# Patient Record
Sex: Male | Born: 1955 | Race: Black or African American | Hispanic: No | Marital: Single | Smoking: Former smoker
Health system: Southern US, Community
[De-identification: ages and names within clinical notes are randomized; demographics above are authoritative.]

## PROBLEM LIST (undated history)

## (undated) DIAGNOSIS — J189 Pneumonia, unspecified organism: Secondary | ICD-10-CM

## (undated) DIAGNOSIS — R0600 Dyspnea, unspecified: Secondary | ICD-10-CM

## (undated) DIAGNOSIS — I10 Essential (primary) hypertension: Secondary | ICD-10-CM

## (undated) DIAGNOSIS — C801 Malignant (primary) neoplasm, unspecified: Secondary | ICD-10-CM

## (undated) DIAGNOSIS — N189 Chronic kidney disease, unspecified: Secondary | ICD-10-CM

## (undated) DIAGNOSIS — M1990 Unspecified osteoarthritis, unspecified site: Secondary | ICD-10-CM

## (undated) HISTORY — PX: COLONOSCOPY W/ POLYPECTOMY: SHX1380

## (undated) HISTORY — PX: NASAL FRACTURE SURGERY: SHX718

---

## 2011-05-25 ENCOUNTER — Encounter (HOSPITAL_BASED_OUTPATIENT_CLINIC_OR_DEPARTMENT_OTHER): Payer: Self-pay | Admitting: *Deleted

## 2011-05-25 ENCOUNTER — Emergency Department (HOSPITAL_BASED_OUTPATIENT_CLINIC_OR_DEPARTMENT_OTHER)
Admission: EM | Admit: 2011-05-25 | Discharge: 2011-05-25 | Disposition: A | Discharge status: Home Health | Payer: 59 | Attending: Emergency Medicine | Admitting: Emergency Medicine

## 2011-05-25 ENCOUNTER — Emergency Department (INDEPENDENT_AMBULATORY_CARE_PROVIDER_SITE_OTHER): Payer: 59

## 2011-05-25 ENCOUNTER — Other Ambulatory Visit: Payer: Self-pay

## 2011-05-25 DIAGNOSIS — R079 Chest pain, unspecified: Secondary | ICD-10-CM

## 2011-05-25 DIAGNOSIS — I10 Essential (primary) hypertension: Secondary | ICD-10-CM | POA: Insufficient documentation

## 2011-05-25 DIAGNOSIS — E119 Type 2 diabetes mellitus without complications: Secondary | ICD-10-CM | POA: Insufficient documentation

## 2011-05-25 DIAGNOSIS — K029 Dental caries, unspecified: Secondary | ICD-10-CM | POA: Insufficient documentation

## 2011-05-25 DIAGNOSIS — M25519 Pain in unspecified shoulder: Secondary | ICD-10-CM | POA: Insufficient documentation

## 2011-05-25 DIAGNOSIS — Z79899 Other long term (current) drug therapy: Secondary | ICD-10-CM | POA: Insufficient documentation

## 2011-05-25 DIAGNOSIS — H11419 Vascular abnormalities of conjunctiva, unspecified eye: Secondary | ICD-10-CM | POA: Insufficient documentation

## 2011-05-25 HISTORY — DX: Essential (primary) hypertension: I10

## 2011-05-25 LAB — COMPREHENSIVE METABOLIC PANEL WITH GFR
ALT: 15 U/L (ref 0–53)
AST: 10 U/L (ref 0–37)
Albumin: 3.8 g/dL (ref 3.5–5.2)
Alkaline Phosphatase: 109 U/L (ref 39–117)
BUN: 10 mg/dL (ref 6–23)
CO2: 23 meq/L (ref 19–32)
Calcium: 9.5 mg/dL (ref 8.4–10.5)
Chloride: 100 meq/L (ref 96–112)
Creatinine, Ser: 0.9 mg/dL (ref 0.50–1.35)
GFR calc Af Amer: >90 mL/min
GFR calc non Af Amer: >90 mL/min
Glucose, Bld: 360 mg/dL — ABNORMAL HIGH (ref 70–99)
Potassium: 4.1 meq/L (ref 3.5–5.1)
Sodium: 134 meq/L — ABNORMAL LOW (ref 135–145)
Total Bilirubin: 0.3 mg/dL (ref 0.3–1.2)
Total Protein: 7 g/dL (ref 6.0–8.3)

## 2011-05-25 LAB — PROTIME-INR: INR: 0.91 (ref 0.00–1.49)

## 2011-05-25 LAB — CBC
HCT: 37.5 % — ABNORMAL LOW (ref 39.0–52.0)
Hemoglobin: 12.9 g/dL — ABNORMAL LOW (ref 13.0–17.0)
MCHC: 34.4 g/dL (ref 30.0–36.0)
MCV: 87.8 fL (ref 78.0–100.0)

## 2011-05-25 LAB — TROPONIN I: Troponin I: <0.3 ng/mL (ref <0.30)

## 2011-05-25 MED ORDER — LISINOPRIL 20 MG PO TABS: 10.0000 mg | ORAL_TABLET | Freq: Every day | ORAL | Status: DC

## 2011-05-25 MED ORDER — ASPIRIN 81 MG PO CHEW
324.0000 mg | CHEWABLE_TABLET | Freq: Once | ORAL | Status: AC
  Administered 2011-05-25: 324 mg via ORAL
  Filled 2011-05-25: qty 4

## 2011-05-25 MED ORDER — PENICILLIN V POTASSIUM 250 MG PO TABS
500.0000 mg | ORAL_TABLET | Freq: Four times a day (QID) | ORAL | Status: DC
  Administered 2011-05-25: 500 mg via ORAL
  Filled 2011-05-25: qty 2

## 2011-05-25 MED ORDER — SODIUM CHLORIDE 0.9 % IV BOLUS (SEPSIS)
1000.0000 mL | Freq: Once | INTRAVENOUS | Status: AC
  Administered 2011-05-25: 1000 mL via INTRAVENOUS

## 2011-05-25 MED ORDER — PENICILLIN V POTASSIUM 500 MG PO TABS: 500.0000 mg | ORAL_TABLET | Freq: Four times a day (QID) | ORAL | Status: AC

## 2011-05-25 MED ORDER — HYDROCODONE-ACETAMINOPHEN 5-325 MG PO TABS
2.0000 | ORAL_TABLET | Freq: Once | ORAL | Status: AC
  Administered 2011-05-25: 2 via ORAL
  Filled 2011-05-25: qty 2

## 2011-05-25 MED ORDER — IBUPROFEN 800 MG PO TABS
800.0000 mg | ORAL_TABLET | Freq: Once | ORAL | Status: AC
  Administered 2011-05-25: 800 mg via ORAL
  Filled 2011-05-25: qty 1

## 2011-05-25 MED ORDER — HYDROCODONE-ACETAMINOPHEN 5-325 MG PO TABS: 2.0000 | ORAL_TABLET | Freq: Four times a day (QID) | ORAL | Status: AC | PRN

## 2011-05-25 MED ORDER — METFORMIN HCL 500 MG PO TABS: 500.0000 mg | ORAL_TABLET | Freq: Every day | ORAL | Status: DC

## 2011-05-25 NOTE — ED Notes (Signed)
Patient states while on the way to work at 5:30 and started experiencing L sided chest pain, stayed in car lying down, has been taking ibuprofen for tooth pain and fell asleep in car and woke up with continuing pain, no cardiac Hx

## 2011-05-25 NOTE — Discharge Instructions (Signed)
Chest Pain (Nonspecific) It is often hard to give a specific diagnosis for the cause of chest pain. There is always a chance that your pain could be related to something serious, such as a heart attack or a blood clot in the lungs. You need to follow up with your caregiver for further evaluation. CAUSES   Heartburn.   Pneumonia or bronchitis.   Anxiety and stress.   Inflammation around your heart (pericarditis) or lung (pleuritis or pleurisy).   A blood clot in the lung.   A collapsed lung (pneumothorax). It can develop suddenly on its own (spontaneous pneumothorax) or from injury (trauma) to the chest.  The chest wall is composed of bones, muscles, and cartilage. Any of these can be the source of the pain.  The bones can be bruised by injury.   The muscles or cartilage can be strained by coughing or overwork.   The cartilage can be affected by inflammation and become sore (costochondritis).  DIAGNOSIS  Lab tests or other studies, such as X-rays, an EKG, stress testing, or cardiac imaging, may be needed to find the cause of your pain.  TREATMENT   Treatment depends on what may be causing your chest pain. Treatment may include:   Acid blockers for heartburn.   Anti-inflammatory medicine.   Pain medicine for inflammatory conditions.   Antibiotics if an infection is present.   You may be advised to change lifestyle habits. This includes stopping smoking and avoiding caffeine and chocolate.   You may be advised to keep your head raised (elevated) when sleeping. This reduces the chance of acid going backward from your stomach into your esophagus.   Most of the time, nonspecific chest pain will improve within 2 to 3 days with rest and mild pain medicine.  HOME CARE INSTRUCTIONS   If antibiotics were prescribed, take the full amount even if you start to feel better.   For the next few days, avoid physical activities that bring on chest pain. Continue physical activities as  directed.   Do not smoke cigarettes or drink alcohol until your symptoms are gone.   Only take over-the-counter or prescription medicine for pain, discomfort, or fever as directed by your caregiver.   Follow your caregiver's suggestions for further testing if your chest pain does not go away.   Keep any follow-up appointments you made. If you do not go to an appointment, you could develop lasting (chronic) problems with pain. If there is any problem keeping an appointment, you must call to reschedule.  SEEK MEDICAL CARE IF:   You think you are having problems from the medicine you are taking. Read your medicine instructions carefully.   Your chest pain does not go away, even after treatment.   You develop a rash with blisters on your chest.  SEEK IMMEDIATE MEDICAL CARE IF:   You have increased chest pain or pain that spreads to your arm, neck, jaw, back, or belly (abdomen).   You develop shortness of breath, an increasing cough, or you are coughing up blood.   You have severe back or abdominal pain, feel sick to your stomach (nauseous) or throw up (vomit).   You develop severe weakness, fainting, or chills.   You have an oral temperature above 102 F (38.9 C), not controlled by medicine.  THIS IS AN EMERGENCY. Do not wait to see if the pain will go away. Get medical help at once. Call your local emergency services (911 in [place] Do not drive yourself to   the hospital. MAKE SURE YOU:   Understand these instructions.   Will watch your condition.   Will get help right away if you are not doing well or get worse.  Document Released: 12/19/2004 Document Revised: 11/21/2010 Document Reviewed: 10/15/2007 [facility] Patient Information 2012 [place], [place].Dental Caries  Tooth decay (dental caries, cavities) is the most common of all oral diseases. It occurs in all ages but is more common in children and young adults.  CAUSES  Bacteria in your mouth combine with foods (particularly  sugars and starches) to produce plaque. Plaque is a substance that sticks to the hard surfaces of teeth. The bacteria in the plaque produce acids that attack the enamel of teeth. Repeated acid attacks dissolve the enamel and create holes in the teeth. Root surfaces of teeth may also get these holes.  Other contributing factors include:  Frequent snacking and drinking of cavity-producing foods and liquids.  Poor oral hygiene.  Dry mouth.  Substance abuse such as methamphetamine.  Broken or poor fitting dental restorations.  Eating disorders.  Gastroesophageal reflux disease (GERD).  Certain radiation treatments to the head and neck.  SYMPTOMS  At first, dental decay appears as white, chalky areas on the enamel. In this early stage, symptoms are seldom present. As the decay progresses, pits and holes may appear on the enamel surfaces. Progression of the decay will lead to softening of the hard layers of the tooth. At this point you may experience some pain or achy feeling after sweet, hot, or cold foods or drinks are consumed. If left untreated, the decay will reach the internal structures of the tooth and produce severe pain. Extensive dental treatment, such as root canal therapy, may be needed to save the tooth at this late stage of decay development.  DIAGNOSIS  Most cavities will be detected during regular check-ups. A thorough medical and dental history will be taken by the dentist. The dentist will use instruments to check the surfaces of your teeth for any breakdown or discoloration. Some dentists have special instruments, such as lasers, that detect tooth decay. Dental X-rays may also show some cavities that are not visible to the eye (such as between the contact areas of the teeth). TREATMENT  Treatment involves removal of the tooth decay and replacement with a restorative material such as silver, gold, or composite (white) material. However, if the decay involves a large area of the tooth and  there is little remaining healthy tooth structure, a cap (crown) will be fitted over the remaining structure. If the decay involves the center part of the tooth (pulp), root canal treatment will be needed before any type of dental restoration is placed. If the tooth is severely destroyed by the decay process, leaving the remaining tooth structures unrestorable, the tooth will need to be pulled (extracted). Some early tooth decay may be reversed by fluoride treatments and thorough brushing and flossing at home. PREVENTION  Eat healthy foods. Restrict the amount of sugary, starchy foods and liquids you consume. Avoid frequent snacking and drinking of unhealthy foods and liquids.  Sealants can help with prevention of cavities. Sealants are composite resins applied onto the biting surfaces of teeth at risk for decay. They smooth out the pits and grooves and prevent food from being trapped in them. This is done in early childhood before tooth decay has started.  Fluoride tablets may also be prescribed to children between 6 months and 29 years of age if your drinking water is not fluoridated. The fluoride absorbed  by the tooth enamel makes teeth less susceptible to decay. Thorough daily cleaning with a toothbrush and dental floss is the best way to prevent cavities. Use of a fluoride toothpaste is highly recommended. Fluoride mouth rinses may be used in specific cases.  Topical application of fluoride by your dentist is important in children.  Regular visits with a dentist for checkups and cleanings are also important.  SEEK IMMEDIATE DENTAL CARE IF: You have a fever.  You develop redness and swelling of your face, jaw, or neck.  You develop swelling around a tooth.  You are unable to open your mouth or cannot swallow.  You have severe pain uncontrolled by pain medicine.  Document Released: 12/01/2001 Document Revised: 11/21/2010 Document Reviewed: 08/16/2010 [facility] Patient Information 2012 [place],  [place].

## 2011-05-25 NOTE — ED Provider Notes (Signed)
History     CSN: [id number]  Arrival date & time 05/25/11  0820   First MD Initiated Contact with Patient 05/25/11 0830      Chief Complaint  Patient presents with  . Chest Pain    (Consider location/radiation/quality/duration/timing/severity/associated sxs/prior treatment) HPI Patient is a 56 year old male who presents today complaining of several seconds of left-sided chest pain. He also describes some left shoulder pain that is worsening and with arm movement. Patient has no pain currently but says when he has the pain in his chest a 10 out of 10. He has a history of diabetes and hypertension but no coronary artery disease. Patient also reports some history of family coronary artery disease but nothing before the age of 64. Patient denies any history of DVT or PE and has no significant risk factors for this. Patient denies any substance abuse including cocaine use. Patient does not have any alleviating or modifying factors of the pain in the left side of his chest. He was concerned about the should be checked out today. It began while he was driving to work. The patient reports that he took a nap in the parking lot at work. When he awoke he had another 2-3 second episode. There are no other associated or modifying factors. Past Medical History  Diagnosis Date  . Hypertension   . Diabetes mellitus     History reviewed. No pertinent past surgical history.  No family history on file.  History  Substance Use Topics  . Smoking status: Current Everyday Smoker  . Smokeless tobacco: Not on file  . Alcohol Use: Yes      Review of Systems  Constitutional: Negative.   HENT: Negative.   Eyes: Negative.   Respiratory: Negative.   Cardiovascular: Positive for chest pain.  Gastrointestinal: Negative.   Genitourinary: Negative.   Musculoskeletal:       Left shoulder pain  Skin: Negative.   Neurological: Negative.   Hematological: Negative.   Psychiatric/Behavioral: Negative.   All  other systems reviewed and are negative.    Allergies  Review of patient's allergies indicates no known allergies.  Home Medications   Current Outpatient Rx  Name Route Sig Dispense Refill  . LISINOPRIL 10 MG PO TABS Oral Take 10 mg by mouth daily.    Marland Kitchen METFORMIN HCL PO Oral Take 500 mg by mouth every evening.     Marland Kitchen UNKNOWN TO PATIENT  hypertension    . HYDROCODONE-ACETAMINOPHEN 5-325 MG PO TABS Oral Take 2 tablets by mouth every 6 (six) hours as needed for pain. 15 tablet 0  . LISINOPRIL 20 MG PO TABS Oral Take 0.5 tablets (10 mg total) by mouth daily. 30 tablet 0  . METFORMIN HCL 500 MG PO TABS Oral Take 1 tablet (500 mg total) by mouth daily with breakfast. 30 tablet 0  . PENICILLIN V POTASSIUM 500 MG PO TABS Oral Take 1 tablet (500 mg total) by mouth 4 (four) times daily. 28 tablet 0    BP 123/66  Pulse 68  Temp(Src) 97.8 F (36.6 C) (Oral)  Resp 18  SpO2 97%  Physical Exam  Nursing note and vitals reviewed. GEN: Well-developed, well-nourished male in no distress HEENT: Atraumatic, normocephalic. Oropharynx clear without erythema EYES: PERRLA BL, no scleral icterus. Scleral injection bilaterally NECK: Trachea midline, no meningismus CV: regular rate and rhythm. No murmurs, rubs, or gallops PULM: No respiratory distress.  No crackles, wheezes, or rales. GI: soft, non-tender. No guarding, rebound, or tenderness. + bowel sounds  Neuro: cranial nerves 2-12 intact, no abnormalities of strength or sensation, A and O x 3 MSK: Patient moves all 4 extremities symmetrically, no deformity, edema, or injury noted Psych: no abnormality of mood   ED Course  Procedures (including critical care time)    Date: 05/25/2011  Rate: 66  Rhythm: normal sinus rhythm  QRS Axis: normal  Intervals: normal  ST/T Wave abnormalities: normal  Conduction Disutrbances: none  Narrative Interpretation:   Old EKG Reviewed: No old EKG available     Labs Reviewed  COMPREHENSIVE METABOLIC  PANEL - Abnormal; Notable for the following:    Sodium 134 (*)    Glucose, Bld 360 (*)    All other components within normal limits  CBC - Abnormal; Notable for the following:    Hemoglobin 12.9 (*)    HCT 37.5 (*)    All other components within normal limits  PROTIME-INR  TROPONIN I  TROPONIN I   Dg Chest 2 View  05/25/2011  *RADIOLOGY REPORT*  Clinical Data: Left chest pain  CHEST - 2 VIEW  Comparison: None.  Findings: Lungs clear.  Heart size and pulmonary vascularity normal.  No effusion.  Visualized bones unremarkable.  IMPRESSION: No acute disease  Original Report Authenticated By: Thora Lance III, M.D.     1. Chest pain   2. Dental caries       MDM  Patient was evaluated by myself. Patient had atypical chest pain that lasted only for a few seconds. Patient does have a history of diabetes as well as hypertension. Patient did admit that he has not been following with her primary care physician and he has not been taking his medications as he should. He was slightly hyperglycemic. Patient received 1 L normal saline IV bolus for this. Patient's workup for ACS including CBC, renal panel, chest x-ray, EKG, and 0 and 3 hour troponins was unremarkable. Had no concern for thromboembolic disease today. Additionally patient did not have significant risk factors for this. Patient was given aspirin while his workup is completed. Patient also complained of dental pain. He did receive penicillin for this as well as 2 tabs of Vicodin. Patient was given the referring dentist name as well as instructions to call within 48 hours. Patient was also told that he needed to obtain a primary care physician and take his lisinopril and metformin as prescribed on a regular basis. I made no change in his regimen today as he has not been taking his medications regularly. He was given one month of prescriptions for both of these. He also was given a seven-day course of penicillin as well as 10 tabs of Vicodin  given his dental pain. Patient was discharged in good condition and told to return if he had any other concerning findings.        Cyndra Numbers, MD 05/25/11 1555

## 2015-09-20 ENCOUNTER — Emergency Department (HOSPITAL_BASED_OUTPATIENT_CLINIC_OR_DEPARTMENT_OTHER): Payer: Self-pay

## 2015-09-20 ENCOUNTER — Encounter (HOSPITAL_BASED_OUTPATIENT_CLINIC_OR_DEPARTMENT_OTHER): Payer: Self-pay

## 2015-09-20 ENCOUNTER — Emergency Department (HOSPITAL_BASED_OUTPATIENT_CLINIC_OR_DEPARTMENT_OTHER)
Admission: EM | Admit: 2015-09-20 | Discharge: 2015-09-20 | Disposition: A | Discharge status: Home / Self Care | Payer: Self-pay | Attending: Emergency Medicine | Admitting: Emergency Medicine

## 2015-09-20 DIAGNOSIS — W228XXA Striking against or struck by other objects, initial encounter: Secondary | ICD-10-CM | POA: Insufficient documentation

## 2015-09-20 DIAGNOSIS — Z79899 Other long term (current) drug therapy: Secondary | ICD-10-CM | POA: Insufficient documentation

## 2015-09-20 DIAGNOSIS — F17200 Nicotine dependence, unspecified, uncomplicated: Secondary | ICD-10-CM | POA: Insufficient documentation

## 2015-09-20 DIAGNOSIS — E119 Type 2 diabetes mellitus without complications: Secondary | ICD-10-CM | POA: Insufficient documentation

## 2015-09-20 DIAGNOSIS — I10 Essential (primary) hypertension: Secondary | ICD-10-CM | POA: Insufficient documentation

## 2015-09-20 DIAGNOSIS — Y929 Unspecified place or not applicable: Secondary | ICD-10-CM | POA: Insufficient documentation

## 2015-09-20 DIAGNOSIS — Y999 Unspecified external cause status: Secondary | ICD-10-CM | POA: Insufficient documentation

## 2015-09-20 DIAGNOSIS — Y9389 Activity, other specified: Secondary | ICD-10-CM | POA: Insufficient documentation

## 2015-09-20 DIAGNOSIS — S0591XA Unspecified injury of right eye and orbit, initial encounter: Secondary | ICD-10-CM | POA: Insufficient documentation

## 2015-09-20 MED ORDER — OXYCODONE-ACETAMINOPHEN 5-325 MG PO TABS: 1.0000 | ORAL_TABLET | ORAL | Status: DC | PRN

## 2015-09-20 MED ORDER — TETRACAINE HCL 0.5 % OP SOLN
1.0000 [drp] | Freq: Once | OPHTHALMIC | Status: AC
  Administered 2015-09-20: 1 [drp] via OPHTHALMIC
  Filled 2015-09-20: qty 4

## 2015-09-20 MED ORDER — FLUORESCEIN SODIUM 1 MG OP STRP
1.0000 | ORAL_STRIP | Freq: Once | OPHTHALMIC | Status: AC
  Administered 2015-09-20: 1 via OPHTHALMIC
  Filled 2015-09-20: qty 1

## 2015-09-20 MED ORDER — HYDROMORPHONE HCL 1 MG/ML IJ SOLN
1.0000 mg | Freq: Once | INTRAMUSCULAR | Status: AC
  Administered 2015-09-20: 1 mg via INTRAMUSCULAR
  Filled 2015-09-20: qty 1

## 2015-09-20 MED ORDER — ERYTHROMYCIN 5 MG/GM OP OINT
TOPICAL_OINTMENT | Freq: Four times a day (QID) | OPHTHALMIC | Status: DC
  Administered 2015-09-20: 1 via OPHTHALMIC
  Filled 2015-09-20: qty 3.5

## 2015-09-20 MED FILL — OXYCODONE/APAP 5-325: 5-325 | 2 days supply | Qty: 15 | Fill #0

## 2015-09-20 NOTE — ED Provider Notes (Signed)
CSN: [id number]     Arrival date & time 09/20/15  1405 History   First MD Initiated Contact with Patient 09/20/15 1445     Chief Complaint  Patient presents with  . Eye Injury   HPI   60 -year-old Travis [name] presents today with complaints of eye injury. Patient reports he was mowing the lawn when a rock came up and struck him in the right eye. He reports blurred vision, pain to the eye.. Patient wears glasses, no contacts. No known history of eye problems.  Past Medical History  Diagnosis Date  . Hypertension   . Diabetes mellitus    History reviewed. No pertinent past surgical history. No family history on file. Social History  Substance Use Topics  . Smoking status: Current Every Day Smoker  . Smokeless tobacco: None  . Alcohol Use: Yes     Comment: occ    Review of Systems  All other systems reviewed and are negative.  Allergies  Review of patient's allergies indicates no known allergies.  Home Medications   Prior to Admission medications   Medication Sig Start Date End Date Taking? Authorizing Provider  lisinopril (PRINIVIL,ZESTRIL) 20 MG tablet Take 0.5 tablets (10 mg total) by mouth daily. 05/25/11 05/24/12  Chauncy Passy, MD  metFORMIN (GLUCOPHAGE) 500 MG tablet Take 1 tablet (500 mg total) by mouth daily with breakfast. 05/25/11 05/24/12  Chauncy Passy, MD  oxyCODONE-acetaminophen (PERCOCET/ROXICET) 5-325 MG tablet Take 1 tablet by mouth every 4 (four) hours as needed for severe pain. 09/20/15   Emersen Mascari, PA-C   BP 145/78 mmHg  Pulse Travis  Temp(Src) 98.4 F (36.9 C) (Oral)  Resp 16  Ht '5\' 11"'$  (1.803 m)  Wt 79.833 kg  BMI 24.56 kg/m2  SpO2 94%   Physical Exam  Constitutional: He is oriented to person, place, and time. He appears well-developed and well-nourished. No distress.  HENT:  No peri-ocular swelling, redness, tenderness of warmth to touch  Eyes: Conjunctivae are normal. Pupils are equal, round, and reactive to light. Right eye exhibits no discharge. Left eye  exhibits no discharge. No scleral icterus.  General: No erythema, tearing, light sensitivity, proptosis ptosis Visual acuity: 20/40 left:  unable to read formal chart right  Extraocular movements: normal ROM pain free Confrontational visual fields: equal Pupils: right pupil fixed non reactive at 25m left 26mand reactive  Fluorescein: uptake noted with what appears to be migration of stain under conjunctiva  Intraocular pressure: 23 right, 21 left      Neck: Normal range of motion. Neck supple. No JVD present. No tracheal deviation present. No thyromegaly present.  Pulmonary/Chest: Effort normal. No stridor.  Lymphadenopathy:    He has no cervical adenopathy.  Neurological: He is alert and oriented to person, place, and time.  Skin: Skin is warm and dry. He is not diaphoretic.  Psychiatric: He has a normal mood and affect. His behavior is normal. Judgment and thought content normal.  Nursing note and vitals reviewed.        ED Course  Procedures (including critical care time) Labs Review Labs Reviewed - No data to display  Imaging Review Ct Orbitss W/o Cm  09/20/2015  CLINICAL DATA:  Hit in right eye with rock today. EXAM: CT ORBITS WITHOUT CONTRAST TECHNIQUE: Multidetector CT imaging of the orbits was performed following the standard protocol without intravenous contrast. COMPARISON:  None. FINDINGS: Orbital soft tissues are unremarkable. No radiopaque foreign body. Globe is intact. No fracture head no orbital or visualized facial fracture.  Slight mucosal thickening within the maxillary sinuses, scattered ethmoid air cells, and right frontal sinus. No air-fluid levels. Mastoid air cells are clear. Extensive dental caries partially imaged. Lucency adjacent to the right upper premolar concerning for periapical abscess. IMPRESSION: No radiopaque foreign body within the orbits. Globes are intact. No fracture. Extensive dental caries. Periapical abscess around the right upper premolar.  Electronically Signed   By: Rolm Baptise M.D.   On: 09/20/2015 15:49   I have personally reviewed and evaluated these images and lab results as part of my medical decision-making.   EKG Interpretation None      MDM   Final diagnoses:  Eye injury, right, initial encounter    Labs:  Imaging: CT Orbits  Consults: Dr. Posey Pronto  Therapeutics:Erythromycin  Discharge Meds: Percocet  Assessment/Plan:  60 year old Travis [name] presents today with injury to his right eye. Patient has obvious trauma to the eye, no open globe. CT scan shows no significant findings. Patient does have what appears to be conjunctival laceration as evidenced by spread of fluorescein. Patient's extraocular movements are intact, he does have a pupillary defect on the right. Nonreactive. Minimal tenderness surrounding soft tissue. Normal tensive ocular pressure. Dr. Posey Pronto with ophthalmology was consult at, he was aware of patient's pupil defect, trauma to the eye, concern for conjunctival laceration. Instructed to place erythromycin in the eye, eye patch, have patient follow-up tomorrow in his eye clinic. Patient was given pain medication which seemed to improve his symptoms, he will be discharged home with erythromycin, Percocet, eye patch, and encouraged to consult ophthalmology immediately. He is instructed to return to the emergency room immediately if any new or worsening signs or symptoms present or he is unable to follow-up with ophthalmology. Patient verbalizes understanding and agreement to today's plan had no further questions or concerns at time of discharge         Okey Regal, PA-C 09/20/15 1724  Harvel Quale, MD 09/23/15 [phone]

## 2015-09-20 NOTE — ED Notes (Signed)
Pt wears glasses but does not have them with him.

## 2015-09-20 NOTE — ED Notes (Signed)
MD at bedside. 

## 2015-09-20 NOTE — Discharge Instructions (Signed)
Please use pain medication as needed, contact ophthalmologist as soon as possible and schedule follow-up evaluation tomorrow. If you are unable to be seen please return to the emergency room for further evaluation. Please return immediately if new or worsening signs or symptoms present.

## 2015-09-20 NOTE — ED Notes (Signed)
Rock to right eye while mowing approx 1 hour PTA-right eye is tearing, states vision is blurred, difficulty opening eye-redness noted to sclera

## 2015-10-20 ENCOUNTER — Encounter (HOSPITAL_BASED_OUTPATIENT_CLINIC_OR_DEPARTMENT_OTHER): Payer: Self-pay | Admitting: *Deleted

## 2015-10-20 ENCOUNTER — Emergency Department (HOSPITAL_BASED_OUTPATIENT_CLINIC_OR_DEPARTMENT_OTHER)
Admission: EM | Admit: 2015-10-20 | Discharge: 2015-10-20 | Disposition: A | Discharge status: Home / Self Care | Payer: 59 | Attending: Emergency Medicine | Admitting: Emergency Medicine

## 2015-10-20 DIAGNOSIS — E1165 Type 2 diabetes mellitus with hyperglycemia: Secondary | ICD-10-CM | POA: Insufficient documentation

## 2015-10-20 DIAGNOSIS — Z7984 Long term (current) use of oral hypoglycemic drugs: Secondary | ICD-10-CM | POA: Insufficient documentation

## 2015-10-20 DIAGNOSIS — R739 Hyperglycemia, unspecified: Secondary | ICD-10-CM

## 2015-10-20 DIAGNOSIS — I10 Essential (primary) hypertension: Secondary | ICD-10-CM | POA: Insufficient documentation

## 2015-10-20 DIAGNOSIS — F17200 Nicotine dependence, unspecified, uncomplicated: Secondary | ICD-10-CM | POA: Insufficient documentation

## 2015-10-20 LAB — COMPREHENSIVE METABOLIC PANEL
ALBUMIN: 4.1 g/dL (ref 3.5–5.0)
ALT: 34 U/L (ref 17–63)
AST: 27 U/L (ref 15–41)
Alkaline Phosphatase: 101 U/L (ref 38–126)
Anion gap: 8 (ref 5–15)
BUN: 18 mg/dL (ref 6–20)
CHLORIDE: 103 mmol/L (ref 101–111)
CO2: 22 mmol/L (ref 22–32)
CREATININE: 1.17 mg/dL (ref 0.61–1.24)
Calcium: 9.5 mg/dL (ref 8.9–10.3)
GFR calc non Af Amer: >60 mL/min (ref >60)
GLUCOSE: 348 mg/dL — AB (ref 65–99)
Potassium: 3.9 mmol/L (ref 3.5–5.1)
SODIUM: 133 mmol/L — AB (ref 135–145)
Total Bilirubin: 0.7 mg/dL (ref 0.3–1.2)
Total Protein: 7 g/dL (ref 6.5–8.1)

## 2015-10-20 LAB — CBC WITH DIFFERENTIAL/PLATELET
Basophils Absolute: 0 10*3/uL (ref 0.0–0.1)
Basophils Relative: 1 %
EOS ABS: 0.2 10*3/uL (ref 0.0–0.7)
Eosinophils Relative: 3 %
HCT: 39.4 % (ref 39.0–52.0)
HEMOGLOBIN: 13.7 g/dL (ref 13.0–17.0)
LYMPHS ABS: 2.2 10*3/uL (ref 0.7–4.0)
Lymphocytes Relative: 34 %
MCH: 30.6 pg (ref 26.0–34.0)
MCHC: 34.8 g/dL (ref 30.0–36.0)
MCV: 88.1 fL (ref 78.0–100.0)
MONO ABS: 0.6 10*3/uL (ref 0.1–1.0)
MONOS PCT: 9 %
NEUTROS PCT: 53 %
Neutro Abs: 3.4 10*3/uL (ref 1.7–7.7)
Platelets: 190 10*3/uL (ref 150–400)
RBC: 4.47 MIL/uL (ref 4.22–5.81)
RDW: 11.7 % (ref 11.5–15.5)
WBC: 6.5 10*3/uL (ref 4.0–10.5)

## 2015-10-20 LAB — URINALYSIS, ROUTINE W REFLEX MICROSCOPIC
BILIRUBIN URINE: NEGATIVE
Glucose, UA: >1000 mg/dL — AB
HGB URINE DIPSTICK: NEGATIVE
Ketones, ur: NEGATIVE mg/dL
Leukocytes, UA: NEGATIVE
Nitrite: NEGATIVE
PH: 5.5 (ref 5.0–8.0)
Protein, ur: NEGATIVE mg/dL
SPECIFIC GRAVITY, URINE: 1.024 (ref 1.005–1.030)

## 2015-10-20 LAB — URINE MICROSCOPIC-ADD ON: RBC / HPF: NONE SEEN RBC/hpf (ref 0–5)

## 2015-10-20 LAB — CBG MONITORING, ED
GLUCOSE-CAPILLARY: 357 mg/dL — AB (ref 65–99)
GLUCOSE-CAPILLARY: 264 mg/dL — AB (ref 65–99)

## 2015-10-20 MED ORDER — SODIUM CHLORIDE 0.9 % IV BOLUS (SEPSIS)
1000.0000 mL | Freq: Once | INTRAVENOUS | Status: AC
  Administered 2015-10-20: 1000 mL via INTRAVENOUS

## 2015-10-20 MED ORDER — METFORMIN HCL 500 MG PO TABS: 500.0000 mg | ORAL_TABLET | Freq: Two times a day (BID) | ORAL | 1 refills | Status: DC

## 2015-10-20 MED FILL — metFORMIN HCL 500 MG TABS: 500 | 30 days supply | Qty: 60 | Fill #0

## 2015-10-20 NOTE — ED Triage Notes (Signed)
He has been out of diabetic medication for a year. For the past week he has had increased urination, lethargy and head pressure. He checked his blood sugar this am and it was 540.

## 2015-10-20 NOTE — ED Provider Notes (Signed)
[facility] Provider Note   CSN: [id number] Arrival date & time: 10/20/15  1253  First Provider Contact:  First MD Initiated Contact with Patient 10/20/15 1304        History   Chief Complaint Chief Complaint  Patient presents with  . Hyperglycemia    HPI Travis [name] is a 60 y.o. male.  The patient is a 60 year old male who is a diabetic, he is supposed to be taking oral medications but not having them in over one year. He reports that he stopped taking them for financial reasons. He states that over the last 3 weeks he has developed a general fatigue with a progressive urinary frequency and increased thirst. He has occasional right-sided abdominal discomfort but not at this time and it is mild at worst. He denies fevers, chills, nausea, vomiting but did have one episode of diarrhea last night. He checked his sugar at home, it was over 500, he brought himself to the emergency department for further evaluation. The symptoms are gradually progressive and have now become severe for him. He denies chest pain, shortness of breath or loss of consciousness      Past Medical History:  Diagnosis Date  . Diabetes mellitus   . Hypertension     There are no active problems to display for this patient.   History reviewed. No pertinent surgical history.     Home Medications    Prior to Admission medications   Medication Sig Start Date End Date Taking? Authorizing Provider  lisinopril (PRINIVIL,ZESTRIL) 20 MG tablet Take 0.5 tablets (10 mg total) by mouth daily. 05/25/11 05/24/12  Chauncy Passy, MD  metFORMIN (GLUCOPHAGE) 500 MG tablet Take 1 tablet (500 mg total) by mouth daily with breakfast. 05/25/11 05/24/12  Chauncy Passy, MD  oxyCODONE-acetaminophen (PERCOCET/ROXICET) 5-325 MG tablet Take 1 tablet by mouth every 4 (four) hours as needed for severe pain. 09/20/15   Okey Regal, PA-C    Family History No family history on file.  Social History Social History    Substance Use Topics  . Smoking status: Current Every Day Smoker  . Smokeless tobacco: Never Used  . Alcohol use Yes     Comment: occ     Allergies   Review of patient's allergies indicates no known allergies.   Review of Systems Review of Systems  All other systems reviewed and are negative.    Physical Exam Updated Vital Signs BP 145/81   Pulse 97   Temp 97.8 F (36.6 C) (Oral)   Resp 20   Ht '5\' 11"'$  (1.803 m)   Wt 174 lb (78.9 kg)   SpO2 97%   BMI 24.27 kg/m   Physical Exam  Constitutional: He appears well-developed and well-nourished. No distress.  HENT:  Head: Normocephalic and atraumatic.  Mouth/Throat: Oropharynx is clear and moist. No oropharyngeal exudate.  Eyes: Conjunctivae and EOM are normal. Pupils are equal, round, and reactive to light. Right eye exhibits no discharge. Left eye exhibits no discharge. No scleral icterus.  Neck: Normal range of motion. Neck supple. No JVD present. No thyromegaly present.  Cardiovascular: Normal rate, regular rhythm, normal heart sounds and intact distal pulses.  Exam reveals no gallop and no friction rub.   No murmur heard. Pulmonary/Chest: Effort normal and breath sounds normal. No respiratory distress. He has no wheezes. He has no rales.  Abdominal: Soft. Bowel sounds are normal. He exhibits no distension and no mass. There is no tenderness.  Musculoskeletal: Normal range of motion. He exhibits no  edema or tenderness.  Lymphadenopathy:    He has no cervical adenopathy.  Neurological: He is alert. Coordination normal.  Skin: Skin is warm and dry. No rash noted. No erythema.  Psychiatric: He has a normal mood and affect. His behavior is normal.  Nursing note and vitals reviewed.   ED Treatments / Results  Labs (all labs ordered are listed, but only abnormal results are displayed) Labs Reviewed  CBG MONITORING, ED - Abnormal; Notable for the following:       Result Value   Glucose-Capillary 357 (*)    All other  components within normal limits  COMPREHENSIVE METABOLIC PANEL  CBC WITH DIFFERENTIAL/PLATELET  URINALYSIS, ROUTINE W REFLEX MICROSCOPIC (NOT AT [facility])    Radiology No results found.  Procedures Procedures (including critical care time)  Medications Ordered in ED Medications  sodium chloride 0.9 % bolus 1,000 mL (not administered)  sodium chloride 0.9 % bolus 1,000 mL (1,000 mLs Intravenous New Bag/Given 10/20/15 1317)     Initial Impression / Assessment and Plan / ED Course  I have reviewed the triage vital signs and the nursing notes.  Pertinent labs & imaging results that were available during my care of the patient were reviewed by me and considered in my medical decision making (see chart for details).  Clinical Course  Comment By Time  Doneen Poisson, MD 07/28 1454  Pt informed of results Doing well No signs of AG acidosis or lactic acidosis.   VS remain normal.  CMP reassuring - restart metformin Noemi Chapel, MD 07/28 1455    The patient is nontoxic on exam though he does have an appearance of being mildly dehydrated. Check labs and a urinalysis to rule out DKA, otherwise hydrated and restart on medications. Check renal function prior to starting medications.  Final Clinical Impressions(s) / ED Diagnoses   Final diagnoses:  Hyperglycemia   New Prescriptions Current Discharge Medication List    Metformin '500mg'$  PO bid   Noemi Chapel, MD 10/20/15 1459

## 2015-10-20 NOTE — Discharge Instructions (Signed)
RESOURCE GUIDE  Chronic Pain Problems: Contact [facility]  [phone] Patients need to be referred by their primary care doctor.  Insufficient Money for Medicine: Contact [employer]:  call "211."   No Primary Care Doctor: Call [facility]  [phone] - can help you locate a primary care doctor that  accepts your insurance, provides certain services, etc. Physician Referral Service- [phone]  Agencies that provide inexpensive medical care: [facility]  [facility]  [phone] [facility]  [phone] [facility]  [phone] [facility]  [phone] [facility]  [phone]  [place] Providers: [facility]- [address], [address]  [phone], Mon-Fri 9am-7pm, Sat 9am-1pm [place], [address], [address] [place]  [place]- [address]  [place], [address], [phone]  Only accepts [place] [facility] [place] patients after they have their name  applied to their card  Self Pay (no insurance) in [facility]: Sickle Cell Patients: Dr Kevan Ny, [facility]  [place], [facility]- [place]  [facility]- [address], [facility]- see information above (Speak to [employer] if you do not have insurance)       -  [facility]- [place],  [place], [place]  Dr Vista Lawman-  [address], [place], [place], [place]       -  [facility] - [address] [phone]       -  [facility]- [address], [place], also [address], [phone]       -    [facility]- [address], [place], 1st & 3rd Saturday        every month, 10am-1pm  1) Find a Doctor and Pay Out of Pocket Although you won't have to find out who is covered by your insurance plan, it is a good idea to ask around and get recommendations. You will then need to call the office and see if the doctor you have chosen will accept you as a new patient and what types of options they offer for patients who are self-pay. Some doctors offer discounts or will set up payment plans for their patients who do not have insurance, but you will need to ask so you aren't surprised when you get to your appointment.  2) Contact Your Local Health Department Not all health departments have doctors that can see patients for sick visits, but many do, so it is worth a call to see if yours does. If you don't know where your local health department is, you can check in your phone book. The CDC also has a tool to help you locate your state's health department, and many state websites also have listings of all of their local health departments.  3) Find a [facility] If your illness is not likely to be very severe or complicated, you may want  to try a walk in clinic. These are popping up all over the country in pharmacies, drugstores, and shopping centers. They're usually staffed by nurse practitioners or physician assistants that have been trained to treat common illnesses and complaints. They're usually fairly quick and inexpensive. However, if you have serious medical issues or chronic medical problems, these are probably not your best option

## 2015-10-20 NOTE — ED Notes (Signed)
Pt has not taken his metformin due to financial reasons. Pt reports increased thirst and urination. Denies pain, denies N/v

## 2015-12-14 MED FILL — metFORMIN HCL 500 MG TABS: 500 | 30 days supply | Qty: 60 | Fill #1

## 2018-03-31 ENCOUNTER — Encounter (HOSPITAL_BASED_OUTPATIENT_CLINIC_OR_DEPARTMENT_OTHER): Payer: Self-pay | Admitting: Emergency Medicine

## 2018-03-31 ENCOUNTER — Other Ambulatory Visit: Payer: Self-pay

## 2018-03-31 ENCOUNTER — Emergency Department (HOSPITAL_BASED_OUTPATIENT_CLINIC_OR_DEPARTMENT_OTHER): Payer: BLUE CROSS/BLUE SHIELD

## 2018-03-31 ENCOUNTER — Emergency Department (HOSPITAL_BASED_OUTPATIENT_CLINIC_OR_DEPARTMENT_OTHER)
Admission: EM | Admit: 2018-03-31 | Discharge: 2018-03-31 | Disposition: A | Discharge status: Home / Self Care | Payer: BLUE CROSS/BLUE SHIELD | Attending: Emergency Medicine | Admitting: Emergency Medicine

## 2018-03-31 DIAGNOSIS — I10 Essential (primary) hypertension: Secondary | ICD-10-CM | POA: Insufficient documentation

## 2018-03-31 DIAGNOSIS — E119 Type 2 diabetes mellitus without complications: Secondary | ICD-10-CM | POA: Insufficient documentation

## 2018-03-31 DIAGNOSIS — R634 Abnormal weight loss: Secondary | ICD-10-CM | POA: Insufficient documentation

## 2018-03-31 DIAGNOSIS — F17210 Nicotine dependence, cigarettes, uncomplicated: Secondary | ICD-10-CM | POA: Insufficient documentation

## 2018-03-31 DIAGNOSIS — R918 Other nonspecific abnormal finding of lung field: Secondary | ICD-10-CM | POA: Insufficient documentation

## 2018-03-31 DIAGNOSIS — Z79899 Other long term (current) drug therapy: Secondary | ICD-10-CM | POA: Insufficient documentation

## 2018-03-31 DIAGNOSIS — E860 Dehydration: Secondary | ICD-10-CM | POA: Insufficient documentation

## 2018-03-31 HISTORY — DX: Unspecified osteoarthritis, unspecified site: M19.90

## 2018-03-31 LAB — COMPREHENSIVE METABOLIC PANEL
ALBUMIN: 3.6 g/dL (ref 3.5–5.0)
ALT: 13 U/L (ref 0–44)
ANION GAP: 8 (ref 5–15)
AST: 16 U/L (ref 15–41)
Alkaline Phosphatase: 48 U/L (ref 38–126)
BUN: 10 mg/dL (ref 6–20)
CO2: 24 mmol/L (ref 22–32)
Calcium: 9.2 mg/dL (ref 8.9–10.3)
Chloride: 106 mmol/L (ref 98–111)
Creatinine, Ser: 0.92 mg/dL (ref 0.61–1.24)
GFR calc Af Amer: >60 mL/min (ref >60)
GFR calc non Af Amer: >60 mL/min (ref >60)
GLUCOSE: 250 mg/dL — AB (ref 70–99)
POTASSIUM: 3.5 mmol/L (ref 3.5–5.1)
SODIUM: 138 mmol/L (ref 135–145)
Total Bilirubin: 0.7 mg/dL (ref 0.3–1.2)
Total Protein: 6 g/dL — ABNORMAL LOW (ref 6.5–8.1)

## 2018-03-31 LAB — CBC WITH DIFFERENTIAL/PLATELET
Abs Immature Granulocytes: 0.01 10*3/uL (ref 0.00–0.07)
BASOS ABS: 0.1 10*3/uL (ref 0.0–0.1)
BASOS PCT: 1 %
EOS ABS: 0.2 10*3/uL (ref 0.0–0.5)
EOS PCT: 3 %
HCT: 37.5 % — ABNORMAL LOW (ref 39.0–52.0)
HEMOGLOBIN: 12.2 g/dL — AB (ref 13.0–17.0)
Immature Granulocytes: 0 %
LYMPHS PCT: 30 %
Lymphs Abs: 2 10*3/uL (ref 0.7–4.0)
MCH: 30.7 pg (ref 26.0–34.0)
MCHC: 32.5 g/dL (ref 30.0–36.0)
MCV: 94.5 fL (ref 80.0–100.0)
Monocytes Absolute: 0.5 10*3/uL (ref 0.1–1.0)
Monocytes Relative: 8 %
NRBC: 0 % (ref 0.0–0.2)
Neutro Abs: 3.9 10*3/uL (ref 1.7–7.7)
Neutrophils Relative %: 58 %
PLATELETS: 204 10*3/uL (ref 150–400)
RBC: 3.97 MIL/uL — AB (ref 4.22–5.81)
RDW: 11.8 % (ref 11.5–15.5)
WBC: 6.7 10*3/uL (ref 4.0–10.5)

## 2018-03-31 LAB — TROPONIN I: Troponin I: <0.03 ng/mL (ref <0.03)

## 2018-03-31 LAB — LIPASE, BLOOD: Lipase: 32 U/L (ref 11–51)

## 2018-03-31 MED ORDER — SODIUM CHLORIDE 0.9 % IV BOLUS
1000.0000 mL | Freq: Once | INTRAVENOUS | Status: AC
  Administered 2018-03-31: 1000 mL via INTRAVENOUS

## 2018-03-31 MED ORDER — IOPAMIDOL (ISOVUE-300) INJECTION 61%
100.0000 mL | Freq: Once | INTRAVENOUS | Status: AC | PRN
  Administered 2018-03-31: 100 mL via INTRAVENOUS

## 2018-03-31 NOTE — ED Notes (Signed)
Pt left without CD. LM for pt to call regarding disc Pt returned call and returning to pick up disc.

## 2018-03-31 NOTE — ED Triage Notes (Signed)
Pt reports he woke up today he was dizzy and had high BP when he checked it. He went to work where the dizziness worsened and he developed pain in his R shoulder and SOB

## 2018-03-31 NOTE — Discharge Instructions (Signed)
It is important that you follow-up with your primary care physician on Friday as scheduled and discussed the results of your CT scan which are concerning for primary lung cancer  It is important that you follow-up with an oncologist as well as a pulmonologist.

## 2018-03-31 NOTE — ED Notes (Signed)
Pt on monitor 

## 2018-03-31 NOTE — ED Provider Notes (Signed)
[facility] EMERGENCY DEPARTMENT Provider Note   CSN: [id number] Arrival date & time: 03/31/18  1108     History   Chief Complaint Chief Complaint  Patient presents with  . Dizziness    HPI Travis [name] is a 63 y.o. male.  HPI 63 year old male presents to the emergency department with complaints of lightheadedness and dizziness.  He checked his blood pressure and found it to be elevated.  He reports some shortness of breath and cough.  He has had anorexia for several months and unintentional weight loss.  No history of cancer.  No chest pain.  Denies abdominal pain.  Denies nausea vomiting diarrhea.  No fevers or chills.  Denies productive cough.  He does smoke cigarettes.  Symptoms are mild to moderate in severity.   Past Medical History:  Diagnosis Date  . Arthritis   . Diabetes mellitus   . Hypertension     There are no active problems to display for this patient.   History reviewed. No pertinent surgical history.      Home Medications    Prior to Admission medications   Medication Sig Start Date End Date Taking? Authorizing Provider  lisinopril (PRINIVIL,ZESTRIL) 20 MG tablet Take 0.5 tablets (10 mg total) by mouth daily. 05/25/11 05/24/12  Chauncy Passy, MD  metFORMIN (GLUCOPHAGE) 500 MG tablet Take 1 tablet (500 mg total) by mouth 2 (two) times daily with a meal. 10/20/15 12/19/15  Noemi Chapel, MD  oxyCODONE-acetaminophen (PERCOCET/ROXICET) 5-325 MG tablet Take 1 tablet by mouth every 4 (four) hours as needed for severe pain. 09/20/15   Okey Regal, PA-C    Family History No family history on file.  Social History Social History   Tobacco Use  . Smoking status: Current Every Day Smoker  . Smokeless tobacco: Never Used  Substance Use Topics  . Alcohol use: Yes    Comment: occ  . Drug use: No     Allergies   Patient has no known allergies.   Review of Systems Review of Systems  All other systems reviewed and are  negative.    Physical Exam Updated Vital Signs BP (!) 170/96   Pulse 88   Temp 97.9 F (36.6 C) (Oral)   Resp (!) 24   Ht 5\' 11"  (1.803 m)   Wt 72.6 kg   SpO2 100%   BMI 22.32 kg/m   Physical Exam Vitals signs and nursing note reviewed.  Constitutional:      Appearance: He is well-developed.  HENT:     Head: Normocephalic and atraumatic.  Neck:     Musculoskeletal: Normal range of motion.  Cardiovascular:     Rate and Rhythm: Normal rate and regular rhythm.     Heart sounds: Normal heart sounds.  Pulmonary:     Effort: Pulmonary effort is normal. No respiratory distress.     Breath sounds: Normal breath sounds.  Abdominal:     General: There is no distension.     Palpations: Abdomen is soft.     Tenderness: There is no abdominal tenderness.  Musculoskeletal: Normal range of motion.  Skin:    General: Skin is warm and dry.  Neurological:     Mental Status: He is alert and oriented to person, place, and time.  Psychiatric:        Judgment: Judgment normal.      ED Treatments / Results  Labs (all labs ordered are listed, but only abnormal results are displayed) Labs Reviewed  CBC WITH DIFFERENTIAL/PLATELET - Abnormal;  Notable for the following components:      Result Value   RBC 3.97 (*)    Hemoglobin 12.2 (*)    HCT 37.5 (*)    All other components within normal limits  COMPREHENSIVE METABOLIC PANEL - Abnormal; Notable for the following components:   Glucose, Bld 250 (*)    Total Protein 6.0 (*)    All other components within normal limits  LIPASE, BLOOD  TROPONIN I    EKG None  Radiology Dg Chest 2 View  Result Date: 03/31/2018 CLINICAL DATA:  Cough and short of breath EXAM: CHEST - 2 VIEW COMPARISON:  04/20/2016 FINDINGS: Patchy 3 cm right perihilar airspace density is new. Left lung is clear. No effusion. Heart size and vascularity normal. IMPRESSION: New right perihilar density. Probable pneumonia however mass lesion is possible. Followup PA and  lateral chest X-ray is recommended in 3-4 weeks following trial of antibiotic therapy to ensure resolution and exclude underlying malignancy. Electronically Signed   By: Franchot Gallo M.D.   On: 03/31/2018 13:01   Ct Chest W Contrast  Result Date: 03/31/2018 CLINICAL DATA:  Weight loss unintended, abdominal pain, anorexia, some dizziness and high blood pressure today, right shoulder pain EXAM: CT CHEST, ABDOMEN, AND PELVIS WITH CONTRAST TECHNIQUE: Multidetector CT imaging of the chest, abdomen and pelvis was performed following the standard protocol during bolus administration of intravenous contrast. CONTRAST:  15mL ISOVUE-300 IOPAMIDOL (ISOVUE-300) INJECTION 61% COMPARISON:  Chest x-ray of 03/31/2018 FINDINGS: CT CHEST FINDINGS Cardiovascular: Moderate thoracic aortic atherosclerosis is present. The heart is mildly enlarged. No significant pericardial effusion is seen. There is good opacification of the pulmonary arteries and no evidence of embolism is noted. The thoracic aorta also is well opacified with no acute abnormality noted. The mid ascending thoracic aorta measures 33 mm in diameter. Mediastinum/Nodes: There may be right hilar node on image 36 series 2 measuring 10 mm in diameter. No other definite mediastinal or hilar adenopathy is seen with a small precarinal node of 7 mm. Only small mediastinal lymph nodes are present. The thyroid gland is unremarkable. No abnormality of the esophagus is seen by CT. Lungs/Pleura: On lung window images, there is and irregularly marginated solid-appearing mass within the right upper lobe extending toward the hilum. This mass appears to cross the major fissure into the right upper lobe posterior medially. The bronchus to the superior segment of the right lower lobe is occluded presumably by tumor. This lesion measures 4.2 x 3.3 cm with height of approximate 3.1 cm. In addition, there are multiple scattered lung nodules primarily throughout the right lung. The largest  nodule is in the superior segment of the right lower lobe as well on image 97 series 6 measuring 9 mm in diameter. Additional nodules are scattered primarily throughout the right lung a between 3 and 7 mm. Two nodules are present within the left lung which appear pleural based of 3 mm in diameter on image 134 and 3 mm in diameter on image 143 both in the left lower lobe. A nodular opacity adjacent to the major fissure on the left on image 119 probably represents perifissural lymph node. Musculoskeletal: There are degenerative changes in the lower cervical, upper thoracic, and lower thoracic spine. No lytic or blastic bone lesion is seen. The sternum is unremarkable other than degenerative change. CT ABDOMEN PELVIS FINDINGS Hepatobiliary: The liver enhances with no focal abnormality noted. No ductal dilatation is seen. Gallbladder is somewhat contracted and no calcified gallstones are seen. Pancreas: The pancreas  is normal in size in the pancreatic duct is not dilated. Spleen: The spleen is somewhat inhomogeneous in enhancement, but no suspicious focal abnormality is noted. Adrenals/Urinary Tract: The adrenal glands appear normal. The kidneys enhance with no calculus or mass. No hydronephrosis is seen on delayed images. The pelvocaliceal systems are unremarkable. The ureters appear normal in caliber. The urinary bladder is moderately urine distended with no abnormality noted. Stomach/Bowel: The stomach is distended with fluid and food debris. No abnormality is seen. No distention of small bowel is seen and no edema is noted. No abnormality of the colon is seen. The terminal ileum and the appendix are unremarkable. Vascular/Lymphatic: Moderate abdominal aortic atherosclerosis is noted. No adenopathy is seen. Reproductive: The prostate is normal in size. Other: No abdominal wall hernia is noted, other than a tiny umbilical hernia containing only fat. Musculoskeletal: The lumbar vertebrae are in normal alignment with  mild degenerative change. There is degenerative change involve the facet joints of L4-5 and L5-S1. No lytic or blastic bony lesion is seen. The SI joints appear corticated IMPRESSION: 1. 4.2 x 3.3 x 3.1 cm irregularly marginated mass within the superior segment of the right lower lobe appearing to extend across the fissure into the posterior inferior right upper lobe and extending toward the right hilum where the right upper lobe superior segment bronchus is occluded, consistent with primary lung carcinoma. 2. Small noncalcified nodules more numerous throughout the right lung most consistent with lung metastases. 3. No metastatic involvement of the abdomen or pelvis by CT. 4. Moderate thoracic and abdominal aortic atherosclerosis. Electronically Signed   By: Ivar Drape M.D.   On: 03/31/2018 14:16   Ct Abdomen Pelvis W Contrast  Result Date: 03/31/2018 CLINICAL DATA:  Weight loss unintended, abdominal pain, anorexia, some dizziness and high blood pressure today, right shoulder pain EXAM: CT CHEST, ABDOMEN, AND PELVIS WITH CONTRAST TECHNIQUE: Multidetector CT imaging of the chest, abdomen and pelvis was performed following the standard protocol during bolus administration of intravenous contrast. CONTRAST:  14mL ISOVUE-300 IOPAMIDOL (ISOVUE-300) INJECTION 61% COMPARISON:  Chest x-ray of 03/31/2018 FINDINGS: CT CHEST FINDINGS Cardiovascular: Moderate thoracic aortic atherosclerosis is present. The heart is mildly enlarged. No significant pericardial effusion is seen. There is good opacification of the pulmonary arteries and no evidence of embolism is noted. The thoracic aorta also is well opacified with no acute abnormality noted. The mid ascending thoracic aorta measures 33 mm in diameter. Mediastinum/Nodes: There may be right hilar node on image 36 series 2 measuring 10 mm in diameter. No other definite mediastinal or hilar adenopathy is seen with a small precarinal node of 7 mm. Only small mediastinal lymph  nodes are present. The thyroid gland is unremarkable. No abnormality of the esophagus is seen by CT. Lungs/Pleura: On lung window images, there is and irregularly marginated solid-appearing mass within the right upper lobe extending toward the hilum. This mass appears to cross the major fissure into the right upper lobe posterior medially. The bronchus to the superior segment of the right lower lobe is occluded presumably by tumor. This lesion measures 4.2 x 3.3 cm with height of approximate 3.1 cm. In addition, there are multiple scattered lung nodules primarily throughout the right lung. The largest nodule is in the superior segment of the right lower lobe as well on image 97 series 6 measuring 9 mm in diameter. Additional nodules are scattered primarily throughout the right lung a between 3 and 7 mm. Two nodules are present within the left lung which  appear pleural based of 3 mm in diameter on image 134 and 3 mm in diameter on image 143 both in the left lower lobe. A nodular opacity adjacent to the major fissure on the left on image 119 probably represents perifissural lymph node. Musculoskeletal: There are degenerative changes in the lower cervical, upper thoracic, and lower thoracic spine. No lytic or blastic bone lesion is seen. The sternum is unremarkable other than degenerative change. CT ABDOMEN PELVIS FINDINGS Hepatobiliary: The liver enhances with no focal abnormality noted. No ductal dilatation is seen. Gallbladder is somewhat contracted and no calcified gallstones are seen. Pancreas: The pancreas is normal in size in the pancreatic duct is not dilated. Spleen: The spleen is somewhat inhomogeneous in enhancement, but no suspicious focal abnormality is noted. Adrenals/Urinary Tract: The adrenal glands appear normal. The kidneys enhance with no calculus or mass. No hydronephrosis is seen on delayed images. The pelvocaliceal systems are unremarkable. The ureters appear normal in caliber. The urinary bladder  is moderately urine distended with no abnormality noted. Stomach/Bowel: The stomach is distended with fluid and food debris. No abnormality is seen. No distention of small bowel is seen and no edema is noted. No abnormality of the colon is seen. The terminal ileum and the appendix are unremarkable. Vascular/Lymphatic: Moderate abdominal aortic atherosclerosis is noted. No adenopathy is seen. Reproductive: The prostate is normal in size. Other: No abdominal wall hernia is noted, other than a tiny umbilical hernia containing only fat. Musculoskeletal: The lumbar vertebrae are in normal alignment with mild degenerative change. There is degenerative change involve the facet joints of L4-5 and L5-S1. No lytic or blastic bony lesion is seen. The SI joints appear corticated IMPRESSION: 1. 4.2 x 3.3 x 3.1 cm irregularly marginated mass within the superior segment of the right lower lobe appearing to extend across the fissure into the posterior inferior right upper lobe and extending toward the right hilum where the right upper lobe superior segment bronchus is occluded, consistent with primary lung carcinoma. 2. Small noncalcified nodules more numerous throughout the right lung most consistent with lung metastases. 3. No metastatic involvement of the abdomen or pelvis by CT. 4. Moderate thoracic and abdominal aortic atherosclerosis. Electronically Signed   By: Ivar Drape M.D.   On: 03/31/2018 14:16    Procedures Procedures (including critical care time)  Medications Ordered in ED Medications  sodium chloride 0.9 % bolus 1,000 mL ( Intravenous Stopped 03/31/18 1453)  iopamidol (ISOVUE-300) 61 % injection 100 mL (100 mLs Intravenous Contrast Given 03/31/18 1336)     Initial Impression / Assessment and Plan / ED Course  I have reviewed the triage vital signs and the nursing notes.  Pertinent labs & imaging results that were available during my care of the patient were reviewed by me and considered in my medical  decision making (see chart for details).     Unintentional weight loss.  Initial x-ray concerning for middle lobe infiltrate versus mass.  CT chest abdomen pelvis demonstrates mass in the right lung concerning for primary lung cancer.  Patient will need pulmonary and oncology follow-up.  He is scheduled to see his primary care physician on Friday.  He has been given a copy of his radiology report as well as the CT scans to follow-up with his primary care physician.  His primary care physician is free to refer him in the normal pathway but if this does not seem to be working well he has been given the pulmonary and oncology numbers within  the [facility] health system.  All questions answered.  Attempted to contact wife but she is not answering the phone at this time.  He was given a voice message which I recorded on his phone so that he could play this for his wife at a later time.  Patient encouraged to return the emergency department for new or worsening symptoms.  Heart rate improving with IV fluids.  Final Clinical Impressions(s) / ED Diagnoses   Final diagnoses:  None    ED Discharge Orders    None       Jola Schmidt, MD 03/31/18 1511

## 2018-03-31 NOTE — ED Notes (Signed)
Pt lying down for 48mins then starting orthostatics

## 2018-04-01 ENCOUNTER — Telehealth: Payer: Self-pay | Admitting: Hematology and Oncology

## 2018-04-01 NOTE — Telephone Encounter (Signed)
Cld and lft the pt to see if he would like to establish care at [facility] or [facility] [place] location.

## 2018-04-02 ENCOUNTER — Telehealth: Payer: Self-pay | Admitting: Hematology and Oncology

## 2018-04-02 NOTE — Telephone Encounter (Signed)
Cld and spoke to the pt. He prefers to be seen at the [place] location. Email sent to [facility] and [facility] to get the pt scheduled.

## 2018-04-03 ENCOUNTER — Telehealth: Payer: Self-pay

## 2018-04-03 ENCOUNTER — Other Ambulatory Visit: Payer: Self-pay | Admitting: Hematology

## 2018-04-03 ENCOUNTER — Encounter: Payer: Self-pay | Admitting: *Deleted

## 2018-04-03 ENCOUNTER — Telehealth: Payer: Self-pay | Admitting: Hematology

## 2018-04-03 DIAGNOSIS — R911 Solitary pulmonary nodule: Secondary | ICD-10-CM

## 2018-04-03 DIAGNOSIS — C3490 Malignant neoplasm of unspecified part of unspecified bronchus or lung: Secondary | ICD-10-CM | POA: Insufficient documentation

## 2018-04-03 DIAGNOSIS — R918 Other nonspecific abnormal finding of lung field: Secondary | ICD-10-CM

## 2018-04-03 NOTE — Telephone Encounter (Signed)
lmtcb X1 for pt.  Please see message below: pt needs to be seen next available with either Gordy Savers, [facility], Ander Slade, or [facility] for evaluation of bronchoscopy.    Per schedule, AO has next availability, as early as 04/08/2018.  BQ is booked through the end of February in [facility] office.    Need to schedule consult visit with any of the above providers when pt calls back.

## 2018-04-03 NOTE — Telephone Encounter (Signed)
Spoke with patient regarding upcoming appointments date/time/location was given along with my name & phone number per 1/9 staff message

## 2018-04-03 NOTE — Progress Notes (Signed)
Reached out to [employer] to introduce myself as the office RN Navigator and explain our new patient process. Reviewed the reason for their referral and scheduled their new patient appointment along with labs. Provided address and directions to the office including call back phone number. Reviewed with patient any concerns they may have or any possible barriers to attending their appointment.   Informed patient that Dr Maylon Peppers has placed orders for a PET scan and the Pulmonology referral. He was at work and unable to take details about the appointment, PET or referral. He asks to be called later this afternoon when he has an opportunity to write everything. The scheduler will call to provide all this info.   This scan is scheduled for 04/14/2018 at 0630a. He needs to be NPO after midnight and hold his morning metformin. He understands instructions, location and time. This will be provided to him at his appointment next week. Dr Maylon Peppers has reached out to Pulmonary to see patient one day next week. Patient is aware that [facility] will be reaching out to him to schedule.   Informed patient about my role as a navigator and that I will meet with them prior to their New Patient appointment and more fully discuss what services I can provide. At this time patient has no further questions or needs.

## 2018-04-03 NOTE — Telephone Encounter (Signed)
-----   Message from Juanito Doom, MD sent at 04/03/2018  9:01 AM EST ----- Hi,  Oncology contacted me about this patient.  Can we get him in to be seen soon?  Preferably with someone who can arrange a bronchoscopy soon ([facility], [facility], Huston Foley, me)  I can see in [facility] if needed.  Ruby Cola

## 2018-04-06 ENCOUNTER — Other Ambulatory Visit: Payer: Self-pay | Admitting: Hematology

## 2018-04-06 DIAGNOSIS — C3491 Malignant neoplasm of unspecified part of right bronchus or lung: Secondary | ICD-10-CM

## 2018-04-06 DIAGNOSIS — Z720 Tobacco use: Secondary | ICD-10-CM | POA: Insufficient documentation

## 2018-04-06 DIAGNOSIS — D6481 Anemia due to antineoplastic chemotherapy: Secondary | ICD-10-CM | POA: Insufficient documentation

## 2018-04-06 DIAGNOSIS — T451X5A Adverse effect of antineoplastic and immunosuppressive drugs, initial encounter: Secondary | ICD-10-CM | POA: Insufficient documentation

## 2018-04-06 DIAGNOSIS — D649 Anemia, unspecified: Secondary | ICD-10-CM

## 2018-04-06 NOTE — Telephone Encounter (Signed)
Rec'd referral in [facility] and [facility] from Dr. Maylon Peppers. Called and spoke to patient - pt wanted to be seen in [facility]. First available is with Dr. Halford Chessman, on 04/30/2018 - This was an urgent referral in [facility].Can patient be worked in - he prefers [facility] and afternoon - he can be reached at [phone]. -pr

## 2018-04-06 NOTE — Progress Notes (Signed)
[facility] CONSULT NOTE  Patient Care Team: Patient, No Pcp Per as PCP - General (General Practice)  HEME/ONC OVERVIEW: 1. Suspected cancer of the right lung -03/2018: CT CAP showed a 4.2 x 3.3 x 3.1cm irregularly marginated mass within the superior segment of RLL extending across figure into the posterior inferior RUL and extending toward the R hilum, occluding the RUL superior segment bronchus, concerning for primary lung cancer; additional small noncalcified nodules throughout the right lung, c/w lung metastases; no metastatic disease in the abdomen/pelvis   ASSESSMENT & PLAN:   Suspected cancer of the right lung -I reviewed the patient's records in detail, including ER notes, lab results and imaging studies -I also independently reviewed the radiologic images of recent CT CAP, and agree with the findings as documented -In summary, patient presented to ER for a constellation of symptoms, including dyspnea, cough, unintentional weight loss, and decreased appetite in early 03/2018.  CT CAP showed a dominant RLL mass approximately 4 cm and additional multiple nodules throughout the right lung, consistent with lung metastases.  There was no evidence of distant metastatic disease. -I have ordered PET scan to assess for evidence of metastatic disease, currently scheduled on 04/14/2018 -Given the high clinical suspicion for lung cancer, I have also ordered MRI brain to rule out brain metastasis -In addition, I discussed the case with the pulmonary medicine, who will evaluated patient later today for bronchoscopy   -Once the diagnostic and staging work-up is complete, we will be able to discuss treatment options and prognosis  Recent normocytic anemia -Hgb 12.2 in 03/2018, possibly due to anemia of chronic disease -Hgb 13.1 today; iron profile pending -I encouraged the patient to discuss with his PCP regarding age-appropriate cancer screening, including colonoscopy  AKI -Cr 1.42  today, up from 0.9 one week ago -Possibly due to dehydration vs. Medications -I have discontinued lisinopril due to AKI and hyperkalemia -I counseled the patient on the importance of maintaining adequate hydration and avoiding nephrotoxic medications, including NSAIDs -We will repeat labs in 2 weeks to monitor renal function  Hyperkalemia -K 5.2 today, patient is asymptomatic -Lisinopril discontinued as above -I encouraged the patient to contact his PCP to make him/her aware of the medication discontinuation so that they can consider switching to another antihypertensive medication -I also counseled the patient on avoiding food that's high in potassium  Presyncope -Patient reports light-headedness with body position change, possibly orthostatic hypotension -Lisinopril discontinued  -No focal neurologic deficits -MRI brain as above to rule out intracranial abnormalities   Tobacco use -Patient reports that he quit tobacco use in the past week -I congratulated the patient on quitting tobacco, and counseled him on the importance of abstinence from tobacco products   Orders Placed This Encounter  Procedures  . MR BRAIN W WO CONTRAST    Standing Status:   Future    Standing Expiration Date:   04/09/2019    Order Specific Question:   If indicated for the ordered procedure, I authorize the administration of contrast media per Radiology protocol    Answer:   Yes    Order Specific Question:   What is the patient's sedation requirement?    Answer:   No Sedation    Order Specific Question:   Does the patient have a pacemaker or implanted devices?    Answer:   No    Order Specific Question:   Use SRS Protocol?    Answer:   No    Order Specific Question:  Radiology Contrast Protocol - do NOT remove file path    Answer:   [email or web address]    Order Specific Question:   Preferred imaging location?    Answer:   [facility] (table limit-350 lbs)   All  questions were answered. The patient knows to call the clinic with any problems, questions or concerns.  Return in 2 weeks for labs and clinic follow-up.   Tish Men, MD 04/09/2018 1:01 PM   CHIEF COMPLAINTS/PURPOSE OF CONSULTATION:  "I am here to find out what next"  HISTORY OF PRESENTING ILLNESS:  Travis [name] 63 y.o. male is here because of incidentally noted RLL lung mass.  Patient presented to ER in early 03/2022 symptoms of lightheadedness and elevated blood pressure.  He also reported mild dyspnea and cough, associated with several months of unintentional weight loss and decreased appetite.  Over the constellation of his symptoms, CT CAP was done, which showed a 4 cm RLL mass extending into the posterior inferior RUL and extending toward the right hilum, concerning for primary lung cancer.  There were numerous small noncalcified nodules throughout the right lung, consistent with lung metastases.  There was no evidence of distant metastatic disease.  Patient reports that he has persistent fatigue, and intermittent lightheadedness, usually triggered by body position change, such as going from sitting to standing.  He reports relatively good appetite, but has lost approximately 20 pounds over the past several month since he had dental extraction.  He had been smoking 1 pack/day for over 40 years, but quit smoking in early January 2020 after he was made aware of the CT chest results.  He denies any fever, chill, night sweats, lymphadenopathy, chest pain, dyspnea, hemoptysis, abdominal pain, nausea, vomiting, diarrhea, or abnormal bleeding/bruising.  He works full-time as a Administrator.  I have reviewed his chart and materials related to his cancer extensively and collaborated history with the patient. Summary of oncologic history is as follows:   Lung cancer ([place])   03/31/2018 Imaging    CT CAP with contrast: IMPRESSION: 1. 4.2 x 3.3 x 3.1 cm irregularly marginated mass within the superior  segment of the right lower lobe appearing to extend across the fissure into the posterior inferior right upper lobe and extending toward the right hilum where the right upper lobe superior segment bronchus is occluded, consistent with primary lung carcinoma. 2. Small noncalcified nodules more numerous throughout the right lung most consistent with lung metastases. 3. No metastatic involvement of the abdomen or pelvis by CT. 4. Moderate thoracic and abdominal aortic atherosclerosis.    04/03/2018 Initial Diagnosis    Lung cancer [facility])     MEDICAL HISTORY:  Past Medical History:  Diagnosis Date  . Arthritis   . Diabetes mellitus   . Hypertension     SURGICAL HISTORY: History reviewed. No pertinent surgical history.  SOCIAL HISTORY: Social History   Socioeconomic History  . Marital status: Single    Spouse name: Not on file  . Number of children: Not on file  . Years of education: Not on file  . Highest education level: Not on file  Occupational History  . Not on file  Social Needs  . Financial resource strain: Not on file  . Food insecurity:    Worry: Not on file    Inability: Not on file  . Transportation needs:    Medical: Not on file    Non-medical: Not on file  Tobacco Use  . Smoking status: Current Every Day Smoker  Packs/day: 1.00    Years: 45.00    Pack years: 45.00    Types: Cigarettes  . Smokeless tobacco: Former Systems developer    Types: [place] date: 03/25/1973  Substance and Sexual Activity  . Alcohol use: Yes    Alcohol/week: 2.0 standard drinks    Types: 2 Shots of liquor per week  . Drug use: No  . Sexual activity: Not on file  Lifestyle  . Physical activity:    Days per week: Not on file    Minutes per session: Not on file  . Stress: Not on file  Relationships  . Social connections:    Talks on phone: Not on file    Gets together: Not on file    Attends religious service: Not on file    Active member of club or organization: Not on file     Attends meetings of clubs or organizations: Not on file    Relationship status: Not on file  . Intimate partner violence:    Fear of current or ex partner: Not on file    Emotionally abused: Not on file    Physically abused: Not on file    Forced sexual activity: Not on file  Other Topics Concern  . Not on file  Social History Narrative  . Not on file    FAMILY HISTORY: History reviewed. No pertinent family history.  ALLERGIES:  has No Known Allergies.  MEDICATIONS:  Current Outpatient Medications  Medication Sig Dispense Refill  . lisinopril (PRINIVIL,ZESTRIL) 20 MG tablet Take 0.5 tablets (10 mg total) by mouth daily. 30 tablet 0  . metFORMIN (GLUCOPHAGE) 500 MG tablet Take 1 tablet (500 mg total) by mouth 2 (two) times daily with a meal. 60 tablet 1  . oxyCODONE-acetaminophen (PERCOCET/ROXICET) 5-325 MG tablet Take 1 tablet by mouth every 4 (four) hours as needed for severe pain. (Patient not taking: Reported on 04/09/2018) 15 tablet 0   No current facility-administered medications for this visit.     REVIEW OF SYSTEMS:   Constitutional: ( - ) fevers, ( - )  chills , ( - ) night sweats Eyes: ( - ) blurriness of vision, ( - ) double vision, ( - ) watery eyes Ears, nose, mouth, throat, and face: ( - ) mucositis, ( - ) sore throat Respiratory: ( - ) cough, ( - ) dyspnea, ( - ) wheezes Cardiovascular: ( - ) palpitation, ( - ) chest discomfort, ( - ) lower extremity swelling Gastrointestinal:  ( - ) nausea, ( - ) heartburn, ( - ) change in bowel habits Skin: ( - ) abnormal skin rashes Lymphatics: ( - ) new lymphadenopathy, ( - ) easy bruising Neurological: ( - ) numbness, ( - ) tingling, ( - ) new weaknesses Behavioral/Psych: ( - ) mood change, ( - ) new changes  All other systems were reviewed with the patient and are negative.  PHYSICAL EXAMINATION: ECOG PERFORMANCE STATUS: 1 - Symptomatic but completely ambulatory  Vitals:   04/09/18 1216  BP: (!) 142/74  Pulse: (!) 112   Resp: 18  Temp: 97.9 F (36.6 C)  SpO2: 100%   Filed Weights   04/09/18 1216  Weight: 160 lb 12.8 oz (72.9 kg)    GENERAL: alert, no distress and comfortable, thin SKIN: skin color, texture, turgor are normal, no rashes or significant lesions EYES: conjunctiva are pink and non-injected, sclera clear OROPHARYNX: no exudate, no erythema; lips, buccal mucosa, and tongue normal  NECK: supple, non-tender LYMPH:  no palpable lymphadenopathy in the cervical or axillary LUNGS: clear to auscultation and percussion with normal breathing effort HEART: regular rate & rhythm, no murmurs, no lower extremity edema ABDOMEN: soft, non-tender, non-distended, normal bowel sounds Musculoskeletal: no cyanosis of digits and no clubbing  PSYCH: alert & oriented x 3, fluent speech NEURO: no focal motor/sensory deficits  LABORATORY DATA:  I have reviewed the data as listed Lab Results  Component Value Date   WBC 6.7 04/09/2018   HGB 13.1 04/09/2018   HCT 40.9 04/09/2018   MCV 98.6 04/09/2018   PLT 267 04/09/2018   Lab Results  Component Value Date   NA 145 04/09/2018   K 5.2 (H) 04/09/2018   CL 107 04/09/2018   CO2 29 04/09/2018    RADIOGRAPHIC STUDIES: I have personally reviewed the radiological images as listed and agreed with the findings in the report. Dg Chest 2 View  Result Date: 03/31/2018 CLINICAL DATA:  Cough and short of breath EXAM: CHEST - 2 VIEW COMPARISON:  04/20/2016 FINDINGS: Patchy 3 cm right perihilar airspace density is new. Left lung is clear. No effusion. Heart size and vascularity normal. IMPRESSION: New right perihilar density. Probable pneumonia however mass lesion is possible. Followup PA and lateral chest X-ray is recommended in 3-4 weeks following trial of antibiotic therapy to ensure resolution and exclude underlying malignancy. Electronically Signed   By: Franchot Gallo M.D.   On: 03/31/2018 13:01   Ct Chest W Contrast  Result Date: 03/31/2018 CLINICAL DATA:   Weight loss unintended, abdominal pain, anorexia, some dizziness and high blood pressure today, right shoulder pain EXAM: CT CHEST, ABDOMEN, AND PELVIS WITH CONTRAST TECHNIQUE: Multidetector CT imaging of the chest, abdomen and pelvis was performed following the standard protocol during bolus administration of intravenous contrast. CONTRAST:  16mL ISOVUE-300 IOPAMIDOL (ISOVUE-300) INJECTION 61% COMPARISON:  Chest x-ray of 03/31/2018 FINDINGS: CT CHEST FINDINGS Cardiovascular: Moderate thoracic aortic atherosclerosis is present. The heart is mildly enlarged. No significant pericardial effusion is seen. There is good opacification of the pulmonary arteries and no evidence of embolism is noted. The thoracic aorta also is well opacified with no acute abnormality noted. The mid ascending thoracic aorta measures 33 mm in diameter. Mediastinum/Nodes: There may be right hilar node on image 36 series 2 measuring 10 mm in diameter. No other definite mediastinal or hilar adenopathy is seen with a small precarinal node of 7 mm. Only small mediastinal lymph nodes are present. The thyroid gland is unremarkable. No abnormality of the esophagus is seen by CT. Lungs/Pleura: On lung window images, there is and irregularly marginated solid-appearing mass within the right upper lobe extending toward the hilum. This mass appears to cross the major fissure into the right upper lobe posterior medially. The bronchus to the superior segment of the right lower lobe is occluded presumably by tumor. This lesion measures 4.2 x 3.3 cm with height of approximate 3.1 cm. In addition, there are multiple scattered lung nodules primarily throughout the right lung. The largest nodule is in the superior segment of the right lower lobe as well on image 97 series 6 measuring 9 mm in diameter. Additional nodules are scattered primarily throughout the right lung a between 3 and 7 mm. Two nodules are present within the left lung which appear pleural based  of 3 mm in diameter on image 134 and 3 mm in diameter on image 143 both in the left lower lobe. A nodular opacity adjacent to the major fissure on the left on image 119  probably represents perifissural lymph node. Musculoskeletal: There are degenerative changes in the lower cervical, upper thoracic, and lower thoracic spine. No lytic or blastic bone lesion is seen. The sternum is unremarkable other than degenerative change. CT ABDOMEN PELVIS FINDINGS Hepatobiliary: The liver enhances with no focal abnormality noted. No ductal dilatation is seen. Gallbladder is somewhat contracted and no calcified gallstones are seen. Pancreas: The pancreas is normal in size in the pancreatic duct is not dilated. Spleen: The spleen is somewhat inhomogeneous in enhancement, but no suspicious focal abnormality is noted. Adrenals/Urinary Tract: The adrenal glands appear normal. The kidneys enhance with no calculus or mass. No hydronephrosis is seen on delayed images. The pelvocaliceal systems are unremarkable. The ureters appear normal in caliber. The urinary bladder is moderately urine distended with no abnormality noted. Stomach/Bowel: The stomach is distended with fluid and food debris. No abnormality is seen. No distention of small bowel is seen and no edema is noted. No abnormality of the colon is seen. The terminal ileum and the appendix are unremarkable. Vascular/Lymphatic: Moderate abdominal aortic atherosclerosis is noted. No adenopathy is seen. Reproductive: The prostate is normal in size. Other: No abdominal wall hernia is noted, other than a tiny umbilical hernia containing only fat. Musculoskeletal: The lumbar vertebrae are in normal alignment with mild degenerative change. There is degenerative change involve the facet joints of L4-5 and L5-S1. No lytic or blastic bony lesion is seen. The SI joints appear corticated IMPRESSION: 1. 4.2 x 3.3 x 3.1 cm irregularly marginated mass within the superior segment of the right lower  lobe appearing to extend across the fissure into the posterior inferior right upper lobe and extending toward the right hilum where the right upper lobe superior segment bronchus is occluded, consistent with primary lung carcinoma. 2. Small noncalcified nodules more numerous throughout the right lung most consistent with lung metastases. 3. No metastatic involvement of the abdomen or pelvis by CT. 4. Moderate thoracic and abdominal aortic atherosclerosis. Electronically Signed   By: Ivar Drape M.D.   On: 03/31/2018 14:16   Ct Abdomen Pelvis W Contrast  Result Date: 03/31/2018 CLINICAL DATA:  Weight loss unintended, abdominal pain, anorexia, some dizziness and high blood pressure today, right shoulder pain EXAM: CT CHEST, ABDOMEN, AND PELVIS WITH CONTRAST TECHNIQUE: Multidetector CT imaging of the chest, abdomen and pelvis was performed following the standard protocol during bolus administration of intravenous contrast. CONTRAST:  165mL ISOVUE-300 IOPAMIDOL (ISOVUE-300) INJECTION 61% COMPARISON:  Chest x-ray of 03/31/2018 FINDINGS: CT CHEST FINDINGS Cardiovascular: Moderate thoracic aortic atherosclerosis is present. The heart is mildly enlarged. No significant pericardial effusion is seen. There is good opacification of the pulmonary arteries and no evidence of embolism is noted. The thoracic aorta also is well opacified with no acute abnormality noted. The mid ascending thoracic aorta measures 33 mm in diameter. Mediastinum/Nodes: There may be right hilar node on image 36 series 2 measuring 10 mm in diameter. No other definite mediastinal or hilar adenopathy is seen with a small precarinal node of 7 mm. Only small mediastinal lymph nodes are present. The thyroid gland is unremarkable. No abnormality of the esophagus is seen by CT. Lungs/Pleura: On lung window images, there is and irregularly marginated solid-appearing mass within the right upper lobe extending toward the hilum. This mass appears to cross the  major fissure into the right upper lobe posterior medially. The bronchus to the superior segment of the right lower lobe is occluded presumably by tumor. This lesion measures 4.2 x 3.3 cm with  height of approximate 3.1 cm. In addition, there are multiple scattered lung nodules primarily throughout the right lung. The largest nodule is in the superior segment of the right lower lobe as well on image 97 series 6 measuring 9 mm in diameter. Additional nodules are scattered primarily throughout the right lung a between 3 and 7 mm. Two nodules are present within the left lung which appear pleural based of 3 mm in diameter on image 134 and 3 mm in diameter on image 143 both in the left lower lobe. A nodular opacity adjacent to the major fissure on the left on image 119 probably represents perifissural lymph node. Musculoskeletal: There are degenerative changes in the lower cervical, upper thoracic, and lower thoracic spine. No lytic or blastic bone lesion is seen. The sternum is unremarkable other than degenerative change. CT ABDOMEN PELVIS FINDINGS Hepatobiliary: The liver enhances with no focal abnormality noted. No ductal dilatation is seen. Gallbladder is somewhat contracted and no calcified gallstones are seen. Pancreas: The pancreas is normal in size in the pancreatic duct is not dilated. Spleen: The spleen is somewhat inhomogeneous in enhancement, but no suspicious focal abnormality is noted. Adrenals/Urinary Tract: The adrenal glands appear normal. The kidneys enhance with no calculus or mass. No hydronephrosis is seen on delayed images. The pelvocaliceal systems are unremarkable. The ureters appear normal in caliber. The urinary bladder is moderately urine distended with no abnormality noted. Stomach/Bowel: The stomach is distended with fluid and food debris. No abnormality is seen. No distention of small bowel is seen and no edema is noted. No abnormality of the colon is seen. The terminal ileum and the appendix  are unremarkable. Vascular/Lymphatic: Moderate abdominal aortic atherosclerosis is noted. No adenopathy is seen. Reproductive: The prostate is normal in size. Other: No abdominal wall hernia is noted, other than a tiny umbilical hernia containing only fat. Musculoskeletal: The lumbar vertebrae are in normal alignment with mild degenerative change. There is degenerative change involve the facet joints of L4-5 and L5-S1. No lytic or blastic bony lesion is seen. The SI joints appear corticated IMPRESSION: 1. 4.2 x 3.3 x 3.1 cm irregularly marginated mass within the superior segment of the right lower lobe appearing to extend across the fissure into the posterior inferior right upper lobe and extending toward the right hilum where the right upper lobe superior segment bronchus is occluded, consistent with primary lung carcinoma. 2. Small noncalcified nodules more numerous throughout the right lung most consistent with lung metastases. 3. No metastatic involvement of the abdomen or pelvis by CT. 4. Moderate thoracic and abdominal aortic atherosclerosis. Electronically Signed   By: Ivar Drape M.D.   On: 03/31/2018 14:16

## 2018-04-06 NOTE — Telephone Encounter (Signed)
Dr. Lake Bells, please advise if there is any way we could get pt worked into your [place] schedules for a consult with this being an urgent referral from Dr. Maylon Peppers.

## 2018-04-07 ENCOUNTER — Telehealth: Payer: Self-pay

## 2018-04-07 NOTE — Telephone Encounter (Signed)
If someone cancels today or Thursday then I can see him.  However I strongly recommend he be seen here tomorrow as we have an opening and he needs a bronchoscopy.  He could always follow up with me in [place] for all visits afterwards but he should be seen ASAP, even if with another provider here for the first visit.

## 2018-04-07 NOTE — Telephone Encounter (Signed)
Called and spoke with Patient.  He stated that he is able to make the [place] 1:30 appointment with Dr. Lake Bells. Appointment scheduled per BQ request. Nothing further at this time.

## 2018-04-07 NOTE — Telephone Encounter (Signed)
Per BQ: Pt can be worked into his Thursday (1/16) 1:30 slot.  If he cannot make this appointment, he needs to see another provider next available in [place] office to establish.

## 2018-04-07 NOTE — Telephone Encounter (Signed)
Pt called to get exact location of [place] office and confirm his apt date and time with Dr. Lake Bells. Pt was given address, date and time of apt. Nothing more needed at this time.

## 2018-04-09 ENCOUNTER — Ambulatory Visit: Payer: 59 | Admitting: Pulmonary Disease

## 2018-04-09 ENCOUNTER — Encounter: Payer: Self-pay | Admitting: Hematology

## 2018-04-09 ENCOUNTER — Encounter: Payer: Self-pay | Admitting: Pulmonary Disease

## 2018-04-09 ENCOUNTER — Encounter: Payer: Self-pay | Admitting: *Deleted

## 2018-04-09 ENCOUNTER — Inpatient Hospital Stay (HOSPITAL_BASED_OUTPATIENT_CLINIC_OR_DEPARTMENT_OTHER): Payer: BLUE CROSS/BLUE SHIELD | Admitting: Hematology

## 2018-04-09 ENCOUNTER — Ambulatory Visit (INDEPENDENT_AMBULATORY_CARE_PROVIDER_SITE_OTHER): Payer: BLUE CROSS/BLUE SHIELD | Admitting: Pulmonary Disease

## 2018-04-09 ENCOUNTER — Inpatient Hospital Stay: Payer: BLUE CROSS/BLUE SHIELD | Attending: Hematology

## 2018-04-09 VITALS — BP 144/84 | HR 110 | Ht 71.0 in | Wt 160.0 lb

## 2018-04-09 VITALS — BP 142/74 | HR 112 | Temp 97.9°F | Resp 18 | Ht 71.0 in | Wt 160.8 lb

## 2018-04-09 DIAGNOSIS — E119 Type 2 diabetes mellitus without complications: Secondary | ICD-10-CM

## 2018-04-09 DIAGNOSIS — F17210 Nicotine dependence, cigarettes, uncomplicated: Secondary | ICD-10-CM | POA: Insufficient documentation

## 2018-04-09 DIAGNOSIS — Z720 Tobacco use: Secondary | ICD-10-CM

## 2018-04-09 DIAGNOSIS — D649 Anemia, unspecified: Secondary | ICD-10-CM | POA: Insufficient documentation

## 2018-04-09 DIAGNOSIS — R918 Other nonspecific abnormal finding of lung field: Secondary | ICD-10-CM | POA: Diagnosis not present

## 2018-04-09 DIAGNOSIS — R42 Dizziness and giddiness: Secondary | ICD-10-CM | POA: Insufficient documentation

## 2018-04-09 DIAGNOSIS — N179 Acute kidney failure, unspecified: Secondary | ICD-10-CM | POA: Insufficient documentation

## 2018-04-09 DIAGNOSIS — E875 Hyperkalemia: Secondary | ICD-10-CM | POA: Insufficient documentation

## 2018-04-09 DIAGNOSIS — J432 Centrilobular emphysema: Secondary | ICD-10-CM | POA: Diagnosis not present

## 2018-04-09 DIAGNOSIS — C3431 Malignant neoplasm of lower lobe, right bronchus or lung: Secondary | ICD-10-CM

## 2018-04-09 DIAGNOSIS — I700 Atherosclerosis of aorta: Secondary | ICD-10-CM | POA: Insufficient documentation

## 2018-04-09 DIAGNOSIS — C3491 Malignant neoplasm of unspecified part of right bronchus or lung: Secondary | ICD-10-CM

## 2018-04-09 DIAGNOSIS — R55 Syncope and collapse: Secondary | ICD-10-CM

## 2018-04-09 DIAGNOSIS — Z7984 Long term (current) use of oral hypoglycemic drugs: Secondary | ICD-10-CM

## 2018-04-09 DIAGNOSIS — I10 Essential (primary) hypertension: Secondary | ICD-10-CM | POA: Insufficient documentation

## 2018-04-09 DIAGNOSIS — R911 Solitary pulmonary nodule: Secondary | ICD-10-CM

## 2018-04-09 DIAGNOSIS — F17290 Nicotine dependence, other tobacco product, uncomplicated: Secondary | ICD-10-CM | POA: Diagnosis not present

## 2018-04-09 DIAGNOSIS — K429 Umbilical hernia without obstruction or gangrene: Secondary | ICD-10-CM | POA: Insufficient documentation

## 2018-04-09 DIAGNOSIS — Z79899 Other long term (current) drug therapy: Secondary | ICD-10-CM

## 2018-04-09 LAB — CBC WITH DIFFERENTIAL (CANCER CENTER ONLY)
ABS IMMATURE GRANULOCYTES: 0.03 10*3/uL (ref 0.00–0.07)
Basophils Absolute: 0 10*3/uL (ref 0.0–0.1)
Basophils Relative: 0 %
EOS PCT: 4 %
Eosinophils Absolute: 0.3 10*3/uL (ref 0.0–0.5)
HCT: 40.9 % (ref 39.0–52.0)
Hemoglobin: 13.1 g/dL (ref 13.0–17.0)
Immature Granulocytes: 0 %
Lymphocytes Relative: 33 %
Lymphs Abs: 2.2 10*3/uL (ref 0.7–4.0)
MCH: 31.6 pg (ref 26.0–34.0)
MCHC: 32 g/dL (ref 30.0–36.0)
MCV: 98.6 fL (ref 80.0–100.0)
MONO ABS: 0.5 10*3/uL (ref 0.1–1.0)
MONOS PCT: 7 %
Neutro Abs: 3.7 10*3/uL (ref 1.7–7.7)
Neutrophils Relative %: 56 %
Platelet Count: 267 10*3/uL (ref 150–400)
RBC: 4.15 MIL/uL — AB (ref 4.22–5.81)
RDW: 11.9 % (ref 11.5–15.5)
WBC: 6.7 10*3/uL (ref 4.0–10.5)
nRBC: 0 % (ref 0.0–0.2)

## 2018-04-09 LAB — CMP (CANCER CENTER ONLY)
ALT: 11 U/L (ref 0–44)
ANION GAP: 9 (ref 5–15)
AST: 9 U/L — ABNORMAL LOW (ref 15–41)
Albumin: 4.4 g/dL (ref 3.5–5.0)
Alkaline Phosphatase: 63 U/L (ref 38–126)
BUN: 24 mg/dL — ABNORMAL HIGH (ref 6–20)
CALCIUM: 10.2 mg/dL (ref 8.9–10.3)
CO2: 29 mmol/L (ref 22–32)
CREATININE: 1.42 mg/dL — AB (ref 0.61–1.24)
Chloride: 107 mmol/L (ref 98–111)
GFR, EST NON AFRICAN AMERICAN: 53 mL/min — AB (ref >60)
Glucose, Bld: 185 mg/dL — ABNORMAL HIGH (ref 70–99)
Potassium: 5.2 mmol/L — ABNORMAL HIGH (ref 3.5–5.1)
SODIUM: 145 mmol/L (ref 135–145)
Total Bilirubin: 0.7 mg/dL (ref 0.3–1.2)
Total Protein: 6.9 g/dL (ref 6.5–8.1)

## 2018-04-09 NOTE — Patient Instructions (Signed)
Right lower lobe mass: Plan bronchoscopy at [facility] on Tuesday, January 21 at 11 AM Do not eat anything or drink anything the morning of the procedure  Tobacco abuse: Quit smoking  Centrilobular emphysema: We will need spirometry at some point  We will see you back in 2-3 weeks

## 2018-04-09 NOTE — Progress Notes (Signed)
..  Initial RN Navigator Patient Visit  Name: Travis [name] Date of Referral : 04/02/2018 Diagnosis: Lung  Met with patient prior to their visit with MD. Hanley Seamen patient "Your Patient Navigator" handout which explains my role, areas in which I am able to help, and all the contact information for myself and the office. Also gave patient MD and Navigator business card. Reviewed with patient the general overview of expected course after initial diagnosis and time frame for all steps to be completed.  Patient completed visit with Dr. Maylon Peppers  Revisited with patient after MD visit. Patient will need  - PET Scan already scheduled - Pulmonary Appointment already schedule for this afternoon - MRI will reach out to schedule  Patient understands all follow up procedures and expectations. They have my number to reach out for any further clarification or additional needs. Will call patient in 5-7 days to see if any further needs have presented, or if patient has any further questions or needs.

## 2018-04-09 NOTE — Progress Notes (Signed)
Synopsis: Referred in January 2020 for Lung mass  Subjective:   PATIENT ID: Travis [name] GENDER: male DOB: Jun 21, 1955, MRN: [id number]   HPI  Chief Complaint  Patient presents with  . Consult    referred by Dr. Maylon Peppers for lung mass.    [name] presented to our clinic today for evaluation of a right lower lobe mass.  He says that he has smoked at least 1 pack of cigarettes daily ever since age 63 and still smokes here recently.  He says that recently before he went to work he was feeling poorly so he checked his blood pressure which was elevated and his blood sugar was normal.  He went to work but he said during that day he felt significantly fatigued and felt that something was wrong so he went to the [facility] for further evaluation.  There he had a chest x-ray performed which showed a mass in his right lung, a follow-up CT scan was performed which showed a right lower lobe mass with multiple scattered nodules and no lymphadenopathy.  He is here today to see me for evaluation of a bronchoscopy.  He says that he does not have a problem with cough or mucus production.  He says sometimes he will cough if he is smoking cigarettes.  He denies any sort of chest pain and he says he has not been coughing up blood.  He says that he has lost a little bit of weight this year.  Past Medical History:  Diagnosis Date  . Arthritis   . Diabetes mellitus   . Hypertension      Family History  Problem Relation Age of Onset  . Leukemia Sister   . Cancer Brother      Social History   Socioeconomic History  . Marital status: Single    Spouse name: Not on file  . Number of children: Not on file  . Years of education: Not on file  . Highest education level: Not on file  Occupational History  . Not on file  Social Needs  . Financial resource strain: Not on file  . Food insecurity:    Worry: Not on file    Inability: Not on file  . Transportation needs:    Medical: Not on  file    Non-medical: Not on file  Tobacco Use  . Smoking status: Current Every Day Smoker    Packs/day: 1.00    Years: 45.00    Pack years: 45.00    Types: Cigarettes  . Smokeless tobacco: Former Systems developer    Types: [place] date: 03/25/1973  Substance and Sexual Activity  . Alcohol use: Yes    Alcohol/week: 2.0 standard drinks    Types: 2 Shots of liquor per week  . Drug use: No  . Sexual activity: Not on file  Lifestyle  . Physical activity:    Days per week: Not on file    Minutes per session: Not on file  . Stress: Not on file  Relationships  . Social connections:    Talks on phone: Not on file    Gets together: Not on file    Attends religious service: Not on file    Active member of club or organization: Not on file    Attends meetings of clubs or organizations: Not on file    Relationship status: Not on file  . Intimate partner violence:    Fear of current or ex partner: Not on  file    Emotionally abused: Not on file    Physically abused: Not on file    Forced sexual activity: Not on file  Other Topics Concern  . Not on file  Social History Narrative  . Not on file     No Known Allergies   Outpatient Medications Prior to Visit  Medication Sig Dispense Refill  . metFORMIN (GLUCOPHAGE) 500 MG tablet Take 1 tablet (500 mg total) by mouth 2 (two) times daily with a meal. 60 tablet 1  . lisinopril (PRINIVIL,ZESTRIL) 20 MG tablet Take 0.5 tablets (10 mg total) by mouth daily. (Patient taking differently: Take 20 mg by mouth daily. ) 30 tablet 0  . oxyCODONE-acetaminophen (PERCOCET/ROXICET) 5-325 MG tablet Take 1 tablet by mouth every 4 (four) hours as needed for severe pain. (Patient not taking: Reported on 04/09/2018) 15 tablet 0   No facility-administered medications prior to visit.     ROS    Objective:  Physical Exam   Vitals:   04/09/18 1322  BP: (!) 144/84  Pulse: (!) 110  SpO2: 100%  Weight: 160 lb (72.6 kg)  Height: 5\' 11"  (1.803 m)   Gen:  chronically ill appearing, no acute distress HENT: NCAT, OP clear, neck supple without masses Eyes: PERRL, EOMi Lymph: no cervical lymphadenopathy PULM: CTA B CV: RRR, no mgr, no JVD GI: BS+, soft, nontender, no hsm Derm: no rash or skin breakdown MSK: normal bulk and tone Neuro: A&Ox4, CN II-XII intact, strength 5/5 in all 4 extremities Psyche: normal mood and affect   CBC    Component Value Date/Time   WBC 6.7 04/09/2018 1201   WBC 6.7 03/31/2018 1142   RBC 4.15 (L) 04/09/2018 1201   HGB 13.1 04/09/2018 1201   HCT 40.9 04/09/2018 1201   PLT 267 04/09/2018 1201   MCV 98.6 04/09/2018 1201   MCH 31.6 04/09/2018 1201   MCHC 32.0 04/09/2018 1201   RDW 11.9 04/09/2018 1201   LYMPHSABS 2.2 04/09/2018 1201   MONOABS 0.5 04/09/2018 1201   EOSABS 0.3 04/09/2018 1201   BASOSABS 0.0 04/09/2018 1201     Chest imaging: January 2020 CT chest images independently reviewed showing some paraseptal emphysema, large right lower lobe mass occluding the superior segments of the right lower lobe by my review no pathologically increased mediastinal lymph nodes  PFT:  Labs:  Path:  Echo:  Heart Catheterization:       Assessment & Plan:   Cigar smoker  Right lower lobe lung mass  Centrilobular emphysema (HCC)  Discussion: 63 year old male smoker presents with a right lower lobe superior segment mass highly worrisome for bronchogenic carcinoma.  We discussed various options today in clinic and discussed that he needs to have a biopsy in order to know definitively what is going on and then plan a treatment strategy.  He also needs to have a PET scan which has been ordered by the oncology clinic.  He voiced understanding and he says he is willing to proceed with a bronchoscopy.  This lesion is located in the superior segment of the right upper lobe which tends to be a bit more difficult for bronchoscopic access but I think we should be able to visualize the abnormality based on my  review of the CT scan.  Plan: Right lower lobe mass: Plan bronchoscopy at [facility] on Tuesday, January 21 at 11 AM Do not eat anything or drink anything the morning of the procedure  Tobacco abuse: Quit smoking  Centrilobular emphysema: We  will need spirometry at some point  We will see you back in 2-3 weeks    Current Outpatient Medications:  .  metFORMIN (GLUCOPHAGE) 500 MG tablet, Take 1 tablet (500 mg total) by mouth 2 (two) times daily with a meal., Disp: 60 tablet, Rfl: 1

## 2018-04-10 LAB — IRON AND TIBC
IRON: 104 ug/dL (ref 42–163)
Saturation Ratios: 38 % (ref 20–55)
TIBC: 273 ug/dL (ref 202–409)
UIBC: 168 ug/dL (ref 117–376)

## 2018-04-10 LAB — FERRITIN: FERRITIN: 342 ng/mL — AB (ref 24–336)

## 2018-04-11 ENCOUNTER — Telehealth: Payer: Self-pay | Admitting: Pulmonary Disease

## 2018-04-11 NOTE — Telephone Encounter (Signed)
Spoke with patient regarding bronchoscopy, need to reschedule for 7/23 AM at [facility] or [facility].  He voiced understanding.

## 2018-04-13 ENCOUNTER — Telehealth: Payer: Self-pay

## 2018-04-13 DIAGNOSIS — R942 Abnormal results of pulmonary function studies: Secondary | ICD-10-CM

## 2018-04-13 NOTE — Telephone Encounter (Signed)
-----   Message from Juanito Doom, MD sent at 04/11/2018  6:02 PM EST ----- Hi,  I need to reschedule his bronchoscopy to Thursday morning first case either at [facility] or [facility].  I called him and he is OK with that.  Please re-arrange with the bronchoscopy suite.  Thanks, Ruby Cola

## 2018-04-13 NOTE — Telephone Encounter (Signed)
Routing message to Caryl Pina so she may discuss this with BQ.

## 2018-04-13 NOTE — Telephone Encounter (Signed)
lmtcb for Baxter Flattery at Respiratory to reschedule bronch to Thursday.  Will need to relay new location and time to both patient and BQ.

## 2018-04-13 NOTE — Telephone Encounter (Signed)
BQ is off today and tomorrow.  Sent a text to BQ to see how he would like to proceed.  Will document once he has responded with recs.

## 2018-04-13 NOTE — Telephone Encounter (Signed)
Baxter Flattery returning call. Per Baxter Flattery, Thursday will not for the bronch. If it needs to be done this week, they are avail on Friday at 1pm. Cb is [phone].

## 2018-04-14 ENCOUNTER — Ambulatory Visit (HOSPITAL_COMMUNITY)
Admission: RE | Admit: 2018-04-14 | Discharge: 2018-04-14 | Disposition: A | Discharge status: Home / Self Care | Payer: BLUE CROSS/BLUE SHIELD | Source: Ambulatory Visit | Attending: Hematology | Admitting: Hematology

## 2018-04-14 ENCOUNTER — Encounter (HOSPITAL_COMMUNITY): Payer: BLUE CROSS/BLUE SHIELD

## 2018-04-14 ENCOUNTER — Telehealth: Payer: Self-pay | Admitting: Pulmonary Disease

## 2018-04-14 ENCOUNTER — Ambulatory Visit (HOSPITAL_COMMUNITY): Admit: 2018-04-14 | Payer: BLUE CROSS/BLUE SHIELD | Admitting: Pulmonary Disease

## 2018-04-14 ENCOUNTER — Encounter (HOSPITAL_COMMUNITY): Payer: Self-pay

## 2018-04-14 DIAGNOSIS — R911 Solitary pulmonary nodule: Secondary | ICD-10-CM | POA: Insufficient documentation

## 2018-04-14 LAB — GLUCOSE, CAPILLARY: Glucose-Capillary: 133 mg/dL — ABNORMAL HIGH (ref 70–99)

## 2018-04-14 SURGERY — BRONCHOSCOPY, WITH FLUOROSCOPY
Anesthesia: Moderate Sedation | Laterality: Bilateral

## 2018-04-14 MED ORDER — FLUDEOXYGLUCOSE F - 18 (FDG) INJECTION
8.0000 | Freq: Once | INTRAVENOUS | Status: AC | PRN
  Administered 2018-04-14: 8 via INTRAVENOUS

## 2018-04-14 NOTE — Telephone Encounter (Signed)
Pt called back, aware of recs.   Will send message back to BQ to document recs after PET has been read.

## 2018-04-14 NOTE — Telephone Encounter (Signed)
Spoke with the pt  He states that someone from our office called this am but did not leave msg  He wondered if it was Dr Lake Bells calling regarding his PET scan  Please advise, thanks

## 2018-04-14 NOTE — Telephone Encounter (Signed)
Per BQ: wait to review PET scan results (being obtained today) and then decide what the best test is for him.  BQ states he will review this test when he returns to clinic tomorrow.  lmtcb for pt to make aware of BQ's recs.

## 2018-04-15 ENCOUNTER — Telehealth: Payer: Self-pay | Admitting: Pulmonary Disease

## 2018-04-15 NOTE — Telephone Encounter (Signed)
Need to arrange EBUS at [facility], Tue or Wed next week

## 2018-04-15 NOTE — Telephone Encounter (Signed)
Per BQ-  Need to arrange EBUS at [facility], Tue or Wed next week  Order placed. Called and spoke with Patient.  Patient aware of EBUS is planned for next week, and someone will reach out with day and time. Nothing further at this time.

## 2018-04-15 NOTE — Telephone Encounter (Signed)
We will call him today, result note sent to [facility] about thsi

## 2018-04-15 NOTE — Telephone Encounter (Signed)
EBUS@[facility]  04/20/18@1 :15pm pt is aware Travis [name]

## 2018-04-15 NOTE — Telephone Encounter (Signed)
I am working on getting it scheduled Travis [name]

## 2018-04-15 NOTE — Telephone Encounter (Signed)
Per BQ-  Need to arrange EBUS at [facility], Tue or Wed next week  Order placed. Called and spoke with Patient.  Patient aware of EBUS is planned for next week, and someone will reach out with day and time. Nothing further at this time.

## 2018-04-17 ENCOUNTER — Encounter (HOSPITAL_COMMUNITY): Payer: Self-pay | Admitting: *Deleted

## 2018-04-17 ENCOUNTER — Other Ambulatory Visit: Payer: Self-pay | Admitting: Pulmonary Disease

## 2018-04-17 ENCOUNTER — Other Ambulatory Visit: Payer: Self-pay

## 2018-04-17 ENCOUNTER — Telehealth: Payer: Self-pay | Admitting: Hematology

## 2018-04-17 ENCOUNTER — Encounter: Payer: Self-pay | Admitting: *Deleted

## 2018-04-17 NOTE — Progress Notes (Signed)
Mr Travis [name] denies chest pain. Patient states that he has some shortness of breath at times.  PCP is Dr Hassell Done, patient denies seeing a cardiologist. Mr Travis [name] has type II diabetes, he reports that fasting CBGs average 130. I instructed patient to check CBG after awaking and every 2 hours until arrival  to the hospital.  I Instructed patient if CBG is less than 70 to drink 1/2 cup of a clear juice. Recheck CBG in 15 minutes then call pre- op desk at [phone] for further instructions.

## 2018-04-17 NOTE — Telephone Encounter (Signed)
Called and LMVM for patient regarding appointment times being changed per 1/24 staff message

## 2018-04-17 NOTE — Progress Notes (Signed)
Spoke with patient to follow up after last weeks appointment. His biopsy, MRI and follow up appointment with Dr Maylon Peppers have all been scheduled. He has no questions or barriers at this time, but he is asking that his appointment with Dr Maylon Peppers on 04/24/2018 be moved to a later time. I explained to him that I would reach out to Dr Maylon Peppers and scheduling and they would get back with him.  Message sent to Dr Maylon Peppers and Gillermina Hu. They will push the appointment to one hour later to try and accommodate his schedule.

## 2018-04-17 NOTE — Anesthesia Preprocedure Evaluation (Addendum)
Anesthesia Evaluation  Patient identified by MRN, date of birth, ID band Patient awake    Reviewed: Allergy & Precautions, NPO status , Patient's Chart, lab work & pertinent test results  History of Anesthesia Complications Negative for: history of anesthetic complications  Airway Mallampati: I  TM Distance: >3 FB Neck ROM: Full    Dental  (+) Edentulous Upper, Poor Dentition, Missing, Chipped, Dental Advisory Given   Pulmonary shortness of breath and with exertion, neg pneumonia , Current Smoker, former smoker,  Lung mass   breath sounds clear to auscultation       Cardiovascular hypertension, Pt. on medications (-) angina Rhythm:Regular Rate:Normal     Neuro/Psych negative neurological ROS     GI/Hepatic negative GI ROS, Neg liver ROS,   Endo/Other  diabetes (glu 156), Oral Hypoglycemic Agents  Renal/GU Renal InsufficiencyRenal disease (creat 1.42)     Musculoskeletal   Abdominal   Peds  Hematology   Anesthesia Other Findings   Reproductive/Obstetrics                           Anesthesia Physical Anesthesia Plan  ASA: III  Anesthesia Plan: General   Post-op Pain Management:    Induction: Intravenous  PONV Risk Score and Plan: 2 and Ondansetron and Dexamethasone  Airway Management Planned: Oral ETT  Additional Equipment:   Intra-op Plan:   Post-operative Plan: Extubation in OR  Informed Consent: I have reviewed the patients History and Physical, chart, labs and discussed the procedure including the risks, benefits and alternatives for the proposed anesthesia with the patient or authorized representative who has indicated his/her understanding and acceptance.       Plan Discussed with:   Anesthesia Plan Comments: (Pt saw oncologist Dr. Maylon Peppers 04/09/18. Noted mild AKI with Cr 1.42, up from 0.9 one week ago, and mild hyperkalemia with K+ 5.2. Thought due to dehydration.  Lisinopril was discontinued. Recommended recheck BMet in 2 weeks. Preop BMet ordered to follow trend. Plan routine monitors, GETA)       Anesthesia Quick Evaluation

## 2018-04-17 NOTE — Progress Notes (Unsigned)
Orders for EBUS placed

## 2018-04-18 ENCOUNTER — Ambulatory Visit (HOSPITAL_BASED_OUTPATIENT_CLINIC_OR_DEPARTMENT_OTHER)
Admission: RE | Admit: 2018-04-18 | Discharge: 2018-04-18 | Disposition: A | Discharge status: Home / Self Care | Payer: BLUE CROSS/BLUE SHIELD | Source: Ambulatory Visit | Attending: Hematology | Admitting: Hematology

## 2018-04-18 DIAGNOSIS — R911 Solitary pulmonary nodule: Secondary | ICD-10-CM

## 2018-04-18 MED ORDER — GADOBUTROL 1 MMOL/ML IV SOLN
7.2000 mL | Freq: Once | INTRAVENOUS | Status: AC | PRN
  Administered 2018-04-18: 7.2 mL via INTRAVENOUS

## 2018-04-20 ENCOUNTER — Ambulatory Visit (HOSPITAL_COMMUNITY): Payer: BLUE CROSS/BLUE SHIELD | Admitting: Physician Assistant

## 2018-04-20 ENCOUNTER — Encounter (HOSPITAL_COMMUNITY)
Admission: RE | Disposition: A | Discharge status: Home / Self Care | Payer: Self-pay | Source: Home / Self Care | Attending: Pulmonary Disease

## 2018-04-20 ENCOUNTER — Encounter (HOSPITAL_COMMUNITY): Payer: Self-pay

## 2018-04-20 ENCOUNTER — Other Ambulatory Visit: Payer: Self-pay

## 2018-04-20 ENCOUNTER — Ambulatory Visit (HOSPITAL_COMMUNITY)
Admission: RE | Admit: 2018-04-20 | Discharge: 2018-04-20 | Disposition: A | Discharge status: Home / Self Care | Payer: BLUE CROSS/BLUE SHIELD | Attending: Pulmonary Disease | Admitting: Pulmonary Disease

## 2018-04-20 DIAGNOSIS — E1122 Type 2 diabetes mellitus with diabetic chronic kidney disease: Secondary | ICD-10-CM | POA: Insufficient documentation

## 2018-04-20 DIAGNOSIS — R918 Other nonspecific abnormal finding of lung field: Secondary | ICD-10-CM

## 2018-04-20 DIAGNOSIS — N189 Chronic kidney disease, unspecified: Secondary | ICD-10-CM | POA: Insufficient documentation

## 2018-04-20 DIAGNOSIS — Z7984 Long term (current) use of oral hypoglycemic drugs: Secondary | ICD-10-CM | POA: Insufficient documentation

## 2018-04-20 DIAGNOSIS — Z79899 Other long term (current) drug therapy: Secondary | ICD-10-CM | POA: Insufficient documentation

## 2018-04-20 DIAGNOSIS — Z791 Long term (current) use of non-steroidal anti-inflammatories (NSAID): Secondary | ICD-10-CM | POA: Insufficient documentation

## 2018-04-20 DIAGNOSIS — C3431 Malignant neoplasm of lower lobe, right bronchus or lung: Secondary | ICD-10-CM | POA: Insufficient documentation

## 2018-04-20 DIAGNOSIS — I129 Hypertensive chronic kidney disease with stage 1 through stage 4 chronic kidney disease, or unspecified chronic kidney disease: Secondary | ICD-10-CM | POA: Insufficient documentation

## 2018-04-20 DIAGNOSIS — C771 Secondary and unspecified malignant neoplasm of intrathoracic lymph nodes: Secondary | ICD-10-CM | POA: Insufficient documentation

## 2018-04-20 DIAGNOSIS — Z87891 Personal history of nicotine dependence: Secondary | ICD-10-CM | POA: Insufficient documentation

## 2018-04-20 HISTORY — PX: VIDEO BRONCHOSCOPY WITH ENDOBRONCHIAL ULTRASOUND: SHX6177

## 2018-04-20 HISTORY — DX: Pneumonia, unspecified organism: J18.9

## 2018-04-20 HISTORY — DX: Malignant (primary) neoplasm, unspecified: C80.1

## 2018-04-20 HISTORY — DX: Chronic kidney disease, unspecified: N18.9

## 2018-04-20 HISTORY — DX: Dyspnea, unspecified: R06.00

## 2018-04-20 LAB — COMPREHENSIVE METABOLIC PANEL
ALT: 32 U/L (ref 0–44)
AST: 18 U/L (ref 15–41)
Albumin: 4 g/dL (ref 3.5–5.0)
Alkaline Phosphatase: 66 U/L (ref 38–126)
Anion gap: 9 (ref 5–15)
BILIRUBIN TOTAL: 0.8 mg/dL (ref 0.3–1.2)
BUN: 15 mg/dL (ref 6–20)
CO2: 24 mmol/L (ref 22–32)
Calcium: 9.7 mg/dL (ref 8.9–10.3)
Chloride: 109 mmol/L (ref 98–111)
Creatinine, Ser: 1.02 mg/dL (ref 0.61–1.24)
GFR calc non Af Amer: >60 mL/min (ref >60)
Glucose, Bld: 163 mg/dL — ABNORMAL HIGH (ref 70–99)
Potassium: 4.4 mmol/L (ref 3.5–5.1)
Sodium: 142 mmol/L (ref 135–145)
TOTAL PROTEIN: 6.9 g/dL (ref 6.5–8.1)

## 2018-04-20 LAB — GLUCOSE, CAPILLARY
Glucose-Capillary: 156 mg/dL — ABNORMAL HIGH (ref 70–99)
Glucose-Capillary: 136 mg/dL — ABNORMAL HIGH (ref 70–99)

## 2018-04-20 LAB — CBC
HCT: 38.8 % — ABNORMAL LOW (ref 39.0–52.0)
Hemoglobin: 12.4 g/dL — ABNORMAL LOW (ref 13.0–17.0)
MCH: 30.5 pg (ref 26.0–34.0)
MCHC: 32 g/dL (ref 30.0–36.0)
MCV: 95.6 fL (ref 80.0–100.0)
Platelets: 254 10*3/uL (ref 150–400)
RBC: 4.06 MIL/uL — ABNORMAL LOW (ref 4.22–5.81)
RDW: 11.4 % — ABNORMAL LOW (ref 11.5–15.5)
WBC: 8.3 10*3/uL (ref 4.0–10.5)
nRBC: 0 % (ref 0.0–0.2)

## 2018-04-20 LAB — APTT: aPTT: 28 seconds (ref 24–36)

## 2018-04-20 LAB — PROTIME-INR
INR: 0.91
Prothrombin Time: 12.2 seconds (ref 11.4–15.2)

## 2018-04-20 SURGERY — BRONCHOSCOPY, WITH EBUS
Anesthesia: General | Laterality: Right

## 2018-04-20 MED ORDER — PROPOFOL 500 MG/50ML IV EMUL
INTRAVENOUS | Status: AC
  Filled 2018-04-20: qty 50

## 2018-04-20 MED ORDER — LACTATED RINGERS IV SOLN
INTRAVENOUS | Status: DC
  Administered 2018-04-20: 12:00:00 via INTRAVENOUS

## 2018-04-20 MED ORDER — DEXAMETHASONE SODIUM PHOSPHATE 10 MG/ML IJ SOLN
INTRAMUSCULAR | Status: AC
  Filled 2018-04-20: qty 1

## 2018-04-20 MED ORDER — SODIUM CHLORIDE 0.9 % IR SOLN
Status: DC | PRN
  Administered 2018-04-20: 2 mL

## 2018-04-20 MED ORDER — SODIUM CHLORIDE 0.9 % IV SOLN
INTRAVENOUS | Status: DC | PRN
  Administered 2018-04-20: 40 ug/min via INTRAVENOUS

## 2018-04-20 MED ORDER — PHENYLEPHRINE 40 MCG/ML (10ML) SYRINGE FOR IV PUSH (FOR BLOOD PRESSURE SUPPORT)
PREFILLED_SYRINGE | INTRAVENOUS | Status: DC | PRN
  Administered 2018-04-20 (×4): 80 ug via INTRAVENOUS

## 2018-04-20 MED ORDER — SUGAMMADEX SODIUM 200 MG/2ML IV SOLN
INTRAVENOUS | Status: DC | PRN
  Administered 2018-04-20: 145.2 mg via INTRAVENOUS

## 2018-04-20 MED ORDER — FENTANYL CITRATE (PF) 100 MCG/2ML IJ SOLN
INTRAMUSCULAR | Status: DC | PRN
  Administered 2018-04-20: 50 ug via INTRAVENOUS

## 2018-04-20 MED ORDER — DEXAMETHASONE SODIUM PHOSPHATE 10 MG/ML IJ SOLN
INTRAMUSCULAR | Status: DC | PRN
  Administered 2018-04-20: 6 mg via INTRAVENOUS

## 2018-04-20 MED ORDER — PROPOFOL 10 MG/ML IV BOLUS
INTRAVENOUS | Status: DC | PRN
  Administered 2018-04-20: 100 mg via INTRAVENOUS

## 2018-04-20 MED ORDER — FENTANYL CITRATE (PF) 250 MCG/5ML IJ SOLN
INTRAMUSCULAR | Status: AC
  Filled 2018-04-20: qty 5

## 2018-04-20 MED ORDER — ROCURONIUM BROMIDE 50 MG/5ML IV SOSY
PREFILLED_SYRINGE | INTRAVENOUS | Status: AC
  Filled 2018-04-20: qty 5

## 2018-04-20 MED ORDER — ONDANSETRON HCL 4 MG/2ML IJ SOLN
INTRAMUSCULAR | Status: AC
  Filled 2018-04-20: qty 2

## 2018-04-20 MED ORDER — ONDANSETRON HCL 4 MG/2ML IJ SOLN
INTRAMUSCULAR | Status: DC | PRN
  Administered 2018-04-20: 4 mg via INTRAVENOUS

## 2018-04-20 MED ORDER — PHENYLEPHRINE 40 MCG/ML (10ML) SYRINGE FOR IV PUSH (FOR BLOOD PRESSURE SUPPORT)
PREFILLED_SYRINGE | INTRAVENOUS | Status: AC
  Filled 2018-04-20: qty 10

## 2018-04-20 MED ORDER — LIDOCAINE 2% (20 MG/ML) 5 ML SYRINGE
INTRAMUSCULAR | Status: DC | PRN
  Administered 2018-04-20: 40 mg via INTRAVENOUS

## 2018-04-20 MED ORDER — EPINEPHRINE PF 1 MG/ML IJ SOLN
INTRAMUSCULAR | Status: AC
  Filled 2018-04-20: qty 2

## 2018-04-20 MED ORDER — LIDOCAINE 2% (20 MG/ML) 5 ML SYRINGE
INTRAMUSCULAR | Status: AC
  Filled 2018-04-20: qty 5

## 2018-04-20 MED ORDER — MIDAZOLAM HCL 2 MG/2ML IJ SOLN
INTRAMUSCULAR | Status: AC
  Filled 2018-04-20: qty 2

## 2018-04-20 MED ORDER — PROPOFOL 10 MG/ML IV BOLUS
INTRAVENOUS | Status: AC
  Filled 2018-04-20: qty 20

## 2018-04-20 MED ORDER — 0.9 % SODIUM CHLORIDE (POUR BTL) OPTIME
TOPICAL | Status: DC | PRN
  Administered 2018-04-20: 1000 mL

## 2018-04-20 MED ORDER — MIDAZOLAM HCL 5 MG/5ML IJ SOLN
INTRAMUSCULAR | Status: DC | PRN
  Administered 2018-04-20: 2 mg via INTRAVENOUS

## 2018-04-20 MED ORDER — ROCURONIUM BROMIDE 50 MG/5ML IV SOSY
PREFILLED_SYRINGE | INTRAVENOUS | Status: DC | PRN
  Administered 2018-04-20: 50 mg via INTRAVENOUS

## 2018-04-20 SURGICAL SUPPLY — 33 items
ADAPTER VALVE BIOPSY EBUS (MISCELLANEOUS) IMPLANT
ADPTR VALVE BIOPSY EBUS (MISCELLANEOUS)
BRUSH CYTOL CELLEBRITY 1.5X140 (MISCELLANEOUS) IMPLANT
CANISTER SUCT 3000ML PPV (MISCELLANEOUS) ×2 IMPLANT
CONT SPEC 4OZ CLIKSEAL STRL BL (MISCELLANEOUS) ×2 IMPLANT
COVER BACK TABLE 60X90IN (DRAPES) ×2 IMPLANT
COVER DOME SNAP 22 D (MISCELLANEOUS) IMPLANT
FILTER STRAW FLUID ASPIR (MISCELLANEOUS) ×2 IMPLANT
FORCEPS BIOP RJ4 1.8 (CUTTING FORCEPS) IMPLANT
FORCEPS RADIAL JAW LRG 4 PULM (INSTRUMENTS) ×1 IMPLANT
GAUZE SPONGE 4X4 12PLY STRL (GAUZE/BANDAGES/DRESSINGS) ×2 IMPLANT
GLOVE BIO SURGEON STRL SZ8 (GLOVE) ×2 IMPLANT
GOWN STRL REUS W/ TWL LRG LVL3 (GOWN DISPOSABLE) ×1 IMPLANT
GOWN STRL REUS W/TWL LRG LVL3 (GOWN DISPOSABLE) ×1
KIT CLEAN ENDO COMPLIANCE (KITS) ×4 IMPLANT
KIT TURNOVER KIT B (KITS) ×2 IMPLANT
MARKER SKIN DUAL TIP RULER LAB (MISCELLANEOUS) ×2 IMPLANT
NEEDLE ASPIRATION VIZISHOT 19G (NEEDLE) IMPLANT
NEEDLE ASPIRATION VIZISHOT 21G (NEEDLE) ×2 IMPLANT
NEEDLE BIOPSY TRANSBRONCH 21G (NEEDLE) ×4 IMPLANT
NS IRRIG 1000ML POUR BTL (IV SOLUTION) ×2 IMPLANT
OIL SILICONE PENTAX (PARTS (SERVICE/REPAIRS)) ×2 IMPLANT
PAD ARMBOARD 7.5X6 YLW CONV (MISCELLANEOUS) ×4 IMPLANT
RADIAL JAW LRG 4 PULMONARY (INSTRUMENTS) ×1
SYR 20CC LL (SYRINGE) ×2 IMPLANT
SYR 20ML ECCENTRIC (SYRINGE) ×4 IMPLANT
SYR 5ML LUER SLIP (SYRINGE) ×4 IMPLANT
TOWEL OR 17X24 6PK STRL BLUE (TOWEL DISPOSABLE) ×2 IMPLANT
TRAP SPECIMEN MUCOUS 40CC (MISCELLANEOUS) IMPLANT
TUBE CONNECTING 20X1/4 (TUBING) ×2 IMPLANT
VALVE BIOPSY  SINGLE USE (MISCELLANEOUS) ×1
VALVE BIOPSY SINGLE USE (MISCELLANEOUS) ×1 IMPLANT
VALVE SUCTION BRONCHIO DISP (MISCELLANEOUS) ×2 IMPLANT

## 2018-04-20 NOTE — H&P (Signed)
[facility]  HPI: 63 y/o smoker presented to me in pulmonary clinic last week for the new finding of a RLL mass.  PET CT showed a station 4R lymph node which had some increased FDG activity.  Plan EBUS today, feels OK today.  Past Medical History:  Diagnosis Date  . Arthritis   . Cancer ([place])    lung  . Chronic kidney disease   . Diabetes mellitus    TYPE II  . Dyspnea    at times  . Hypertension   . Pneumonia    "years ago"     Family History  Problem Relation Age of Onset  . Leukemia Sister   . Cancer Brother      Social History   Socioeconomic History  . Marital status: Single    Spouse name: Not on file  . Number of children: Not on file  . Years of education: Not on file  . Highest education level: Not on file  Occupational History  . Not on file  Social Needs  . Financial resource strain: Not on file  . Food insecurity:    Worry: Not on file    Inability: Not on file  . Transportation needs:    Medical: Not on file    Non-medical: Not on file  Tobacco Use  . Smoking status: Former Smoker    Packs/day: 1.00    Years: 45.00    Pack years: 45.00    Types: Cigarettes    Last attempt to quit: 03/31/2018    Years since quitting: 0.0  . Smokeless tobacco: Former Systems developer    Types: [place] date: 03/25/1973  Substance and Sexual Activity  . Alcohol use: Yes    Alcohol/week: 2.0 standard drinks    Types: 2 Shots of liquor per week  . Drug use: No  . Sexual activity: Not on file  Lifestyle  . Physical activity:    Days per week: Not on file    Minutes per session: Not on file  . Stress: Not on file  Relationships  . Social connections:    Talks on phone: Not on file    Gets together: Not on file    Attends religious service: Not on file    Active member of club or organization: Not on file    Attends meetings of clubs or organizations: Not on file    Relationship status: Not on file  . Intimate partner violence:    Fear of current or ex partner: Not on  file    Emotionally abused: Not on file    Physically abused: Not on file    Forced sexual activity: Not on file  Other Topics Concern  . Not on file  Social History Narrative  . Not on file     No Known Allergies   @encmedstart @  Vitals:   04/20/18 1109  BP: (!) 145/90  Pulse: (!) 110  Resp: 18  Temp: (!) 97.5 F (36.4 C)  TempSrc: Oral  SpO2: 100%  Weight: 72.6 kg  Height: 5\' 11"  (1.803 m)   General:  Resting comfortably in bed HENT: NCAT OP clear PULM: CTA B, normal effort CV: RRR, no mgr GI: BS+, soft, nontender MSK: normal bulk and tone Neuro: awake, alert, no distress, MAEW  CBC    Component Value Date/Time   WBC 6.7 04/09/2018 1201   WBC 6.7 03/31/2018 1142   RBC 4.15 (L) 04/09/2018 1201   HGB 13.1 04/09/2018 1201  HCT 40.9 04/09/2018 1201   PLT 267 04/09/2018 1201   MCV 98.6 04/09/2018 1201   MCH 31.6 04/09/2018 1201   MCHC 32.0 04/09/2018 1201   RDW 11.9 04/09/2018 1201   LYMPHSABS 2.2 04/09/2018 1201   MONOABS 0.5 04/09/2018 1201   EOSABS 0.3 04/09/2018 1201   BASOSABS 0.0 04/09/2018 1201    BMET    Component Value Date/Time   NA 145 04/09/2018 1201   K 5.2 (H) 04/09/2018 1201   CL 107 04/09/2018 1201   CO2 29 04/09/2018 1201   GLUCOSE 185 (H) 04/09/2018 1201   BUN 24 (H) 04/09/2018 1201   CREATININE 1.42 (H) 04/09/2018 1201   CALCIUM 10.2 04/09/2018 1201   GFRNONAA 53 (L) 04/09/2018 1201   GFRAA >60 04/09/2018 1201    Impression/plan  Lung mass with mediastinal lymphadenopathy: plan EBUS and bronchosocpy under general anesthesia.  Explained risks and benefits and he is willing to proceed.  Roselie Awkward, MD [place] [facility] Pager: [phone] Cell: [phone] If no response, call [phone]

## 2018-04-20 NOTE — Anesthesia Procedure Notes (Addendum)
Procedure Name: Intubation Date/Time: 04/20/2018 1:11 PM Performed by: Orlie Dakin, CRNA Pre-anesthesia Checklist: Emergency Drugs available, Patient identified, Suction available and Patient being monitored Patient Re-evaluated:Patient Re-evaluated prior to induction Oxygen Delivery Method: Circle system utilized Preoxygenation: Pre-oxygenation with 100% oxygen Induction Type: IV induction Ventilation: Mask ventilation without difficulty Laryngoscope Size: Mac and 4 Grade View: Grade II Tube type: Oral Tube size: 8.5 mm Number of attempts: 1 Airway Equipment and Method: Stylet Placement Confirmation: ETT inserted through vocal cords under direct vision,  positive ETCO2 and breath sounds checked- equal and bilateral Secured at: 23 cm Tube secured with: Tape Dental Injury: Teeth and Oropharynx as per pre-operative assessment

## 2018-04-20 NOTE — Transfer of Care (Signed)
Immediate Anesthesia Transfer of Care Note  Patient: Travis [name]  Procedure(s) Performed: VIDEO BRONCHOSCOPY WITH ENDOBRONCHIAL ULTRASOUND (Right )  Patient Location: PACU  Anesthesia Type:General  Level of Consciousness: awake, alert  and oriented  Airway & Oxygen Therapy: Patient Spontanous Breathing and Patient connected to face mask oxygen  Post-op Assessment: Report given to RN and Post -op Vital signs reviewed and stable  Post vital signs: Reviewed and stable  Last Vitals:  Vitals Value Taken Time  BP 136/87 04/20/2018  2:22 PM  Temp 36.7 C 04/20/2018  2:22 PM  Pulse 98 04/20/2018  2:24 PM  Resp 28 04/20/2018  2:24 PM  SpO2 99 % 04/20/2018  2:24 PM  Vitals shown include unvalidated device data.  Last Pain:  Vitals:   04/20/18 1117  TempSrc:   PainSc: 0-No pain         Complications: No apparent anesthesia complications

## 2018-04-20 NOTE — Op Note (Signed)
Video Bronchoscopy with Endobronchial Ultrasound Procedure Note  Date of Operation: 04/20/2018  Pre-op Diagnosis: Lung mass, mediastinal adenopathy  Post-op Diagnosis: Lung Cancer  Surgeon: Roselie Awkward  Assistants: none  Anesthesia: General endotracheal anesthesia  Operation: Flexible video fiberoptic bronchoscopy with endobronchial ultrasound and biopsies.  Estimated Blood Loss: Less than 93TD  Complications: none immediate  Indications and History: Travis [name] is a 63 y.o. male with a history of tobacco abuse who presented to the pulmonary clinic about a week ago after a CT chest showed a lung mass.  A follow up PET CT showed some mild uptake in a small lymph node in the 4R station.  The risks, benefits, complications, treatment options and expected outcomes were discussed with the patient.  The possibilities of pneumothorax, pneumonia, reaction to medication, pulmonary aspiration, perforation of a viscus, bleeding, failure to diagnose a condition and creating a complication requiring transfusion or operation were discussed with the patient who freely signed the consent.    Description of Procedure: The patient was examined in the preoperative area and history and data from the preprocedure consultation were reviewed. It was deemed appropriate to proceed.  The patient was taken to OR 10, identified as Travis [name] and the procedure verified as Flexible Video Fiberoptic Bronchoscopy.  A Time Out was held and the above information confirmed. After being taken to the operating room general anesthesia was initiated and the patient  was orally intubated. The video fiberoptic bronchoscope was introduced via the endotracheal tube and a general inspection was performed which showed a mass in the most medial segment of the superior segment of the right lower lobe. The standard scope was then withdrawn and the endobronchial ultrasound was used to identify and characterize the peritracheal,  hilar and bronchial lymph nodes. Inspection showed that the only enlarged lymph node was station 4R, just barely 1cm in size.  Using real-time ultrasound guidance Olympus needle biopsies were take from Station 4R nodes and were sent for cytology. The patient tolerated the procedure well without apparent complications. After I re-inserted the conventional bronchoscope and performed needle biopsies and endobronchial biopsies of the right lower lobe mass.  There was minor bleeding controlled with topical epinephrine.  There was no significant blood loss. The bronchoscope was withdrawn. Anesthesia was reversed and the patient was taken to the PACU for recovery.   Samples: 1. Wang needle biopsies from 4R node 2. Wang needle biopsies from the right lower lobe mass 3. Endobronchial biopsies from the right lower lobe mass   Plans:  The patient will be discharged from the PACU to home when recovered from anesthesia. We will review the cytology, pathology and microbiology results with the patient when they become available. Outpatient followup will be with Lake Bells and Maylon Peppers.    Roselie Awkward, MD [place] [facility] Pager: [phone] Cell: [phone] If no response, call [phone] 04/20/2018

## 2018-04-20 NOTE — Anesthesia Postprocedure Evaluation (Signed)
Anesthesia Post Note  Patient: Travis [name]  Procedure(s) Performed: VIDEO BRONCHOSCOPY WITH ENDOBRONCHIAL ULTRASOUND (Right )     Patient location during evaluation: PACU Anesthesia Type: General Level of consciousness: awake and alert, patient cooperative and oriented Pain management: pain level controlled Vital Signs Assessment: post-procedure vital signs reviewed and stable Respiratory status: spontaneous breathing, nonlabored ventilation, respiratory function stable and patient connected to nasal cannula oxygen Cardiovascular status: blood pressure returned to baseline and stable Postop Assessment: no apparent nausea or vomiting Anesthetic complications: no    Last Vitals:  Vitals:   04/20/18 1437 04/20/18 1446  BP: 113/76 126/75  Pulse: (!) 104 (!) 102  Resp: 16 19  Temp:  36.7 C  SpO2: 98% 98%    Last Pain:  Vitals:   04/20/18 1446  TempSrc:   PainSc: 0-No pain                 Nyana Haren,E. Perlie Scheuring

## 2018-04-21 ENCOUNTER — Other Ambulatory Visit: Payer: Self-pay | Admitting: Hematology

## 2018-04-21 ENCOUNTER — Encounter (HOSPITAL_COMMUNITY): Payer: Self-pay | Admitting: Pulmonary Disease

## 2018-04-21 ENCOUNTER — Telehealth: Payer: Self-pay | Admitting: Hematology

## 2018-04-21 DIAGNOSIS — C3431 Malignant neoplasm of lower lobe, right bronchus or lung: Secondary | ICD-10-CM

## 2018-04-21 NOTE — Telephone Encounter (Signed)
Called and spoke with patient regarding appointment for PFT's .  I also informed him that he was Not to have any CAFFING, Use any Inhalers/and NO SMOKING per 1/28 staff message

## 2018-04-22 ENCOUNTER — Ambulatory Visit (HOSPITAL_COMMUNITY)
Admission: RE | Admit: 2018-04-22 | Discharge: 2018-04-22 | Disposition: A | Discharge status: Home / Self Care | Payer: BLUE CROSS/BLUE SHIELD | Source: Ambulatory Visit | Attending: Hematology | Admitting: Hematology

## 2018-04-22 DIAGNOSIS — C3431 Malignant neoplasm of lower lobe, right bronchus or lung: Secondary | ICD-10-CM | POA: Insufficient documentation

## 2018-04-22 MED ORDER — ALBUTEROL SULFATE (2.5 MG/3ML) 0.083% IN NEBU
2.5000 mg | INHALATION_SOLUTION | Freq: Once | RESPIRATORY_TRACT | Status: AC
  Administered 2018-04-22: 2.5 mg via RESPIRATORY_TRACT

## 2018-04-23 ENCOUNTER — Ambulatory Visit: Payer: BLUE CROSS/BLUE SHIELD | Admitting: Adult Health

## 2018-04-23 ENCOUNTER — Encounter: Payer: Self-pay | Admitting: Adult Health

## 2018-04-23 DIAGNOSIS — R918 Other nonspecific abnormal finding of lung field: Secondary | ICD-10-CM

## 2018-04-23 DIAGNOSIS — J449 Chronic obstructive pulmonary disease, unspecified: Secondary | ICD-10-CM

## 2018-04-23 DIAGNOSIS — C3431 Malignant neoplasm of lower lobe, right bronchus or lung: Secondary | ICD-10-CM

## 2018-04-23 DIAGNOSIS — Z720 Tobacco use: Secondary | ICD-10-CM | POA: Diagnosis not present

## 2018-04-23 LAB — PULMONARY FUNCTION TEST
DL/VA % pred: 48 %
DL/VA: 2.29 ml/min/mmHg/L
DLCO cor % pred: 31 %
DLCO cor: 11.12 ml/min/mmHg
DLCO unc % pred: 29 %
DLCO unc: 10.37 ml/min/mmHg
FEF 25-75 Post: 1.84 L/sec
FEF 25-75 Pre: 2.25 L/sec
FEF2575-%Change-Post: -18 %
FEF2575-%Pred-Post: 59 %
FEF2575-%Pred-Pre: 72 %
FEV1-%Change-Post: -2 %
FEV1-%Pred-Post: 68 %
FEV1-%Pred-Pre: 70 %
FEV1-Post: 2.31 L
FEV1-Pre: 2.37 L
FEV1FVC-%Change-Post: -6 %
FEV1FVC-%Pred-Pre: 96 %
FEV6-%Change-Post: 6 %
FEV6-%Pred-Post: 76 %
FEV6-%Pred-Pre: 72 %
FEV6-Post: 3.21 L
FEV6-Pre: 3 L
FEV6FVC-%Change-Post: -2 %
FEV6FVC-%Pred-Post: 101 %
FEV6FVC-%Pred-Pre: 103 %
FVC-%Change-Post: 4 %
FVC-%Pred-Post: 75 %
FVC-%Pred-Pre: 72 %
FVC-Post: 3.27 L
FVC-Pre: 3.13 L
Post FEV1/FVC ratio: 70 %
Post FEV6/FVC ratio: 98 %
Pre FEV1/FVC ratio: 76 %
Pre FEV6/FVC Ratio: 100 %
RV % pred: 114 %
RV: 2.69 L
TLC % pred: 79 %
TLC: 5.84 L

## 2018-04-23 NOTE — Patient Instructions (Signed)
Follow up with Oncology tomorrow .  I will call with Pulmonary Function test results  Follow up with Dr. Lake Bells or Parrett NP in 2 months at [facility] .  Please contact office for sooner follow up if symptoms do not improve or worsen or seek emergency care

## 2018-04-23 NOTE — Progress Notes (Signed)
[place] OFFICE PROGRESS NOTE  Patient Care Team: Patient, No Pcp Per as PCP - General (General Practice)  HEME/ONC OVERVIEW: 1. Stage IIB (cT3N0M0) adenocarcinoma of the right lung  -03/2018: CT CAP showed a 4.2 x 3.3 x 3.1cm RLL mass extending to RUL, additional small noncalcified nodules throughout the right lung, concerning for lung metastases; no metastatic disease in the abdomen/pelvis   -PET showed FDG-avid RLL mass 5.3 x 3.6cm and a satellite nodule in RLL; borderline FDG-avid (SUV 2.6) paratracheal LN   -MRI brain negative for mets   -EBUS w/ bx of the RLL mass showed poorly differentiated adenoCa with features of coal squamous cell differentiation; 4R LN negative for malignancy  PERTINENT NON-HEM/ONC PROBLEMS: 1. Hx of tobacco use, quit in early 03/2018  ASSESSMENT & PLAN:   Stage IIB (cT3N0M0) adenocarcinoma of the right lung  -I independently reviewed the radiologic images of PET and MRI brain, and agree with the findings as documented -In summary, PET showed FDG avid RLL mass and a second satellite nodule within RLL as well as borderline FDG-avid paratracheal lymph node; MRI brain was negative for mid to static disease -Patient underwent EBUS with biopsy of the dominant RLL mass, which showed poorly differentiated adenocarcinoma; bx of 4R LN negative for malignancy -I discussed case at length with Dr. Roxan Hockey of thoracic surgery; due to his poor PFT's, he is not a candidate for surgery, as the large RLL mass would require bilobectomy  -Therefore, I will refer the patient to radiation oncology for concurrent chemoradiation -We reviewed the NCCN guidelines -We discussed the role of chemotherapy. The goal of treatment is for curative intent. -We discussed some of the risks, benefits, side-effects of carboplatin and paclitaxel. The treatment is weekly x 7 weeks.  -Some of the short term side-effects included, though not limited to, including weight loss, life  threatening infections, risk of allergic reactions, need for transfusions of blood products, nausea, vomiting, change in bowel habits, loss of hair, admission to hospital for various reasons, and risks of death.  -Long term side-effects are also discussed including risks of infertility, permanent damage to nerve function, hearing loss, chronic fatigue, kidney damage with possibility needing hemodialysis, and rare secondary malignancy including bone marrow disorders. -The patient is aware that the response rates discussed earlier is not guaranteed.  After a long discussion, patient made an informed decision to proceed with the prescribed plan of care.  -I have also ordered port for chemotherapy administration -PRN medications: Zofran, Compazine, Ativan   Hx of tobacco use -Patient reports that he has mostly stopped smoking except an occasional cigarette -I counseled patient on the importance of smoking cessation, especially as he is getting ready to start chemoradiation  Orders Placed This Encounter  Procedures  . IR IMAGING GUIDED PORT INSERTION    Standing Status:   Future    Standing Expiration Date:   06/23/2019    Order Specific Question:   Reason for Exam (SYMPTOM  OR DIAGNOSIS REQUIRED)    Answer:   Lung cancer starting chemoradiation    Order Specific Question:   Preferred Imaging Location?    Answer:   [facility]  . CBC with Differential ([place] Only)    Standing Status:   Standing    Number of Occurrences:   20    Standing Expiration Date:   04/25/2019  . CMP ([place] only)    Standing Status:   Standing    Number of Occurrences:   20  Standing Expiration Date:   04/25/2019  . Ambulatory referral to Radiation Oncology    Referral Priority:   Urgent    Referral Type:   Consultation    Referral Reason:   Specialty Services Required    Referred to Provider:   Kyung Rudd, MD    Requested Specialty:   Radiation Oncology    Number of Visits Requested:   1    All questions were answered. The patient knows to call the clinic with any problems, questions or concerns. No barriers to learning was detected.  A total of more than 40 minutes were spent face-to-face with the patient during this encounter and over half of that time was spent on counseling and coordination of care as outlined above.   Chemotherapy education on 05/07/2018. Labs, port flush, chemo infusion and clinic appt (okay to see in infusion) on 05/08/2018.   Tish Men, MD 04/24/2018 4:19 PM  CHIEF COMPLAINT: "I am doing okay "  INTERVAL HISTORY: [name] return to clinic for follow-up of recent biopsy results.  He reports that he has chronic, intermittent tingling sensation in the fingertips on the bottom of the feet, but denies any persistent neuropathic symptom or interfering with his ADLs.  He denies any other new symptoms, such as fever, chill, night sweats, weight change, chest pain, dyspnea, abdominal pain, nausea, vomiting, or diarrhea.  SUMMARY OF ONCOLOGIC HISTORY:   Lung cancer ([place])   03/31/2018 Imaging    CT CAP with contrast: IMPRESSION: 1. 4.2 x 3.3 x 3.1 cm irregularly marginated mass within the superior segment of the right lower lobe appearing to extend across the fissure into the posterior inferior right upper lobe and extending toward the right hilum where the right upper lobe superior segment bronchus is occluded, consistent with primary lung carcinoma. 2. Small noncalcified nodules more numerous throughout the right lung most consistent with lung metastases. 3. No metastatic involvement of the abdomen or pelvis by CT. 4. Moderate thoracic and abdominal aortic atherosclerosis.    04/03/2018 Initial Diagnosis    Lung cancer ([place])    05/08/2018 -  Chemotherapy    The patient had palonosetron (ALOXI) injection 0.25 mg, 0.25 mg, Intravenous,  Once, 0 of 7 cycles CARBOplatin (PARAPLATIN) 220 mg in sodium chloride 0.9 % 100 mL chemo infusion, 220 mg (100 % of  original dose 215.8 mg), Intravenous,  Once, 0 of 7 cycles Dose modification:   (original dose 215.8 mg, Cycle 1) PACLitaxel (TAXOL) 90 mg in sodium chloride 0.9 % 250 mL chemo infusion (</= 80mg /m2), 45 mg/m2 = 90 mg, Intravenous,  Once, 0 of 7 cycles  for chemotherapy treatment.      REVIEW OF SYSTEMS:   Constitutional: ( - ) fevers, ( - )  chills , ( - ) night sweats Eyes: ( - ) blurriness of vision, ( - ) double vision, ( - ) watery eyes Ears, nose, mouth, throat, and face: ( - ) mucositis, ( - ) sore throat Respiratory: ( - ) cough, ( - ) dyspnea, ( - ) wheezes Cardiovascular: ( - ) palpitation, ( - ) chest discomfort, ( - ) lower extremity swelling Gastrointestinal:  ( - ) nausea, ( - ) heartburn, ( - ) change in bowel habits Skin: ( - ) abnormal skin rashes Lymphatics: ( - ) new lymphadenopathy, ( - ) easy bruising Neurological: ( - ) numbness, ( + ) tingling, ( - ) new weaknesses Behavioral/Psych: ( - ) mood change, ( - ) new changes  All other systems were reviewed with the patient and are negative.  I have reviewed the past medical history, past surgical history, social history and family history with the patient and they are unchanged from previous note.  ALLERGIES:  has No Known Allergies.  MEDICATIONS:  Current Outpatient Medications  Medication Sig Dispense Refill  . amitriptyline (ELAVIL) 25 MG tablet Take 50 mg by mouth at bedtime.    Marland Kitchen amLODipine (NORVASC) 5 MG tablet Take 5 mg by mouth daily.    . diclofenac (VOLTAREN) 75 MG EC tablet Take 75 mg by mouth 2 (two) times daily.    . metFORMIN (GLUCOPHAGE-XR) 500 MG 24 hr tablet Take 1,000 mg by mouth 2 (two) times daily.    Marland Kitchen dexamethasone (DECADRON) 4 MG tablet Take 2 tablets (8 mg total) by mouth daily. Start the day after chemotherapy for 2 days. 30 tablet 1  . lidocaine-prilocaine (EMLA) cream Apply to affected area once 30 g 3  . LORazepam (ATIVAN) 0.5 MG tablet Take 1 tablet (0.5 mg total) by mouth every 6 (six)  hours as needed (Nausea or vomiting). 30 tablet 0  . ondansetron (ZOFRAN) 8 MG tablet Take 1 tablet (8 mg total) by mouth 2 (two) times daily as needed for refractory nausea / vomiting. Start on day 3 after chemo. 30 tablet 1  . prochlorperazine (COMPAZINE) 10 MG tablet Take 1 tablet (10 mg total) by mouth every 6 (six) hours as needed (Nausea or vomiting). 30 tablet 1   No current facility-administered medications for this visit.     PHYSICAL EXAMINATION: ECOG PERFORMANCE STATUS: 0 - Asymptomatic  Today's Vitals   04/24/18 1537 04/24/18 1538  BP: (!) 149/81   Pulse: 99   Resp: 16   Temp: (!) 97.4 F (36.3 C)   TempSrc: Oral   SpO2: 100%   Weight: 167 lb 12.8 oz (76.1 kg)   Height: 5\' 11"  (1.803 m)   PainSc: 0-No pain 0-No pain   Body mass index is 23.4 kg/m.  Filed Weights   04/24/18 1537  Weight: 167 lb 12.8 oz (76.1 kg)    GENERAL: alert, no distress and comfortable, thin, older than stated age  SKIN: skin color, texture, turgor are normal, no rashes or significant lesions EYES: conjunctiva are pink and non-injected, sclera clear OROPHARYNX: no exudate, no erythema; lips, buccal mucosa, and tongue normal  NECK: supple, non-tender LYMPH:  no palpable lymphadenopathy in the cervical or axillary  LUNGS: clear to auscultation with normal breathing effort HEART: regular rate & rhythm and no murmurs and no lower extremity edema ABDOMEN: soft, non-tender, non-distended, normal bowel sounds Musculoskeletal: no cyanosis of digits and no clubbing  PSYCH: alert & oriented x 3, fluent speech NEURO: no focal motor/sensory deficits  LABORATORY DATA:  I have reviewed the data as listed    Component Value Date/Time   NA 142 04/20/2018 1141   K 4.4 04/20/2018 1141   CL 109 04/20/2018 1141   CO2 24 04/20/2018 1141   GLUCOSE 163 (H) 04/20/2018 1141   BUN 15 04/20/2018 1141   CREATININE 1.02 04/20/2018 1141   CREATININE 1.42 (H) 04/09/2018 1201   CALCIUM 9.7 04/20/2018 1141    PROT 6.9 04/20/2018 1141   ALBUMIN 4.0 04/20/2018 1141   AST 18 04/20/2018 1141   AST 9 (L) 04/09/2018 1201   ALT 32 04/20/2018 1141   ALT 11 04/09/2018 1201   ALKPHOS 66 04/20/2018 1141   BILITOT 0.8 04/20/2018 1141   BILITOT 0.7 04/09/2018 1201  GFRNONAA >60 04/20/2018 1141   GFRNONAA 53 (L) 04/09/2018 1201   GFRAA >60 04/20/2018 1141   GFRAA >60 04/09/2018 1201    No results found for: SPEP, UPEP  Lab Results  Component Value Date   WBC 8.3 04/20/2018   NEUTROABS 3.7 04/09/2018   HGB 12.4 (L) 04/20/2018   HCT 38.8 (L) 04/20/2018   MCV 95.6 04/20/2018   PLT 254 04/20/2018      Chemistry      Component Value Date/Time   NA 142 04/20/2018 1141   K 4.4 04/20/2018 1141   CL 109 04/20/2018 1141   CO2 24 04/20/2018 1141   BUN 15 04/20/2018 1141   CREATININE 1.02 04/20/2018 1141   CREATININE 1.42 (H) 04/09/2018 1201      Component Value Date/Time   CALCIUM 9.7 04/20/2018 1141   ALKPHOS 66 04/20/2018 1141   AST 18 04/20/2018 1141   AST 9 (L) 04/09/2018 1201   ALT 32 04/20/2018 1141   ALT 11 04/09/2018 1201   BILITOT 0.8 04/20/2018 1141   BILITOT 0.7 04/09/2018 1201       RADIOGRAPHIC STUDIES: I have personally reviewed the radiological images as listed below and agreed with the findings in the report. Dg Chest 2 View  Result Date: 03/31/2018 CLINICAL DATA:  Cough and short of breath EXAM: CHEST - 2 VIEW COMPARISON:  04/20/2016 FINDINGS: Patchy 3 cm right perihilar airspace density is new. Left lung is clear. No effusion. Heart size and vascularity normal. IMPRESSION: New right perihilar density. Probable pneumonia however mass lesion is possible. Followup PA and lateral chest X-ray is recommended in 3-4 weeks following trial of antibiotic therapy to ensure resolution and exclude underlying malignancy. Electronically Signed   By: Franchot Gallo M.D.   On: 03/31/2018 13:01   Ct Chest W Contrast  Result Date: 03/31/2018 CLINICAL DATA:  Weight loss unintended,  abdominal pain, anorexia, some dizziness and high blood pressure today, right shoulder pain EXAM: CT CHEST, ABDOMEN, AND PELVIS WITH CONTRAST TECHNIQUE: Multidetector CT imaging of the chest, abdomen and pelvis was performed following the standard protocol during bolus administration of intravenous contrast. CONTRAST:  134mL ISOVUE-300 IOPAMIDOL (ISOVUE-300) INJECTION 61% COMPARISON:  Chest x-ray of 03/31/2018 FINDINGS: CT CHEST FINDINGS Cardiovascular: Moderate thoracic aortic atherosclerosis is present. The heart is mildly enlarged. No significant pericardial effusion is seen. There is good opacification of the pulmonary arteries and no evidence of embolism is noted. The thoracic aorta also is well opacified with no acute abnormality noted. The mid ascending thoracic aorta measures 33 mm in diameter. Mediastinum/Nodes: There may be right hilar node on image 36 series 2 measuring 10 mm in diameter. No other definite mediastinal or hilar adenopathy is seen with a small precarinal node of 7 mm. Only small mediastinal lymph nodes are present. The thyroid gland is unremarkable. No abnormality of the esophagus is seen by CT. Lungs/Pleura: On lung window images, there is and irregularly marginated solid-appearing mass within the right upper lobe extending toward the hilum. This mass appears to cross the major fissure into the right upper lobe posterior medially. The bronchus to the superior segment of the right lower lobe is occluded presumably by tumor. This lesion measures 4.2 x 3.3 cm with height of approximate 3.1 cm. In addition, there are multiple scattered lung nodules primarily throughout the right lung. The largest nodule is in the superior segment of the right lower lobe as well on image 97 series 6 measuring 9 mm in diameter. Additional nodules are scattered  primarily throughout the right lung a between 3 and 7 mm. Two nodules are present within the left lung which appear pleural based of 3 mm in diameter on  image 134 and 3 mm in diameter on image 143 both in the left lower lobe. A nodular opacity adjacent to the major fissure on the left on image 119 probably represents perifissural lymph node. Musculoskeletal: There are degenerative changes in the lower cervical, upper thoracic, and lower thoracic spine. No lytic or blastic bone lesion is seen. The sternum is unremarkable other than degenerative change. CT ABDOMEN PELVIS FINDINGS Hepatobiliary: The liver enhances with no focal abnormality noted. No ductal dilatation is seen. Gallbladder is somewhat contracted and no calcified gallstones are seen. Pancreas: The pancreas is normal in size in the pancreatic duct is not dilated. Spleen: The spleen is somewhat inhomogeneous in enhancement, but no suspicious focal abnormality is noted. Adrenals/Urinary Tract: The adrenal glands appear normal. The kidneys enhance with no calculus or mass. No hydronephrosis is seen on delayed images. The pelvocaliceal systems are unremarkable. The ureters appear normal in caliber. The urinary bladder is moderately urine distended with no abnormality noted. Stomach/Bowel: The stomach is distended with fluid and food debris. No abnormality is seen. No distention of small bowel is seen and no edema is noted. No abnormality of the colon is seen. The terminal ileum and the appendix are unremarkable. Vascular/Lymphatic: Moderate abdominal aortic atherosclerosis is noted. No adenopathy is seen. Reproductive: The prostate is normal in size. Other: No abdominal wall hernia is noted, other than a tiny umbilical hernia containing only fat. Musculoskeletal: The lumbar vertebrae are in normal alignment with mild degenerative change. There is degenerative change involve the facet joints of L4-5 and L5-S1. No lytic or blastic bony lesion is seen. The SI joints appear corticated IMPRESSION: 1. 4.2 x 3.3 x 3.1 cm irregularly marginated mass within the superior segment of the right lower lobe appearing to  extend across the fissure into the posterior inferior right upper lobe and extending toward the right hilum where the right upper lobe superior segment bronchus is occluded, consistent with primary lung carcinoma. 2. Small noncalcified nodules more numerous throughout the right lung most consistent with lung metastases. 3. No metastatic involvement of the abdomen or pelvis by CT. 4. Moderate thoracic and abdominal aortic atherosclerosis. Electronically Signed   By: Ivar Drape M.D.   On: 03/31/2018 14:16   Mr Jeri Cos KW Contrast  Result Date: 04/18/2018 CLINICAL DATA:  Lung mass.  Intracranial staging EXAM: MRI HEAD WITHOUT AND WITH CONTRAST TECHNIQUE: Multiplanar, multiecho pulse sequences of the brain and surrounding structures were obtained without and with intravenous contrast. CONTRAST:  7.2 cc Gadavist intravenous COMPARISON:  08/25/2017 head CT FINDINGS: Brain: No mass or swelling to suggest metastatic disease. No infarction, hemorrhage, hydrocephalus, extra-axial collection or mass lesion. Vascular: Normal flow voids. Skull and upper cervical spine: Normal marrow signal. Sinuses/Orbits: Negative IMPRESSION: Negative for brain metastasis. Electronically Signed   By: Monte Fantasia M.D.   On: 04/18/2018 13:50   Ct Abdomen Pelvis W Contrast  Result Date: 03/31/2018 CLINICAL DATA:  Weight loss unintended, abdominal pain, anorexia, some dizziness and high blood pressure today, right shoulder pain EXAM: CT CHEST, ABDOMEN, AND PELVIS WITH CONTRAST TECHNIQUE: Multidetector CT imaging of the chest, abdomen and pelvis was performed following the standard protocol during bolus administration of intravenous contrast. CONTRAST:  152mL ISOVUE-300 IOPAMIDOL (ISOVUE-300) INJECTION 61% COMPARISON:  Chest x-ray of 03/31/2018 FINDINGS: CT CHEST FINDINGS Cardiovascular: Moderate thoracic aortic  atherosclerosis is present. The heart is mildly enlarged. No significant pericardial effusion is seen. There is good  opacification of the pulmonary arteries and no evidence of embolism is noted. The thoracic aorta also is well opacified with no acute abnormality noted. The mid ascending thoracic aorta measures 33 mm in diameter. Mediastinum/Nodes: There may be right hilar node on image 36 series 2 measuring 10 mm in diameter. No other definite mediastinal or hilar adenopathy is seen with a small precarinal node of 7 mm. Only small mediastinal lymph nodes are present. The thyroid gland is unremarkable. No abnormality of the esophagus is seen by CT. Lungs/Pleura: On lung window images, there is and irregularly marginated solid-appearing mass within the right upper lobe extending toward the hilum. This mass appears to cross the major fissure into the right upper lobe posterior medially. The bronchus to the superior segment of the right lower lobe is occluded presumably by tumor. This lesion measures 4.2 x 3.3 cm with height of approximate 3.1 cm. In addition, there are multiple scattered lung nodules primarily throughout the right lung. The largest nodule is in the superior segment of the right lower lobe as well on image 97 series 6 measuring 9 mm in diameter. Additional nodules are scattered primarily throughout the right lung a between 3 and 7 mm. Two nodules are present within the left lung which appear pleural based of 3 mm in diameter on image 134 and 3 mm in diameter on image 143 both in the left lower lobe. A nodular opacity adjacent to the major fissure on the left on image 119 probably represents perifissural lymph node. Musculoskeletal: There are degenerative changes in the lower cervical, upper thoracic, and lower thoracic spine. No lytic or blastic bone lesion is seen. The sternum is unremarkable other than degenerative change. CT ABDOMEN PELVIS FINDINGS Hepatobiliary: The liver enhances with no focal abnormality noted. No ductal dilatation is seen. Gallbladder is somewhat contracted and no calcified gallstones are seen.  Pancreas: The pancreas is normal in size in the pancreatic duct is not dilated. Spleen: The spleen is somewhat inhomogeneous in enhancement, but no suspicious focal abnormality is noted. Adrenals/Urinary Tract: The adrenal glands appear normal. The kidneys enhance with no calculus or mass. No hydronephrosis is seen on delayed images. The pelvocaliceal systems are unremarkable. The ureters appear normal in caliber. The urinary bladder is moderately urine distended with no abnormality noted. Stomach/Bowel: The stomach is distended with fluid and food debris. No abnormality is seen. No distention of small bowel is seen and no edema is noted. No abnormality of the colon is seen. The terminal ileum and the appendix are unremarkable. Vascular/Lymphatic: Moderate abdominal aortic atherosclerosis is noted. No adenopathy is seen. Reproductive: The prostate is normal in size. Other: No abdominal wall hernia is noted, other than a tiny umbilical hernia containing only fat. Musculoskeletal: The lumbar vertebrae are in normal alignment with mild degenerative change. There is degenerative change involve the facet joints of L4-5 and L5-S1. No lytic or blastic bony lesion is seen. The SI joints appear corticated IMPRESSION: 1. 4.2 x 3.3 x 3.1 cm irregularly marginated mass within the superior segment of the right lower lobe appearing to extend across the fissure into the posterior inferior right upper lobe and extending toward the right hilum where the right upper lobe superior segment bronchus is occluded, consistent with primary lung carcinoma. 2. Small noncalcified nodules more numerous throughout the right lung most consistent with lung metastases. 3. No metastatic involvement of the abdomen or  pelvis by CT. 4. Moderate thoracic and abdominal aortic atherosclerosis. Electronically Signed   By: Ivar Drape M.D.   On: 03/31/2018 14:16   Nm Pet Image Initial (pi) Skull Base To Thigh  Result Date: 04/14/2018 CLINICAL DATA:   Initial treatment strategy for right lung mass. EXAM: NUCLEAR MEDICINE PET SKULL BASE TO THIGH TECHNIQUE: 8.0 mCi F-18 FDG was injected intravenously. Full-ring PET imaging was performed from the skull base to thigh after the radiotracer. CT data was obtained and used for attenuation correction and anatomic localization. Fasting blood glucose: 133 mg/dl COMPARISON:  CT chest abdomen pelvis dated 03/31/2018 FINDINGS: Mediastinal blood pool activity: SUV max 2.3 NECK: No hypermetabolic cervical lymphadenopathy. Incidental CT findings: none CHEST: 5.3 x 3.6 cm spiculated mass in the superior segment right lower lobe (series 8/image 29), max SUV 10.4. Associated satellite nodularity in the right lower lobe measuring up to 8 mm, suspicious for metastases. 7 mm short axis low right paratracheal node (series 4/image 31), max SUV 2.6, likely reactive. Additional mild hypermetabolism in the right hilar region, max SUV 2.1, likely reactive. Incidental CT findings: Atherosclerotic calcifications of the aortic arch. ABDOMEN/PELVIS: No abnormal hypermetabolism in the liver, spleen, pancreas, or adrenal glands. No hypermetabolic abdominopelvic lymphadenopathy. Incidental CT findings: Atherosclerotic calcifications the abdominal aorta and branch vessels. SKELETON: No focal hypermetabolic activity to suggest skeletal metastasis. Incidental CT findings: Degenerative changes of the visualized thoracolumbar spine. IMPRESSION: 5.3 cm spiculated right lower lobe mass, compatible with primary bronchogenic neoplasm. Associated satellite nodularity in the right lower lobe measuring up to 8 mm, suspicious for metastases. Electronically Signed   By: Julian Hy M.D.   On: 04/14/2018 10:07

## 2018-04-23 NOTE — Progress Notes (Signed)
@Patient  ID: Travis [name], male    DOB: 12-06-1955, 63 y.o.   MRN: [id number]   HPI: 63 year old male former smoker (quit March 31, 2018) seen for pulmonary consult April 09, 2018 for Right sided lung mass required EBUS that confirmed Lung Cancer -poorly differentiated lung adenocarcinoma with features of focal squamous differentiation Medical history significant for diabetes and hypertension  TEST/EVENTS :  CT chest March 31, 2018 4.2 cm marginated mass within the superior segment of the right lower lobe appearing to extend across the fissure into the posterior inferior right upper lobe and extending towards the right hilum where the right upper lobe superior segment bronchus is occluded, small noncalcified nodules right lung concerning for lung metastasis  PET scan April 14, 2018 5.3 cm spiculated hypermetabolic right lower lobe mass, associated satellite nodularity in the right lower lobe measuring up to 8 mm suspicious for mets.  Mild hypermetabolic activity right hilar region No hypermetabolic abdominal lymphadenopathy or skeletal involvement   MRI brain April 18, 2018-negative for brain mets  PFT April 22, 2018 FEV1 70%, ratio 76, FVC 72%, no significant bronchodilator response, DLCO 29%  04/23/18 Follow up: Lung Cancer  Patient returns for a follow-up visit.  He was recently seen earlier this month for pulmonary consult for right lower lobe lung mass.  A CT chest showed a 4.2 marginated mass within the right lower lobe.  Underwent a PET scan on January 21 that showed a 5.3 cm spiculated hypermetabolic right lower lobe mass associated satellite nodularity in the right lower lobe.  Patient underwent a bronchoscopy/EBUS that was positive for poorly differentiated lung adenocarcinoma with features of focal squamous differentiation.  Patient has been set up for oncology referral tomorrow.  He underwent an MRI brain on January 25 was negative for brain mets.  Patient has had 10 to  15 pound weight loss over the last 6 months.  Energy level has been down for the last 6 months.  Patient does work part-time.  Troubled by low back pain for last year. He denies any hemoptysis or chest pain. Was able to do normal activities 1 year ago but weaker for last several months  Since bronchoscopy he denies any increased shortness of breath or cough. Patient quit smoking earlier this month.  Pulmonary function test showed moderate restriction with no airflow obstruction.  FEV1 was 70%, ratio 76, FVC 72%.  There was no significant bronchodilator response.  Patient had a severe diffusing defect DLCO at 29%.  O2 saturation 99% on room air in the office.  Patient has no shortness of breath at rest.  Does get winded with heavy activity. We reviewed his test results.  Patient support provided.    No Known Allergies   There is no immunization history on file for this patient.  Past Medical History:  Diagnosis Date  . Arthritis   . Cancer ([place])    lung  . Chronic kidney disease   . Diabetes mellitus    TYPE II  . Dyspnea    at times  . Hypertension   . Pneumonia    "years ago"    Tobacco History: Social History   Tobacco Use  Smoking Status Former Smoker  . Packs/day: 1.00  . Years: 45.00  . Pack years: 45.00  . Types: Cigarettes  . Last attempt to quit: 03/31/2018  . Years since quitting: 0.0  Smokeless Tobacco Former Systems developer  . Types: Chew  . Quit date: 03/25/1973   Counseling given: Not Answered  Outpatient Medications Prior to Visit  Medication Sig Dispense Refill  . amitriptyline (ELAVIL) 25 MG tablet Take 50 mg by mouth at bedtime.    Marland Kitchen amLODipine (NORVASC) 5 MG tablet Take 5 mg by mouth daily.    . diclofenac (VOLTAREN) 75 MG EC tablet Take 75 mg by mouth 2 (two) times daily.    . metFORMIN (GLUCOPHAGE-XR) 500 MG 24 hr tablet Take 1,000 mg by mouth 2 (two) times daily.     No facility-administered medications prior to visit.      Review of Systems:    Constitutional:   No    night sweats,  Fevers, chills,  +fatigue, or  lassitude.  HEENT:   No headaches,  Difficulty swallowing,  Tooth/dental problems, or  Sore throat,                No sneezing, itching, ear ache, nasal congestion, post nasal drip,   CV:  No chest pain,  Orthopnea, PND, swelling in lower extremities, anasarca, dizziness, palpitations, syncope.   GI  No heartburn, indigestion, abdominal pain, nausea, vomiting, diarrhea, change in bowel habits, loss of appetite, bloody stools.   Resp: No excess mucus, no productive cough,  No non-productive cough,  No coughing up of blood.  No change in color of mucus.  No wheezing.  No chest wall deformity  Skin: no rash or lesions.  GU: no dysuria, change in color of urine, no urgency or frequency.  No flank pain, no hematuria   MS:  No joint pain or swelling.  No decreased range of motion. + back pain.    Physical Exam   GEN: A/Ox3; pleasant , NAD, thin    HEENT:  [place]/AT,  EACs-clear, TMs-wnl, NOSE-clear, THROAT-clear, no lesions, no postnasal drip or exudate noted. Poor dentition   NECK:  Supple w/ fair ROM; no JVD; normal carotid impulses w/o bruits; no thyromegaly or nodules palpated; no lymphadenopathy.    RESP  Clear  P & A; w/o, wheezes/ rales/ or rhonchi. no accessory muscle use, no dullness to percussion  CARD:  RRR, no m/r/g, no peripheral edema, pulses intact, no cyanosis or clubbing.  GI:   Soft & nt; nml bowel sounds; no organomegaly or masses detected.   Musco: Warm bil, no deformities or joint swelling noted.   Neuro: alert, no focal deficits noted.    Skin: Warm, no lesions or rashes    Lab Results:  CBC    Component Value Date/Time   WBC 8.3 04/20/2018 1141   RBC 4.06 (L) 04/20/2018 1141   HGB 12.4 (L) 04/20/2018 1141   HGB 13.1 04/09/2018 1201   HCT 38.8 (L) 04/20/2018 1141   PLT 254 04/20/2018 1141   PLT 267 04/09/2018 1201   MCV 95.6 04/20/2018 1141   MCH 30.5 04/20/2018 1141   MCHC  32.0 04/20/2018 1141   RDW 11.4 (L) 04/20/2018 1141   LYMPHSABS 2.2 04/09/2018 1201   MONOABS 0.5 04/09/2018 1201   EOSABS 0.3 04/09/2018 1201   BASOSABS 0.0 04/09/2018 1201    BMET    Component Value Date/Time   NA 142 04/20/2018 1141   K 4.4 04/20/2018 1141   CL 109 04/20/2018 1141   CO2 24 04/20/2018 1141   GLUCOSE 163 (H) 04/20/2018 1141   BUN 15 04/20/2018 1141   CREATININE 1.02 04/20/2018 1141   CREATININE 1.42 (H) 04/09/2018 1201   CALCIUM 9.7 04/20/2018 1141   GFRNONAA >60 04/20/2018 1141   GFRNONAA 53 (L) 04/09/2018 1201   GFRAA >  60 04/20/2018 1141   GFRAA >60 04/09/2018 1201    BNP No results found for: BNP  ProBNP No results found for: PROBNP  Imaging: Dg Chest 2 View  Result Date: 03/31/2018 CLINICAL DATA:  Cough and short of breath EXAM: CHEST - 2 VIEW COMPARISON:  04/20/2016 FINDINGS: Patchy 3 cm right perihilar airspace density is new. Left lung is clear. No effusion. Heart size and vascularity normal. IMPRESSION: New right perihilar density. Probable pneumonia however mass lesion is possible. Followup PA and lateral chest X-ray is recommended in 3-4 weeks following trial of antibiotic therapy to ensure resolution and exclude underlying malignancy. Electronically Signed   By: Franchot Gallo M.D.   On: 03/31/2018 13:01   Ct Chest W Contrast  Result Date: 03/31/2018 CLINICAL DATA:  Weight loss unintended, abdominal pain, anorexia, some dizziness and high blood pressure today, right shoulder pain EXAM: CT CHEST, ABDOMEN, AND PELVIS WITH CONTRAST TECHNIQUE: Multidetector CT imaging of the chest, abdomen and pelvis was performed following the standard protocol during bolus administration of intravenous contrast. CONTRAST:  142mL ISOVUE-300 IOPAMIDOL (ISOVUE-300) INJECTION 61% COMPARISON:  Chest x-ray of 03/31/2018 FINDINGS: CT CHEST FINDINGS Cardiovascular: Moderate thoracic aortic atherosclerosis is present. The heart is mildly enlarged. No significant pericardial  effusion is seen. There is good opacification of the pulmonary arteries and no evidence of embolism is noted. The thoracic aorta also is well opacified with no acute abnormality noted. The mid ascending thoracic aorta measures 33 mm in diameter. Mediastinum/Nodes: There may be right hilar node on image 36 series 2 measuring 10 mm in diameter. No other definite mediastinal or hilar adenopathy is seen with a small precarinal node of 7 mm. Only small mediastinal lymph nodes are present. The thyroid gland is unremarkable. No abnormality of the esophagus is seen by CT. Lungs/Pleura: On lung window images, there is and irregularly marginated solid-appearing mass within the right upper lobe extending toward the hilum. This mass appears to cross the major fissure into the right upper lobe posterior medially. The bronchus to the superior segment of the right lower lobe is occluded presumably by tumor. This lesion measures 4.2 x 3.3 cm with height of approximate 3.1 cm. In addition, there are multiple scattered lung nodules primarily throughout the right lung. The largest nodule is in the superior segment of the right lower lobe as well on image 97 series 6 measuring 9 mm in diameter. Additional nodules are scattered primarily throughout the right lung a between 3 and 7 mm. Two nodules are present within the left lung which appear pleural based of 3 mm in diameter on image 134 and 3 mm in diameter on image 143 both in the left lower lobe. A nodular opacity adjacent to the major fissure on the left on image 119 probably represents perifissural lymph node. Musculoskeletal: There are degenerative changes in the lower cervical, upper thoracic, and lower thoracic spine. No lytic or blastic bone lesion is seen. The sternum is unremarkable other than degenerative change. CT ABDOMEN PELVIS FINDINGS Hepatobiliary: The liver enhances with no focal abnormality noted. No ductal dilatation is seen. Gallbladder is somewhat contracted and  no calcified gallstones are seen. Pancreas: The pancreas is normal in size in the pancreatic duct is not dilated. Spleen: The spleen is somewhat inhomogeneous in enhancement, but no suspicious focal abnormality is noted. Adrenals/Urinary Tract: The adrenal glands appear normal. The kidneys enhance with no calculus or mass. No hydronephrosis is seen on delayed images. The pelvocaliceal systems are unremarkable. The ureters appear normal  in caliber. The urinary bladder is moderately urine distended with no abnormality noted. Stomach/Bowel: The stomach is distended with fluid and food debris. No abnormality is seen. No distention of small bowel is seen and no edema is noted. No abnormality of the colon is seen. The terminal ileum and the appendix are unremarkable. Vascular/Lymphatic: Moderate abdominal aortic atherosclerosis is noted. No adenopathy is seen. Reproductive: The prostate is normal in size. Other: No abdominal wall hernia is noted, other than a tiny umbilical hernia containing only fat. Musculoskeletal: The lumbar vertebrae are in normal alignment with mild degenerative change. There is degenerative change involve the facet joints of L4-5 and L5-S1. No lytic or blastic bony lesion is seen. The SI joints appear corticated IMPRESSION: 1. 4.2 x 3.3 x 3.1 cm irregularly marginated mass within the superior segment of the right lower lobe appearing to extend across the fissure into the posterior inferior right upper lobe and extending toward the right hilum where the right upper lobe superior segment bronchus is occluded, consistent with primary lung carcinoma. 2. Small noncalcified nodules more numerous throughout the right lung most consistent with lung metastases. 3. No metastatic involvement of the abdomen or pelvis by CT. 4. Moderate thoracic and abdominal aortic atherosclerosis. Electronically Signed   By: Ivar Drape M.D.   On: 03/31/2018 14:16   Mr Jeri Cos SH Contrast  Result Date: 04/18/2018 CLINICAL  DATA:  Lung mass.  Intracranial staging EXAM: MRI HEAD WITHOUT AND WITH CONTRAST TECHNIQUE: Multiplanar, multiecho pulse sequences of the brain and surrounding structures were obtained without and with intravenous contrast. CONTRAST:  7.2 cc Gadavist intravenous COMPARISON:  08/25/2017 head CT FINDINGS: Brain: No mass or swelling to suggest metastatic disease. No infarction, hemorrhage, hydrocephalus, extra-axial collection or mass lesion. Vascular: Normal flow voids. Skull and upper cervical spine: Normal marrow signal. Sinuses/Orbits: Negative IMPRESSION: Negative for brain metastasis. Electronically Signed   By: Monte Fantasia M.D.   On: 04/18/2018 13:50   Ct Abdomen Pelvis W Contrast  Result Date: 03/31/2018 CLINICAL DATA:  Weight loss unintended, abdominal pain, anorexia, some dizziness and high blood pressure today, right shoulder pain EXAM: CT CHEST, ABDOMEN, AND PELVIS WITH CONTRAST TECHNIQUE: Multidetector CT imaging of the chest, abdomen and pelvis was performed following the standard protocol during bolus administration of intravenous contrast. CONTRAST:  127mL ISOVUE-300 IOPAMIDOL (ISOVUE-300) INJECTION 61% COMPARISON:  Chest x-ray of 03/31/2018 FINDINGS: CT CHEST FINDINGS Cardiovascular: Moderate thoracic aortic atherosclerosis is present. The heart is mildly enlarged. No significant pericardial effusion is seen. There is good opacification of the pulmonary arteries and no evidence of embolism is noted. The thoracic aorta also is well opacified with no acute abnormality noted. The mid ascending thoracic aorta measures 33 mm in diameter. Mediastinum/Nodes: There may be right hilar node on image 36 series 2 measuring 10 mm in diameter. No other definite mediastinal or hilar adenopathy is seen with a small precarinal node of 7 mm. Only small mediastinal lymph nodes are present. The thyroid gland is unremarkable. No abnormality of the esophagus is seen by CT. Lungs/Pleura: On lung window images, there  is and irregularly marginated solid-appearing mass within the right upper lobe extending toward the hilum. This mass appears to cross the major fissure into the right upper lobe posterior medially. The bronchus to the superior segment of the right lower lobe is occluded presumably by tumor. This lesion measures 4.2 x 3.3 cm with height of approximate 3.1 cm. In addition, there are multiple scattered lung nodules primarily throughout the right  lung. The largest nodule is in the superior segment of the right lower lobe as well on image 97 series 6 measuring 9 mm in diameter. Additional nodules are scattered primarily throughout the right lung a between 3 and 7 mm. Two nodules are present within the left lung which appear pleural based of 3 mm in diameter on image 134 and 3 mm in diameter on image 143 both in the left lower lobe. A nodular opacity adjacent to the major fissure on the left on image 119 probably represents perifissural lymph node. Musculoskeletal: There are degenerative changes in the lower cervical, upper thoracic, and lower thoracic spine. No lytic or blastic bone lesion is seen. The sternum is unremarkable other than degenerative change. CT ABDOMEN PELVIS FINDINGS Hepatobiliary: The liver enhances with no focal abnormality noted. No ductal dilatation is seen. Gallbladder is somewhat contracted and no calcified gallstones are seen. Pancreas: The pancreas is normal in size in the pancreatic duct is not dilated. Spleen: The spleen is somewhat inhomogeneous in enhancement, but no suspicious focal abnormality is noted. Adrenals/Urinary Tract: The adrenal glands appear normal. The kidneys enhance with no calculus or mass. No hydronephrosis is seen on delayed images. The pelvocaliceal systems are unremarkable. The ureters appear normal in caliber. The urinary bladder is moderately urine distended with no abnormality noted. Stomach/Bowel: The stomach is distended with fluid and food debris. No abnormality is  seen. No distention of small bowel is seen and no edema is noted. No abnormality of the colon is seen. The terminal ileum and the appendix are unremarkable. Vascular/Lymphatic: Moderate abdominal aortic atherosclerosis is noted. No adenopathy is seen. Reproductive: The prostate is normal in size. Other: No abdominal wall hernia is noted, other than a tiny umbilical hernia containing only fat. Musculoskeletal: The lumbar vertebrae are in normal alignment with mild degenerative change. There is degenerative change involve the facet joints of L4-5 and L5-S1. No lytic or blastic bony lesion is seen. The SI joints appear corticated IMPRESSION: 1. 4.2 x 3.3 x 3.1 cm irregularly marginated mass within the superior segment of the right lower lobe appearing to extend across the fissure into the posterior inferior right upper lobe and extending toward the right hilum where the right upper lobe superior segment bronchus is occluded, consistent with primary lung carcinoma. 2. Small noncalcified nodules more numerous throughout the right lung most consistent with lung metastases. 3. No metastatic involvement of the abdomen or pelvis by CT. 4. Moderate thoracic and abdominal aortic atherosclerosis. Electronically Signed   By: Ivar Drape M.D.   On: 03/31/2018 14:16   Nm Pet Image Initial (pi) Skull Base To Thigh  Result Date: 04/14/2018 CLINICAL DATA:  Initial treatment strategy for right lung mass. EXAM: NUCLEAR MEDICINE PET SKULL BASE TO THIGH TECHNIQUE: 8.0 mCi F-18 FDG was injected intravenously. Full-ring PET imaging was performed from the skull base to thigh after the radiotracer. CT data was obtained and used for attenuation correction and anatomic localization. Fasting blood glucose: 133 mg/dl COMPARISON:  CT chest abdomen pelvis dated 03/31/2018 FINDINGS: Mediastinal blood pool activity: SUV max 2.3 NECK: No hypermetabolic cervical lymphadenopathy. Incidental CT findings: none CHEST: 5.3 x 3.6 cm spiculated mass in  the superior segment right lower lobe (series 8/image 29), max SUV 10.4. Associated satellite nodularity in the right lower lobe measuring up to 8 mm, suspicious for metastases. 7 mm short axis low right paratracheal node (series 4/image 31), max SUV 2.6, likely reactive. Additional mild hypermetabolism in the right hilar region, max SUV  2.1, likely reactive. Incidental CT findings: Atherosclerotic calcifications of the aortic arch. ABDOMEN/PELVIS: No abnormal hypermetabolism in the liver, spleen, pancreas, or adrenal glands. No hypermetabolic abdominopelvic lymphadenopathy. Incidental CT findings: Atherosclerotic calcifications the abdominal aorta and branch vessels. SKELETON: No focal hypermetabolic activity to suggest skeletal metastasis. Incidental CT findings: Degenerative changes of the visualized thoracolumbar spine. IMPRESSION: 5.3 cm spiculated right lower lobe mass, compatible with primary bronchogenic neoplasm. Associated satellite nodularity in the right lower lobe measuring up to 8 mm, suspicious for metastases. Electronically Signed   By: Julian Hy M.D.   On: 04/14/2018 10:07      No flowsheet data found.  No results found for: NITRICOXIDE      Assessment & Plan:   No problem-specific Assessment & Plan notes found for this encounter.     Rexene Edison, NP 04/23/2018

## 2018-04-24 ENCOUNTER — Inpatient Hospital Stay: Payer: BLUE CROSS/BLUE SHIELD

## 2018-04-24 ENCOUNTER — Encounter: Payer: Self-pay | Admitting: Hematology

## 2018-04-24 ENCOUNTER — Inpatient Hospital Stay (HOSPITAL_BASED_OUTPATIENT_CLINIC_OR_DEPARTMENT_OTHER): Payer: BLUE CROSS/BLUE SHIELD | Admitting: Hematology

## 2018-04-24 ENCOUNTER — Other Ambulatory Visit: Payer: BLUE CROSS/BLUE SHIELD

## 2018-04-24 ENCOUNTER — Encounter: Payer: Self-pay | Admitting: *Deleted

## 2018-04-24 ENCOUNTER — Ambulatory Visit: Payer: BLUE CROSS/BLUE SHIELD | Admitting: Hematology

## 2018-04-24 ENCOUNTER — Telehealth: Payer: Self-pay | Admitting: Hematology

## 2018-04-24 ENCOUNTER — Other Ambulatory Visit: Payer: Self-pay

## 2018-04-24 VITALS — BP 149/81 | HR 99 | Temp 97.4°F | Resp 16 | Ht 71.0 in | Wt 167.8 lb

## 2018-04-24 DIAGNOSIS — D649 Anemia, unspecified: Secondary | ICD-10-CM

## 2018-04-24 DIAGNOSIS — K429 Umbilical hernia without obstruction or gangrene: Secondary | ICD-10-CM

## 2018-04-24 DIAGNOSIS — E875 Hyperkalemia: Secondary | ICD-10-CM

## 2018-04-24 DIAGNOSIS — C3431 Malignant neoplasm of lower lobe, right bronchus or lung: Secondary | ICD-10-CM

## 2018-04-24 DIAGNOSIS — N179 Acute kidney failure, unspecified: Secondary | ICD-10-CM | POA: Diagnosis not present

## 2018-04-24 DIAGNOSIS — Z79899 Other long term (current) drug therapy: Secondary | ICD-10-CM

## 2018-04-24 DIAGNOSIS — J449 Chronic obstructive pulmonary disease, unspecified: Secondary | ICD-10-CM | POA: Insufficient documentation

## 2018-04-24 DIAGNOSIS — I10 Essential (primary) hypertension: Secondary | ICD-10-CM

## 2018-04-24 DIAGNOSIS — I700 Atherosclerosis of aorta: Secondary | ICD-10-CM

## 2018-04-24 DIAGNOSIS — Z7984 Long term (current) use of oral hypoglycemic drugs: Secondary | ICD-10-CM

## 2018-04-24 DIAGNOSIS — E119 Type 2 diabetes mellitus without complications: Secondary | ICD-10-CM

## 2018-04-24 DIAGNOSIS — F17210 Nicotine dependence, cigarettes, uncomplicated: Secondary | ICD-10-CM

## 2018-04-24 DIAGNOSIS — Z720 Tobacco use: Secondary | ICD-10-CM

## 2018-04-24 MED ORDER — LORAZEPAM 0.5 MG PO TABS: 0.5000 mg | ORAL_TABLET | Freq: Four times a day (QID) | ORAL | 0 refills | Status: DC | PRN

## 2018-04-24 MED ORDER — DEXAMETHASONE 4 MG PO TABS: 8.0000 mg | ORAL_TABLET | Freq: Every day | ORAL | 1 refills | Status: DC

## 2018-04-24 MED ORDER — LIDOCAINE-PRILOCAINE 2.5-2.5 % EX CREA: TOPICAL_CREAM | CUTANEOUS | 3 refills | Status: DC

## 2018-04-24 MED ORDER — PROCHLORPERAZINE MALEATE 10 MG PO TABS: 10.0000 mg | ORAL_TABLET | Freq: Four times a day (QID) | ORAL | 1 refills | Status: DC | PRN

## 2018-04-24 MED ORDER — ONDANSETRON HCL 8 MG PO TABS: 8.0000 mg | ORAL_TABLET | Freq: Two times a day (BID) | ORAL | 1 refills | Status: DC | PRN

## 2018-04-24 NOTE — Assessment & Plan Note (Signed)
Congratulated on smoking cessation.  

## 2018-04-24 NOTE — Assessment & Plan Note (Signed)
Poorly differentiated lung adenocarcinoma -right lung mass. Patient has oncology follow-up April 24, 2018.  Reviewed his test results.  Support was provided.  Plan  Patient Instructions  Follow up with Oncology tomorrow .  Follow up with Dr. Lake Bells or [place] NP in 2 months at [facility] .  Please contact office for sooner follow up if symptoms do not improve or worsen or seek emergency care

## 2018-04-24 NOTE — Assessment & Plan Note (Signed)
Status post E bus confirmed lung cancer with poorly differentiated lung adenocarcinoma with features of focal squamous differentiation.  Oncology referral is pending for 04/24/18

## 2018-04-24 NOTE — Progress Notes (Signed)
START ON PATHWAY REGIMEN - Non-Small Cell Lung     Administer weekly:     Paclitaxel      Carboplatin   **Always confirm dose/schedule in your pharmacy ordering system**  Patient Characteristics: Stage IIA/IIB - Unresectable AJCC T Category: T3 Current Disease Status: No Distant Mets or Local Recurrence AJCC N Category: N0 AJCC M Category: M0 AJCC 8 Stage Grouping: IIB Intent of Therapy: Curative Intent, Discussed with Patient

## 2018-04-24 NOTE — Telephone Encounter (Signed)
Appointments scheduled / ? Regarding Chemo Ed. I advised patient that I would be calling him next week with time for 05/07/18.  AVS/Calendar printed.  I also gave patient my name and number should he have any issues with his appointments per 1/31 los

## 2018-04-24 NOTE — Progress Notes (Signed)
Patient being seen to discuss initiation of treatment. He will be getting weekly chemo with concurrent radiation. He needs a referral to Radiation Oncology, which was scheduled when he was here, on Tuesday at 1030.   Patient will also have an order for port placement.   Message given to Chemo Educator about need for chemo education.  Patient is concerned about his job. He feels like he will lose his job once treatment starts because it's a small company with no FMLA protection. On Monday, I will reach out to see if I can locate financial help for this patient, including assistance with applying for disability. Patient knows I will follow up with him next week.

## 2018-04-24 NOTE — Assessment & Plan Note (Signed)
PFTs do not show any significant airflow obstruction.  There is some mild to moderate restriction with a severe diffusing defect.  Patient has minimum symptoms except for shortness of breath with heavy activity.  O2 saturations are adequate.  Will hold on inhaler therapy at this time. Continue to monitor.

## 2018-04-27 ENCOUNTER — Encounter: Payer: Self-pay | Admitting: *Deleted

## 2018-04-27 ENCOUNTER — Telehealth: Payer: Self-pay | Admitting: Hematology

## 2018-04-27 NOTE — Progress Notes (Signed)
Reached out to [employer], Education officer, museum, regarding patient's concerns and what actions could be taken proactively to address patient concerns about losing his job and insurance. She reviewed disability options with me and possible community resources for patient.   Patient has a scheduled appointment at the [facility] tomorrow. Travis [name] will reach out to the patient and see if she can make contact with him tomorrow and review all information directly with the patient.   Will be available for any needed follow up or coordination of care.

## 2018-04-27 NOTE — Progress Notes (Signed)
Reviewed, agree 

## 2018-04-27 NOTE — Telephone Encounter (Signed)
LMVM for patient regarding appointment for Chemo Ed on 05/05/18 @4 :00(appt says 3:00) this was ok'd per Hilario Quarry / per 1/31 los

## 2018-04-28 ENCOUNTER — Encounter: Payer: Self-pay | Admitting: *Deleted

## 2018-04-28 ENCOUNTER — Ambulatory Visit: Payer: BLUE CROSS/BLUE SHIELD

## 2018-04-28 ENCOUNTER — Ambulatory Visit
Admission: RE | Admit: 2018-04-28 | Discharge: 2018-04-28 | Disposition: A | Discharge status: Home / Self Care | Payer: BLUE CROSS/BLUE SHIELD | Source: Ambulatory Visit | Attending: Radiation Oncology | Admitting: Radiation Oncology

## 2018-04-28 ENCOUNTER — Inpatient Hospital Stay: Payer: BLUE CROSS/BLUE SHIELD

## 2018-04-28 DIAGNOSIS — C3431 Malignant neoplasm of lower lobe, right bronchus or lung: Secondary | ICD-10-CM | POA: Insufficient documentation

## 2018-04-28 DIAGNOSIS — Z791 Long term (current) use of non-steroidal anti-inflammatories (NSAID): Secondary | ICD-10-CM

## 2018-04-28 DIAGNOSIS — G629 Polyneuropathy, unspecified: Secondary | ICD-10-CM

## 2018-04-28 DIAGNOSIS — Z87891 Personal history of nicotine dependence: Secondary | ICD-10-CM | POA: Insufficient documentation

## 2018-04-28 DIAGNOSIS — Z7984 Long term (current) use of oral hypoglycemic drugs: Secondary | ICD-10-CM

## 2018-04-28 DIAGNOSIS — Z5111 Encounter for antineoplastic chemotherapy: Secondary | ICD-10-CM | POA: Insufficient documentation

## 2018-04-28 DIAGNOSIS — D638 Anemia in other chronic diseases classified elsewhere: Secondary | ICD-10-CM

## 2018-04-28 DIAGNOSIS — Z79899 Other long term (current) drug therapy: Secondary | ICD-10-CM

## 2018-04-28 DIAGNOSIS — E1140 Type 2 diabetes mellitus with diabetic neuropathy, unspecified: Secondary | ICD-10-CM | POA: Insufficient documentation

## 2018-04-29 ENCOUNTER — Encounter: Payer: Self-pay | Admitting: *Deleted

## 2018-04-29 ENCOUNTER — Encounter: Payer: Self-pay | Admitting: Hematology

## 2018-04-29 NOTE — Progress Notes (Signed)
Thoracic Location of Tumor / Histology: Malignant neoplasm of lower lobe of right lung  Patient presented with BP issues, right shoulder pain, shortness of breath.   Port insertion 05/05/2018  MRI Brain 04/18/2018: negative for mets  PET 04/14/2018: RLL mass 5.3 x 3.6 cm and a satellite nodule in RLL measuring up to 8 mm suspicious for mets;  Mild hypermetabolic activity right hilar region, no hypermetabolic abdominal lymphadenopathy or skeletal involvement.  CT CAP 03/31/2018: 4.2 x 3.3 x 3.1 cm RLL mass extending to posterior inferior RUL and extending towards the right hilum where the RUL superior segment bronchus is occluded.  Additional small noncalcified nodules throughout the right lung, concerning for lung metastases; no metastatic disease in the abdomen/pelvis.   EBUS with biopsy: FDG avid RLL mass showed poorly differentiated adenoCa with features of coal squamous cell differentiation; 4R LN negative for malignancy.  Biopsies of RLL 04/20/2018   Tobacco/Marijuana/Snuff/ETOH use: Current smoker, process of quitting.  Past/Anticipated interventions by cardiothoracic surgery, if any:   Past/Anticipated interventions by medical oncology, if any:  Dr. Maylon Peppers 04/24/2018 - I discussed the case at length with Dr. Roxan Hockey of thoracic surgery; due to his poor PFT's, he is not a candidate for surgery, as the large RLL mass would require bilobectomy. -I will refer the patient to radiation oncology for concurrent chemoradiation. -We discussed the role of chemotherapy, the goal of treatment is curative intent. -We discussed some of the risks, benefits, side-effects of carboplatin and paclitaxel.  The treatment is weekly x 7 weeks. -I have ordered port for chemotherapy administration. -Chemo education class on 05/07/2018, chemo start 2/14.  Signs/Symptoms  Weight changes, if any: No  Respiratory complaints, if any: SOB with increased activities.  Hemoptysis, if any: No  Pain issues, if  any:  Right side pain 5/10  BP (!) 130/91 (BP Location: Right Arm, Patient Position: Sitting)   Pulse (!) 117   Temp 97.6 F (36.4 C) (Oral)   Resp 20   Ht 5\' 11"  (1.803 m)   Wt 162 lb 9.6 oz (73.8 kg)   SpO2 99%   BMI 22.68 kg/m    Wt Readings from Last 3 Encounters:  04/30/18 162 lb 9.6 oz (73.8 kg)  04/24/18 167 lb 12.8 oz (76.1 kg)  04/23/18 165 lb (74.8 kg)    SAFETY ISSUES:  Prior radiation? No  Pacemaker/ICD? No  Possible current pregnancy? No  Is the patient on methotrexate? No  Current Complaints / other details:

## 2018-04-29 NOTE — Progress Notes (Signed)
[place] Work  Holiday representative received referral from Therapist, sports for financial concerns and questions regarding disability.  CSW contacted patient at home to offer support and assess for needs.  Patient is currently working and in concerned he will not be able to continue working through treatment; which will result in lose of insurance coverage.  Patient was agreeable to meet with a member of the CSW team after his appointment tomorrow 2/6.  Possible servant center referral.    Johnnye Lana, MSW, LCSW, OSW-C Clinical Social Worker [facility] [phone]

## 2018-04-30 ENCOUNTER — Encounter: Payer: Self-pay | Admitting: General Practice

## 2018-04-30 ENCOUNTER — Ambulatory Visit
Admission: RE | Admit: 2018-04-30 | Discharge: 2018-04-30 | Disposition: A | Discharge status: Home / Self Care | Payer: BLUE CROSS/BLUE SHIELD | Source: Ambulatory Visit | Attending: Radiation Oncology | Admitting: Radiation Oncology

## 2018-04-30 ENCOUNTER — Institutional Professional Consult (permissible substitution): Payer: 59 | Admitting: Pulmonary Disease

## 2018-04-30 ENCOUNTER — Ambulatory Visit: Payer: BLUE CROSS/BLUE SHIELD | Admitting: Radiation Oncology

## 2018-04-30 ENCOUNTER — Inpatient Hospital Stay: Payer: BLUE CROSS/BLUE SHIELD | Admitting: General Practice

## 2018-04-30 ENCOUNTER — Other Ambulatory Visit: Payer: Self-pay

## 2018-04-30 ENCOUNTER — Encounter: Payer: Self-pay | Admitting: Radiation Oncology

## 2018-04-30 VITALS — BP 130/91 | HR 117 | Temp 97.6°F | Resp 20 | Ht 71.0 in | Wt 162.6 lb

## 2018-04-30 DIAGNOSIS — E1122 Type 2 diabetes mellitus with diabetic chronic kidney disease: Secondary | ICD-10-CM | POA: Insufficient documentation

## 2018-04-30 DIAGNOSIS — M1990 Unspecified osteoarthritis, unspecified site: Secondary | ICD-10-CM | POA: Insufficient documentation

## 2018-04-30 DIAGNOSIS — C3491 Malignant neoplasm of unspecified part of right bronchus or lung: Secondary | ICD-10-CM

## 2018-04-30 DIAGNOSIS — F17210 Nicotine dependence, cigarettes, uncomplicated: Secondary | ICD-10-CM | POA: Insufficient documentation

## 2018-04-30 DIAGNOSIS — Z7984 Long term (current) use of oral hypoglycemic drugs: Secondary | ICD-10-CM | POA: Insufficient documentation

## 2018-04-30 DIAGNOSIS — Z79899 Other long term (current) drug therapy: Secondary | ICD-10-CM | POA: Insufficient documentation

## 2018-04-30 DIAGNOSIS — I129 Hypertensive chronic kidney disease with stage 1 through stage 4 chronic kidney disease, or unspecified chronic kidney disease: Secondary | ICD-10-CM | POA: Insufficient documentation

## 2018-04-30 DIAGNOSIS — C3431 Malignant neoplasm of lower lobe, right bronchus or lung: Secondary | ICD-10-CM | POA: Insufficient documentation

## 2018-04-30 DIAGNOSIS — C801 Malignant (primary) neoplasm, unspecified: Secondary | ICD-10-CM

## 2018-04-30 DIAGNOSIS — C7801 Secondary malignant neoplasm of right lung: Secondary | ICD-10-CM | POA: Insufficient documentation

## 2018-04-30 DIAGNOSIS — N189 Chronic kidney disease, unspecified: Secondary | ICD-10-CM | POA: Insufficient documentation

## 2018-04-30 NOTE — Progress Notes (Signed)
[place] CSW Progress Notes  Referral from medical oncologist for help w financial concerns, including disability application.  Met w patient and fiancee in [place] office.  Patient is a truck driver for a small firm, no disability of FMLA coverage.  Will be out of work for duration of chemotherapy and radiation treatment for lung cancer.  Referred to [facility] for help w filing disability application - advised that this is a lengthy process and final decision is up to [facility] reviewers.  Referred to [place] for gas card program - is eligible for $50 gas card each quarter if program has funds available.  Application faxed, card will be mailed to patient home address.  Enrolled in [place], provided first $50 card to be used for food and/or gas.  Patient can return for up to $200 total in funds.  Messaged [employer] in Radiation to ask them to reach out to patient for any [facility] grant possibilities for financial help.  Asked patient to contact CSW when [facility] appts are set so a follow up appointment can be set.  Patient agreeable to all the above.  Edwyna Shell, LCSW Clinical Social Worker Phone:  [phone]

## 2018-04-30 NOTE — Progress Notes (Signed)
[place] Psychosocial Distress Screening Clinical Social Work  Clinical Social Work was referred by distress screening protocol.  The patient scored a 8 on the Psychosocial Distress Thermometer which indicates moderate distress. Clinical Social Worker met w patient in [place] office to assess for distress and other psychosocial needs. Met w patient and fiancee, requested help w applications for financial help and referral for disability application w [facility].  Will follow up at [facility] appointments.    ONCBCN DISTRESS SCREENING 04/30/2018  Screening Type Initial Screening  Distress experienced in past week (1-10) 8  Practical problem type Housing  Emotional problem type Adjusting to illness  Spiritual/Religous concerns type Relating to God  Physical Problem type Sleep/insomnia;Breathing;Talking;Tingling hands/feet;Sexual problems;Skin dry/itchy   Clinical Social Worker follow up needed: Yes.    If yes, follow up plan:  PAtient will contact CSW when [facility] appts are scheduled in order to schedule further appointments, has my card for questions in the meantime.   Beverely Pace, [place], LCSW Clinical Social Worker Phone:  [phone]

## 2018-04-30 NOTE — Progress Notes (Signed)
Radiation Oncology         [phone] ________________________________  Name: Ihan Pat        MRN: [id number]  Date of Service: 04/30/2018 DOB: 06-17-1955  NG:[id number], No Pcp Per  Tish Men, MD     REFERRING PHYSICIAN: Tish Men, MD   DIAGNOSIS: 63 year old male with stage IIb, cT3 N0 M0, NSCLC, adenocarcinoma of the right lower lobe lung.   HISTORY OF PRESENT ILLNESS: Rimas Gilham is a 63 y.o. male seen at the request of Dr. Maylon Peppers.  He presented to the emergency department on 03/31/2018 with complaints of lightheadedness/dizziness, shortness of breath, cough, decreased appetite and unintentional weight loss.  A chest x-ray was performed at that time and revealed a right middle lobe infiltrate versus mass.  This was further evaluated with a CT C/A/P which demonstrated a solid-appearing right lower lobe mass measuring 4.2 x 3.3 cm and extending across the fissure into the right upper lobe and towards the right hilum with occlusion of the right upper lobe superior segment bronchus.  There were no concerning findings in the abdomen or pelvis.  A PET scan performed on 04/14/2018 confirmed a 5.3 cm spiculated right lower lobe mass with SUV max of 10.4 as well as a suspicious 8 mm satellite lesion in the right lower lobe.  An MRI of the brain on 04/18/2018 for further disease staging was negative for any evidence of metastatic disease.  He underwent a bronchoscopy with EBUS for tissue sampling of the RLL mass and 4R node with Dr. Lake Bells on 04/20/2018.  Final pathology confirmed a poorly differentiated adenocarcinoma with features of focal squamous differentiation without nodal involvement.  He is not deemed to be a good surgical candidate due to his poor pulmonary function testing and large RLL mass that would require bilobectomy.  Therefore, he has reviewed his pathology and findings with Dr. Maylon Peppers who has recommended proceeding with concurrent chemoradiation and is planning to start chemotherapy  on 05/08/18.  He has been referred today to discuss the potential option for radiotherapy, concurrent with chemotherapy, for management of his disease.   PREVIOUS RADIATION THERAPY: No   PAST MEDICAL HISTORY:  Past Medical History:  Diagnosis Date  . Arthritis   . Cancer ([place])    lung  . Chronic kidney disease   . Diabetes mellitus    TYPE II  . Dyspnea    at times  . Hypertension   . Pneumonia    "years ago"       PAST SURGICAL HISTORY: Past Surgical History:  Procedure Laterality Date  . COLONOSCOPY W/ POLYPECTOMY    . NASAL FRACTURE SURGERY    . VIDEO BRONCHOSCOPY WITH ENDOBRONCHIAL ULTRASOUND Right 04/20/2018   Procedure: VIDEO BRONCHOSCOPY WITH ENDOBRONCHIAL ULTRASOUND;  Surgeon: Juanito Doom, MD;  Location: [facility] OR;  Service: Thoracic;  Laterality: Right;     FAMILY HISTORY:  Family History  Problem Relation Age of Onset  . Leukemia Sister   . Cancer Brother      SOCIAL HISTORY:  reports that he has been smoking cigarettes. He has a 45.00 pack-year smoking history. He quit smokeless tobacco use about 45 years ago.  His smokeless tobacco use included chew. He reports current alcohol use of about 2.0 standard drinks of alcohol per week. He reports that he does not use drugs.   ALLERGIES: Patient has no known allergies.   MEDICATIONS:  Current Outpatient Medications  Medication Sig Dispense Refill  . amitriptyline (ELAVIL) 25 MG tablet Take 50 mg  by mouth at bedtime.    Marland Kitchen amLODipine (NORVASC) 5 MG tablet Take 5 mg by mouth daily.    Marland Kitchen dexamethasone (DECADRON) 4 MG tablet Take 2 tablets (8 mg total) by mouth daily. Start the day after chemotherapy for 2 days. 30 tablet 1  . diclofenac (VOLTAREN) 75 MG EC tablet Take 75 mg by mouth 2 (two) times daily.    Marland Kitchen lidocaine-prilocaine (EMLA) cream Apply to affected area once 30 g 3  . LORazepam (ATIVAN) 0.5 MG tablet Take 1 tablet (0.5 mg total) by mouth every 6 (six) hours as needed (Nausea or vomiting). 30  tablet 0  . metFORMIN (GLUCOPHAGE-XR) 500 MG 24 hr tablet Take 1,000 mg by mouth 2 (two) times daily.    . ondansetron (ZOFRAN) 8 MG tablet Take 1 tablet (8 mg total) by mouth 2 (two) times daily as needed for refractory nausea / vomiting. Start on day 3 after chemo. 30 tablet 1  . prochlorperazine (COMPAZINE) 10 MG tablet Take 1 tablet (10 mg total) by mouth every 6 (six) hours as needed (Nausea or vomiting). 30 tablet 1   No current facility-administered medications for this encounter.      REVIEW OF SYSTEMS: On review of systems, the patient reports that He is doing well overall. He denies any chest pain, shortness of breath, cough, fevers, chills, night sweats, unintended weight changes. He denies any bowel or bladder disturbances, and denies abdominal pain, nausea or vomiting. He denies any new musculoskeletal or joint aches or pains. A complete review of systems is obtained and is otherwise negative.     PHYSICAL EXAM:  Wt Readings from Last 3 Encounters:  04/30/18 162 lb 9.6 oz (73.8 kg)  04/24/18 167 lb 12.8 oz (76.1 kg)  04/23/18 165 lb (74.8 kg)   Temp Readings from Last 3 Encounters:  04/30/18 97.6 F (36.4 C) (Oral)  04/24/18 (!) 97.4 F (36.3 C) (Oral)  04/20/18 98.1 F (36.7 C)   BP Readings from Last 3 Encounters:  04/30/18 (!) 130/91  04/24/18 (!) 149/81  04/23/18 138/68   Pulse Readings from Last 3 Encounters:  04/30/18 (!) 117  04/24/18 99  04/23/18 (!) 111   Pain Assessment Pain Score: 5  Pain Frequency: Intermittent Pain Loc: (Right side)/10  In general this is a well appearing male in no acute distress. He is alert and oriented x4 and appropriate throughout the examination. HEENT reveals that the patient is normocephalic, atraumatic. EOMs are intact. PERRLA. Skin is intact without any evidence of gross lesions. Cardiovascular exam reveals a regular rate and rhythm, no clicks rubs or murmurs are auscultated. Chest is clear to auscultation bilaterally.  Lymphatic assessment is performed and does not reveal any adenopathy in the cervical, supraclavicular, axillary, or inguinal chains. Abdomen has active bowel sounds in all quadrants and is intact. The abdomen is soft, non tender, non distended. Lower extremities are negative for pretibial pitting edema, deep calf tenderness, cyanosis or clubbing.   ECOG = 1  0 - Asymptomatic (Fully active, able to carry on all predisease activities without restriction)  1 - Symptomatic but completely ambulatory (Restricted in physically strenuous activity but ambulatory and able to carry out work of a light or sedentary nature. For example, light housework, office work)  2 - Symptomatic, <50% in bed during the day (Ambulatory and capable of all self care but unable to carry out any work activities. Up and about more than 50% of waking hours)  3 - Symptomatic, >50% in bed, but  not bedbound (Capable of only limited self-care, confined to bed or chair 50% or more of waking hours)  4 - Bedbound (Completely disabled. Cannot carry on any self-care. Totally confined to bed or chair)  5 - Death   Eustace Pen MM, Creech RH, Tormey DC, et al. [phone]). "Toxicity and response criteria of the [facility]". [place] Oncol. 5 (6): 649-55    LABORATORY DATA:  Lab Results  Component Value Date   WBC 8.3 04/20/2018   HGB 12.4 (L) 04/20/2018   HCT 38.8 (L) 04/20/2018   MCV 95.6 04/20/2018   PLT 254 04/20/2018   Lab Results  Component Value Date   NA 142 04/20/2018   K 4.4 04/20/2018   CL 109 04/20/2018   CO2 24 04/20/2018   Lab Results  Component Value Date   ALT 32 04/20/2018   AST 18 04/20/2018   ALKPHOS 66 04/20/2018   BILITOT 0.8 04/20/2018      RADIOGRAPHY: Mr Jeri Cos XL Contrast  Result Date: 04/18/2018 CLINICAL DATA:  Lung mass.  Intracranial staging EXAM: MRI HEAD WITHOUT AND WITH CONTRAST TECHNIQUE: Multiplanar, multiecho pulse sequences of the brain and surrounding structures  were obtained without and with intravenous contrast. CONTRAST:  7.2 cc Gadavist intravenous COMPARISON:  08/25/2017 head CT FINDINGS: Brain: No mass or swelling to suggest metastatic disease. No infarction, hemorrhage, hydrocephalus, extra-axial collection or mass lesion. Vascular: Normal flow voids. Skull and upper cervical spine: Normal marrow signal. Sinuses/Orbits: Negative IMPRESSION: Negative for brain metastasis. Electronically Signed   By: Monte Fantasia M.D.   On: 04/18/2018 13:50   Nm Pet Image Initial (pi) Skull Base To Thigh  Result Date: 04/14/2018 CLINICAL DATA:  Initial treatment strategy for right lung mass. EXAM: NUCLEAR MEDICINE PET SKULL BASE TO THIGH TECHNIQUE: 8.0 mCi F-18 FDG was injected intravenously. Full-ring PET imaging was performed from the skull base to thigh after the radiotracer. CT data was obtained and used for attenuation correction and anatomic localization. Fasting blood glucose: 133 mg/dl COMPARISON:  CT chest abdomen pelvis dated 03/31/2018 FINDINGS: Mediastinal blood pool activity: SUV max 2.3 NECK: No hypermetabolic cervical lymphadenopathy. Incidental CT findings: none CHEST: 5.3 x 3.6 cm spiculated mass in the superior segment right lower lobe (series 8/image 29), max SUV 10.4. Associated satellite nodularity in the right lower lobe measuring up to 8 mm, suspicious for metastases. 7 mm short axis low right paratracheal node (series 4/image 31), max SUV 2.6, likely reactive. Additional mild hypermetabolism in the right hilar region, max SUV 2.1, likely reactive. Incidental CT findings: Atherosclerotic calcifications of the aortic arch. ABDOMEN/PELVIS: No abnormal hypermetabolism in the liver, spleen, pancreas, or adrenal glands. No hypermetabolic abdominopelvic lymphadenopathy. Incidental CT findings: Atherosclerotic calcifications the abdominal aorta and branch vessels. SKELETON: No focal hypermetabolic activity to suggest skeletal metastasis. Incidental CT findings:  Degenerative changes of the visualized thoracolumbar spine. IMPRESSION: 5.3 cm spiculated right lower lobe mass, compatible with primary bronchogenic neoplasm. Associated satellite nodularity in the right lower lobe measuring up to 8 mm, suspicious for metastases. Electronically Signed   By: Julian Hy M.D.   On: 04/14/2018 10:07       IMPRESSION/PLAN: 31. 63 year old male with stage IIb, cT3 N0 M0, NSCLC, adenocarcinoma of the right lower lobe lung. Today, I talked to the patient and family about the findings and workup thus far. We discussed the natural history of NSCLC and general treatment, highlighting the role of radiotherapy in the management. We discussed the available radiation techniques, and focused  on the details of logistics and delivery.  The recommendation is to proceed with a 6 1/2-week course of radiotherapy to the right lower lobe, concurrent with chemotherapy.  We reviewed the anticipated acute and late sequelae associated with radiation in this setting. The patient was encouraged to ask questions that were answered to his satisfaction.  He will proceed with simulation in the near future for treatment planning  ------------------------------------------------  Jodelle Gross, MD, PhD

## 2018-05-04 ENCOUNTER — Other Ambulatory Visit: Payer: Self-pay | Admitting: Radiology

## 2018-05-05 ENCOUNTER — Other Ambulatory Visit: Payer: Self-pay

## 2018-05-05 ENCOUNTER — Ambulatory Visit (HOSPITAL_COMMUNITY)
Admission: RE | Admit: 2018-05-05 | Discharge: 2018-05-05 | Disposition: A | Discharge status: Home / Self Care | Payer: BLUE CROSS/BLUE SHIELD | Source: Ambulatory Visit | Attending: Hematology | Admitting: Hematology

## 2018-05-05 ENCOUNTER — Other Ambulatory Visit: Payer: BLUE CROSS/BLUE SHIELD

## 2018-05-05 ENCOUNTER — Ambulatory Visit
Admission: RE | Admit: 2018-05-05 | Discharge: 2018-05-05 | Disposition: A | Discharge status: Home / Self Care | Payer: BLUE CROSS/BLUE SHIELD | Source: Ambulatory Visit | Attending: Radiation Oncology | Admitting: Radiation Oncology

## 2018-05-05 ENCOUNTER — Encounter (HOSPITAL_COMMUNITY): Payer: Self-pay

## 2018-05-05 DIAGNOSIS — M1990 Unspecified osteoarthritis, unspecified site: Secondary | ICD-10-CM | POA: Insufficient documentation

## 2018-05-05 DIAGNOSIS — I129 Hypertensive chronic kidney disease with stage 1 through stage 4 chronic kidney disease, or unspecified chronic kidney disease: Secondary | ICD-10-CM | POA: Diagnosis not present

## 2018-05-05 DIAGNOSIS — C801 Malignant (primary) neoplasm, unspecified: Secondary | ICD-10-CM | POA: Insufficient documentation

## 2018-05-05 DIAGNOSIS — N189 Chronic kidney disease, unspecified: Secondary | ICD-10-CM | POA: Insufficient documentation

## 2018-05-05 DIAGNOSIS — E1122 Type 2 diabetes mellitus with diabetic chronic kidney disease: Secondary | ICD-10-CM | POA: Diagnosis not present

## 2018-05-05 DIAGNOSIS — C3431 Malignant neoplasm of lower lobe, right bronchus or lung: Secondary | ICD-10-CM | POA: Diagnosis not present

## 2018-05-05 DIAGNOSIS — Z809 Family history of malignant neoplasm, unspecified: Secondary | ICD-10-CM | POA: Insufficient documentation

## 2018-05-05 DIAGNOSIS — Z806 Family history of leukemia: Secondary | ICD-10-CM | POA: Insufficient documentation

## 2018-05-05 DIAGNOSIS — Z7984 Long term (current) use of oral hypoglycemic drugs: Secondary | ICD-10-CM | POA: Insufficient documentation

## 2018-05-05 DIAGNOSIS — Z79899 Other long term (current) drug therapy: Secondary | ICD-10-CM | POA: Diagnosis not present

## 2018-05-05 DIAGNOSIS — Z87891 Personal history of nicotine dependence: Secondary | ICD-10-CM | POA: Diagnosis not present

## 2018-05-05 DIAGNOSIS — Z510 Encounter for antineoplastic radiation therapy: Secondary | ICD-10-CM | POA: Diagnosis not present

## 2018-05-05 DIAGNOSIS — C7801 Secondary malignant neoplasm of right lung: Secondary | ICD-10-CM | POA: Diagnosis not present

## 2018-05-05 HISTORY — PX: IR IMAGING GUIDED PORT INSERTION: IMG5740

## 2018-05-05 LAB — CBC WITH DIFFERENTIAL/PLATELET
Abs Immature Granulocytes: 0.03 10*3/uL (ref 0.00–0.07)
Basophils Absolute: 0.1 10*3/uL (ref 0.0–0.1)
Basophils Relative: 1 %
Eosinophils Absolute: 0.2 10*3/uL (ref 0.0–0.5)
Eosinophils Relative: 3 %
HCT: 36.8 % — ABNORMAL LOW (ref 39.0–52.0)
Hemoglobin: 11.9 g/dL — ABNORMAL LOW (ref 13.0–17.0)
Immature Granulocytes: 1 %
Lymphocytes Relative: 32 %
Lymphs Abs: 2 10*3/uL (ref 0.7–4.0)
MCH: 30.6 pg (ref 26.0–34.0)
MCHC: 32.3 g/dL (ref 30.0–36.0)
MCV: 94.6 fL (ref 80.0–100.0)
Monocytes Absolute: 0.6 10*3/uL (ref 0.1–1.0)
Monocytes Relative: 9 %
Neutro Abs: 3.5 10*3/uL (ref 1.7–7.7)
Neutrophils Relative %: 54 %
Platelets: 249 10*3/uL (ref 150–400)
RBC: 3.89 MIL/uL — ABNORMAL LOW (ref 4.22–5.81)
RDW: 11.8 % (ref 11.5–15.5)
WBC: 6.3 10*3/uL (ref 4.0–10.5)
nRBC: 0 % (ref 0.0–0.2)

## 2018-05-05 LAB — PROTIME-INR
INR: 0.76
Prothrombin Time: 10.5 seconds — ABNORMAL LOW (ref 11.4–15.2)

## 2018-05-05 LAB — GLUCOSE, CAPILLARY: Glucose-Capillary: 249 mg/dL — ABNORMAL HIGH (ref 70–99)

## 2018-05-05 MED ORDER — HEPARIN SOD (PORK) LOCK FLUSH 100 UNIT/ML IV SOLN
INTRAVENOUS | Status: AC | PRN
  Administered 2018-05-05: 500 [IU] via INTRAVENOUS

## 2018-05-05 MED ORDER — CEFAZOLIN SODIUM-DEXTROSE 2-4 GM/100ML-% IV SOLN
INTRAVENOUS | Status: AC
  Administered 2018-05-05: 2 g via INTRAVENOUS
  Filled 2018-05-05: qty 100

## 2018-05-05 MED ORDER — MIDAZOLAM HCL 2 MG/2ML IJ SOLN
INTRAMUSCULAR | Status: AC | PRN
  Administered 2018-05-05 (×2): 1 mg via INTRAVENOUS

## 2018-05-05 MED ORDER — LIDOCAINE HCL (PF) 1 % IJ SOLN
INTRAMUSCULAR | Status: AC | PRN
  Administered 2018-05-05: 10 mL

## 2018-05-05 MED ORDER — MIDAZOLAM HCL 2 MG/2ML IJ SOLN
INTRAMUSCULAR | Status: AC
  Filled 2018-05-05: qty 4

## 2018-05-05 MED ORDER — FENTANYL CITRATE (PF) 100 MCG/2ML IJ SOLN
INTRAMUSCULAR | Status: AC | PRN
  Administered 2018-05-05 (×2): 50 ug via INTRAVENOUS

## 2018-05-05 MED ORDER — LIDOCAINE HCL 1 % IJ SOLN
INTRAMUSCULAR | Status: AC
  Filled 2018-05-05: qty 20

## 2018-05-05 MED ORDER — CEFAZOLIN SODIUM-DEXTROSE 2-4 GM/100ML-% IV SOLN
2.0000 g | INTRAVENOUS | Status: AC
  Administered 2018-05-05: 2 g via INTRAVENOUS

## 2018-05-05 MED ORDER — SODIUM CHLORIDE 0.9 % IV SOLN
INTRAVENOUS | Status: DC
  Administered 2018-05-05: 14:00:00 via INTRAVENOUS

## 2018-05-05 MED ORDER — FENTANYL CITRATE (PF) 100 MCG/2ML IJ SOLN
INTRAMUSCULAR | Status: AC
  Filled 2018-05-05: qty 2

## 2018-05-05 MED ORDER — HEPARIN SOD (PORK) LOCK FLUSH 100 UNIT/ML IV SOLN
INTRAVENOUS | Status: AC
  Filled 2018-05-05: qty 5

## 2018-05-05 MED ORDER — LIDOCAINE HCL (PF) 1 % IJ SOLN
INTRAMUSCULAR | Status: AC | PRN
  Administered 2018-05-05: 5 mL

## 2018-05-05 NOTE — Procedures (Signed)
Interventional Radiology Procedure Note  Procedure: Single Lumen Power Port Placement    Access:  Left IJ vein.  Findings: Catheter tip positioned at SVC/RA junction. Port is ready for immediate use.   Complications: None  EBL: < 10 mL  Recommendations:  - Ok to shower in 24 hours - Do not submerge for 7 days - Routine line care   Lachlan Mckim T. Kathlene Cote, M.D Pager:  [phone]

## 2018-05-05 NOTE — Consult Note (Signed)
Chief Complaint: Patient was seen in consultation today for port a cath placement  Referring Physician(s): [place]  Supervising Physician: Aletta Edouard  Patient Status: [facility] - Out-pt  History of Present Illness: Travis [name] is a 63 y.o. male, ex-smoker, with history of right lung adenocarcinoma who presents today for Port-A-Cath placement for planned chemotherapy. He is also scheduled to receive radiation therapy.  Past Medical History:  Diagnosis Date  . Arthritis   . Cancer ([place])    lung  . Chronic kidney disease   . Diabetes mellitus    TYPE II  . Dyspnea    at times  . Hypertension   . Pneumonia    "years ago"    Past Surgical History:  Procedure Laterality Date  . COLONOSCOPY W/ POLYPECTOMY    . NASAL FRACTURE SURGERY    . VIDEO BRONCHOSCOPY WITH ENDOBRONCHIAL ULTRASOUND Right 04/20/2018   Procedure: VIDEO BRONCHOSCOPY WITH ENDOBRONCHIAL ULTRASOUND;  Surgeon: Juanito Doom, MD;  Location: [place];  Service: Thoracic;  Laterality: Right;    Allergies: Patient has no known allergies.  Medications: Prior to Admission medications   Medication Sig Start Date End Date Taking? Authorizing Provider  amitriptyline (ELAVIL) 25 MG tablet Take 50 mg by mouth at bedtime. 04/03/18   [provider]  amLODipine (NORVASC) 5 MG tablet Take 5 mg by mouth daily. 04/10/18   [provider]  dexamethasone (DECADRON) 4 MG tablet Take 2 tablets (8 mg total) by mouth daily. Start the day after chemotherapy for 2 days. 04/24/18   Tish Men, MD  diclofenac (VOLTAREN) 75 MG EC tablet Take 75 mg by mouth 2 (two) times daily. 04/03/18   [provider]  lidocaine-prilocaine (EMLA) cream Apply to affected area once 04/24/18   Tish Men, MD  LORazepam (ATIVAN) 0.5 MG tablet Take 1 tablet (0.5 mg total) by mouth every 6 (six) hours as needed (Nausea or vomiting). 04/24/18   Tish Men, MD  metFORMIN (GLUCOPHAGE-XR) 500 MG 24 hr tablet Take 1,000 mg by mouth 2  (two) times daily.    [provider]  ondansetron (ZOFRAN) 8 MG tablet Take 1 tablet (8 mg total) by mouth 2 (two) times daily as needed for refractory nausea / vomiting. Start on day 3 after chemo. 04/24/18   Tish Men, MD  prochlorperazine (COMPAZINE) 10 MG tablet Take 1 tablet (10 mg total) by mouth every 6 (six) hours as needed (Nausea or vomiting). 04/24/18   Tish Men, MD     Family History  Problem Relation Age of Onset  . Leukemia Sister   . Cancer Brother     Social History   Socioeconomic History  . Marital status: Single    Spouse name: Not on file  . Number of children: Not on file  . Years of education: Not on file  . Highest education level: Not on file  Occupational History  . Not on file  Social Needs  . Financial resource strain: Not on file  . Food insecurity:    Worry: Not on file    Inability: Not on file  . Transportation needs:    Medical: No    Non-medical: No  Tobacco Use  . Smoking status: Current Every Day Smoker    Packs/day: 1.00    Years: 45.00    Pack years: 45.00    Types: Cigarettes    Last attempt to quit: 03/31/2018    Years since quitting: 0.0  . Smokeless tobacco: Former Systems developer    Types:  Sarina Ser    Quit date: 03/25/1973  Substance and Sexual Activity  . Alcohol use: Yes    Alcohol/week: 2.0 standard drinks    Types: 2 Shots of liquor per week  . Drug use: No  . Sexual activity: Not on file  Lifestyle  . Physical activity:    Days per week: Not on file    Minutes per session: Not on file  . Stress: Not on file  Relationships  . Social connections:    Talks on phone: Not on file    Gets together: Not on file    Attends religious service: Not on file    Active member of club or organization: Not on file    Attends meetings of clubs or organizations: Not on file    Relationship status: Not on file  Other Topics Concern  . Not on file  Social History Narrative  . Not on file      Review of Systems denies fever, headache,  cough, abdominal pain, nausea, vomiting or bleeding.  He does have occasional chest discomfort, dyspnea, and low back pain.  Vital Signs: BP 138/89   Pulse (!) 111   Temp 97.9 F (36.6 C) (Oral)   Resp 18   SpO2 100%   Physical Exam awake, sl drowsy.  Chest with distant breath sounds bilaterally.  Heart with slightly tachycardic but regular rhythm.  Abdomen soft, positive bowel sounds, nontender.  No lower extremity edema.  Imaging: Mr Jeri Cos ZO Contrast  Result Date: 04/18/2018 CLINICAL DATA:  Lung mass.  Intracranial staging EXAM: MRI HEAD WITHOUT AND WITH CONTRAST TECHNIQUE: Multiplanar, multiecho pulse sequences of the brain and surrounding structures were obtained without and with intravenous contrast. CONTRAST:  7.2 cc Gadavist intravenous COMPARISON:  08/25/2017 head CT FINDINGS: Brain: No mass or swelling to suggest metastatic disease. No infarction, hemorrhage, hydrocephalus, extra-axial collection or mass lesion. Vascular: Normal flow voids. Skull and upper cervical spine: Normal marrow signal. Sinuses/Orbits: Negative IMPRESSION: Negative for brain metastasis. Electronically Signed   By: Monte Fantasia M.D.   On: 04/18/2018 13:50   Nm Pet Image Initial (pi) Skull Base To Thigh  Result Date: 04/14/2018 CLINICAL DATA:  Initial treatment strategy for right lung mass. EXAM: NUCLEAR MEDICINE PET SKULL BASE TO THIGH TECHNIQUE: 8.0 mCi F-18 FDG was injected intravenously. Full-ring PET imaging was performed from the skull base to thigh after the radiotracer. CT data was obtained and used for attenuation correction and anatomic localization. Fasting blood glucose: 133 mg/dl COMPARISON:  CT chest abdomen pelvis dated 03/31/2018 FINDINGS: Mediastinal blood pool activity: SUV max 2.3 NECK: No hypermetabolic cervical lymphadenopathy. Incidental CT findings: none CHEST: 5.3 x 3.6 cm spiculated mass in the superior segment right lower lobe (series 8/image 29), max SUV 10.4. Associated satellite  nodularity in the right lower lobe measuring up to 8 mm, suspicious for metastases. 7 mm short axis low right paratracheal node (series 4/image 31), max SUV 2.6, likely reactive. Additional mild hypermetabolism in the right hilar region, max SUV 2.1, likely reactive. Incidental CT findings: Atherosclerotic calcifications of the aortic arch. ABDOMEN/PELVIS: No abnormal hypermetabolism in the liver, spleen, pancreas, or adrenal glands. No hypermetabolic abdominopelvic lymphadenopathy. Incidental CT findings: Atherosclerotic calcifications the abdominal aorta and branch vessels. SKELETON: No focal hypermetabolic activity to suggest skeletal metastasis. Incidental CT findings: Degenerative changes of the visualized thoracolumbar spine. IMPRESSION: 5.3 cm spiculated right lower lobe mass, compatible with primary bronchogenic neoplasm. Associated satellite nodularity in the right lower lobe measuring up to 8 mm,  suspicious for metastases. Electronically Signed   By: Julian Hy M.D.   On: 04/14/2018 10:07    Labs:  CBC: Recent Labs    03/31/18 1142 04/09/18 1201 04/20/18 1141  WBC 6.7 6.7 8.3  HGB 12.2* 13.1 12.4*  HCT 37.5* 40.9 38.8*  PLT 204 267 254    COAGS: Recent Labs    04/20/18 1141  INR 0.91  APTT 28    BMP: Recent Labs    03/31/18 1142 04/09/18 1201 04/20/18 1141  NA 138 145 142  K 3.5 5.2* 4.4  CL 106 107 109  CO2 24 29 24   GLUCOSE 250* 185* 163*  BUN 10 24* 15  CALCIUM 9.2 10.2 9.7  CREATININE 0.92 1.42* 1.02  GFRNONAA >60 53* >60  GFRAA >60 >60 >60    LIVER FUNCTION TESTS: Recent Labs    03/31/18 1142 04/09/18 1201 04/20/18 1141  BILITOT 0.7 0.7 0.8  AST 16 9* 18  ALT 13 11 32  ALKPHOS 48 63 66  PROT 6.0* 6.9 6.9  ALBUMIN 3.6 4.4 4.0    TUMOR MARKERS: No results for input(s): AFPTM, CEA, CA199, CHROMGRNA in the last 8760 hours.  Assessment and Plan: 63 y.o. male, ex-smoker, with history of right lung adenocarcinoma who presents today for  Port-A-Cath placement for planned chemotherapy. He is also scheduled to receive radiation therapy.Risks and benefits of image guided port-a-catheter placement was discussed with the patient including, but not limited to bleeding, infection, pneumothorax, or fibrin sheath development and need for additional procedures.  All of the patient's questions were answered, patient is agreeable to proceed. Consent signed and in chart.  LABS PENDING    Thank you for this interesting consult.  I greatly enjoyed meeting Takahiro Godinho and look forward to participating in their care.  A copy of this report was sent to the requesting provider on this date.  Electronically Signed: D. Rowe Robert, PA-C 05/05/2018, 1:26 PM   I spent a total of 25 minutes  in face to face in clinical consultation, greater than 50% of which was counseling/coordinating care for Port-A-Cath placement

## 2018-05-05 NOTE — Discharge Instructions (Signed)
Moderate Conscious Sedation, Adult, Care After These instructions provide you with information about caring for yourself after your procedure. Your health care provider may also give you more specific instructions. Your treatment has been planned according to current medical practices, but problems sometimes occur. Call your health care provider if you have any problems or questions after your procedure. What can I expect after the procedure? After your procedure, it is common:  To feel sleepy for several hours.  To feel clumsy and have poor balance for several hours.  To have poor judgment for several hours.  To vomit if you eat too soon. Follow these instructions at home: For at least 24 hours after the procedure:   Do not: ? Participate in activities where you could fall or become injured. ? Drive. ? Use heavy machinery. ? Drink alcohol. ? Take sleeping pills or medicines that cause drowsiness. ? Make important decisions or sign legal documents. ? Take care of children on your own.  Rest. Eating and drinking  Follow the diet recommended by your health care provider.  If you vomit: ? Drink water, juice, or soup when you can drink without vomiting. ? Make sure you have little or no nausea before eating solid foods. General instructions  Have a responsible adult stay with you until you are awake and alert.  Take over-the-counter and prescription medicines only as told by your health care provider.  If you smoke, do not smoke without supervision.  Keep all follow-up visits as told by your health care provider. This is important. Contact a health care provider if:  You keep feeling nauseous or you keep vomiting.  You feel light-headed.  You develop a rash.  You have a fever. Get help right away if:  You have trouble breathing. This information is not intended to replace advice given to you by your health care provider. Make sure you discuss any questions you have  with your health care provider. Document Released: 12/30/2012 Document Revised: 08/14/2015 Document Reviewed: 07/01/2015 Elsevier Interactive Patient Education  2019 [place].   DO NOT use EMLA cream for 2 weeks after port placement as this cream will remove surgical glue on your incision.  Implanted [facility] Guide An implanted port is a device that is placed under the skin. It is usually placed in the chest. The device can be used to give IV medicine, to take blood, or for dialysis. You may have an implanted port if:  You need IV medicine that would be irritating to the small veins in your hands or arms.  You need IV medicines, such as antibiotics, for a long period of time.  You need IV nutrition for a long period of time.  You need dialysis. Having a port means that your health care provider will not need to use the veins in your arms for these procedures. You may have fewer limitations when using a port than you would if you used other types of long-term IVs, and you will likely be able to return to normal activities after your incision heals. An implanted port has two main parts:  Reservoir. The reservoir is the part where a needle is inserted to give medicines or draw blood. The reservoir is round. After it is placed, it appears as a small, raised area under your skin.  Catheter. The catheter is a thin, flexible tube that connects the reservoir to a vein. Medicine that is inserted into the reservoir goes into the catheter and then into the vein.  How is my port accessed? To access your port:  A numbing cream may be placed on the skin over the port site.  Your health care provider will put on a mask and sterile gloves.  The skin over your port will be cleaned carefully with a germ-killing soap and allowed to dry.  Your health care provider will gently pinch the port and insert a needle into it.  Your health care provider will check for a blood return to make sure the port  is in the vein and is not clogged.  If your port needs to remain accessed to get medicine continuously (constant infusion), your health care provider will place a clear bandage (dressing) over the needle site. The dressing and needle will need to be changed every week, or as told by your health care provider. What is flushing? Flushing helps keep the port from getting clogged. Follow instructions from your health care provider about how and when to flush the port. Ports are usually flushed with saline solution or a medicine called heparin. The need for flushing will depend on how the port is used:  If the port is only used from time to time to give medicines or draw blood, the port may need to be flushed: ? Before and after medicines have been given. ? Before and after blood has been drawn. ? As part of routine maintenance. Flushing may be recommended every 4-6 weeks.  If a constant infusion is running, the port may not need to be flushed.  Throw away any syringes in a disposal container that is meant for sharp items (sharps container). You can buy a sharps container from a pharmacy, or you can make one by using an empty hard plastic bottle with a cover. How long will my port stay implanted? The port can stay in for as long as your health care provider thinks it is needed. When it is time for the port to come out, a surgery will be done to remove it. The surgery will be similar to the procedure that was done to put the port in. Follow these instructions at home:   Flush your port as told by your health care provider.  If you need an infusion over several days, follow instructions from your health care provider about how to take care of your port site. Make sure you: ? Wash your hands with soap and water before you change your dressing. If soap and water are not available, use alcohol-based hand sanitizer. ? Change your dressing as told by your health care provider. ? Place any used dressings  or infusion bags into a plastic bag. Throw that bag in the trash. ? Keep the dressing that covers the needle clean and dry. Do not get it wet. ? Do not use scissors or sharp objects near the tube. ? Keep the tube clamped, unless it is being used.  Check your port site every day for signs of infection. Check for: ? Redness, swelling, or pain. ? Fluid or blood. ? Pus or a bad smell.  Protect the skin around the port site. ? Avoid wearing bra straps that rub or irritate the site. ? Protect the skin around your port from seat belts. Place a soft pad over your chest if needed.  Bathe or shower as told by your health care provider. The site may get wet as long as you are not actively receiving an infusion.  Return to your normal activities as told by your health care provider.  Ask your health care provider what activities are safe for you.  Carry a medical alert card or wear a medical alert bracelet at all times. This will let health care providers know that you have an implanted port in case of an emergency. Get help right away if:  You have redness, swelling, or pain at the port site.  You have fluid or blood coming from your port site.  You have pus or a bad smell coming from the port site.  You have a fever. Summary  Implanted ports are usually placed in the chest for long-term IV access.  Follow instructions from your health care provider about flushing the port and changing bandages (dressings).  Take care of the area around your port by avoiding clothing that puts pressure on the area, and by watching for signs of infection.  Protect the skin around your port from seat belts. Place a soft pad over your chest if needed.  Get help right away if you have a fever or you have redness, swelling, pain, drainage, or a bad smell at the port site. This information is not intended to replace advice given to you by your health care provider. Make sure you discuss any questions you have  with your health care provider. Document Released: 03/11/2005 Document Revised: 04/13/2016 Document Reviewed: 04/13/2016 Elsevier Interactive Patient Education  2019 [place] Insertion, Care After This sheet gives you information about how to care for yourself after your procedure. Your health care provider may also give you more specific instructions. If you have problems or questions, contact your health care provider. What can I expect after the procedure? After the procedure, it is common to have:  Discomfort at the port insertion site.  Bruising on the skin over the port. This should improve over 3-4 days. Follow these instructions at home: [facility] care  After your port is placed, you will get a manufacturer's information card. The card has information about your port. Keep this card with you at all times.  Take care of the port as told by your health care provider. Ask your health care provider if you or a family member can get training for taking care of the port at home. A home health care nurse may also take care of the port.  Make sure to remember what type of port you have. Incision care      Follow instructions from your health care provider about how to take care of your port insertion site. Make sure you: ? Wash your hands with soap and water before and after you change your bandage (dressing). If soap and water are not available, use hand sanitizer. ? Change your dressing as told by your health care provider. ? Leave stitches (sutures), skin glue, or adhesive strips in place. These skin closures may need to stay in place for 2 weeks or longer. If adhesive strip edges start to loosen and curl up, you may trim the loose edges. Do not remove adhesive strips completely unless your health care provider tells you to do that.  Check your port insertion site every day for signs of infection. Check for: ? Redness, swelling, or pain. ? Fluid or  blood. ? Warmth. ? Pus or a bad smell. Activity  Return to your normal activities as told by your health care provider. Ask your health care provider what activities are safe for you.  Do not lift anything that is heavier than 10 lb (4.5 kg), or the  limit that you are told, until your health care provider says that it is safe. General instructions  Take over-the-counter and prescription medicines only as told by your health care provider.  Do not take baths, swim, or use a hot tub until your health care provider approves. Ask your health care provider if you may take showers. You may only be allowed to take sponge baths.  Do not drive for 24 hours if you were given a sedative during your procedure.  Wear a medical alert bracelet in case of an emergency. This will tell any health care providers that you have a port.  Keep all follow-up visits as told by your health care provider. This is important. Contact a health care provider if:  You cannot flush your port with saline as directed, or you cannot draw blood from the port.  You have a fever or chills.  You have redness, swelling, or pain around your port insertion site.  You have fluid or blood coming from your port insertion site.  Your port insertion site feels warm to the touch.  You have pus or a bad smell coming from the port insertion site. Get help right away if:  You have chest pain or shortness of breath.  You have bleeding from your port that you cannot control. Summary  Take care of the port as told by your health care provider. Keep the manufacturer's information card with you at all times.  Change your dressing as told by your health care provider.  Contact a health care provider if you have a fever or chills or if you have redness, swelling, or pain around your port insertion site.  Keep all follow-up visits as told by your health care provider. This information is not intended to replace advice given to  you by your health care provider. Make sure you discuss any questions you have with your health care provider. Document Released: 12/30/2012 Document Revised: 10/07/2017 Document Reviewed: 10/07/2017 Elsevier Interactive Patient Education  [employer].

## 2018-05-07 NOTE — Progress Notes (Signed)
T[name] OFFICE PROGRESS NOTE  Patient Care Team: Patient, No Pcp Per as PCP - General (General Practice)  HEME/ONC OVERVIEW: 1. Stage IIB (cT3N0M0) adenocarcinoma of the right lung  -03/2018: CT CAP showed a 4.2 x 3.3 x 3.1cm RLL mass extending to RUL, additional small noncalcified nodules throughout the right lung, concerning for lung metastases; no metastatic disease in the abdomen/pelvis   -PET showed FDG-avid RLL mass 5.3 x 3.6cm and a satellite nodule in RLL; borderline FDG-avid (SUV 2.6) paratracheal LN   -MRI brain negative for mets   -EBUS w/ bx of the RLL mass showed poorly differentiated adenoCa with features of coal squamous cell differentiation; 4R LN negative for malignancy -04/2018 - present: definitive chemoradiation with weekly carbo/Taxol  2. Port placed in 04/2018  TREATMENT REGIMEN:  05/08/2018 - present: weekly carbo/Taxol concurrent with radiation; plan for 7 cycles   PERTINENT NON-HEM/ONC PROBLEMS: 1. Hx of tobacco use, quit in early 03/2018  ASSESSMENT & PLAN:   Stage IIB (cT3N0M0) adenocarcinoma of the right lung  -Patient completed chemotherapy education; I reinforced some of the chemotherapy benefits and potential toxicities  -Labs approriate to proceed with Cycle 1 of carbo/Taxol today -I will plan to see the patient every other cycle of chemotherapy to monitor for toxicities  -PRN medications: Zofran, Compazine, Ativan   Normocytic anemia -Secondary to anemia of chronic disease  -Hgb 11.7, stable -Patient denies any symptoms of bleeding -We will monitor it for now   Lower extremity neuropathy -Patient reports chronic, intermittent numbness in both feet, usually at the end of the day after working -I counseled the patient on the importance of monitoring for worsening neuropathy, and to inform us as soon as possible so that we can adjust treatment as needed   Post-tussive abdominal pain -Patient reports one episode of upper  abdominal pain after having a cough spell; new -Pain resolved without intervention -He has PRN tramadol at home; we will monitor it for now  Hx of tobacco use -I spent some time and counseled the patient on the importance of tobacco abstinence, especially during chemoradiation  All questions were answered. The patient knows to call the clinic with any problems, questions or concerns. No barriers to learning was detected.  Return in one week for labs, port flush, chemo infusion and clinic appt (okay to see in infusion).  Tish Men, MD 05/08/2018 12:08 PM  CHIEF COMPLAINT: "I am doing okay"  INTERVAL HISTORY: Travis [name] presents to clinic for Cycle 1 of carbo/Taxol concurrently with radiation for lung cancer.  Patient reports that he had one episode of upper abdominal pain after having a coughing spell.  The pain resolved without requiring any pain medication.  He has tramadol at home, but has not had to take it yet.  He is still trying to work as much as he can.  He reports that he has chronic, intermittent numbness in bilateral feet, usually at the end of the day, but he denies any constant numbness, tingling, or burning sensation in the hands or feet.  He denies any other complaint today.  SUMMARY OF ONCOLOGIC HISTORY:   Lung cancer ([place])   03/31/2018 Imaging    CT CAP with contrast: IMPRESSION: 1. 4.2 x 3.3 x 3.1 cm irregularly marginated mass within the superior segment of the right lower lobe appearing to extend across the fissure into the posterior inferior right upper lobe and extending toward the right hilum where the right upper lobe superior segment bronchus is occluded,  consistent with primary lung carcinoma. 2. Small noncalcified nodules more numerous throughout the right lung most consistent with lung metastases. 3. No metastatic involvement of the abdomen or pelvis by CT. 4. Moderate thoracic and abdominal aortic atherosclerosis.    04/03/2018 Initial Diagnosis    Lung  cancer ([place])    05/08/2018 -  Chemotherapy    The patient had palonosetron (ALOXI) injection 0.25 mg, 0.25 mg, Intravenous,  Once, 0 of 7 cycles CARBOplatin (PARAPLATIN) 220 mg in sodium chloride 0.9 % 100 mL chemo infusion, 220 mg (100 % of original dose 215.8 mg), Intravenous,  Once, 0 of 7 cycles Dose modification:   (original dose 215.8 mg, Cycle 1) PACLitaxel (TAXOL) 90 mg in sodium chloride 0.9 % 250 mL chemo infusion (</= 80mg /m2), 45 mg/m2 = 90 mg, Intravenous,  Once, 0 of 7 cycles  for chemotherapy treatment.      REVIEW OF SYSTEMS:   Constitutional: ( - ) fevers, ( - )  chills , ( - ) night sweats Eyes: ( - ) blurriness of vision, ( - ) double vision, ( - ) watery eyes Ears, nose, mouth, throat, and face: ( - ) mucositis, ( - ) sore throat Respiratory: ( + ) cough, ( - ) dyspnea, ( - ) wheezes Cardiovascular: ( - ) palpitation, ( - ) chest discomfort, ( - ) lower extremity swelling Gastrointestinal:  ( - ) nausea, ( - ) heartburn, ( - ) change in bowel habits Skin: ( - ) abnormal skin rashes Lymphatics: ( - ) new lymphadenopathy, ( - ) easy bruising Neurological: ( + ) numbness, ( - ) tingling, ( - ) new weaknesses Behavioral/Psych: ( - ) mood change, ( - ) new changes  All other systems were reviewed with the patient and are negative.  I have reviewed the past medical history, past surgical history, social history and family history with the patient and they are unchanged from previous note.  ALLERGIES:  has No Known Allergies.  MEDICATIONS:  Current Outpatient Medications  Medication Sig Dispense Refill  . mometasone (ELOCON) 0.1 % cream Apply thin layer to affected areas bid prn    . amitriptyline (ELAVIL) 25 MG tablet Take 50 mg by mouth at bedtime.    Marland Kitchen amLODipine (NORVASC) 5 MG tablet Take 5 mg by mouth daily.    Marland Kitchen dexamethasone (DECADRON) 4 MG tablet Take 2 tablets (8 mg total) by mouth daily. Start the day after chemotherapy for 2 days. 30 tablet 1  . diclofenac  (VOLTAREN) 75 MG EC tablet Take 75 mg by mouth 2 (two) times daily.    Marland Kitchen lidocaine-prilocaine (EMLA) cream Apply to affected area once 30 g 3  . LORazepam (ATIVAN) 0.5 MG tablet Take 1 tablet (0.5 mg total) by mouth every 6 (six) hours as needed (Nausea or vomiting). 30 tablet 0  . metFORMIN (GLUCOPHAGE-XR) 500 MG 24 hr tablet Take 1,000 mg by mouth 2 (two) times daily.    . ondansetron (ZOFRAN) 8 MG tablet Take 1 tablet (8 mg total) by mouth 2 (two) times daily as needed for refractory nausea / vomiting. Start on day 3 after chemo. 30 tablet 1  . prochlorperazine (COMPAZINE) 10 MG tablet Take 1 tablet (10 mg total) by mouth every 6 (six) hours as needed (Nausea or vomiting). 30 tablet 1   No current facility-administered medications for this visit.     PHYSICAL EXAMINATION: ECOG PERFORMANCE STATUS: 0 - Asymptomatic  Wt Readings from Last 3 Encounters:  04/30/18 162 lb  9.6 oz (73.8 kg)  04/24/18 167 lb 12.8 oz (76.1 kg)  04/23/18 165 lb (74.8 kg)   Temp Readings from Last 3 Encounters:  05/05/18 (!) 97.5 F (36.4 C) (Axillary)  04/30/18 97.6 F (36.4 C) (Oral)  04/24/18 (!) 97.4 F (36.3 C) (Oral)   BP Readings from Last 3 Encounters:  05/05/18 139/89  04/30/18 (!) 130/91  04/24/18 (!) 149/81   Pulse Readings from Last 3 Encounters:  05/05/18 (!) 104  04/30/18 (!) 117  04/24/18 99   GENERAL: alert, no distress and comfortable, thin SKIN: skin color, texture, turgor are normal, no rashes or significant lesions EYES: conjunctiva are pink and non-injected, sclera clear OROPHARYNX: no exudate, no erythema; lips, buccal mucosa, and tongue normal  NECK: supple, non-tender LYMPH:  no palpable lymphadenopathy in the cervical  LUNGS: clear to auscultation and percussion with normal breathing effort HEART: regular rate & rhythm and no murmurs and no lower extremity edema ABDOMEN: soft, non-tender, non-distended, normal bowel sounds Musculoskeletal: no cyanosis of digits and no  clubbing  PSYCH: alert & oriented x 3, fluent speech NEURO: no focal motor/sensory deficits  LABORATORY DATA:  I have reviewed the data as listed    Component Value Date/Time   NA 138 05/08/2018 1120   K 4.2 05/08/2018 1120   CL 103 05/08/2018 1120   CO2 27 05/08/2018 1120   GLUCOSE 285 (H) 05/08/2018 1120   BUN 17 05/08/2018 1120   CREATININE 1.13 05/08/2018 1120   CALCIUM 9.8 05/08/2018 1120   PROT 6.4 (L) 05/08/2018 1120   ALBUMIN 4.3 05/08/2018 1120   AST 15 05/08/2018 1120   ALT 36 05/08/2018 1120   ALKPHOS 95 05/08/2018 1120   BILITOT 0.3 05/08/2018 1120   GFRNONAA >60 05/08/2018 1120   GFRAA >60 05/08/2018 1120    No results found for: SPEP, UPEP  Lab Results  Component Value Date   WBC 6.4 05/08/2018   NEUTROABS 3.7 05/08/2018   HGB 11.7 (L) 05/08/2018   HCT 35.0 (L) 05/08/2018   MCV 92.8 05/08/2018   PLT 232 05/08/2018      Chemistry      Component Value Date/Time   NA 138 05/08/2018 1120   K 4.2 05/08/2018 1120   CL 103 05/08/2018 1120   CO2 27 05/08/2018 1120   BUN 17 05/08/2018 1120   CREATININE 1.13 05/08/2018 1120      Component Value Date/Time   CALCIUM 9.8 05/08/2018 1120   ALKPHOS 95 05/08/2018 1120   AST 15 05/08/2018 1120   ALT 36 05/08/2018 1120   BILITOT 0.3 05/08/2018 1120       RADIOGRAPHIC STUDIES: I have personally reviewed the radiological images as listed below and agreed with the findings in the report. Mr Jeri Cos Wo Contrast  Result Date: 04/18/2018 CLINICAL DATA:  Lung mass.  Intracranial staging EXAM: MRI HEAD WITHOUT AND WITH CONTRAST TECHNIQUE: Multiplanar, multiecho pulse sequences of the brain and surrounding structures were obtained without and with intravenous contrast. CONTRAST:  7.2 cc Gadavist intravenous COMPARISON:  08/25/2017 head CT FINDINGS: Brain: No mass or swelling to suggest metastatic disease. No infarction, hemorrhage, hydrocephalus, extra-axial collection or mass lesion. Vascular: Normal flow voids.  Skull and upper cervical spine: Normal marrow signal. Sinuses/Orbits: Negative IMPRESSION: Negative for brain metastasis. Electronically Signed   By: Monte Fantasia M.D.   On: 04/18/2018 13:50   Nm Pet Image Initial (pi) Skull Base To Thigh  Result Date: 04/14/2018 CLINICAL DATA:  Initial treatment strategy for right lung  mass. EXAM: NUCLEAR MEDICINE PET SKULL BASE TO THIGH TECHNIQUE: 8.0 mCi F-18 FDG was injected intravenously. Full-ring PET imaging was performed from the skull base to thigh after the radiotracer. CT data was obtained and used for attenuation correction and anatomic localization. Fasting blood glucose: 133 mg/dl COMPARISON:  CT chest abdomen pelvis dated 03/31/2018 FINDINGS: Mediastinal blood pool activity: SUV max 2.3 NECK: No hypermetabolic cervical lymphadenopathy. Incidental CT findings: none CHEST: 5.3 x 3.6 cm spiculated mass in the superior segment right lower lobe (series 8/image 29), max SUV 10.4. Associated satellite nodularity in the right lower lobe measuring up to 8 mm, suspicious for metastases. 7 mm short axis low right paratracheal node (series 4/image 31), max SUV 2.6, likely reactive. Additional mild hypermetabolism in the right hilar region, max SUV 2.1, likely reactive. Incidental CT findings: Atherosclerotic calcifications of the aortic arch. ABDOMEN/PELVIS: No abnormal hypermetabolism in the liver, spleen, pancreas, or adrenal glands. No hypermetabolic abdominopelvic lymphadenopathy. Incidental CT findings: Atherosclerotic calcifications the abdominal aorta and branch vessels. SKELETON: No focal hypermetabolic activity to suggest skeletal metastasis. Incidental CT findings: Degenerative changes of the visualized thoracolumbar spine. IMPRESSION: 5.3 cm spiculated right lower lobe mass, compatible with primary bronchogenic neoplasm. Associated satellite nodularity in the right lower lobe measuring up to 8 mm, suspicious for metastases. Electronically Signed   By: Julian Hy M.D.   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 EXAM: IMPLANTED PORT A CATH PLACEMENT WITH ULTRASOUND AND FLUOROSCOPIC GUIDANCE ANESTHESIA/SEDATION: 2.0 mg IV Versed; 100 mcg IV Fentanyl Total Moderate Sedation Time:  39 minutes The patient's level of consciousness and physiologic status were continuously monitored during the procedure by Radiology nursing. Additional Medications: 2 g IV Ancef. FLUOROSCOPY TIME:  30 seconds.  4.7 mGy. PROCEDURE: The procedure, risks, benefits, and alternatives were explained to the patient. Questions regarding the procedure were encouraged and answered. The patient understands and consents to the procedure. A time-out was performed prior to initiating the procedure. Ultrasound was utilized to confirm patency of the left internal jugular vein. The left neck and chest were prepped with chlorhexidine in a sterile fashion, and a sterile drape was applied covering the operative field. Maximum barrier sterile technique with sterile gowns and gloves were used for the procedure. Local anesthesia was provided with 1% lidocaine. After creating a small venotomy incision, a 21 gauge needle was advanced into the left internal jugular vein under direct, real-time ultrasound guidance. Ultrasound image documentation was performed. After securing guidewire access, an 8 Fr dilator was placed. A J-wire was kinked to measure appropriate catheter length. A subcutaneous port pocket was then created along the upper chest wall utilizing sharp and blunt dissection. Portable cautery was utilized. The pocket was irrigated with sterile saline. A single lumen power injectable port was chosen for placement. The 8 Fr catheter was tunneled from the port pocket site to the venotomy incision. The port was placed in the pocket. External catheter was trimmed  to appropriate length based on guidewire measurement. At the venotomy, an 8 Fr peel-away sheath was placed over a guidewire. The catheter was then placed through the sheath and the sheath removed. Final catheter positioning was confirmed and documented with a fluoroscopic spot image. The port was accessed with a needle and aspirated and flushed with heparinized saline. The access needle was removed. The venotomy and port pocket incisions were closed  with subcutaneous 3-0 Monocryl and subcuticular 4-0 Vicryl. Dermabond was applied to both incisions. COMPLICATIONS: COMPLICATIONS None FINDINGS: After catheter placement, the tip lies at the cavo-atrial junction. The catheter aspirates normally and is ready for immediate use. IMPRESSION: Placement of single lumen port a cath via left internal jugular vein. The catheter tip lies at the cavo-atrial junction. A power injectable port a cath was placed and is ready for immediate use. Electronically Signed   By: Aletta Edouard M.D.   On: 05/05/2018 15:42

## 2018-05-08 ENCOUNTER — Encounter: Payer: Self-pay | Admitting: Hematology

## 2018-05-08 ENCOUNTER — Inpatient Hospital Stay: Payer: BLUE CROSS/BLUE SHIELD

## 2018-05-08 ENCOUNTER — Other Ambulatory Visit: Payer: Self-pay

## 2018-05-08 ENCOUNTER — Encounter: Payer: Self-pay | Admitting: *Deleted

## 2018-05-08 ENCOUNTER — Inpatient Hospital Stay (HOSPITAL_BASED_OUTPATIENT_CLINIC_OR_DEPARTMENT_OTHER): Payer: BLUE CROSS/BLUE SHIELD | Admitting: Hematology

## 2018-05-08 VITALS — BP 162/88 | HR 102 | Temp 97.8°F | Resp 18

## 2018-05-08 DIAGNOSIS — D638 Anemia in other chronic diseases classified elsewhere: Secondary | ICD-10-CM

## 2018-05-08 DIAGNOSIS — E1140 Type 2 diabetes mellitus with diabetic neuropathy, unspecified: Secondary | ICD-10-CM | POA: Insufficient documentation

## 2018-05-08 DIAGNOSIS — G629 Polyneuropathy, unspecified: Secondary | ICD-10-CM | POA: Diagnosis not present

## 2018-05-08 DIAGNOSIS — D649 Anemia, unspecified: Secondary | ICD-10-CM

## 2018-05-08 DIAGNOSIS — R109 Unspecified abdominal pain: Secondary | ICD-10-CM

## 2018-05-08 DIAGNOSIS — C3431 Malignant neoplasm of lower lobe, right bronchus or lung: Secondary | ICD-10-CM

## 2018-05-08 DIAGNOSIS — Z791 Long term (current) use of non-steroidal anti-inflammatories (NSAID): Secondary | ICD-10-CM

## 2018-05-08 DIAGNOSIS — Z87891 Personal history of nicotine dependence: Secondary | ICD-10-CM

## 2018-05-08 DIAGNOSIS — Z79899 Other long term (current) drug therapy: Secondary | ICD-10-CM

## 2018-05-08 DIAGNOSIS — Z510 Encounter for antineoplastic radiation therapy: Secondary | ICD-10-CM | POA: Diagnosis not present

## 2018-05-08 DIAGNOSIS — Z720 Tobacco use: Secondary | ICD-10-CM

## 2018-05-08 DIAGNOSIS — Z7984 Long term (current) use of oral hypoglycemic drugs: Secondary | ICD-10-CM

## 2018-05-08 LAB — CBC WITH DIFFERENTIAL (CANCER CENTER ONLY)
Abs Immature Granulocytes: 0.02 10*3/uL (ref 0.00–0.07)
Basophils Absolute: 0.1 10*3/uL (ref 0.0–0.1)
Basophils Relative: 1 %
Eosinophils Absolute: 0.1 10*3/uL (ref 0.0–0.5)
Eosinophils Relative: 2 %
HCT: 35 % — ABNORMAL LOW (ref 39.0–52.0)
Hemoglobin: 11.7 g/dL — ABNORMAL LOW (ref 13.0–17.0)
Immature Granulocytes: 0 %
Lymphocytes Relative: 31 %
Lymphs Abs: 2 10*3/uL (ref 0.7–4.0)
MCH: 31 pg (ref 26.0–34.0)
MCHC: 33.4 g/dL (ref 30.0–36.0)
MCV: 92.8 fL (ref 80.0–100.0)
Monocytes Absolute: 0.5 10*3/uL (ref 0.1–1.0)
Monocytes Relative: 8 %
Neutro Abs: 3.7 10*3/uL (ref 1.7–7.7)
Neutrophils Relative %: 58 %
Platelet Count: 232 10*3/uL (ref 150–400)
RBC: 3.77 MIL/uL — AB (ref 4.22–5.81)
RDW: 11.8 % (ref 11.5–15.5)
WBC Count: 6.4 10*3/uL (ref 4.0–10.5)
nRBC: 0 % (ref 0.0–0.2)

## 2018-05-08 LAB — CMP (CANCER CENTER ONLY)
ALT: 36 U/L (ref 0–44)
AST: 15 U/L (ref 15–41)
Albumin: 4.3 g/dL (ref 3.5–5.0)
Alkaline Phosphatase: 95 U/L (ref 38–126)
Anion gap: 8 (ref 5–15)
BUN: 17 mg/dL (ref 6–20)
CALCIUM: 9.8 mg/dL (ref 8.9–10.3)
CO2: 27 mmol/L (ref 22–32)
Chloride: 103 mmol/L (ref 98–111)
Creatinine: 1.13 mg/dL (ref 0.61–1.24)
GFR, Est AFR Am: >60 mL/min (ref >60)
GFR, Estimated: >60 mL/min (ref >60)
Glucose, Bld: 285 mg/dL — ABNORMAL HIGH (ref 70–99)
Potassium: 4.2 mmol/L (ref 3.5–5.1)
Sodium: 138 mmol/L (ref 135–145)
Total Bilirubin: 0.3 mg/dL (ref 0.3–1.2)
Total Protein: 6.4 g/dL — ABNORMAL LOW (ref 6.5–8.1)

## 2018-05-08 MED ORDER — HEPARIN SOD (PORK) LOCK FLUSH 100 UNIT/ML IV SOLN
500.0000 [IU] | Freq: Once | INTRAVENOUS | Status: AC | PRN
  Administered 2018-05-08: 500 [IU]
  Filled 2018-05-08: qty 5

## 2018-05-08 MED ORDER — SODIUM CHLORIDE 0.9 % IV SOLN
20.0000 mg | Freq: Once | INTRAVENOUS | Status: AC
  Administered 2018-05-08: 20 mg via INTRAVENOUS
  Filled 2018-05-08: qty 2

## 2018-05-08 MED ORDER — DIPHENHYDRAMINE HCL 50 MG/ML IJ SOLN
INTRAMUSCULAR | Status: AC
  Filled 2018-05-08: qty 1

## 2018-05-08 MED ORDER — FAMOTIDINE IN NACL 20-0.9 MG/50ML-% IV SOLN
INTRAVENOUS | Status: AC
  Filled 2018-05-08: qty 50

## 2018-05-08 MED ORDER — SODIUM CHLORIDE 0.9 % IV SOLN
45.0000 mg/m2 | Freq: Once | INTRAVENOUS | Status: AC
  Administered 2018-05-08: 90 mg via INTRAVENOUS
  Filled 2018-05-08: qty 15

## 2018-05-08 MED ORDER — SODIUM CHLORIDE 0.9 % IV SOLN
Freq: Once | INTRAVENOUS | Status: AC
  Administered 2018-05-08: 11:00:00 via INTRAVENOUS
  Filled 2018-05-08: qty 250

## 2018-05-08 MED ORDER — PALONOSETRON HCL INJECTION 0.25 MG/5ML
INTRAVENOUS | Status: AC
  Filled 2018-05-08: qty 5

## 2018-05-08 MED ORDER — SODIUM CHLORIDE 0.9 % IV SOLN
199.6000 mg | Freq: Once | INTRAVENOUS | Status: AC
  Administered 2018-05-08: 200 mg via INTRAVENOUS
  Filled 2018-05-08: qty 20

## 2018-05-08 MED ORDER — PALONOSETRON HCL INJECTION 0.25 MG/5ML
0.2500 mg | Freq: Once | INTRAVENOUS | Status: AC
  Administered 2018-05-08: 0.25 mg via INTRAVENOUS

## 2018-05-08 MED ORDER — FAMOTIDINE IN NACL 20-0.9 MG/50ML-% IV SOLN
20.0000 mg | Freq: Once | INTRAVENOUS | Status: AC
  Administered 2018-05-08: 20 mg via INTRAVENOUS

## 2018-05-08 MED ORDER — SODIUM CHLORIDE 0.9% FLUSH
10.0000 mL | INTRAVENOUS | Status: DC | PRN
  Administered 2018-05-08: 10 mL
  Filled 2018-05-08: qty 10

## 2018-05-08 MED ORDER — DIPHENHYDRAMINE HCL 50 MG/ML IJ SOLN
50.0000 mg | Freq: Once | INTRAMUSCULAR | Status: AC
  Administered 2018-05-08: 50 mg via INTRAVENOUS

## 2018-05-08 NOTE — Patient Instructions (Signed)
[facility] Discharge Instructions for Patients Receiving Chemotherapy  Today you received the following chemotherapy agents Taxol and Carboplatin To help prevent nausea and vomiting after your treatment, we encourage you to take your nausea medication as directed BUT NO ZOFRAN FOR 3 DAYS   If you develop nausea and vomiting that is not controlled by your nausea medication, call the clinic.   BELOW ARE SYMPTOMS THAT SHOULD BE REPORTED IMMEDIATELY:  *FEVER GREATER THAN 100.5 F  *CHILLS WITH OR WITHOUT FEVER  NAUSEA AND VOMITING THAT IS NOT CONTROLLED WITH YOUR NAUSEA MEDICATION  *UNUSUAL SHORTNESS OF BREATH  *UNUSUAL BRUISING OR BLEEDING  TENDERNESS IN MOUTH AND THROAT WITH OR WITHOUT PRESENCE OF ULCERS  *URINARY PROBLEMS  *BOWEL PROBLEMS  UNUSUAL RASH Items with * indicate a potential emergency and should be followed up as soon as possible.  Feel free to call the clinic you have any questions or concerns. The clinic phone number is [phone].  Please show the [place] at check-in to the Emergency Department and triage nurse.

## 2018-05-08 NOTE — Progress Notes (Signed)
Ok to treat with HR today per MD Maylon Peppers   OK to proceed with chemo and NO HTN meds at this time in clinic.Patient did not take HTN meds this am so MD Maylon Peppers said for him to take it when he gets home. Patient has NO symptoms.

## 2018-05-12 ENCOUNTER — Telehealth: Payer: Self-pay | Admitting: *Deleted

## 2018-05-12 ENCOUNTER — Ambulatory Visit
Admission: RE | Admit: 2018-05-12 | Discharge: 2018-05-12 | Disposition: A | Discharge status: Home / Self Care | Payer: BLUE CROSS/BLUE SHIELD | Source: Ambulatory Visit | Attending: Radiation Oncology | Admitting: Radiation Oncology

## 2018-05-12 DIAGNOSIS — Z510 Encounter for antineoplastic radiation therapy: Secondary | ICD-10-CM | POA: Diagnosis not present

## 2018-05-12 NOTE — Telephone Encounter (Signed)
Message left for pt to call office back with any complaints or concerns after receiving first chemo on 05/08/18.

## 2018-05-13 ENCOUNTER — Ambulatory Visit
Admission: RE | Admit: 2018-05-13 | Discharge: 2018-05-13 | Disposition: A | Discharge status: Home / Self Care | Payer: BLUE CROSS/BLUE SHIELD | Source: Ambulatory Visit | Attending: Radiation Oncology | Admitting: Radiation Oncology

## 2018-05-13 DIAGNOSIS — Z510 Encounter for antineoplastic radiation therapy: Secondary | ICD-10-CM | POA: Diagnosis not present

## 2018-05-14 ENCOUNTER — Ambulatory Visit
Admission: RE | Admit: 2018-05-14 | Discharge: 2018-05-14 | Disposition: A | Discharge status: Home / Self Care | Payer: BLUE CROSS/BLUE SHIELD | Source: Ambulatory Visit | Attending: Radiation Oncology | Admitting: Radiation Oncology

## 2018-05-14 DIAGNOSIS — Z510 Encounter for antineoplastic radiation therapy: Secondary | ICD-10-CM | POA: Diagnosis not present

## 2018-05-15 ENCOUNTER — Encounter: Payer: Self-pay | Admitting: *Deleted

## 2018-05-15 ENCOUNTER — Inpatient Hospital Stay: Payer: BLUE CROSS/BLUE SHIELD

## 2018-05-15 ENCOUNTER — Ambulatory Visit
Admission: RE | Admit: 2018-05-15 | Discharge: 2018-05-15 | Disposition: A | Discharge status: Home / Self Care | Payer: BLUE CROSS/BLUE SHIELD | Source: Ambulatory Visit | Attending: Radiation Oncology | Admitting: Radiation Oncology

## 2018-05-15 ENCOUNTER — Inpatient Hospital Stay: Payer: BLUE CROSS/BLUE SHIELD | Admitting: Hematology

## 2018-05-15 ENCOUNTER — Encounter: Payer: Self-pay | Admitting: Hematology

## 2018-05-15 VITALS — BP 146/86 | HR 111 | Resp 17 | Ht 71.0 in | Wt 165.0 lb

## 2018-05-15 DIAGNOSIS — E1140 Type 2 diabetes mellitus with diabetic neuropathy, unspecified: Secondary | ICD-10-CM

## 2018-05-15 DIAGNOSIS — C7801 Secondary malignant neoplasm of right lung: Secondary | ICD-10-CM

## 2018-05-15 DIAGNOSIS — C3431 Malignant neoplasm of lower lobe, right bronchus or lung: Secondary | ICD-10-CM

## 2018-05-15 DIAGNOSIS — C801 Malignant (primary) neoplasm, unspecified: Principal | ICD-10-CM

## 2018-05-15 DIAGNOSIS — E119 Type 2 diabetes mellitus without complications: Secondary | ICD-10-CM | POA: Insufficient documentation

## 2018-05-15 DIAGNOSIS — Z79899 Other long term (current) drug therapy: Secondary | ICD-10-CM

## 2018-05-15 DIAGNOSIS — Z87891 Personal history of nicotine dependence: Secondary | ICD-10-CM

## 2018-05-15 DIAGNOSIS — D638 Anemia in other chronic diseases classified elsewhere: Secondary | ICD-10-CM | POA: Diagnosis not present

## 2018-05-15 DIAGNOSIS — G629 Polyneuropathy, unspecified: Secondary | ICD-10-CM

## 2018-05-15 DIAGNOSIS — E1142 Type 2 diabetes mellitus with diabetic polyneuropathy: Secondary | ICD-10-CM

## 2018-05-15 DIAGNOSIS — D649 Anemia, unspecified: Secondary | ICD-10-CM

## 2018-05-15 DIAGNOSIS — Z7984 Long term (current) use of oral hypoglycemic drugs: Secondary | ICD-10-CM

## 2018-05-15 DIAGNOSIS — Z791 Long term (current) use of non-steroidal anti-inflammatories (NSAID): Secondary | ICD-10-CM

## 2018-05-15 DIAGNOSIS — Z510 Encounter for antineoplastic radiation therapy: Secondary | ICD-10-CM | POA: Diagnosis not present

## 2018-05-15 LAB — CMP (CANCER CENTER ONLY)
ALT: 23 U/L (ref 0–44)
AST: 11 U/L — ABNORMAL LOW (ref 15–41)
Albumin: 3.9 g/dL (ref 3.5–5.0)
Alkaline Phosphatase: 84 U/L (ref 38–126)
Anion gap: 8 (ref 5–15)
BUN: 13 mg/dL (ref 6–20)
CO2: 28 mmol/L (ref 22–32)
Calcium: 9.5 mg/dL (ref 8.9–10.3)
Chloride: 102 mmol/L (ref 98–111)
Creatinine: 1.08 mg/dL (ref 0.61–1.24)
GFR, Est AFR Am: >60 mL/min (ref >60)
GFR, Estimated: >60 mL/min (ref >60)
Glucose, Bld: 384 mg/dL — ABNORMAL HIGH (ref 70–99)
Potassium: 4.4 mmol/L (ref 3.5–5.1)
Sodium: 138 mmol/L (ref 135–145)
Total Bilirubin: 0.3 mg/dL (ref 0.3–1.2)
Total Protein: 6 g/dL — ABNORMAL LOW (ref 6.5–8.1)

## 2018-05-15 LAB — CBC WITH DIFFERENTIAL (CANCER CENTER ONLY)
Abs Immature Granulocytes: 0.08 10*3/uL — ABNORMAL HIGH (ref 0.00–0.07)
Basophils Absolute: 0 10*3/uL (ref 0.0–0.1)
Basophils Relative: 1 %
Eosinophils Absolute: 0.2 10*3/uL (ref 0.0–0.5)
Eosinophils Relative: 3 %
HCT: 33.4 % — ABNORMAL LOW (ref 39.0–52.0)
Hemoglobin: 11 g/dL — ABNORMAL LOW (ref 13.0–17.0)
Immature Granulocytes: 1 %
Lymphocytes Relative: 27 %
Lymphs Abs: 1.7 10*3/uL (ref 0.7–4.0)
MCH: 31 pg (ref 26.0–34.0)
MCHC: 32.9 g/dL (ref 30.0–36.0)
MCV: 94.1 fL (ref 80.0–100.0)
Monocytes Absolute: 0.4 10*3/uL (ref 0.1–1.0)
Monocytes Relative: 7 %
Neutro Abs: 3.7 10*3/uL (ref 1.7–7.7)
Neutrophils Relative %: 61 %
Platelet Count: 248 10*3/uL (ref 150–400)
RBC: 3.55 MIL/uL — ABNORMAL LOW (ref 4.22–5.81)
RDW: 11.8 % (ref 11.5–15.5)
WBC Count: 6.1 10*3/uL (ref 4.0–10.5)
nRBC: 0 % (ref 0.0–0.2)

## 2018-05-15 MED ORDER — SODIUM CHLORIDE 0.9 % IV SOLN
20.0000 mg | Freq: Once | INTRAVENOUS | Status: AC
  Administered 2018-05-15: 20 mg via INTRAVENOUS
  Filled 2018-05-15: qty 2

## 2018-05-15 MED ORDER — HEPARIN SOD (PORK) LOCK FLUSH 100 UNIT/ML IV SOLN
500.0000 [IU] | Freq: Once | INTRAVENOUS | Status: AC | PRN
  Administered 2018-05-15: 500 [IU]
  Filled 2018-05-15: qty 5

## 2018-05-15 MED ORDER — DIPHENHYDRAMINE HCL 50 MG/ML IJ SOLN
INTRAMUSCULAR | Status: AC
  Filled 2018-05-15: qty 1

## 2018-05-15 MED ORDER — TRAMADOL HCL 50 MG PO TABS: 50.0000 mg | ORAL_TABLET | Freq: Two times a day (BID) | ORAL | 2 refills | Status: DC

## 2018-05-15 MED ORDER — DIPHENHYDRAMINE HCL 50 MG/ML IJ SOLN
50.0000 mg | Freq: Once | INTRAMUSCULAR | Status: AC
  Administered 2018-05-15: 50 mg via INTRAVENOUS

## 2018-05-15 MED ORDER — SODIUM CHLORIDE 0.9 % IV SOLN
206.6000 mg | Freq: Once | INTRAVENOUS | Status: AC
  Administered 2018-05-15: 210 mg via INTRAVENOUS
  Filled 2018-05-15: qty 21

## 2018-05-15 MED ORDER — FAMOTIDINE IN NACL 20-0.9 MG/50ML-% IV SOLN
INTRAVENOUS | Status: AC
  Filled 2018-05-15: qty 50

## 2018-05-15 MED ORDER — SONAFINE EX EMUL
1.0000 "application " | Freq: Once | CUTANEOUS | Status: AC
  Administered 2018-05-15: 1 via TOPICAL

## 2018-05-15 MED ORDER — SODIUM CHLORIDE 0.9% FLUSH
10.0000 mL | INTRAVENOUS | Status: DC | PRN
  Administered 2018-05-15: 10 mL
  Filled 2018-05-15: qty 10

## 2018-05-15 MED ORDER — GABAPENTIN 300 MG PO CAPS: 300.0000 mg | ORAL_CAPSULE | Freq: Two times a day (BID) | ORAL | 3 refills | Status: DC

## 2018-05-15 MED ORDER — SODIUM CHLORIDE 0.9 % IV SOLN
45.0000 mg/m2 | Freq: Once | INTRAVENOUS | Status: AC
  Administered 2018-05-15: 90 mg via INTRAVENOUS
  Filled 2018-05-15: qty 15

## 2018-05-15 MED ORDER — PALONOSETRON HCL INJECTION 0.25 MG/5ML
INTRAVENOUS | Status: AC
  Filled 2018-05-15: qty 5

## 2018-05-15 MED ORDER — PALONOSETRON HCL INJECTION 0.25 MG/5ML
0.2500 mg | Freq: Once | INTRAVENOUS | Status: AC
  Administered 2018-05-15: 0.25 mg via INTRAVENOUS

## 2018-05-15 MED ORDER — SODIUM CHLORIDE 0.9 % IV SOLN
Freq: Once | INTRAVENOUS | Status: AC
  Administered 2018-05-15: 11:00:00 via INTRAVENOUS
  Filled 2018-05-15: qty 250

## 2018-05-15 MED ORDER — FAMOTIDINE IN NACL 20-0.9 MG/50ML-% IV SOLN
20.0000 mg | Freq: Once | INTRAVENOUS | Status: AC
  Administered 2018-05-15: 20 mg via INTRAVENOUS

## 2018-05-15 NOTE — Patient Instructions (Signed)

## 2018-05-15 NOTE — Progress Notes (Signed)
[facility] OFFICE PROGRESS NOTE  Patient Care Team: Patient, No Pcp Per as PCP - General (General Practice) Travis Poche, Travis [name] as Oncology Nurse Navigator Travis Men, Travis [name] as Medical Oncologist (Hematology)  HEME/ONC OVERVIEW: 1. Stage IIB (cT3N0M0) adenocarcinoma of the right lung  -03/2018: CT CAP showed a 4.2 x 3.3 x 3.1cm RLL mass extending to RUL, additional small noncalcified nodules throughout the right lung, concerning for lung metastases; no metastatic disease in the abdomen/pelvis   -PET showed FDG-avid RLL mass 5.3 x 3.6cm and a satellite nodule in RLL; borderline FDG-avid (SUV 2.6) paratracheal LN   -MRI brain negative for mets   -EBUS w/ bx of the RLL mass showed poorly differentiated adenoCa with features of squamous cell differentiation; 4R LN negative for malignancy -04/2018 - present: definitive chemoradiation with weekly carbo/Taxol  2. Port placed in 04/2018  TREATMENT REGIMEN:  05/08/2018 - present: weekly carbo/Taxol concurrent with radiation; plan for 7 cycles   PERTINENT NON-HEM/ONC PROBLEMS: 1. Hx of tobacco use, quit in early 03/2018  ASSESSMENT & PLAN:   Stage IIB (cT3N0M0) adenocarcinoma of the right lung  -Patient tolerated Cycle 1 of weekly carbo/Taxol relatively well without significant side effects -Labs appropriate, proceed with Cycle 2 of carbo/Taxol today -We will plan to see the patient every other cycle of chemotherapy to monitor toxicity -PRN medications: Zofran, Compazine, Ativan  Normocytic anemia -Secondary to anemia chronic disease and chemotherapy -Hgb 11.0, stable -Patient denies any symptoms of bleeding -We will monitor for now; no indication for dose adjustment  Poorly controlled DM -Glucose 384 today; patient is asymptomatic -He is currently on metformin only -I encouraged patient to maintain adequate fluid intake, and to follow up with his PCP next week for further management of his diabetes, including possible  initiation of insulin  Diabetic neuropathy -Patient reports tonic intermittent numbness in the fingertips of the right hand and tingling sensation in the toes; no change since starting treatment -I have prescribed gabapentin 300 mg daily and if he tolerates it well, we can increase it to BID next week -Patient is currently taking OTC Tylenol for arthritis/neuropathic pain -Given Tylenol can mask fever, I instructed the patient to stop Tylenol, and have prescribed tramadol PRN for pain   Polypharmacy -Patient appears to have very limited understanding of his medication regimen, and cannot detail how he takes some of his medications -I encouraged patient to bring all his medication bottles to the next clinic visit, so that we can review his medications and ensure that he is taking them properly  All questions were answered. The patient knows to call the clinic with any problems, questions or concerns. No barriers to learning was detected.  Return in 2 weeks for labs, port flush, clinic appt and chemotherapy.   Travis Men, Travis [name] 05/15/2018 11:23 AM  CHIEF COMPLAINT: "I am doing okay"  INTERVAL HISTORY: Travis [name] return to clinic for follow-up of lung cancer and second cycle of weekly carbo/Taxol.  Patient reports that he has chronic intermittent numbness in the fingertips of the right hand, 1-2 times per day, lasting a few minutes at a time, and resolves without any intervention.  He also reports chronic intermittent tingling sensation in the toes bilaterally, usually elicited by cold temperature, but he denies any persistent tingling sensation.  He tolerated cycle 1 of carbo/Taxol relatively well without significant side effects.  His neuropathy symptoms have not changed since starting chemotherapy.  He denies any other complaint today.  SUMMARY OF ONCOLOGIC HISTORY:  Lung cancer ([place])   03/31/2018 Imaging    CT CAP with contrast: IMPRESSION: 1. 4.2 x 3.3 x 3.1 cm irregularly marginated mass  within the superior segment of the right lower lobe appearing to extend across the fissure into the posterior inferior right upper lobe and extending toward the right hilum where the right upper lobe superior segment bronchus is occluded, consistent with primary lung carcinoma. 2. Small noncalcified nodules more numerous throughout the right lung most consistent with lung metastases. 3. No metastatic involvement of the abdomen or pelvis by CT. 4. Moderate thoracic and abdominal aortic atherosclerosis.    04/03/2018 Initial Diagnosis    Lung cancer ([place])    05/08/2018 -  Chemotherapy    The patient had palonosetron (ALOXI) injection 0.25 mg, 0.25 mg, Intravenous,  Once, 2 of 7 cycles Administration: 0.25 mg (05/08/2018) CARBOplatin (PARAPLATIN) 200 mg in sodium chloride 0.9 % 250 mL chemo infusion, 200 mg (92.5 % of original dose 215.8 mg), Intravenous,  Once, 2 of 7 cycles Dose modification:   (original dose 215.8 mg, Cycle 1) Administration: 200 mg (05/08/2018) PACLitaxel (TAXOL) 90 mg in sodium chloride 0.9 % 250 mL chemo infusion (</= 80mg /m2), 45 mg/m2 = 90 mg, Intravenous,  Once, 2 of 7 cycles Administration: 90 mg (05/08/2018)  for chemotherapy treatment.      REVIEW OF SYSTEMS:   Constitutional: ( - ) fevers, ( - )  chills , ( - ) night sweats Eyes: ( - ) blurriness of vision, ( - ) double vision, ( - ) watery eyes Ears, nose, mouth, throat, and face: ( - ) mucositis, ( - ) sore throat Respiratory: ( - ) cough, ( - ) dyspnea, ( - ) wheezes Cardiovascular: ( - ) palpitation, ( - ) chest discomfort, ( - ) lower extremity swelling Gastrointestinal:  ( - ) nausea, ( - ) heartburn, ( - ) change in bowel habits Skin: ( - ) abnormal skin rashes Lymphatics: ( - ) new lymphadenopathy, ( - ) easy bruising Neurological: ( + ) numbness, ( + ) tingling, ( - ) new weaknesses Behavioral/Psych: ( - ) mood change, ( - ) new changes  All other systems were reviewed with the patient and are  negative.  I have reviewed the past medical history, past surgical history, social history and family history with the patient and they are unchanged from previous note.  ALLERGIES:  has No Known Allergies.  MEDICATIONS:  Current Outpatient Medications  Medication Sig Dispense Refill  . amitriptyline (ELAVIL) 25 MG tablet Take 50 mg by mouth at bedtime.    Marland Kitchen amLODipine (NORVASC) 5 MG tablet Take 5 mg by mouth daily.    Marland Kitchen dexamethasone (DECADRON) 4 MG tablet Take 2 tablets (8 mg total) by mouth daily. Start the day after chemotherapy for 2 days. 30 tablet 1  . diclofenac (VOLTAREN) 75 MG EC tablet Take 75 mg by mouth 2 (two) times daily.    Marland Kitchen gabapentin (NEURONTIN) 300 MG capsule Take 1 capsule (300 mg total) by mouth 2 (two) times daily for 30 days. 60 capsule 3  . lidocaine-prilocaine (EMLA) cream Apply to affected area once 30 g 3  . LORazepam (ATIVAN) 0.5 MG tablet Take 1 tablet (0.5 mg total) by mouth every 6 (six) hours as needed (Nausea or vomiting). 30 tablet 0  . metFORMIN (GLUCOPHAGE-XR) 500 MG 24 hr tablet Take 1,000 mg by mouth 2 (two) times daily.    . mometasone (ELOCON) 0.1 % cream Apply thin layer to  affected areas bid prn    . ondansetron (ZOFRAN) 8 MG tablet Take 1 tablet (8 mg total) by mouth 2 (two) times daily as needed for refractory nausea / vomiting. Start on day 3 after chemo. 30 tablet 1  . prochlorperazine (COMPAZINE) 10 MG tablet Take 1 tablet (10 mg total) by mouth every 6 (six) hours as needed (Nausea or vomiting). 30 tablet 1  . traMADol (ULTRAM) 50 MG tablet Take 1 tablet (50 mg total) by mouth 2 (two) times daily for 30 days. 60 tablet 2   No current facility-administered medications for this visit.    Facility-Administered Medications Ordered in Other Visits  Medication Dose Route Frequency Provider Last Rate Last Dose  . CARBOplatin (PARAPLATIN) 210 mg in sodium chloride 0.9 % 250 mL chemo infusion  210 mg Intravenous Once Travis Men, Travis [name]      .  dexamethasone (DECADRON) 20 mg in sodium chloride 0.9 % 50 mL IVPB  20 mg Intravenous Once Volanda Napoleon, Travis [name]      . famotidine (PEPCID) IVPB 20 mg premix  20 mg Intravenous Once Travis Men, Travis [name] 200 mL/hr at 05/15/18 1122 20 mg at 05/15/18 1122  . heparin lock flush 100 unit/mL  500 Units Intracatheter Once PRN Travis Men, Travis [name]      . PACLitaxel (TAXOL) 90 mg in sodium chloride 0.9 % 250 mL chemo infusion (</= 80mg /m2)  45 mg/m2 (Treatment Plan Recorded) Intravenous Once Travis Men, Travis [name]      . sodium chloride flush (NS) 0.9 % injection 10 mL  10 mL Intracatheter PRN Travis Men, Travis [name]      . Rennis Chris emulsion 1 application  1 application Topical Once Travis Pedro, PA-C        PHYSICAL EXAMINATION: ECOG PERFORMANCE STATUS: 1 - Symptomatic but completely ambulatory  Today's Vitals   05/15/18 1000  BP: (!) 146/86  Pulse: (!) 111  Resp: 17  SpO2: 100%  Weight: 165 lb (74.8 kg)  Height: 5\' 11"  (1.803 m)   Body mass index is 23.01 kg/m.  Filed Weights   05/15/18 1000  Weight: 165 lb (74.8 kg)    GENERAL: alert, no distress and comfortable, thin SKIN: skin color, texture, turgor are normal, no rashes or significant lesions EYES: conjunctiva are pink and non-injected, sclera clear OROPHARYNX: no exudate, no erythema; lips, buccal mucosa, and tongue normal  NECK: supple, non-tender LYMPH:  no palpable lymphadenopathy in the cervical  LUNGS: clear to auscultation with normal breathing effort HEART: regular rate & rhythm and no murmurs and no lower extremity edema ABDOMEN: soft, non-tender, non-distended, normal bowel sounds Musculoskeletal: no cyanosis of digits and no clubbing  PSYCH: alert & oriented x 3, fluent speech NEURO: no focal motor/sensory deficits  LABORATORY DATA:  I have reviewed the data as listed    Component Value Date/Time   NA 138 05/15/2018 1039   K 4.4 05/15/2018 1039   CL 102 05/15/2018 1039   CO2 28 05/15/2018 1039   GLUCOSE 384 (H) 05/15/2018 1039   BUN  13 05/15/2018 1039   CREATININE 1.08 05/15/2018 1039   CALCIUM 9.5 05/15/2018 1039   PROT 6.0 (L) 05/15/2018 1039   ALBUMIN 3.9 05/15/2018 1039   AST 11 (L) 05/15/2018 1039   ALT 23 05/15/2018 1039   ALKPHOS 84 05/15/2018 1039   BILITOT 0.3 05/15/2018 1039   GFRNONAA >60 05/15/2018 1039   GFRAA >60 05/15/2018 1039    No results found for: SPEP, UPEP  Lab Results  Component Value  Date   WBC 6.1 05/15/2018   NEUTROABS 3.7 05/15/2018   HGB 11.0 (L) 05/15/2018   HCT 33.4 (L) 05/15/2018   MCV 94.1 05/15/2018   PLT 248 05/15/2018      Chemistry      Component Value Date/Time   NA 138 05/15/2018 1039   K 4.4 05/15/2018 1039   CL 102 05/15/2018 1039   CO2 28 05/15/2018 1039   BUN 13 05/15/2018 1039   CREATININE 1.08 05/15/2018 1039      Component Value Date/Time   CALCIUM 9.5 05/15/2018 1039   ALKPHOS 84 05/15/2018 1039   AST 11 (L) 05/15/2018 1039   ALT 23 05/15/2018 1039   BILITOT 0.3 05/15/2018 1039       RADIOGRAPHIC STUDIES: I have personally reviewed the radiological images as listed below and agreed with the findings in the report. Travis [name] Wo Contrast  Result Date: 04/18/2018 CLINICAL DATA:  Lung mass.  Intracranial staging EXAM: MRI HEAD WITHOUT AND WITH CONTRAST TECHNIQUE: Multiplanar, multiecho pulse sequences of the brain and surrounding structures were obtained without and with intravenous contrast. CONTRAST:  7.2 cc Gadavist intravenous COMPARISON:  08/25/2017 head CT FINDINGS: Brain: No mass or swelling to suggest metastatic disease. No infarction, hemorrhage, hydrocephalus, extra-axial collection or mass lesion. Vascular: Normal flow voids. Skull and upper cervical spine: Normal marrow signal. Sinuses/Orbits: Negative IMPRESSION: Negative for brain metastasis. Electronically Signed   By: Monte Fantasia M.D.   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 EXAM: IMPLANTED PORT A CATH PLACEMENT WITH ULTRASOUND AND FLUOROSCOPIC GUIDANCE ANESTHESIA/SEDATION: 2.0 mg IV Versed; 100 mcg IV Fentanyl Total Moderate Sedation Time:  39 minutes The patient's level of consciousness and physiologic status were continuously monitored during the procedure by Radiology nursing. Additional Medications: 2 g IV Ancef. FLUOROSCOPY TIME:  30 seconds.  4.7 mGy. PROCEDURE: The procedure, risks, benefits, and alternatives were explained to the patient. Questions regarding the procedure were encouraged and answered. The patient understands and consents to the procedure. A time-out was performed prior to initiating the procedure. Ultrasound was utilized to confirm patency of the left internal jugular vein. The left neck and chest were prepped with chlorhexidine in a sterile fashion, and a sterile drape was applied covering the operative field. Maximum barrier sterile technique with sterile gowns and gloves were used for the procedure. Local anesthesia was provided with 1% lidocaine. After creating a small venotomy incision, a 21 gauge needle was advanced into the left internal jugular vein under direct, real-time ultrasound guidance. Ultrasound image documentation was performed. After securing guidewire access, an 8 Fr dilator was placed. A J-wire was kinked to measure appropriate catheter length. A subcutaneous port pocket was then created along the upper chest wall utilizing sharp and blunt dissection. Portable cautery was utilized. The pocket was irrigated with sterile saline. A single lumen power injectable port was chosen for placement. The 8 Fr catheter was tunneled from the port pocket site to the venotomy incision. The port was placed in the pocket. External catheter was trimmed to appropriate length based on guidewire measurement. At the venotomy, an 8 Fr peel-away sheath was placed over a guidewire. The catheter was then placed through the  sheath and the sheath removed. Final catheter positioning was confirmed and documented with a fluoroscopic spot image. The port was accessed with a needle and aspirated and  flushed with heparinized saline. The access needle was removed. The venotomy and port pocket incisions were closed with subcutaneous 3-0 Monocryl and subcuticular 4-0 Vicryl. Dermabond was applied to both incisions. COMPLICATIONS: COMPLICATIONS None FINDINGS: After catheter placement, the tip lies at the cavo-atrial junction. The catheter aspirates normally and is ready for immediate use. IMPRESSION: Placement of single lumen port a cath via left internal jugular vein. The catheter tip lies at the cavo-atrial junction. A power injectable port a cath was placed and is ready for immediate use. Electronically Signed   By: Aletta Edouard M.D.   On: 05/05/2018 15:42

## 2018-05-15 NOTE — Patient Instructions (Signed)

## 2018-05-15 NOTE — Progress Notes (Signed)
Pt here for patient teaching.  Pt given Radiation and You booklet and Sonafine.  Reviewed areas of pertinence such as fatigue, hair loss, skin changes, throat changes, cough and shortness of breath . Pt able to give teach back of to pat skin and use unscented/gentle soap,apply Sonafine bid and avoid applying anything to skin within 4 hours of treatment. Pt verbalizes understanding of information given and will contact nursing with any questions or concerns.     Gloriajean Dell. Leonie Green, BSN

## 2018-05-15 NOTE — Progress Notes (Signed)
  Radiation Oncology         [phone] ________________________________  Name: Travis [name] MRN: [id number]  Date: 05/05/2018  DOB: 09-08-1955  SIMULATION AND TREATMENT PLANNING NOTE  DIAGNOSIS:     ICD-10-CM   1. 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  Identity was confirmed.  All relevant records and images related to the planned course of therapy were reviewed.   Written consent to proceed with treatment was confirmed which was freely given after reviewing the details related to the planned course of therapy had been reviewed with the patient.  Then, the patient was set-up in a stable reproducible  supine position for radiation therapy.  CT images were obtained.  Surface markings were placed.    Medically necessary complex treatment device(s) for immobilization:  Vac-lock bag.   The CT images were loaded into the planning software.  Then the target and avoidance structures were contoured.  Treatment planning then occurred.  The radiation prescription was entered and confirmed.  A total of 4 complex treatment devices were fabricated which relate to the designed radiation treatment fields. Additional reduced fields will be used as necessary to improve the dose homogeneity of the plan. Each of these customized fields/ complex treatment devices will be used on a daily basis during the radiation course. I have requested : 3D Simulation  I have requested a DVH of the following structures: target volume, spinal cord, lungs, heart.   The patient will undergo daily image guidance to ensure accurate localization of the target, and adequate minimize dose to the normal surrounding structures in close proximity to the target.  PLAN:  The patient will receive 60 Gy in 30 fractions initially. The patient will then receive a 6 Gy boost for  a final dose of 66 Gy.   Special treatment procedure The patient will also receive concurrent chemotherapy during the treatment. The patient may therefore experience increased toxicity or side effects and the patient will be monitored for such problems. This may require extra lab work as necessary. This therefore constitutes a special treatment procedure.   ________________________________   Jodelle Gross, MD, PhD

## 2018-05-18 ENCOUNTER — Ambulatory Visit
Admission: RE | Admit: 2018-05-18 | Discharge: 2018-05-18 | Disposition: A | Discharge status: Home / Self Care | Payer: BLUE CROSS/BLUE SHIELD | Source: Ambulatory Visit | Attending: Radiation Oncology | Admitting: Radiation Oncology

## 2018-05-18 DIAGNOSIS — Z510 Encounter for antineoplastic radiation therapy: Secondary | ICD-10-CM | POA: Diagnosis not present

## 2018-05-19 ENCOUNTER — Institutional Professional Consult (permissible substitution): Payer: 59 | Admitting: Pulmonary Disease

## 2018-05-19 ENCOUNTER — Ambulatory Visit
Admission: RE | Admit: 2018-05-19 | Discharge: 2018-05-19 | Disposition: A | Discharge status: Home / Self Care | Payer: BLUE CROSS/BLUE SHIELD | Source: Ambulatory Visit | Attending: Radiation Oncology | Admitting: Radiation Oncology

## 2018-05-19 DIAGNOSIS — Z510 Encounter for antineoplastic radiation therapy: Secondary | ICD-10-CM | POA: Diagnosis not present

## 2018-05-20 ENCOUNTER — Ambulatory Visit
Admission: RE | Admit: 2018-05-20 | Discharge: 2018-05-20 | Disposition: A | Discharge status: Home / Self Care | Payer: BLUE CROSS/BLUE SHIELD | Source: Ambulatory Visit | Attending: Radiation Oncology | Admitting: Radiation Oncology

## 2018-05-20 DIAGNOSIS — Z510 Encounter for antineoplastic radiation therapy: Secondary | ICD-10-CM | POA: Diagnosis not present

## 2018-05-21 ENCOUNTER — Encounter: Payer: Self-pay | Admitting: Hematology & Oncology

## 2018-05-21 ENCOUNTER — Ambulatory Visit
Admission: RE | Admit: 2018-05-21 | Discharge: 2018-05-21 | Disposition: A | Discharge status: Home / Self Care | Payer: BLUE CROSS/BLUE SHIELD | Source: Ambulatory Visit | Attending: Radiation Oncology | Admitting: Radiation Oncology

## 2018-05-21 DIAGNOSIS — Z510 Encounter for antineoplastic radiation therapy: Secondary | ICD-10-CM | POA: Diagnosis not present

## 2018-05-22 ENCOUNTER — Inpatient Hospital Stay: Payer: BLUE CROSS/BLUE SHIELD

## 2018-05-22 ENCOUNTER — Other Ambulatory Visit: Payer: Self-pay | Admitting: Hematology

## 2018-05-22 ENCOUNTER — Encounter: Payer: Self-pay | Admitting: *Deleted

## 2018-05-22 ENCOUNTER — Other Ambulatory Visit: Payer: Self-pay

## 2018-05-22 ENCOUNTER — Ambulatory Visit
Admission: RE | Admit: 2018-05-22 | Discharge: 2018-05-22 | Disposition: A | Discharge status: Home / Self Care | Payer: BLUE CROSS/BLUE SHIELD | Source: Ambulatory Visit | Attending: Radiation Oncology | Admitting: Radiation Oncology

## 2018-05-22 VITALS — BP 125/75 | HR 110 | Temp 97.7°F | Resp 18

## 2018-05-22 DIAGNOSIS — C3431 Malignant neoplasm of lower lobe, right bronchus or lung: Secondary | ICD-10-CM

## 2018-05-22 DIAGNOSIS — Z510 Encounter for antineoplastic radiation therapy: Secondary | ICD-10-CM | POA: Diagnosis not present

## 2018-05-22 DIAGNOSIS — C801 Malignant (primary) neoplasm, unspecified: Principal | ICD-10-CM

## 2018-05-22 DIAGNOSIS — C7801 Secondary malignant neoplasm of right lung: Secondary | ICD-10-CM

## 2018-05-22 LAB — CBC WITH DIFFERENTIAL (CANCER CENTER ONLY)
Abs Immature Granulocytes: 0.06 10*3/uL (ref 0.00–0.07)
Basophils Absolute: 0 10*3/uL (ref 0.0–0.1)
Basophils Relative: 0 %
Eosinophils Absolute: 0 10*3/uL (ref 0.0–0.5)
Eosinophils Relative: 1 %
HCT: 32.5 % — ABNORMAL LOW (ref 39.0–52.0)
Hemoglobin: 10.8 g/dL — ABNORMAL LOW (ref 13.0–17.0)
Immature Granulocytes: 1 %
Lymphocytes Relative: 21 %
Lymphs Abs: 1.2 10*3/uL (ref 0.7–4.0)
MCH: 31 pg (ref 26.0–34.0)
MCHC: 33.2 g/dL (ref 30.0–36.0)
MCV: 93.4 fL (ref 80.0–100.0)
Monocytes Absolute: 0.4 10*3/uL (ref 0.1–1.0)
Monocytes Relative: 6 %
Neutro Abs: 4.1 10*3/uL (ref 1.7–7.7)
Neutrophils Relative %: 71 %
Platelet Count: 231 10*3/uL (ref 150–400)
RBC: 3.48 MIL/uL — ABNORMAL LOW (ref 4.22–5.81)
RDW: 12 % (ref 11.5–15.5)
WBC Count: 5.7 10*3/uL (ref 4.0–10.5)
nRBC: 0 % (ref 0.0–0.2)

## 2018-05-22 LAB — CMP (CANCER CENTER ONLY)
ALT: 27 U/L (ref 0–44)
AST: 13 U/L — ABNORMAL LOW (ref 15–41)
Albumin: 3.7 g/dL (ref 3.5–5.0)
Alkaline Phosphatase: 89 U/L (ref 38–126)
Anion gap: 8 (ref 5–15)
BUN: 17 mg/dL (ref 6–20)
CALCIUM: 8.9 mg/dL (ref 8.9–10.3)
CO2: 24 mmol/L (ref 22–32)
Chloride: 104 mmol/L (ref 98–111)
Creatinine: 1.28 mg/dL — ABNORMAL HIGH (ref 0.61–1.24)
GFR, Est AFR Am: >60 mL/min (ref >60)
GFR, Estimated: >60 mL/min (ref >60)
Glucose, Bld: 325 mg/dL — ABNORMAL HIGH (ref 70–99)
Potassium: 4.5 mmol/L (ref 3.5–5.1)
Sodium: 136 mmol/L (ref 135–145)
Total Bilirubin: 0.3 mg/dL (ref 0.3–1.2)
Total Protein: 6.1 g/dL — ABNORMAL LOW (ref 6.5–8.1)

## 2018-05-22 MED ORDER — SODIUM CHLORIDE 0.9% FLUSH
10.0000 mL | Freq: Once | INTRAVENOUS | Status: AC
  Administered 2018-05-22: 10 mL
  Filled 2018-05-22: qty 10

## 2018-05-22 MED ORDER — PALONOSETRON HCL INJECTION 0.25 MG/5ML
INTRAVENOUS | Status: AC
  Filled 2018-05-22: qty 5

## 2018-05-22 MED ORDER — SODIUM CHLORIDE 0.9 % IV SOLN
Freq: Once | INTRAVENOUS | Status: DC
  Filled 2018-05-22: qty 250

## 2018-05-22 MED ORDER — FAMOTIDINE IN NACL 20-0.9 MG/50ML-% IV SOLN
20.0000 mg | Freq: Once | INTRAVENOUS | Status: AC
  Administered 2018-05-22: 20 mg via INTRAVENOUS

## 2018-05-22 MED ORDER — DIPHENHYDRAMINE HCL 50 MG/ML IJ SOLN
50.0000 mg | Freq: Once | INTRAMUSCULAR | Status: AC
  Administered 2018-05-22: 50 mg via INTRAVENOUS

## 2018-05-22 MED ORDER — SODIUM CHLORIDE 0.9 % IV SOLN
182.2000 mg | Freq: Once | INTRAVENOUS | Status: AC
  Administered 2018-05-22: 180 mg via INTRAVENOUS
  Filled 2018-05-22: qty 18

## 2018-05-22 MED ORDER — PALONOSETRON HCL INJECTION 0.25 MG/5ML
0.2500 mg | Freq: Once | INTRAVENOUS | Status: AC
  Administered 2018-05-22: 0.25 mg via INTRAVENOUS

## 2018-05-22 MED ORDER — SODIUM CHLORIDE 0.9% FLUSH
10.0000 mL | INTRAVENOUS | Status: DC | PRN
  Administered 2018-05-22: 10 mL
  Filled 2018-05-22: qty 10

## 2018-05-22 MED ORDER — SODIUM CHLORIDE 0.9 % IV SOLN
20.0000 mg | Freq: Once | INTRAVENOUS | Status: AC
  Administered 2018-05-22: 20 mg via INTRAVENOUS
  Filled 2018-05-22: qty 2

## 2018-05-22 MED ORDER — HEPARIN SOD (PORK) LOCK FLUSH 100 UNIT/ML IV SOLN
500.0000 [IU] | Freq: Once | INTRAVENOUS | Status: AC | PRN
  Administered 2018-05-22: 500 [IU]
  Filled 2018-05-22: qty 5

## 2018-05-22 MED ORDER — SODIUM CHLORIDE 0.9 % IV SOLN
45.0000 mg/m2 | Freq: Once | INTRAVENOUS | Status: AC
  Administered 2018-05-22: 90 mg via INTRAVENOUS
  Filled 2018-05-22: qty 15

## 2018-05-22 MED ORDER — DIPHENHYDRAMINE HCL 50 MG/ML IJ SOLN
INTRAMUSCULAR | Status: AC
  Filled 2018-05-22: qty 1

## 2018-05-22 MED ORDER — FAMOTIDINE IN NACL 20-0.9 MG/50ML-% IV SOLN
INTRAVENOUS | Status: AC
  Filled 2018-05-22: qty 50

## 2018-05-22 MED ORDER — SODIUM CHLORIDE 0.9 % IV SOLN
Freq: Once | INTRAVENOUS | Status: AC
  Administered 2018-05-22: 12:00:00 via INTRAVENOUS
  Filled 2018-05-22: qty 250

## 2018-05-22 NOTE — Patient Instructions (Signed)
[facility] Discharge Instructions for Patients Receiving Chemotherapy  Today you received the following chemotherapy agents Taxol/Carboplatin  To help prevent nausea and vomiting after your treatment, we encourage you to take your nausea medication as prescribed.   If you develop nausea and vomiting that is not controlled by your nausea medication, call the clinic.   BELOW ARE SYMPTOMS THAT SHOULD BE REPORTED IMMEDIATELY:  *FEVER GREATER THAN 100.5 F  *CHILLS WITH OR WITHOUT FEVER  NAUSEA AND VOMITING THAT IS NOT CONTROLLED WITH YOUR NAUSEA MEDICATION  *UNUSUAL SHORTNESS OF BREATH  *UNUSUAL BRUISING OR BLEEDING  TENDERNESS IN MOUTH AND THROAT WITH OR WITHOUT PRESENCE OF ULCERS  *URINARY PROBLEMS  *BOWEL PROBLEMS  UNUSUAL RASH Items with * indicate a potential emergency and should be followed up as soon as possible.  Feel free to call the clinic should you have any questions or concerns. The clinic phone number is [phone].  Please show the CHEMO ALERT CARD at check-in to the Emergency Department and triage nurse.   

## 2018-05-22 NOTE — Patient Instructions (Signed)

## 2018-05-22 NOTE — Progress Notes (Signed)
Ok to treat with pulse 110 per Dr. Maylon Peppers. Will add 1 liter normal saline to todays treatment per Dr. Maylon Peppers.

## 2018-05-22 NOTE — Progress Notes (Signed)
Patient in treatment today. Went to check in on him and he had concerns about some bleeding from his rectum and nose. He states he's had small amounts of pink blood in tissue after a BM. No blood in the bowl. No blood on his underwear. He denies constipation. His nose has had some blood when blowing, but no nose bleeds.  Reviewed symptoms with Dr Maylon Peppers. He would like patient to use some preporation H externally only to help with irritation of the rectum.  Patient aware of instructions. He also knows to call the office if he has any increased bleeding.

## 2018-05-25 ENCOUNTER — Ambulatory Visit
Admission: RE | Admit: 2018-05-25 | Discharge: 2018-05-25 | Disposition: A | Discharge status: Home / Self Care | Payer: BLUE CROSS/BLUE SHIELD | Source: Ambulatory Visit | Attending: Radiation Oncology | Admitting: Radiation Oncology

## 2018-05-25 DIAGNOSIS — Z5111 Encounter for antineoplastic chemotherapy: Secondary | ICD-10-CM | POA: Diagnosis not present

## 2018-05-25 DIAGNOSIS — D6481 Anemia due to antineoplastic chemotherapy: Secondary | ICD-10-CM | POA: Diagnosis not present

## 2018-05-25 DIAGNOSIS — C3431 Malignant neoplasm of lower lobe, right bronchus or lung: Secondary | ICD-10-CM | POA: Diagnosis not present

## 2018-05-25 DIAGNOSIS — Z87891 Personal history of nicotine dependence: Secondary | ICD-10-CM | POA: Insufficient documentation

## 2018-05-25 DIAGNOSIS — T451X5A Adverse effect of antineoplastic and immunosuppressive drugs, initial encounter: Secondary | ICD-10-CM | POA: Diagnosis not present

## 2018-05-25 DIAGNOSIS — Z510 Encounter for antineoplastic radiation therapy: Secondary | ICD-10-CM | POA: Insufficient documentation

## 2018-05-25 DIAGNOSIS — D701 Agranulocytosis secondary to cancer chemotherapy: Secondary | ICD-10-CM | POA: Diagnosis not present

## 2018-05-26 ENCOUNTER — Ambulatory Visit
Admission: RE | Admit: 2018-05-26 | Discharge: 2018-05-26 | Disposition: A | Discharge status: Home / Self Care | Payer: BLUE CROSS/BLUE SHIELD | Source: Ambulatory Visit | Attending: Radiation Oncology | Admitting: Radiation Oncology

## 2018-05-26 DIAGNOSIS — Z5111 Encounter for antineoplastic chemotherapy: Secondary | ICD-10-CM | POA: Diagnosis not present

## 2018-05-27 ENCOUNTER — Ambulatory Visit
Admission: RE | Admit: 2018-05-27 | Discharge: 2018-05-27 | Disposition: A | Discharge status: Home / Self Care | Payer: BLUE CROSS/BLUE SHIELD | Source: Ambulatory Visit | Attending: Radiation Oncology | Admitting: Radiation Oncology

## 2018-05-27 DIAGNOSIS — Z5111 Encounter for antineoplastic chemotherapy: Secondary | ICD-10-CM | POA: Diagnosis not present

## 2018-05-28 ENCOUNTER — Ambulatory Visit
Admission: RE | Admit: 2018-05-28 | Discharge: 2018-05-28 | Disposition: A | Discharge status: Home / Self Care | Payer: BLUE CROSS/BLUE SHIELD | Source: Ambulatory Visit | Attending: Radiation Oncology | Admitting: Radiation Oncology

## 2018-05-28 DIAGNOSIS — Z5111 Encounter for antineoplastic chemotherapy: Secondary | ICD-10-CM | POA: Diagnosis not present

## 2018-05-29 ENCOUNTER — Inpatient Hospital Stay: Payer: BLUE CROSS/BLUE SHIELD

## 2018-05-29 ENCOUNTER — Ambulatory Visit
Admission: RE | Admit: 2018-05-29 | Discharge: 2018-05-29 | Disposition: A | Discharge status: Home / Self Care | Payer: BLUE CROSS/BLUE SHIELD | Source: Ambulatory Visit | Attending: Radiation Oncology | Admitting: Radiation Oncology

## 2018-05-29 ENCOUNTER — Telehealth: Payer: Self-pay | Admitting: Hematology

## 2018-05-29 ENCOUNTER — Encounter: Payer: Self-pay | Admitting: Hematology

## 2018-05-29 ENCOUNTER — Inpatient Hospital Stay (HOSPITAL_BASED_OUTPATIENT_CLINIC_OR_DEPARTMENT_OTHER): Payer: BLUE CROSS/BLUE SHIELD | Admitting: Hematology

## 2018-05-29 DIAGNOSIS — Z79899 Other long term (current) drug therapy: Secondary | ICD-10-CM | POA: Insufficient documentation

## 2018-05-29 DIAGNOSIS — G629 Polyneuropathy, unspecified: Secondary | ICD-10-CM | POA: Insufficient documentation

## 2018-05-29 DIAGNOSIS — C3431 Malignant neoplasm of lower lobe, right bronchus or lung: Secondary | ICD-10-CM | POA: Diagnosis not present

## 2018-05-29 DIAGNOSIS — K209 Esophagitis, unspecified without bleeding: Secondary | ICD-10-CM

## 2018-05-29 DIAGNOSIS — E1142 Type 2 diabetes mellitus with diabetic polyneuropathy: Secondary | ICD-10-CM

## 2018-05-29 DIAGNOSIS — Z7984 Long term (current) use of oral hypoglycemic drugs: Secondary | ICD-10-CM

## 2018-05-29 DIAGNOSIS — Z794 Long term (current) use of insulin: Secondary | ICD-10-CM | POA: Insufficient documentation

## 2018-05-29 DIAGNOSIS — E1165 Type 2 diabetes mellitus with hyperglycemia: Secondary | ICD-10-CM | POA: Insufficient documentation

## 2018-05-29 DIAGNOSIS — K208 Other esophagitis without bleeding: Secondary | ICD-10-CM | POA: Insufficient documentation

## 2018-05-29 DIAGNOSIS — Z923 Personal history of irradiation: Secondary | ICD-10-CM | POA: Insufficient documentation

## 2018-05-29 DIAGNOSIS — T66XXXA Radiation sickness, unspecified, initial encounter: Secondary | ICD-10-CM | POA: Insufficient documentation

## 2018-05-29 DIAGNOSIS — D61811 Other drug-induced pancytopenia: Secondary | ICD-10-CM

## 2018-05-29 DIAGNOSIS — Z791 Long term (current) use of non-steroidal anti-inflammatories (NSAID): Secondary | ICD-10-CM

## 2018-05-29 DIAGNOSIS — Z87891 Personal history of nicotine dependence: Secondary | ICD-10-CM | POA: Insufficient documentation

## 2018-05-29 DIAGNOSIS — Z5111 Encounter for antineoplastic chemotherapy: Secondary | ICD-10-CM

## 2018-05-29 DIAGNOSIS — T451X5D Adverse effect of antineoplastic and immunosuppressive drugs, subsequent encounter: Secondary | ICD-10-CM | POA: Insufficient documentation

## 2018-05-29 DIAGNOSIS — D649 Anemia, unspecified: Secondary | ICD-10-CM

## 2018-05-29 LAB — CMP (CANCER CENTER ONLY)
ALT: 31 U/L (ref 0–44)
AST: 14 U/L — ABNORMAL LOW (ref 15–41)
Albumin: 3.9 g/dL (ref 3.5–5.0)
Alkaline Phosphatase: 82 U/L (ref 38–126)
Anion gap: 8 (ref 5–15)
BUN: 9 mg/dL (ref 6–20)
CO2: 26 mmol/L (ref 22–32)
CREATININE: 0.92 mg/dL (ref 0.61–1.24)
Calcium: 9.1 mg/dL (ref 8.9–10.3)
Chloride: 105 mmol/L (ref 98–111)
GFR, Est AFR Am: >60 mL/min (ref >60)
GFR, Estimated: >60 mL/min (ref >60)
Glucose, Bld: 309 mg/dL — ABNORMAL HIGH (ref 70–99)
Potassium: 4.2 mmol/L (ref 3.5–5.1)
Sodium: 139 mmol/L (ref 135–145)
TOTAL PROTEIN: 6.2 g/dL — AB (ref 6.5–8.1)
Total Bilirubin: 0.4 mg/dL (ref 0.3–1.2)

## 2018-05-29 LAB — CBC WITH DIFFERENTIAL (CANCER CENTER ONLY)
ABS IMMATURE GRANULOCYTES: 0.05 10*3/uL (ref 0.00–0.07)
Basophils Absolute: 0 10*3/uL (ref 0.0–0.1)
Basophils Relative: 0 %
Eosinophils Absolute: 0 10*3/uL (ref 0.0–0.5)
Eosinophils Relative: 1 %
HCT: 31.1 % — ABNORMAL LOW (ref 39.0–52.0)
Hemoglobin: 10.3 g/dL — ABNORMAL LOW (ref 13.0–17.0)
Immature Granulocytes: 1 %
Lymphocytes Relative: 15 %
Lymphs Abs: 0.7 10*3/uL (ref 0.7–4.0)
MCH: 31.3 pg (ref 26.0–34.0)
MCHC: 33.1 g/dL (ref 30.0–36.0)
MCV: 94.5 fL (ref 80.0–100.0)
Monocytes Absolute: 0.4 10*3/uL (ref 0.1–1.0)
Monocytes Relative: 8 %
Neutro Abs: 3.8 10*3/uL (ref 1.7–7.7)
Neutrophils Relative %: 75 %
Platelet Count: 178 10*3/uL (ref 150–400)
RBC: 3.29 MIL/uL — ABNORMAL LOW (ref 4.22–5.81)
RDW: 12.3 % (ref 11.5–15.5)
WBC Count: 5 10*3/uL (ref 4.0–10.5)
nRBC: 0 % (ref 0.0–0.2)

## 2018-05-29 MED ORDER — PALONOSETRON HCL INJECTION 0.25 MG/5ML
INTRAVENOUS | Status: AC
  Filled 2018-05-29: qty 5

## 2018-05-29 MED ORDER — DIPHENHYDRAMINE HCL 50 MG/ML IJ SOLN
50.0000 mg | Freq: Once | INTRAMUSCULAR | Status: AC
  Administered 2018-05-29: 50 mg via INTRAVENOUS

## 2018-05-29 MED ORDER — SODIUM CHLORIDE 0.9 % IV SOLN
20.0000 mg | Freq: Once | INTRAVENOUS | Status: DC
  Filled 2018-05-29: qty 2

## 2018-05-29 MED ORDER — SODIUM CHLORIDE 0.9 % IV SOLN
233.8000 mg | Freq: Once | INTRAVENOUS | Status: AC
  Administered 2018-05-29: 230 mg via INTRAVENOUS
  Filled 2018-05-29: qty 23

## 2018-05-29 MED ORDER — SODIUM CHLORIDE 0.9 % IV SOLN
45.0000 mg/m2 | Freq: Once | INTRAVENOUS | Status: AC
  Administered 2018-05-29: 90 mg via INTRAVENOUS
  Filled 2018-05-29: qty 15

## 2018-05-29 MED ORDER — SODIUM CHLORIDE 0.9% FLUSH
10.0000 mL | INTRAVENOUS | Status: DC | PRN
  Administered 2018-05-29: 10 mL
  Filled 2018-05-29: qty 10

## 2018-05-29 MED ORDER — SODIUM CHLORIDE 0.9 % IV SOLN
Freq: Once | INTRAVENOUS | Status: AC
  Administered 2018-05-29: 12:00:00 via INTRAVENOUS
  Filled 2018-05-29: qty 250

## 2018-05-29 MED ORDER — FAMOTIDINE IN NACL 20-0.9 MG/50ML-% IV SOLN
INTRAVENOUS | Status: AC
  Filled 2018-05-29: qty 50

## 2018-05-29 MED ORDER — FAMOTIDINE IN NACL 20-0.9 MG/50ML-% IV SOLN
20.0000 mg | Freq: Once | INTRAVENOUS | Status: AC
  Administered 2018-05-29: 20 mg via INTRAVENOUS

## 2018-05-29 MED ORDER — LIDOCAINE VISCOUS HCL 2 % MT SOLN: OROMUCOSAL | 5 refills | Status: DC

## 2018-05-29 MED ORDER — PALONOSETRON HCL INJECTION 0.25 MG/5ML
0.2500 mg | Freq: Once | INTRAVENOUS | Status: AC
  Administered 2018-05-29: 0.25 mg via INTRAVENOUS

## 2018-05-29 MED ORDER — DEXAMETHASONE SODIUM PHOSPHATE 10 MG/ML IJ SOLN
10.0000 mg | Freq: Once | INTRAMUSCULAR | Status: AC
  Administered 2018-05-29: 10 mg via INTRAVENOUS

## 2018-05-29 MED ORDER — DIPHENHYDRAMINE HCL 50 MG/ML IJ SOLN
INTRAMUSCULAR | Status: AC
  Filled 2018-05-29: qty 1

## 2018-05-29 MED ORDER — DEXAMETHASONE SODIUM PHOSPHATE 10 MG/ML IJ SOLN
INTRAMUSCULAR | Status: AC
  Filled 2018-05-29: qty 1

## 2018-05-29 MED ORDER — HEPARIN SOD (PORK) LOCK FLUSH 100 UNIT/ML IV SOLN
500.0000 [IU] | Freq: Once | INTRAVENOUS | Status: AC | PRN
  Administered 2018-05-29: 500 [IU]
  Filled 2018-05-29: qty 5

## 2018-05-29 NOTE — Progress Notes (Signed)
[place] OFFICE PROGRESS NOTE  Patient Care Team: Patient, No Pcp Per as PCP - General (General Practice) Cordelia Poche, RN as Oncology Nurse Navigator Tish Men, MD as Medical Oncologist (Hematology)  HEME/ONC OVERVIEW: 1. Stage IIB (cT3N0M0) adenocarcinoma of the right lung  -03/2018: CT CAP showed a 4.2 x 3.3 x 3.1cm RLL mass extending to RUL, additional small noncalcified nodules throughout the right lung, concerning for lung metastases; no metastatic disease in the abdomen/pelvis   -PET showed FDG-avid RLL mass 5.3 x 3.6cm and a satellite nodule in RLL; borderline FDG-avid (SUV 2.6) paratracheal LN   -MRI brain negative for mets   -EBUS w/ bx of the RLL mass showed poorly differentiated adenoCa with features of squamous cell differentiation; 4R LN negative for malignancy -04/2018 - present: definitive chemoradiation with weekly carbo/Taxol  2. Port placed in 04/2018  TREATMENT REGIMEN:  05/08/2018 - present: weekly carbo/Taxol concurrent with radiation; plan for 7 cycles   PERTINENT NON-HEM/ONC PROBLEMS: 1. Hx of tobacco use, quit in early 03/2018  ASSESSMENT & PLAN:   Stage IIB (cT3N0M0) adenocarcinoma of the right lung  -S/p 3 cycles of weekly carbo/Taxol so far; patient is tolerating treatment relatively well -Labs appropriate today, proceed with Cycle 4 of carbo/Taxol today -Plan for 7 cycles  -Toxicity check q2cycles of chemotherapy -PRN medications: Zofran, Compazine, Ativan  Normocytic anemia -Secondary to chemotherapy -Hgb 10.3, stable -Patient denies any symptoms of bleeding -We will monitor for now; no indication for dose adjustment  Peripheral neuropathy -Baseline neuropathy affecting the fingertips of the right hand and toes in bilateral feet secondary to DM II -Patient understands the risk of worsening neuropathy with chemotherapy -He reports mild persistent numbness in the 1st to 3rd digits in the right hand over the past week; he has hx  of trauma to the right hand with residual neuropathy prior to starting chemo -Other neuropathy symptoms in the lower extremities improved since starting gabapentin -I instructed the patient to increase gabapentin to 600mg  BID -If the numbness in the right hand worsens or he develops worsening neuropathy in other extremities, he is instructed to call the clinic for further evaluation   Radiation-associated esophagitis -Patient reports intermittent pain with swallowing in the epigastric area, likely due to radiation-associated esophagitis -I have prescribed viscous lidocaine swish and swallow -Pain relatively well controlled with PRN tramadol -I instructed the patient to call us if his pain worsens and we can adjust his pain regimen as needed   Poorly controlled DM -Glucose 309 -On metformin only  -I discussed on several occasions with patient regarding optimizing glucose control -He missed his appt with PCP last week but will reschedule his appt to be next week  No orders of the defined types were placed in this encounter.  All questions were answered. The patient knows to call the clinic with any problems, questions or concerns. No barriers to learning was detected.  Return in 2 weeks for labs, port flush and clinic appt.   Tish Men, MD 05/29/2018 1:21 PM  CHIEF COMPLAINT: "I am doing alright"  INTERVAL HISTORY: Mr. Lubrano return to clinic for follow-up of Stage II lung cancer on definitive chemoradiation.  He reports that he has had mild sore throat with swallowing as well as epigastric pain with eating, for which he has been taking PRN tramadol with relatively adequate pain control.  He has a history of trauma to the right thumb with residual numbness prior to starting chemotherapy, and over the past week,  he has developed mild persistent numbness in the fingertips of the first through third digits in the right hand.  It is not interfering with his ADLs or associated with any pain.   Since starting gabapentin 300 mg twice a day last week, the symptoms of neuropathy in the bilateral lower extremities have resolved.  He otherwise denies any other complaint today.  SUMMARY OF ONCOLOGIC HISTORY:   Lung cancer ([place])   03/31/2018 Imaging    CT CAP with contrast: IMPRESSION: 1. 4.2 x 3.3 x 3.1 cm irregularly marginated mass within the superior segment of the right lower lobe appearing to extend across the fissure into the posterior inferior right upper lobe and extending toward the right hilum where the right upper lobe superior segment bronchus is occluded, consistent with primary lung carcinoma. 2. Small noncalcified nodules more numerous throughout the right lung most consistent with lung metastases. 3. No metastatic involvement of the abdomen or pelvis by CT. 4. Moderate thoracic and abdominal aortic atherosclerosis.    04/03/2018 Initial Diagnosis    Lung cancer ([place])    05/08/2018 -  Chemotherapy    The patient had palonosetron (ALOXI) injection 0.25 mg, 0.25 mg, Intravenous,  Once, 4 of 7 cycles Administration: 0.25 mg (05/08/2018), 0.25 mg (05/15/2018), 0.25 mg (05/22/2018), 0.25 mg (05/29/2018) CARBOplatin (PARAPLATIN) 200 mg in sodium chloride 0.9 % 250 mL chemo infusion, 200 mg (92.5 % of original dose 215.8 mg), Intravenous,  Once, 4 of 7 cycles Dose modification:   (original dose 215.8 mg, Cycle 1) Administration: 200 mg (05/08/2018), 210 mg (05/15/2018), 180 mg (05/22/2018) PACLitaxel (TAXOL) 90 mg in sodium chloride 0.9 % 250 mL chemo infusion (</= 80mg /m2), 45 mg/m2 = 90 mg, Intravenous,  Once, 4 of 7 cycles Administration: 90 mg (05/08/2018), 90 mg (05/15/2018), 90 mg (05/22/2018)  for chemotherapy treatment.      REVIEW OF SYSTEMS:   Constitutional: ( - ) fevers, ( - )  chills , ( - ) night sweats Eyes: ( - ) blurriness of vision, ( - ) double vision, ( - ) watery eyes Ears, nose, mouth, throat, and face: ( - ) mucositis, ( - ) sore throat Respiratory: ( - )  cough, ( - ) dyspnea, ( - ) wheezes Cardiovascular: ( - ) palpitation, ( - ) chest discomfort, ( - ) lower extremity swelling Gastrointestinal:  ( - ) nausea, ( + ) heartburn, ( - ) change in bowel habits Skin: ( - ) abnormal skin rashes Lymphatics: ( - ) new lymphadenopathy, ( - ) easy bruising Neurological: ( + ) numbness, ( - ) tingling, ( - ) new weaknesses Behavioral/Psych: ( - ) mood change, ( - ) new changes  All other systems were reviewed with the patient and are negative.  I have reviewed the past medical history, past surgical history, social history and family history with the patient and they are unchanged from previous note.  ALLERGIES:  has No Known Allergies.  MEDICATIONS:  Current Outpatient Medications  Medication Sig Dispense Refill  . amitriptyline (ELAVIL) 25 MG tablet Take 50 mg by mouth at bedtime.    Marland Kitchen amLODipine (NORVASC) 5 MG tablet Take 5 mg by mouth daily.    Marland Kitchen dexamethasone (DECADRON) 4 MG tablet Take 2 tablets (8 mg total) by mouth daily. Start the day after chemotherapy for 2 days. 30 tablet 1  . diclofenac (VOLTAREN) 75 MG EC tablet Take 75 mg by mouth 2 (two) times daily.    Marland Kitchen gabapentin (NEURONTIN) 300 MG  capsule Take 1 capsule (300 mg total) by mouth 2 (two) times daily for 30 days. 60 capsule 3  . lidocaine (XYLOCAINE) 2 % solution Patient: Mix 1part 2% viscous lidocaine, 1part H20. Swish & swallow 66mL of diluted mixture, 28min before meals and at bedtime, up to QID 100 mL 5  . lidocaine-prilocaine (EMLA) cream Apply to affected area once 30 g 3  . LORazepam (ATIVAN) 0.5 MG tablet Take 1 tablet (0.5 mg total) by mouth every 6 (six) hours as needed (Nausea or vomiting). 30 tablet 0  . metFORMIN (GLUCOPHAGE-XR) 500 MG 24 hr tablet Take 1,000 mg by mouth 2 (two) times daily.    . mometasone (ELOCON) 0.1 % cream Apply thin layer to affected areas bid prn    . ondansetron (ZOFRAN) 8 MG tablet Take 1 tablet (8 mg total) by mouth 2 (two) times daily as needed  for refractory nausea / vomiting. Start on day 3 after chemo. 30 tablet 1  . prochlorperazine (COMPAZINE) 10 MG tablet Take 1 tablet (10 mg total) by mouth every 6 (six) hours as needed (Nausea or vomiting). 30 tablet 1  . traMADol (ULTRAM) 50 MG tablet Take 1 tablet (50 mg total) by mouth 2 (two) times daily for 30 days. 60 tablet 2   No current facility-administered medications for this visit.    Facility-Administered Medications Ordered in Other Visits  Medication Dose Route Frequency Provider Last Rate Last Dose  . CARBOplatin (PARAPLATIN) 230 mg in sodium chloride 0.9 % 100 mL chemo infusion  230 mg Intravenous Once Tish Men, MD      . heparin lock flush 100 unit/mL  500 Units Intracatheter Once PRN Tish Men, MD      . PACLitaxel (TAXOL) 90 mg in sodium chloride 0.9 % 250 mL chemo infusion (</= 80mg /m2)  45 mg/m2 (Treatment Plan Recorded) Intravenous Once Tish Men, MD 265 mL/hr at 05/29/18 1310 90 mg at 05/29/18 1310  . sodium chloride flush (NS) 0.9 % injection 10 mL  10 mL Intracatheter PRN Tish Men, MD        PHYSICAL EXAMINATION: ECOG PERFORMANCE STATUS: 1 - Symptomatic but completely ambulatory  There were no vitals filed for this visit. There is no height or weight on file to calculate BMI.  There were no vitals filed for this visit.  Wt Readings from Last 3 Encounters:  05/15/18 165 lb (74.8 kg)  04/30/18 162 lb 9.6 oz (73.8 kg)  04/24/18 167 lb 12.8 oz (76.1 kg)   Temp Readings from Last 3 Encounters:  05/22/18 97.7 F (36.5 C) (Oral)  05/08/18 97.8 F (36.6 C) (Oral)  05/05/18 (!) 97.5 F (36.4 C) (Axillary)   BP Readings from Last 3 Encounters:  05/22/18 125/75  05/15/18 (!) 146/86  05/08/18 (!) 162/88   Pulse Readings from Last 3 Encounters:  05/22/18 (!) 110  05/15/18 (!) 111  05/08/18 (!) 102   GENERAL: alert, no distress and comfortable SKIN: skin color, texture, turgor are normal, no rashes or significant lesions EYES: conjunctiva are pink and  non-injected, sclera clear OROPHARYNX: no exudate, no erythema; lips, buccal mucosa, and tongue normal  NECK: supple, non-tender LYMPH:  no palpable lymphadenopathy in the cervical LUNGS: clear to auscultation with normal breathing effort HEART: regular rate & rhythm and no murmurs and no lower extremity edema ABDOMEN: soft, non-tender, non-distended, normal bowel sounds Musculoskeletal: no cyanosis of digits and no clubbing  PSYCH: alert & oriented x 3, fluent speech NEURO: no focal motor/sensory deficits  LABORATORY DATA:  I have reviewed the data as listed    Component Value Date/Time   NA 139 05/29/2018 1100   K 4.2 05/29/2018 1100   CL 105 05/29/2018 1100   CO2 26 05/29/2018 1100   GLUCOSE 309 (H) 05/29/2018 1100   BUN 9 05/29/2018 1100   CREATININE 0.92 05/29/2018 1100   CALCIUM 9.1 05/29/2018 1100   PROT 6.2 (L) 05/29/2018 1100   ALBUMIN 3.9 05/29/2018 1100   AST 14 (L) 05/29/2018 1100   ALT 31 05/29/2018 1100   ALKPHOS 82 05/29/2018 1100   BILITOT 0.4 05/29/2018 1100   GFRNONAA >60 05/29/2018 1100   GFRAA >60 05/29/2018 1100    No results found for: SPEP, UPEP  Lab Results  Component Value Date   WBC 5.0 05/29/2018   NEUTROABS 3.8 05/29/2018   HGB 10.3 (L) 05/29/2018   HCT 31.1 (L) 05/29/2018   MCV 94.5 05/29/2018   PLT 178 05/29/2018      Chemistry      Component Value Date/Time   NA 139 05/29/2018 1100   K 4.2 05/29/2018 1100   CL 105 05/29/2018 1100   CO2 26 05/29/2018 1100   BUN 9 05/29/2018 1100   CREATININE 0.92 05/29/2018 1100      Component Value Date/Time   CALCIUM 9.1 05/29/2018 1100   ALKPHOS 82 05/29/2018 1100   AST 14 (L) 05/29/2018 1100   ALT 31 05/29/2018 1100   BILITOT 0.4 05/29/2018 1100       RADIOGRAPHIC STUDIES: I have personally reviewed the radiological images as listed below and agreed with the findings in the report. 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 EXAM: IMPLANTED PORT A CATH PLACEMENT WITH ULTRASOUND AND FLUOROSCOPIC GUIDANCE ANESTHESIA/SEDATION: 2.0 mg IV Versed; 100 mcg IV Fentanyl Total Moderate Sedation Time:  39 minutes The patient's level of consciousness and physiologic status were continuously monitored during the procedure by Radiology nursing. Additional Medications: 2 g IV Ancef. FLUOROSCOPY TIME:  30 seconds.  4.7 mGy. PROCEDURE: The procedure, risks, benefits, and alternatives were explained to the patient. Questions regarding the procedure were encouraged and answered. The patient understands and consents to the procedure. A time-out was performed prior to initiating the procedure. Ultrasound was utilized to confirm patency of the left internal jugular vein. The left neck and chest were prepped with chlorhexidine in a sterile fashion, and a sterile drape was applied covering the operative field. Maximum barrier sterile technique with sterile gowns and gloves were used for the procedure. Local anesthesia was provided with 1% lidocaine. After creating a small venotomy incision, a 21 gauge needle was advanced into the left internal jugular vein under direct, real-time ultrasound guidance. Ultrasound image documentation was performed. After securing guidewire access, an 8 Fr dilator was placed. A J-wire was kinked to measure appropriate catheter length. A subcutaneous port pocket was then created along the upper chest wall utilizing sharp and blunt dissection. Portable cautery was utilized. The pocket was irrigated with sterile saline. A single lumen power injectable port was chosen for placement. The 8 Fr catheter was tunneled from the port pocket site to the venotomy incision. The port was placed in the pocket. External catheter was trimmed to appropriate length based on guidewire measurement. At the venotomy, an 8 Fr peel-away sheath was placed over a guidewire. The catheter was then  placed through the sheath and the sheath removed. Final catheter  positioning was confirmed and documented with a fluoroscopic spot image. The port was accessed with a needle and aspirated and flushed with heparinized saline. The access needle was removed. The venotomy and port pocket incisions were closed with subcutaneous 3-0 Monocryl and subcuticular 4-0 Vicryl. Dermabond was applied to both incisions. COMPLICATIONS: COMPLICATIONS None FINDINGS: After catheter placement, the tip lies at the cavo-atrial junction. The catheter aspirates normally and is ready for immediate use. IMPRESSION: Placement of single lumen port a cath via left internal jugular vein. The catheter tip lies at the cavo-atrial junction. A power injectable port a cath was placed and is ready for immediate use. Electronically Signed   By: Aletta Edouard M.D.   On: 05/05/2018 15:42

## 2018-05-29 NOTE — Progress Notes (Signed)
Okay to treat with HR = 110 and CBG = 309 per Dr. Maylon Peppers.

## 2018-05-29 NOTE — Patient Instructions (Signed)
Carboplatin injection What is this medicine? CARBOPLATIN (KAR boe pla tin) is a chemotherapy drug. It targets fast dividing cells, like cancer cells, and causes these cells to die. This medicine is used to treat ovarian cancer and many other cancers. This medicine may be used for other purposes; ask your health care provider or pharmacist if you have questions. COMMON BRAND NAME(S): Paraplatin What should I tell my health care provider before I take this medicine? They need to know if you have any of these conditions: -blood disorders -hearing problems -kidney disease -recent or ongoing radiation therapy -an unusual or allergic reaction to carboplatin, cisplatin, other chemotherapy, other medicines, foods, dyes, or preservatives -pregnant or trying to get pregnant -breast-feeding How should I use this medicine? This drug is usually given as an infusion into a vein. It is administered in a hospital or clinic by a specially trained health care professional. Talk to your pediatrician regarding the use of this medicine in children. Special care may be needed. Overdosage: If you think you have taken too much of this medicine contact a poison control center or emergency room at once. NOTE: This medicine is only for you. Do not share this medicine with others. What if I miss a dose? It is important not to miss a dose. Call your doctor or health care professional if you are unable to keep an appointment. What may interact with this medicine? -medicines for seizures -medicines to increase blood counts like filgrastim, pegfilgrastim, sargramostim -some antibiotics like amikacin, gentamicin, neomycin, streptomycin, tobramycin -vaccines Talk to your doctor or health care professional before taking any of these medicines: -acetaminophen -aspirin -ibuprofen -ketoprofen -naproxen This list may not describe all possible interactions. Give your health care provider a list of all the medicines, herbs,  non-prescription drugs, or dietary supplements you use. Also tell them if you smoke, drink alcohol, or use illegal drugs. Some items may interact with your medicine. What should I watch for while using this medicine? Your condition will be monitored carefully while you are receiving this medicine. You will need important blood work done while you are taking this medicine. This drug may make you feel generally unwell. This is not uncommon, as chemotherapy can affect healthy cells as well as cancer cells. Report any side effects. Continue your course of treatment even though you feel ill unless your doctor tells you to stop. In some cases, you may be given additional medicines to help with side effects. Follow all directions for their use. Call your doctor or health care professional for advice if you get a fever, chills or sore throat, or other symptoms of a cold or flu. Do not treat yourself. This drug decreases your body's ability to fight infections. Try to avoid being around people who are sick. This medicine may increase your risk to bruise or bleed. Call your doctor or health care professional if you notice any unusual bleeding. Be careful brushing and flossing your teeth or using a toothpick because you may get an infection or bleed more easily. If you have any dental work done, tell your dentist you are receiving this medicine. Avoid taking products that contain aspirin, acetaminophen, ibuprofen, naproxen, or ketoprofen unless instructed by your doctor. These medicines may hide a fever. Do not become pregnant while taking this medicine. Women should inform their doctor if they wish to become pregnant or think they might be pregnant. There is a potential for serious side effects to an unborn child. Talk to your health care professional or  pharmacist for more information. Do not breast-feed an infant while taking this medicine. What side effects may I notice from receiving this medicine? Side effects  that you should report to your doctor or health care professional as soon as possible: -allergic reactions like skin rash, itching or hives, swelling of the face, lips, or tongue -signs of infection - fever or chills, cough, sore throat, pain or difficulty passing urine -signs of decreased platelets or bleeding - bruising, pinpoint red spots on the skin, black, tarry stools, nosebleeds -signs of decreased red blood cells - unusually weak or tired, fainting spells, lightheadedness -breathing problems -changes in hearing -changes in vision -chest pain -high blood pressure -low blood counts - This drug may decrease the number of white blood cells, red blood cells and platelets. You may be at increased risk for infections and bleeding. -nausea and vomiting -pain, swelling, redness or irritation at the injection site -pain, tingling, numbness in the hands or feet -problems with balance, talking, walking -trouble passing urine or change in the amount of urine Side effects that usually do not require medical attention (report to your doctor or health care professional if they continue or are bothersome): -hair loss -loss of appetite -metallic taste in the mouth or changes in taste This list may not describe all possible side effects. Call your doctor for medical advice about side effects. You may report side effects to FDA at 1-800-FDA-1088. Where should I keep my medicine? This drug is given in a hospital or clinic and will not be stored at home. NOTE: This sheet is a summary. It may not cover all possible information. If you have questions about this medicine, talk to your doctor, pharmacist, or health care provider.  2019 [employer] Standard (2007-06-16 14:38:05) Docetaxel injection What is this medicine? DOCETAXEL (doe se TAX el) is a chemotherapy drug. It targets fast dividing cells, like cancer cells, and causes these cells to die. This medicine is used to treat many types of cancers  like breast cancer, certain stomach cancers, head and neck cancer, lung cancer, and prostate cancer. This medicine may be used for other purposes; ask your health care provider or pharmacist if you have questions. COMMON BRAND NAME(S): Docefrez, Taxotere What should I tell my health care provider before I take this medicine? They need to know if you have any of these conditions: -infection (especially a virus infection such as chickenpox, cold sores, or herpes) -liver disease -low blood counts, like low white cell, platelet, or red cell counts -an unusual or allergic reaction to docetaxel, polysorbate 80, other chemotherapy agents, other medicines, foods, dyes, or preservatives -pregnant or trying to get pregnant -breast-feeding How should I use this medicine? This drug is given as an infusion into a vein. It is administered in a hospital or clinic by a specially trained health care professional. Talk to your pediatrician regarding the use of this medicine in children. Special care may be needed. Overdosage: If you think you have taken too much of this medicine contact a poison control center or emergency room at once. NOTE: This medicine is only for you. Do not share this medicine with others. What if I miss a dose? It is important not to miss your dose. Call your doctor or health care professional if you are unable to keep an appointment. What may interact with this medicine? -cyclosporine -erythromycin -ketoconazole -medicines to increase blood counts like filgrastim, pegfilgrastim, sargramostim -vaccines Talk to your doctor or health care professional before taking any of these  medicines: -acetaminophen -aspirin -ibuprofen -ketoprofen -naproxen This list may not describe all possible interactions. Give your health care provider a list of all the medicines, herbs, non-prescription drugs, or dietary supplements you use. Also tell them if you smoke, drink alcohol, or use illegal drugs.  Some items may interact with your medicine. What should I watch for while using this medicine? Your condition will be monitored carefully while you are receiving this medicine. You will need important blood work done while you are taking this medicine. This drug may make you feel generally unwell. This is not uncommon, as chemotherapy can affect healthy cells as well as cancer cells. Report any side effects. Continue your course of treatment even though you feel ill unless your doctor tells you to stop. In some cases, you may be given additional medicines to help with side effects. Follow all directions for their use. Call your doctor or health care professional for advice if you get a fever, chills or sore throat, or other symptoms of a cold or flu. Do not treat yourself. This drug decreases your body's ability to fight infections. Try to avoid being around people who are sick. This medicine may increase your risk to bruise or bleed. Call your doctor or health care professional if you notice any unusual bleeding. This medicine may contain alcohol in the product. You may get drowsy or dizzy. Do not drive, use machinery, or do anything that needs mental alertness until you know how this medicine affects you. Do not stand or sit up quickly, especially if you are an older patient. This reduces the risk of dizzy or fainting spells. Avoid alcoholic drinks. Do not become pregnant while taking this medicine or for 6 months after stopping it. Women should inform their doctor if they wish to become pregnant or think they might be pregnant. Men should not father a child while taking this medicine and for 3 months after stopping it. There is a potential for serious side effects to an unborn child. Talk to your health care professional or pharmacist for more information. Do not breast-feed an infant while taking this medicine or for 2 weeks after stopping it. This may interfere with the ability to father a child. You  should talk to your doctor or health care professional if you are concerned about your fertility. What side effects may I notice from receiving this medicine? Side effects that you should report to your doctor or health care professional as soon as possible: -allergic reactions like skin rash, itching or hives, swelling of the face, lips, or tongue -low blood counts - This drug may decrease the number of white blood cells, red blood cells and platelets. You may be at increased risk for infections and bleeding. -signs of infection - fever or chills, cough, sore throat, pain or difficulty passing urine -signs of decreased platelets or bleeding - bruising, pinpoint red spots on the skin, black, tarry stools, nosebleeds -signs of decreased red blood cells - unusually weak or tired, fainting spells, lightheadedness -breathing problems -fast or irregular heartbeat -low blood pressure -mouth sores -nausea and vomiting -pain, swelling, redness or irritation at the injection site -pain, tingling, numbness in the hands or feet -swelling of the ankle, feet, hands -weight gain Side effects that usually do not require medical attention (report to your doctor or health care professional if they continue or are bothersome): -bone pain -complete hair loss including hair on your head, underarms, pubic hair, eyebrows, and eyelashes -diarrhea -excessive tearing -changes in the  color of fingernails -loosening of the fingernails -nausea -muscle pain -red flush to skin -sweating -weak or tired This list may not describe all possible side effects. Call your doctor for medical advice about side effects. You may report side effects to FDA at 1-800-FDA-1088. Where should I keep my medicine? This drug is given in a hospital or clinic and will not be stored at home. NOTE: This sheet is a summary. It may not cover all possible information. If you have questions about this medicine, talk to your doctor,  pharmacist, or health care provider.  2019 [employer] Standard (2017-04-07 12:07:21)

## 2018-05-29 NOTE — Telephone Encounter (Signed)
No change in appts per 3/6 los

## 2018-05-29 NOTE — Patient Instructions (Signed)

## 2018-06-01 ENCOUNTER — Ambulatory Visit
Admission: RE | Admit: 2018-06-01 | Discharge: 2018-06-01 | Disposition: A | Discharge status: Home / Self Care | Payer: BLUE CROSS/BLUE SHIELD | Source: Ambulatory Visit | Attending: Radiation Oncology | Admitting: Radiation Oncology

## 2018-06-01 DIAGNOSIS — Z5111 Encounter for antineoplastic chemotherapy: Secondary | ICD-10-CM | POA: Diagnosis not present

## 2018-06-02 ENCOUNTER — Ambulatory Visit
Admission: RE | Admit: 2018-06-02 | Discharge: 2018-06-02 | Disposition: A | Discharge status: Home / Self Care | Payer: BLUE CROSS/BLUE SHIELD | Source: Ambulatory Visit | Attending: Radiation Oncology | Admitting: Radiation Oncology

## 2018-06-02 DIAGNOSIS — Z5111 Encounter for antineoplastic chemotherapy: Secondary | ICD-10-CM | POA: Diagnosis not present

## 2018-06-03 ENCOUNTER — Ambulatory Visit
Admission: RE | Admit: 2018-06-03 | Discharge: 2018-06-03 | Disposition: A | Discharge status: Home / Self Care | Payer: BLUE CROSS/BLUE SHIELD | Source: Ambulatory Visit | Attending: Radiation Oncology | Admitting: Radiation Oncology

## 2018-06-03 ENCOUNTER — Other Ambulatory Visit: Payer: Self-pay

## 2018-06-03 DIAGNOSIS — Z5111 Encounter for antineoplastic chemotherapy: Secondary | ICD-10-CM | POA: Diagnosis not present

## 2018-06-04 ENCOUNTER — Ambulatory Visit
Admission: RE | Admit: 2018-06-04 | Discharge: 2018-06-04 | Disposition: A | Discharge status: Home / Self Care | Payer: BLUE CROSS/BLUE SHIELD | Source: Ambulatory Visit | Attending: Radiation Oncology | Admitting: Radiation Oncology

## 2018-06-04 ENCOUNTER — Other Ambulatory Visit: Payer: Self-pay

## 2018-06-04 DIAGNOSIS — Z5111 Encounter for antineoplastic chemotherapy: Secondary | ICD-10-CM | POA: Diagnosis not present

## 2018-06-05 ENCOUNTER — Inpatient Hospital Stay: Payer: BLUE CROSS/BLUE SHIELD

## 2018-06-05 ENCOUNTER — Encounter: Payer: Self-pay | Admitting: Hematology

## 2018-06-05 ENCOUNTER — Ambulatory Visit
Admission: RE | Admit: 2018-06-05 | Discharge: 2018-06-05 | Disposition: A | Discharge status: Home / Self Care | Payer: BLUE CROSS/BLUE SHIELD | Source: Ambulatory Visit | Attending: Radiation Oncology | Admitting: Radiation Oncology

## 2018-06-05 ENCOUNTER — Inpatient Hospital Stay (HOSPITAL_BASED_OUTPATIENT_CLINIC_OR_DEPARTMENT_OTHER): Payer: BLUE CROSS/BLUE SHIELD | Admitting: Hematology

## 2018-06-05 ENCOUNTER — Other Ambulatory Visit: Payer: Self-pay

## 2018-06-05 VITALS — BP 122/79 | HR 114 | Temp 98.1°F | Resp 18 | Ht 71.0 in | Wt 164.3 lb

## 2018-06-05 VITALS — BP 122/79 | HR 114 | Temp 98.1°F | Resp 18

## 2018-06-05 DIAGNOSIS — K208 Other esophagitis without bleeding: Secondary | ICD-10-CM

## 2018-06-05 DIAGNOSIS — Z923 Personal history of irradiation: Secondary | ICD-10-CM

## 2018-06-05 DIAGNOSIS — C3431 Malignant neoplasm of lower lobe, right bronchus or lung: Secondary | ICD-10-CM

## 2018-06-05 DIAGNOSIS — G629 Polyneuropathy, unspecified: Secondary | ICD-10-CM

## 2018-06-05 DIAGNOSIS — D6481 Anemia due to antineoplastic chemotherapy: Secondary | ICD-10-CM

## 2018-06-05 DIAGNOSIS — Z7984 Long term (current) use of oral hypoglycemic drugs: Secondary | ICD-10-CM

## 2018-06-05 DIAGNOSIS — Z5111 Encounter for antineoplastic chemotherapy: Secondary | ICD-10-CM | POA: Diagnosis not present

## 2018-06-05 DIAGNOSIS — D6959 Other secondary thrombocytopenia: Secondary | ICD-10-CM

## 2018-06-05 DIAGNOSIS — E1142 Type 2 diabetes mellitus with diabetic polyneuropathy: Secondary | ICD-10-CM

## 2018-06-05 DIAGNOSIS — E1165 Type 2 diabetes mellitus with hyperglycemia: Secondary | ICD-10-CM

## 2018-06-05 DIAGNOSIS — D61811 Other drug-induced pancytopenia: Secondary | ICD-10-CM

## 2018-06-05 DIAGNOSIS — T451X5A Adverse effect of antineoplastic and immunosuppressive drugs, initial encounter: Secondary | ICD-10-CM | POA: Insufficient documentation

## 2018-06-05 DIAGNOSIS — T66XXXA Radiation sickness, unspecified, initial encounter: Secondary | ICD-10-CM

## 2018-06-05 DIAGNOSIS — D701 Agranulocytosis secondary to cancer chemotherapy: Secondary | ICD-10-CM

## 2018-06-05 DIAGNOSIS — Z79899 Other long term (current) drug therapy: Secondary | ICD-10-CM

## 2018-06-05 LAB — CBC WITH DIFFERENTIAL (CANCER CENTER ONLY)
Abs Immature Granulocytes: 0.03 10*3/uL (ref 0.00–0.07)
Basophils Absolute: 0 10*3/uL (ref 0.0–0.1)
Basophils Relative: 1 %
EOS ABS: 0 10*3/uL (ref 0.0–0.5)
Eosinophils Relative: 1 %
HCT: 29.3 % — ABNORMAL LOW (ref 39.0–52.0)
Hemoglobin: 9.9 g/dL — ABNORMAL LOW (ref 13.0–17.0)
Immature Granulocytes: 1 %
Lymphocytes Relative: 18 %
Lymphs Abs: 0.7 10*3/uL (ref 0.7–4.0)
MCH: 31.5 pg (ref 26.0–34.0)
MCHC: 33.8 g/dL (ref 30.0–36.0)
MCV: 93.3 fL (ref 80.0–100.0)
Monocytes Absolute: 0.3 10*3/uL (ref 0.1–1.0)
Monocytes Relative: 7 %
Neutro Abs: 2.8 10*3/uL (ref 1.7–7.7)
Neutrophils Relative %: 72 %
Platelet Count: 131 10*3/uL — ABNORMAL LOW (ref 150–400)
RBC: 3.14 MIL/uL — ABNORMAL LOW (ref 4.22–5.81)
RDW: 12.6 % (ref 11.5–15.5)
WBC Count: 3.9 10*3/uL — ABNORMAL LOW (ref 4.0–10.5)
nRBC: 0 % (ref 0.0–0.2)

## 2018-06-05 LAB — CMP (CANCER CENTER ONLY)
ALT: 41 U/L (ref 0–44)
ANION GAP: 8 (ref 5–15)
AST: 15 U/L (ref 15–41)
Albumin: 4 g/dL (ref 3.5–5.0)
Alkaline Phosphatase: 103 U/L (ref 38–126)
BUN: 14 mg/dL (ref 8–23)
CO2: 27 mmol/L (ref 22–32)
Calcium: 9.2 mg/dL (ref 8.9–10.3)
Chloride: 100 mmol/L (ref 98–111)
Creatinine: 1.07 mg/dL (ref 0.61–1.24)
GFR, Est AFR Am: >60 mL/min (ref >60)
Glucose, Bld: 347 mg/dL — ABNORMAL HIGH (ref 70–99)
Potassium: 4.4 mmol/L (ref 3.5–5.1)
Sodium: 135 mmol/L (ref 135–145)
Total Bilirubin: 0.3 mg/dL (ref 0.3–1.2)
Total Protein: 6.2 g/dL — ABNORMAL LOW (ref 6.5–8.1)

## 2018-06-05 MED ORDER — PALONOSETRON HCL INJECTION 0.25 MG/5ML
INTRAVENOUS | Status: AC
  Filled 2018-06-05: qty 5

## 2018-06-05 MED ORDER — PALONOSETRON HCL INJECTION 0.25 MG/5ML
0.2500 mg | Freq: Once | INTRAVENOUS | Status: AC
  Administered 2018-06-05: 0.25 mg via INTRAVENOUS

## 2018-06-05 MED ORDER — SODIUM CHLORIDE 0.9 % IV SOLN
45.0000 mg/m2 | Freq: Once | INTRAVENOUS | Status: AC
  Administered 2018-06-05: 90 mg via INTRAVENOUS
  Filled 2018-06-05: qty 15

## 2018-06-05 MED ORDER — SODIUM CHLORIDE 0.9 % IV SOLN
Freq: Once | INTRAVENOUS | Status: AC
  Administered 2018-06-05: 12:00:00 via INTRAVENOUS
  Filled 2018-06-05: qty 250

## 2018-06-05 MED ORDER — DEXAMETHASONE SODIUM PHOSPHATE 10 MG/ML IJ SOLN
10.0000 mg | Freq: Once | INTRAMUSCULAR | Status: AC
  Administered 2018-06-05: 10 mg via INTRAVENOUS

## 2018-06-05 MED ORDER — SODIUM CHLORIDE 0.9% FLUSH
10.0000 mL | Freq: Once | INTRAVENOUS | Status: AC | PRN
  Administered 2018-06-05: 10 mL
  Filled 2018-06-05: qty 10

## 2018-06-05 MED ORDER — SODIUM CHLORIDE 0.9 % IV SOLN
206.0000 mg | Freq: Once | INTRAVENOUS | Status: AC
  Administered 2018-06-05: 210 mg via INTRAVENOUS
  Filled 2018-06-05: qty 21

## 2018-06-05 MED ORDER — HEPARIN SOD (PORK) LOCK FLUSH 100 UNIT/ML IV SOLN
500.0000 [IU] | Freq: Once | INTRAVENOUS | Status: AC | PRN
  Administered 2018-06-05: 500 [IU]
  Filled 2018-06-05: qty 5

## 2018-06-05 MED ORDER — DEXAMETHASONE SODIUM PHOSPHATE 10 MG/ML IJ SOLN
INTRAMUSCULAR | Status: AC
  Filled 2018-06-05: qty 1

## 2018-06-05 MED ORDER — SODIUM CHLORIDE 0.9 % IV SOLN
20.0000 mg | Freq: Once | INTRAVENOUS | Status: AC
  Administered 2018-06-05: 20 mg via INTRAVENOUS
  Filled 2018-06-05: qty 2

## 2018-06-05 MED ORDER — FAMOTIDINE IN NACL 20-0.9 MG/50ML-% IV SOLN: 20.0000 mg | Freq: Once | INTRAVENOUS | Status: DC

## 2018-06-05 MED ORDER — MORPHINE SULFATE 15 MG PO TABS: 15.0000 mg | ORAL_TABLET | Freq: Four times a day (QID) | ORAL | 0 refills | Status: AC | PRN

## 2018-06-05 MED ORDER — DIPHENHYDRAMINE HCL 50 MG/ML IJ SOLN
50.0000 mg | Freq: Once | INTRAMUSCULAR | Status: AC
  Administered 2018-06-05: 50 mg via INTRAVENOUS

## 2018-06-05 MED ORDER — DIPHENHYDRAMINE HCL 50 MG/ML IJ SOLN
INTRAMUSCULAR | Status: AC
  Filled 2018-06-05: qty 1

## 2018-06-05 MED ORDER — SODIUM CHLORIDE 0.9% FLUSH
10.0000 mL | INTRAVENOUS | Status: DC | PRN
  Administered 2018-06-05: 10 mL
  Filled 2018-06-05: qty 10

## 2018-06-05 MED ORDER — GABAPENTIN 300 MG PO CAPS: 600.0000 mg | ORAL_CAPSULE | Freq: Two times a day (BID) | ORAL | 5 refills | Status: DC

## 2018-06-05 MED ORDER — FAMOTIDINE IN NACL 20-0.9 MG/50ML-% IV SOLN
INTRAVENOUS | Status: AC
  Filled 2018-06-05: qty 50

## 2018-06-05 MED ORDER — HEPARIN SOD (PORK) LOCK FLUSH 100 UNIT/ML IV SOLN
500.0000 [IU] | Freq: Once | INTRAVENOUS | Status: DC | PRN
  Filled 2018-06-05: qty 5

## 2018-06-05 MED ORDER — INSULIN GLARGINE 100 UNIT/ML SOLOSTAR PEN: 8.0000 [IU] | PEN_INJECTOR | Freq: Every day | SUBCUTANEOUS | 0 refills | Status: DC

## 2018-06-05 MED FILL — ULTICARE PEN NDL 8MM 31G: 31G X 8 MM | 90 days supply | Qty: 100 | Fill #0

## 2018-06-05 MED FILL — LANTUS SOLOSTAR 100 UNITS/M: 100 | 38 days supply | Qty: 3 | Fill #0

## 2018-06-05 NOTE — Progress Notes (Signed)
[facility] OFFICE PROGRESS NOTE  Patient Care Team: Patient, No Pcp Per as PCP - General (General Practice) Cordelia Poche, RN as Oncology Nurse Navigator Tish Men, MD as Medical Oncologist (Hematology)  HEME/ONC OVERVIEW: 1. Stage IIIA [name]) adenocarcinoma of the right lung  -03/2018: CT CAP showed a 4.2 x 3.3 x 3.1cm RLL mass extending to RUL, additional small noncalcified nodules throughout the right lung; no metastatic disease in the abdomen/pelvis   PET showed FDG-avid RLL mass 5.3 x 3.6cm and a satellite nodule in RLL; borderline FDG-avid (SUV 2.6) paratracheal LN   MRI brain negative for mets   EBUS w/ bx of the RLL mass showed poorly differentiated adenoCa with features of squamous cell differentiation; 4R LN negative for malignancy -04/2018 - present: definitive chemoradiation with weekly carbo/Taxol  2. Port placed in 04/2018  TREATMENT REGIMEN:  05/08/2018 - present: weekly carbo/Taxol concurrent with radiation; plan for 7 cycles   PERTINENT NON-HEM/ONC PROBLEMS: 1. Hx of tobacco use, quit in early 03/2018  ASSESSMENT & PLAN:   Stage IIIA ([id number]) adenocarcinoma of the right lung  -S/p 4 cycles of weekly carbo/Taxol so far -Labs appropriate, proceed with Cycle 5 of weekly carbo/Taxol today; plan for 7 cycles -We will plan to obtain CT CAP 2 weeks after completion of chemoRT; if stable or improving disease, then we can consider maintenance durvalumab  -PRN medications: Zofran, Compazine, Ativan  Chemotherapy-associated anemia -Secondary to chemotherapy -Hgb 9.9, slightly lower than last week -Patient denies any symptom of bleeding -We will monitor for now; no indication for dose adjustment -If anemia worsens in the future, we will consider delaying chemotherapy or adjusting chemotherapy dose  Chemotherapy-associated leukopenia -Secondary to chemotherapy -WBC 3.9k with ANC 2800, stable -Patient denies any symptoms of infection -We will  monitor for now; no indication for dose adjustment -If leukopenia worsens in the future, we will consider delaying chemotherapy or adjusting chemotherapy dose  Chemotherapy-associated thrombocytopenia -Secondary to chemotherapy -Plts 131k, stable -Patient denies any symptoms of bleeding or excess bruising, such as epistaxis, hematochezia, melena, or hematuria -We will monitor for now; no indication for dose adjustment -If thrombocytopenia worsens in the future, we will consider delaying chemotherapy or adjusting chemotherapy dose  Peripheral neuropathy -Secondary to underlying DM, mildly exacerbated by chemotherapy -Symptoms overall improving on gabapentin -I have increased his gabapentin dose to 600mg  BID and sent the updated prescription to his pharmacy -I counseled the patient on monitoring his neuropathy, and if it becomes worse or interferes with his ADLs, he is instructed to call the clinic for further evaluation  Radiation-associated esophagitis -Secondary to radiation -Patient partly relieved by viscous lidocaine swish and swallow -However, tramadol does not appear to control the pain adequately -I have ordered IR morphine 15 mg q6hrs PRN  Poorly controlled DM -Patient has had persistent hyperglycemia with glucose in 300s -He has been instructed several times to discuss his diabetes management with his PCP, but he has missed all of the appointments -In light of his poorly controlled diabetes, I have ordered glargine 8 units daily and discussed with pharmacy to provide instruction on self administration -I strongly counseled the patient on importance of meeting with his PCP for further management of diabetes, as his persistent hyperglycemia can lead to dehydration  No orders of the defined types were placed in this encounter.  All questions were answered. The patient knows to call the clinic with any problems, questions or concerns. No barriers to learning was detected.  Return  in 1  week for labs, port flush, and clinic appt prior to Cycle 6 of weekly carbo/Taxol.   Tish Men, MD 06/05/2018 11:39 AM  CHIEF COMPLAINT: "I am tired this morning"  INTERVAL HISTORY: Mr. Dax return to clinic for follow-up of stage III lung cancer on definitive chemoradiation.  The patient reports that he woke up this morning feeling very tired.  Prior to today, he has been doing relatively well.  He has chronic, persistent numbness in the first through third digits of the right hand, mild, for which gabapentin has helped improve the symptoms.  He denies any associated pain or interference with ADLs.  He also reports that for the past week, he has developed a sore throat and burning epigastric comfort, exacerbated by eating, resolves after a few minutes without intervention.  He uses the viscous lidocaine swish and swallow, which help improve the pain, but the tramadol has not helped.  He denies any other complaint today.  SUMMARY OF ONCOLOGIC HISTORY:   Lung cancer ([place] [place])   03/31/2018 Imaging    CT CAP with contrast: IMPRESSION: 1. 4.2 x 3.3 x 3.1 cm irregularly marginated mass within the superior segment of the right lower lobe appearing to extend across the fissure into the posterior inferior right upper lobe and extending toward the right hilum where the right upper lobe superior segment bronchus is occluded, consistent with primary lung carcinoma. 2. Small noncalcified nodules more numerous throughout the right lung most consistent with lung metastases. 3. No metastatic involvement of the abdomen or pelvis by CT. 4. Moderate thoracic and abdominal aortic atherosclerosis.    04/03/2018 Initial Diagnosis    Lung cancer ([facility])    05/08/2018 -  Chemotherapy    The patient had palonosetron (ALOXI) injection 0.25 mg, 0.25 mg, Intravenous,  Once, 5 of 7 cycles Administration: 0.25 mg (05/08/2018), 0.25 mg (05/15/2018), 0.25 mg (05/22/2018), 0.25 mg (05/29/2018) CARBOplatin (PARAPLATIN) 200  mg in sodium chloride 0.9 % 250 mL chemo infusion, 200 mg (92.5 % of original dose 215.8 mg), Intravenous,  Once, 5 of 7 cycles Dose modification:   (original dose 215.8 mg, Cycle 1) Administration: 200 mg (05/08/2018), 210 mg (05/15/2018), 180 mg (05/22/2018), 230 mg (05/29/2018) PACLitaxel (TAXOL) 90 mg in sodium chloride 0.9 % 250 mL chemo infusion (</= 80mg /m2), 45 mg/m2 = 90 mg, Intravenous,  Once, 5 of 7 cycles Administration: 90 mg (05/08/2018), 90 mg (05/15/2018), 90 mg (05/22/2018), 90 mg (05/29/2018)  for chemotherapy treatment.     06/05/2018 Cancer Staging    Staging form: Lung, AJCC 8th Edition - Clinical: Stage IIIA (cT4, cN0, cM0) - Signed by Tish Men, MD on 06/05/2018     REVIEW OF SYSTEMS:   Constitutional: ( - ) fevers, ( - )  chills , ( - ) night sweats Eyes: ( - ) blurriness of vision, ( - ) double vision, ( - ) watery eyes Ears, nose, mouth, throat, and face: ( - ) mucositis, ( + ) sore throat Respiratory: ( - ) cough, ( - ) dyspnea, ( - ) wheezes Cardiovascular: ( - ) palpitation, ( - ) chest discomfort, ( - ) lower extremity swelling Gastrointestinal:  ( - ) nausea, ( + ) heartburn, ( - ) change in bowel habits Skin: ( - ) abnormal skin rashes Lymphatics: ( - ) new lymphadenopathy, ( - ) easy bruising Neurological: ( + ) numbness, ( - ) tingling, ( - ) new weaknesses Behavioral/Psych: ( - ) mood change, ( - ) new changes  All other  systems were reviewed with the patient and are negative.  I have reviewed the past medical history, past surgical history, social history and family history with the patient and they are unchanged from previous note.  ALLERGIES:  has No Known Allergies.  MEDICATIONS:  Current Outpatient Medications  Medication Sig Dispense Refill  . amitriptyline (ELAVIL) 25 MG tablet Take 50 mg by mouth at bedtime.    Marland Kitchen amLODipine (NORVASC) 5 MG tablet Take 5 mg by mouth daily.    Marland Kitchen dexamethasone (DECADRON) 4 MG tablet Take 2 tablets (8 mg total) by mouth  daily. Start the day after chemotherapy for 2 days. 30 tablet 1  . diclofenac (VOLTAREN) 75 MG EC tablet Take 75 mg by mouth 2 (two) times daily.    Marland Kitchen gabapentin (NEURONTIN) 300 MG capsule Take 2 capsules (600 mg total) by mouth 2 (two) times daily for 30 days. 120 capsule 5  . lidocaine (XYLOCAINE) 2 % solution Patient: Mix 1part 2% viscous lidocaine, 1part H20. Swish & swallow 25mL of diluted mixture, 22min before meals and at bedtime, up to QID 100 mL 5  . lidocaine-prilocaine (EMLA) cream Apply to affected area once 30 g 3  . LORazepam (ATIVAN) 0.5 MG tablet Take 1 tablet (0.5 mg total) by mouth every 6 (six) hours as needed (Nausea or vomiting). 30 tablet 0  . metFORMIN (GLUCOPHAGE-XR) 500 MG 24 hr tablet Take 1,000 mg by mouth 2 (two) times daily.    . mometasone (ELOCON) 0.1 % cream Apply thin layer to affected areas bid prn    . ondansetron (ZOFRAN) 8 MG tablet Take 1 tablet (8 mg total) by mouth 2 (two) times daily as needed for refractory nausea / vomiting. Start on day 3 after chemo. 30 tablet 1  . prochlorperazine (COMPAZINE) 10 MG tablet Take 1 tablet (10 mg total) by mouth every 6 (six) hours as needed (Nausea or vomiting). 30 tablet 1  . Insulin Glargine (LANTUS) 100 UNIT/ML Solostar Pen Inject 8 Units into the skin daily for 30 days. 3 mL 0  . morphine (MSIR) 15 MG tablet Take 1 tablet (15 mg total) by mouth every 6 (six) hours as needed for up to 30 days for severe pain. 90 tablet 0   No current facility-administered medications for this visit.    Facility-Administered Medications Ordered in Other Visits  Medication Dose Route Frequency Provider Last Rate Last Dose  . 0.9 %  sodium chloride infusion   Intravenous Once Tish Men, MD      . CARBOplatin (PARAPLATIN) 210 mg in sodium chloride 0.9 % 100 mL chemo infusion  210 mg Intravenous Once Tish Men, MD      . dexamethasone (DECADRON) injection 10 mg  10 mg Intravenous Once Tish Men, MD      . diphenhydrAMINE (BENADRYL)  injection 50 mg  50 mg Intravenous Once Tish Men, MD      . famotidine (PEPCID) IVPB 20 mg premix  20 mg Intravenous Once Tish Men, MD      . heparin lock flush 100 unit/mL  500 Units Intracatheter Once PRN Tish Men, MD      . PACLitaxel (TAXOL) 90 mg in sodium chloride 0.9 % 250 mL chemo infusion (</= 80mg /m2)  45 mg/m2 (Treatment Plan Recorded) Intravenous Once Tish Men, MD      . palonosetron Leona Carry) injection 0.25 mg  0.25 mg Intravenous Once Tish Men, MD      . sodium chloride flush (NS) 0.9 % injection 10 mL  10  mL Intracatheter PRN Tish Men, MD        PHYSICAL EXAMINATION: ECOG PERFORMANCE STATUS: 1 - Symptomatic but completely ambulatory  Today's Vitals   06/05/18 1110 06/05/18 1113  BP:  122/79  Pulse:  (!) 114  Resp:  18  Temp:  98.1 F (36.7 C)  TempSrc:  Oral  SpO2:  100%  Weight:  164 lb 4.8 oz (74.5 kg)  Height:  5\' 11"  (1.803 m)  PainSc: 0-No pain 0-No pain   Body mass index is 22.92 kg/m.  Filed Weights   06/05/18 1113  Weight: 164 lb 4.8 oz (74.5 kg)    GENERAL: alert, no distress and comfortable, thin SKIN: skin color, texture, turgor are normal, no rashes or significant lesions EYES: conjunctiva are pink and non-injected, sclera clear OROPHARYNX: no exudate, no erythema; lips, buccal mucosa, and tongue normal  NECK: supple, non-tender LYMPH:  no palpable lymphadenopathy in the cervical LUNGS: clear to auscultation with normal breathing effort HEART: regular rate & rhythm and no murmurs and no lower extremity edema ABDOMEN: soft, non-tender, non-distended, normal bowel sounds Musculoskeletal: no cyanosis of digits and no clubbing  PSYCH: alert & oriented x 3, fluent speech NEURO: no focal motor/sensory deficits  LABORATORY DATA:  I have reviewed the data as listed    Component Value Date/Time   NA 135 06/05/2018 1040   K 4.4 06/05/2018 1040   CL 100 06/05/2018 1040   CO2 27 06/05/2018 1040   GLUCOSE 347 (H) 06/05/2018 1040   BUN 14  06/05/2018 1040   CREATININE 1.07 06/05/2018 1040   CALCIUM 9.2 06/05/2018 1040   PROT 6.2 (L) 06/05/2018 1040   ALBUMIN 4.0 06/05/2018 1040   AST 15 06/05/2018 1040   ALT 41 06/05/2018 1040   ALKPHOS 103 06/05/2018 1040   BILITOT 0.3 06/05/2018 1040   GFRNONAA >60 06/05/2018 1040   GFRAA >60 06/05/2018 1040    No results found for: SPEP, UPEP  Lab Results  Component Value Date   WBC 3.9 (L) 06/05/2018   NEUTROABS 2.8 06/05/2018   HGB 9.9 (L) 06/05/2018   HCT 29.3 (L) 06/05/2018   MCV 93.3 06/05/2018   PLT 131 (L) 06/05/2018      Chemistry      Component Value Date/Time   NA 135 06/05/2018 1040   K 4.4 06/05/2018 1040   CL 100 06/05/2018 1040   CO2 27 06/05/2018 1040   BUN 14 06/05/2018 1040   CREATININE 1.07 06/05/2018 1040      Component Value Date/Time   CALCIUM 9.2 06/05/2018 1040   ALKPHOS 103 06/05/2018 1040   AST 15 06/05/2018 1040   ALT 41 06/05/2018 1040   BILITOT 0.3 06/05/2018 1040

## 2018-06-05 NOTE — Patient Instructions (Signed)
[facility] Discharge Instructions for Patients Receiving Chemotherapy  Today you received the following chemotherapy agents Taxol and Carboplatin.  To help prevent nausea and vomiting after your treatment, we encourage you to take your nausea medication as directed. BUT NO ZOFRAN FOR 3 DAYS    If you develop nausea and vomiting that is not controlled by your nausea medication, call the clinic.   BELOW ARE SYMPTOMS THAT SHOULD BE REPORTED IMMEDIATELY:  *FEVER GREATER THAN 100.5 F  *CHILLS WITH OR WITHOUT FEVER  NAUSEA AND VOMITING THAT IS NOT CONTROLLED WITH YOUR NAUSEA MEDICATION  *UNUSUAL SHORTNESS OF BREATH  *UNUSUAL BRUISING OR BLEEDING  TENDERNESS IN MOUTH AND THROAT WITH OR WITHOUT PRESENCE OF ULCERS  *URINARY PROBLEMS  *BOWEL PROBLEMS  UNUSUAL RASH Items with * indicate a potential emergency and should be followed up as soon as possible.  Feel free to call the clinic you have any questions or concerns. The clinic phone number is [phone].  Please show the [facility] at check-in to the Emergency Department and triage nurse.

## 2018-06-05 NOTE — Patient Instructions (Signed)

## 2018-06-05 NOTE — Progress Notes (Signed)
OK to treat with lab results today per MD Maylon Peppers, in addition give 1L NS and no other orders besides chemo

## 2018-06-08 ENCOUNTER — Other Ambulatory Visit: Payer: Self-pay

## 2018-06-08 ENCOUNTER — Ambulatory Visit
Admission: RE | Admit: 2018-06-08 | Discharge: 2018-06-08 | Disposition: A | Discharge status: Home / Self Care | Payer: BLUE CROSS/BLUE SHIELD | Source: Ambulatory Visit | Attending: Radiation Oncology | Admitting: Radiation Oncology

## 2018-06-08 DIAGNOSIS — Z5111 Encounter for antineoplastic chemotherapy: Secondary | ICD-10-CM | POA: Diagnosis not present

## 2018-06-08 LAB — GLUCOSE, CAPILLARY: Glucose-Capillary: 366 mg/dL — ABNORMAL HIGH (ref 70–99)

## 2018-06-09 ENCOUNTER — Other Ambulatory Visit: Payer: Self-pay

## 2018-06-09 ENCOUNTER — Ambulatory Visit
Admission: RE | Admit: 2018-06-09 | Discharge: 2018-06-09 | Disposition: A | Discharge status: Home / Self Care | Payer: BLUE CROSS/BLUE SHIELD | Source: Ambulatory Visit | Attending: Radiation Oncology | Admitting: Radiation Oncology

## 2018-06-09 DIAGNOSIS — Z5111 Encounter for antineoplastic chemotherapy: Secondary | ICD-10-CM | POA: Diagnosis not present

## 2018-06-10 ENCOUNTER — Ambulatory Visit
Admission: RE | Admit: 2018-06-10 | Discharge: 2018-06-10 | Disposition: A | Discharge status: Home / Self Care | Payer: BLUE CROSS/BLUE SHIELD | Source: Ambulatory Visit | Attending: Radiation Oncology | Admitting: Radiation Oncology

## 2018-06-10 ENCOUNTER — Other Ambulatory Visit: Payer: Self-pay

## 2018-06-10 DIAGNOSIS — Z5111 Encounter for antineoplastic chemotherapy: Secondary | ICD-10-CM | POA: Diagnosis not present

## 2018-06-11 ENCOUNTER — Other Ambulatory Visit: Payer: Self-pay

## 2018-06-11 ENCOUNTER — Ambulatory Visit
Admission: RE | Admit: 2018-06-11 | Discharge: 2018-06-11 | Disposition: A | Discharge status: Home / Self Care | Payer: BLUE CROSS/BLUE SHIELD | Source: Ambulatory Visit | Attending: Radiation Oncology | Admitting: Radiation Oncology

## 2018-06-11 DIAGNOSIS — Z5111 Encounter for antineoplastic chemotherapy: Secondary | ICD-10-CM | POA: Diagnosis not present

## 2018-06-12 ENCOUNTER — Encounter: Payer: Self-pay | Admitting: Hematology

## 2018-06-12 ENCOUNTER — Telehealth: Payer: Self-pay | Admitting: Hematology

## 2018-06-12 ENCOUNTER — Other Ambulatory Visit: Payer: Self-pay

## 2018-06-12 ENCOUNTER — Inpatient Hospital Stay: Payer: BLUE CROSS/BLUE SHIELD

## 2018-06-12 ENCOUNTER — Ambulatory Visit
Admission: RE | Admit: 2018-06-12 | Discharge: 2018-06-12 | Disposition: A | Discharge status: Home / Self Care | Payer: BLUE CROSS/BLUE SHIELD | Source: Ambulatory Visit | Attending: Radiation Oncology | Admitting: Radiation Oncology

## 2018-06-12 ENCOUNTER — Other Ambulatory Visit: Payer: Self-pay | Admitting: Hematology

## 2018-06-12 ENCOUNTER — Inpatient Hospital Stay (HOSPITAL_BASED_OUTPATIENT_CLINIC_OR_DEPARTMENT_OTHER): Payer: BLUE CROSS/BLUE SHIELD | Admitting: Hematology

## 2018-06-12 VITALS — BP 144/81 | HR 116 | Temp 97.9°F | Resp 18 | Wt 171.0 lb

## 2018-06-12 DIAGNOSIS — R110 Nausea: Secondary | ICD-10-CM | POA: Insufficient documentation

## 2018-06-12 DIAGNOSIS — D6481 Anemia due to antineoplastic chemotherapy: Secondary | ICD-10-CM

## 2018-06-12 DIAGNOSIS — D61811 Other drug-induced pancytopenia: Secondary | ICD-10-CM | POA: Diagnosis not present

## 2018-06-12 DIAGNOSIS — G629 Polyneuropathy, unspecified: Secondary | ICD-10-CM | POA: Diagnosis not present

## 2018-06-12 DIAGNOSIS — Z791 Long term (current) use of non-steroidal anti-inflammatories (NSAID): Secondary | ICD-10-CM

## 2018-06-12 DIAGNOSIS — C3431 Malignant neoplasm of lower lobe, right bronchus or lung: Secondary | ICD-10-CM | POA: Diagnosis not present

## 2018-06-12 DIAGNOSIS — K208 Other esophagitis without bleeding: Secondary | ICD-10-CM

## 2018-06-12 DIAGNOSIS — Z5111 Encounter for antineoplastic chemotherapy: Secondary | ICD-10-CM | POA: Diagnosis not present

## 2018-06-12 DIAGNOSIS — Z794 Long term (current) use of insulin: Secondary | ICD-10-CM

## 2018-06-12 DIAGNOSIS — E1165 Type 2 diabetes mellitus with hyperglycemia: Secondary | ICD-10-CM

## 2018-06-12 DIAGNOSIS — Z7984 Long term (current) use of oral hypoglycemic drugs: Secondary | ICD-10-CM

## 2018-06-12 DIAGNOSIS — D701 Agranulocytosis secondary to cancer chemotherapy: Secondary | ICD-10-CM

## 2018-06-12 DIAGNOSIS — T451X5A Adverse effect of antineoplastic and immunosuppressive drugs, initial encounter: Secondary | ICD-10-CM | POA: Insufficient documentation

## 2018-06-12 DIAGNOSIS — E1142 Type 2 diabetes mellitus with diabetic polyneuropathy: Secondary | ICD-10-CM

## 2018-06-12 DIAGNOSIS — T66XXXA Radiation sickness, unspecified, initial encounter: Secondary | ICD-10-CM

## 2018-06-12 DIAGNOSIS — Z79899 Other long term (current) drug therapy: Secondary | ICD-10-CM

## 2018-06-12 DIAGNOSIS — Z923 Personal history of irradiation: Secondary | ICD-10-CM

## 2018-06-12 LAB — CMP (CANCER CENTER ONLY)
ALT: 24 U/L (ref 0–44)
AST: 11 U/L — ABNORMAL LOW (ref 15–41)
Albumin: 3.8 g/dL (ref 3.5–5.0)
Alkaline Phosphatase: 82 U/L (ref 38–126)
Anion gap: 6 (ref 5–15)
BUN: 9 mg/dL (ref 8–23)
CO2: 28 mmol/L (ref 22–32)
Calcium: 8.8 mg/dL — ABNORMAL LOW (ref 8.9–10.3)
Chloride: 103 mmol/L (ref 98–111)
Creatinine: 0.92 mg/dL (ref 0.61–1.24)
GFR, Est AFR Am: >60 mL/min (ref >60)
GFR, Estimated: >60 mL/min (ref >60)
Glucose, Bld: 206 mg/dL — ABNORMAL HIGH (ref 70–99)
Potassium: 4.3 mmol/L (ref 3.5–5.1)
Sodium: 137 mmol/L (ref 135–145)
Total Bilirubin: 0.5 mg/dL (ref 0.3–1.2)
Total Protein: 5.8 g/dL — ABNORMAL LOW (ref 6.5–8.1)

## 2018-06-12 LAB — CBC WITH DIFFERENTIAL (CANCER CENTER ONLY)
Abs Immature Granulocytes: 0.03 10*3/uL (ref 0.00–0.07)
Basophils Absolute: 0 10*3/uL (ref 0.0–0.1)
Basophils Relative: 0 %
Eosinophils Absolute: 0 10*3/uL (ref 0.0–0.5)
Eosinophils Relative: 1 %
HCT: 27.6 % — ABNORMAL LOW (ref 39.0–52.0)
Hemoglobin: 9 g/dL — ABNORMAL LOW (ref 13.0–17.0)
Immature Granulocytes: 1 %
LYMPHS PCT: 18 %
Lymphs Abs: 0.6 10*3/uL — ABNORMAL LOW (ref 0.7–4.0)
MCH: 30.7 pg (ref 26.0–34.0)
MCHC: 32.6 g/dL (ref 30.0–36.0)
MCV: 94.2 fL (ref 80.0–100.0)
Monocytes Absolute: 0.2 10*3/uL (ref 0.1–1.0)
Monocytes Relative: 6 %
Neutro Abs: 2.6 10*3/uL (ref 1.7–7.7)
Neutrophils Relative %: 74 %
Platelet Count: 175 10*3/uL (ref 150–400)
RBC: 2.93 MIL/uL — ABNORMAL LOW (ref 4.22–5.81)
RDW: 12.9 % (ref 11.5–15.5)
WBC Count: 3.4 10*3/uL — ABNORMAL LOW (ref 4.0–10.5)
nRBC: 0 % (ref 0.0–0.2)

## 2018-06-12 LAB — MAGNESIUM: Magnesium: 1.5 mg/dL — ABNORMAL LOW (ref 1.7–2.4)

## 2018-06-12 MED ORDER — PALONOSETRON HCL INJECTION 0.25 MG/5ML
0.2500 mg | Freq: Once | INTRAVENOUS | Status: AC
  Administered 2018-06-12: 0.25 mg via INTRAVENOUS

## 2018-06-12 MED ORDER — FAMOTIDINE IN NACL 20-0.9 MG/50ML-% IV SOLN
20.0000 mg | Freq: Once | INTRAVENOUS | Status: AC
  Administered 2018-06-12: 20 mg via INTRAVENOUS

## 2018-06-12 MED ORDER — DIPHENHYDRAMINE HCL 50 MG/ML IJ SOLN
50.0000 mg | Freq: Once | INTRAMUSCULAR | Status: AC
  Administered 2018-06-12: 50 mg via INTRAVENOUS

## 2018-06-12 MED ORDER — PALONOSETRON HCL INJECTION 0.25 MG/5ML
INTRAVENOUS | Status: AC
  Filled 2018-06-12: qty 5

## 2018-06-12 MED ORDER — DIPHENHYDRAMINE HCL 50 MG/ML IJ SOLN
INTRAMUSCULAR | Status: AC
  Filled 2018-06-12: qty 1

## 2018-06-12 MED ORDER — MAGNESIUM OXIDE 400 (241.3 MG) MG PO TABS: 400.0000 mg | ORAL_TABLET | Freq: Two times a day (BID) | ORAL | 3 refills | Status: AC

## 2018-06-12 MED ORDER — SODIUM CHLORIDE 0.9 % IV SOLN
2.0000 g | Freq: Once | INTRAVENOUS | Status: AC
  Filled 2018-06-12: qty 4

## 2018-06-12 MED ORDER — SODIUM CHLORIDE 0.9% FLUSH
10.0000 mL | Freq: Once | INTRAVENOUS | Status: AC | PRN
  Administered 2018-06-12: 10 mL
  Filled 2018-06-12: qty 10

## 2018-06-12 MED ORDER — DEXAMETHASONE SODIUM PHOSPHATE 10 MG/ML IJ SOLN
INTRAMUSCULAR | Status: AC
  Filled 2018-06-12: qty 1

## 2018-06-12 MED ORDER — SODIUM CHLORIDE 0.9 % IV SOLN
Freq: Once | INTRAVENOUS | Status: AC
  Administered 2018-06-12: 11:00:00 via INTRAVENOUS
  Filled 2018-06-12: qty 250

## 2018-06-12 MED ORDER — SODIUM CHLORIDE 0.9 % IV SOLN
231.6000 mg | Freq: Once | INTRAVENOUS | Status: AC
  Administered 2018-06-12: 230 mg via INTRAVENOUS
  Filled 2018-06-12: qty 23

## 2018-06-12 MED ORDER — SODIUM CHLORIDE 0.9% FLUSH
10.0000 mL | INTRAVENOUS | Status: DC | PRN
  Administered 2018-06-12: 10 mL
  Filled 2018-06-12: qty 10

## 2018-06-12 MED ORDER — DEXAMETHASONE SODIUM PHOSPHATE 10 MG/ML IJ SOLN
10.0000 mg | Freq: Once | INTRAMUSCULAR | Status: AC
  Administered 2018-06-12: 10 mg via INTRAVENOUS

## 2018-06-12 MED ORDER — SODIUM CHLORIDE 0.9 % IV SOLN
45.0000 mg/m2 | Freq: Once | INTRAVENOUS | Status: AC
  Administered 2018-06-12: 90 mg via INTRAVENOUS
  Filled 2018-06-12: qty 15

## 2018-06-12 MED ORDER — HEPARIN SOD (PORK) LOCK FLUSH 100 UNIT/ML IV SOLN
500.0000 [IU] | Freq: Once | INTRAVENOUS | Status: AC | PRN
  Administered 2018-06-12: 500 [IU]
  Filled 2018-06-12: qty 5

## 2018-06-12 MED ORDER — SODIUM CHLORIDE 0.9 % IV SOLN
2.0000 g | Freq: Once | INTRAVENOUS | Status: AC
  Administered 2018-06-12: 2 g via INTRAVENOUS
  Filled 2018-06-12: qty 4

## 2018-06-12 NOTE — Telephone Encounter (Signed)
No change in appts per 3/20 los

## 2018-06-12 NOTE — Progress Notes (Signed)
Ok to treat with heart rate 116 per. Dr. Maylon Peppers.

## 2018-06-12 NOTE — Progress Notes (Signed)
[place] OFFICE PROGRESS NOTE  Patient Care Team: Patient, No Pcp Per as PCP - General (General Practice) Cordelia Poche, RN as Oncology Nurse Navigator Tish Men, MD as Medical Oncologist (Hematology)  HEME/ONC OVERVIEW: 1. Stage IIIA [name]) adenocarcinoma of the right lung  -03/2018: CT CAP showed a 4.2 x 3.3 x 3.1cm RLL mass extending to RUL, additional small noncalcified nodules throughout the right lung; no metastatic disease in the abdomen/pelvis   PET showed FDG-avid RLL mass 5.3 x 3.6cm and a satellite nodule in RLL; borderline FDG-avid (SUV 2.6) paratracheal LN   MRI brain negative for mets   EBUS w/ bx of the RLL mass showed poorly differentiated adenoCa with features of squamous cell differentiation; 4R LN negative for malignancy -04/2018 - present: definitive chemoradiation with weekly carbo/Taxol  2. Port placed in 04/2018  TREATMENT REGIMEN:  05/08/2018 - present: weekly carbo/Taxol concurrent with radiation; plan for 7 cycles   PERTINENT NON-HEM/ONC PROBLEMS: 1. Hx of tobacco use, quit in early 03/2018  ASSESSMENT & PLAN:   Stage IIIA ([id number]) adenocarcinoma of the right lung  -S/p 5 cycles of weekly carbo/Taxol concurrent with radiation  -Labs appropriate, proceed with Cycle 6 of chemotherapy today; plan for 7 cycles -After the completion of chemoradiation (last day of RT on 06/25/2018), we will plan to repeat CT CAP in 2 weeks -If stable or improving disease, we can consider maintenance durvalumab  -PRN antiemetics: Zofran, Compazine, and Ativan  Chemotherapy-associated anemia -Secondary to chemotherapy -Hgb 9.0, down from last week -Patient denies any symptom of bleeding -We will monitor for now; no indication for dose adjustment -If anemia worsens in the future, we will consider delaying chemotherapy or adjusting chemotherapy dose  Chemotherapy-associated leukopenia -Secondary to chemotherapy -WBC 3.4k with ANC 2600, lower than last  week -Patient denies any symptoms of infection -We will monitor for now; no indication for dose adjustment -If leukopenia worsens in the future, we will consider delaying chemotherapy or adjusting chemotherapy dose  Chemotherapy-associated nausea  -Secondary to chemotherapy -Symptoms relatively well controlled  -Continue PRN-anti-emetics   Peripheral neuropathy -Secondary to underlying DM, mildly exacerbated by chemotherapy -Symptoms overall improved on gabapentin 600mg  BID  -I counseled the patient on monitoring his neuropathy, and if it becomes worse or interferes with ADLs, he is instructed to call the clinic promptly   Radiation-associated esophagitis -Secondary to radiation -Pain relatively well controlled on IR morphine 15mg  q6hrs PRN and viscous lidocaine swish and swallow -Continue the regimen above   Poorly controlled DM -Patient missed several appointments with his PCP, and glargine 8 units daily was initiated at the last clinic visit due to persistently hyperglycemia -Since initiating insulin, his glucose has improved from the 300's to mid-150's -His next appt with his PCP is next week; I strongly encouraged patient to keep his appointments PCP for further management of his diabetes  No orders of the defined types were placed in this encounter.  All questions were answered. The patient knows to call the clinic with any problems, questions or concerns. No barriers to learning was detected.  Return in 1 week for labs, port flush, clinic appointment prior to Cycle 7 of weekly carbo/Taxol.  Tish Men, MD 06/12/2018 10:43 AM  CHIEF COMPLAINT: "I just have a little headache"  INTERVAL HISTORY: Mr. Bircher returns to clinic for follow-up of stage III lung cancer on definitive carbo/Taxol.  Patient reports that after the chemo infusion last week, he developed a mild nausea and one episode of emesis on  Friday night.  He denied any coffee-ground or bloody emesis.  He took the  PRN antiemetics with resolution of his symptoms.  Since increasing his gabapentin to 600 mg twice daily, his neuropathy in the hands and the feet has improved significantly, and he currently only has minimal numbness sensation in the feet.  Over the past few days, he has noticed an intermittent bilateral temporal headache achy, nonradiating, fluctuating intensity, without any associated neurologic symptoms.  He has not taken any pain medication, as it is not severe enough to require any treatment.  He has an appointment with his PCP next week for the management of his diabetes.  Since starting insulin last week, his glucose is improved to mid-100s.  He denies any episodes of hypoglycemia.  SUMMARY OF ONCOLOGIC HISTORY:   Lung cancer ([place])   03/31/2018 Imaging    CT CAP with contrast: IMPRESSION: 1. 4.2 x 3.3 x 3.1 cm irregularly marginated mass within the superior segment of the right lower lobe appearing to extend across the fissure into the posterior inferior right upper lobe and extending toward the right hilum where the right upper lobe superior segment bronchus is occluded, consistent with primary lung carcinoma. 2. Small noncalcified nodules more numerous throughout the right lung most consistent with lung metastases. 3. No metastatic involvement of the abdomen or pelvis by CT. 4. Moderate thoracic and abdominal aortic atherosclerosis.    04/03/2018 Initial Diagnosis    Lung cancer ([place])    05/08/2018 -  Chemotherapy    The patient had palonosetron (ALOXI) injection 0.25 mg, 0.25 mg, Intravenous,  Once, 5 of 7 cycles Administration: 0.25 mg (05/08/2018), 0.25 mg (06/05/2018), 0.25 mg (05/15/2018), 0.25 mg (05/22/2018), 0.25 mg (05/29/2018) CARBOplatin (PARAPLATIN) 200 mg in sodium chloride 0.9 % 250 mL chemo infusion, 200 mg (92.5 % of original dose 215.8 mg), Intravenous,  Once, 5 of 7 cycles Dose modification:   (original dose 215.8 mg, Cycle 1) Administration: 200 mg (05/08/2018), 210 mg  (06/05/2018), 210 mg (05/15/2018), 180 mg (05/22/2018), 230 mg (05/29/2018) PACLitaxel (TAXOL) 90 mg in sodium chloride 0.9 % 250 mL chemo infusion (</= 80mg /m2), 45 mg/m2 = 90 mg, Intravenous,  Once, 5 of 7 cycles Administration: 90 mg (05/08/2018), 90 mg (06/05/2018), 90 mg (05/15/2018), 90 mg (05/22/2018), 90 mg (05/29/2018)  for chemotherapy treatment.     06/05/2018 Cancer Staging    Staging form: Lung, AJCC 8th Edition - Clinical: Stage IIIA (cT4, cN0, cM0) - Signed by Tish Men, MD on 06/05/2018     REVIEW OF SYSTEMS:   Constitutional: ( - ) fevers, ( - )  chills , ( - ) night sweats Eyes: ( - ) blurriness of vision, ( - ) double vision, ( - ) watery eyes Ears, nose, mouth, throat, and face: ( - ) mucositis, ( - ) sore throat Respiratory: ( - ) cough, ( - ) dyspnea, ( - ) wheezes Cardiovascular: ( - ) palpitation, ( - ) chest discomfort, ( - ) lower extremity swelling Gastrointestinal:  ( + ) nausea, ( - ) heartburn, ( - ) change in bowel habits Skin: ( - ) abnormal skin rashes Lymphatics: ( - ) new lymphadenopathy, ( - ) easy bruising Neurological: ( + ) numbness, ( - ) tingling, ( - ) new weaknesses Behavioral/Psych: ( - ) mood change, ( - ) new changes  All other systems were reviewed with the patient and are negative.  I have reviewed the past medical history, past surgical history, social history and family  history with the patient and they are unchanged from previous note.  ALLERGIES:  has No Known Allergies.  MEDICATIONS:  Current Outpatient Medications  Medication Sig Dispense Refill  . amitriptyline (ELAVIL) 25 MG tablet Take 50 mg by mouth at bedtime.    Marland Kitchen amLODipine (NORVASC) 5 MG tablet Take 5 mg by mouth daily.    Marland Kitchen dexamethasone (DECADRON) 4 MG tablet Take 2 tablets (8 mg total) by mouth daily. Start the day after chemotherapy for 2 days. 30 tablet 1  . diclofenac (VOLTAREN) 75 MG EC tablet Take 75 mg by mouth 2 (two) times daily.    Marland Kitchen gabapentin (NEURONTIN) 300 MG capsule  Take 2 capsules (600 mg total) by mouth 2 (two) times daily for 30 days. 120 capsule 5  . Insulin Glargine (LANTUS) 100 UNIT/ML Solostar Pen Inject 8 Units into the skin daily for 30 days. 3 mL 0  . lidocaine (XYLOCAINE) 2 % solution Patient: Mix 1part 2% viscous lidocaine, 1part H20. Swish & swallow 33mL of diluted mixture, 14min before meals and at bedtime, up to QID 100 mL 5  . lidocaine-prilocaine (EMLA) cream Apply to affected area once 30 g 3  . LORazepam (ATIVAN) 0.5 MG tablet Take 1 tablet (0.5 mg total) by mouth every 6 (six) hours as needed (Nausea or vomiting). 30 tablet 0  . metFORMIN (GLUCOPHAGE-XR) 500 MG 24 hr tablet Take 1,000 mg by mouth 2 (two) times daily.    . mometasone (ELOCON) 0.1 % cream Apply thin layer to affected areas bid prn    . morphine (MSIR) 15 MG tablet Take 1 tablet (15 mg total) by mouth every 6 (six) hours as needed for up to 30 days for severe pain. 90 tablet 0  . ondansetron (ZOFRAN) 8 MG tablet Take 1 tablet (8 mg total) by mouth 2 (two) times daily as needed for refractory nausea / vomiting. Start on day 3 after chemo. 30 tablet 1  . prochlorperazine (COMPAZINE) 10 MG tablet Take 1 tablet (10 mg total) by mouth every 6 (six) hours as needed (Nausea or vomiting). 30 tablet 1   No current facility-administered medications for this visit.     PHYSICAL EXAMINATION: ECOG PERFORMANCE STATUS: 1 - Symptomatic but completely ambulatory  Today's Vitals   06/12/18 1037  BP: (!) 144/81  Pulse: (!) 116  Resp: 18  Temp: 97.9 F (36.6 C)  TempSrc: Oral  SpO2: 100%  Weight: 171 lb (77.6 kg)  PainSc: 0-No pain   Body mass index is 23.85 kg/m.  Filed Weights   06/12/18 1037  Weight: 171 lb (77.6 kg)    GENERAL: alert, no distress and comfortable, slightly thin SKIN: skin color, texture, turgor are normal, no rashes or significant lesions EYES: conjunctiva are pink and non-injected, sclera clear OROPHARYNX: no exudate, no erythema; lips, buccal mucosa, and  tongue normal  NECK: supple, non-tender LYMPH:  no palpable lymphadenopathy in the cervical LUNGS: clear to auscultation with normal breathing effort HEART: regular rate & rhythm and no murmurs and no lower extremity edema ABDOMEN: soft, non-tender, non-distended, normal bowel sounds Musculoskeletal: no cyanosis of digits and no clubbing  PSYCH: alert & oriented x 3, fluent speech NEURO: no focal motor/sensory deficits  LABORATORY DATA:  I have reviewed the data as listed    Component Value Date/Time   NA 135 06/05/2018 1040   K 4.4 06/05/2018 1040   CL 100 06/05/2018 1040   CO2 27 06/05/2018 1040   GLUCOSE 347 (H) 06/05/2018 1040   BUN  14 06/05/2018 1040   CREATININE 1.07 06/05/2018 1040   CALCIUM 9.2 06/05/2018 1040   PROT 6.2 (L) 06/05/2018 1040   ALBUMIN 4.0 06/05/2018 1040   AST 15 06/05/2018 1040   ALT 41 06/05/2018 1040   ALKPHOS 103 06/05/2018 1040   BILITOT 0.3 06/05/2018 1040   GFRNONAA >60 06/05/2018 1040   GFRAA >60 06/05/2018 1040    No results found for: SPEP, UPEP  Lab Results  Component Value Date   WBC 3.4 (L) 06/12/2018   NEUTROABS 2.6 06/12/2018   HGB 9.0 (L) 06/12/2018   HCT 27.6 (L) 06/12/2018   MCV 94.2 06/12/2018   PLT 175 06/12/2018      Chemistry      Component Value Date/Time   NA 135 06/05/2018 1040   K 4.4 06/05/2018 1040   CL 100 06/05/2018 1040   CO2 27 06/05/2018 1040   BUN 14 06/05/2018 1040   CREATININE 1.07 06/05/2018 1040      Component Value Date/Time   CALCIUM 9.2 06/05/2018 1040   ALKPHOS 103 06/05/2018 1040   AST 15 06/05/2018 1040   ALT 41 06/05/2018 1040   BILITOT 0.3 06/05/2018 1040

## 2018-06-12 NOTE — Patient Instructions (Signed)
[facility] Discharge Instructions for Patients Receiving Chemotherapy  Today you received the following chemotherapy agents Taxol/Carboplatin  To help prevent nausea and vomiting after your treatment, we encourage you to take your nausea medication as prescribed.   If you develop nausea and vomiting that is not controlled by your nausea medication, call the clinic.   BELOW ARE SYMPTOMS THAT SHOULD BE REPORTED IMMEDIATELY:  *FEVER GREATER THAN 100.5 F  *CHILLS WITH OR WITHOUT FEVER  NAUSEA AND VOMITING THAT IS NOT CONTROLLED WITH YOUR NAUSEA MEDICATION  *UNUSUAL SHORTNESS OF BREATH  *UNUSUAL BRUISING OR BLEEDING  TENDERNESS IN MOUTH AND THROAT WITH OR WITHOUT PRESENCE OF ULCERS  *URINARY PROBLEMS  *BOWEL PROBLEMS  UNUSUAL RASH Items with * indicate a potential emergency and should be followed up as soon as possible.  Feel free to call the clinic should you have any questions or concerns. The clinic phone number is [phone].  Please show the CHEMO ALERT CARD at check-in to the Emergency Department and triage nurse.   

## 2018-06-12 NOTE — Patient Instructions (Signed)

## 2018-06-15 ENCOUNTER — Other Ambulatory Visit: Payer: Self-pay

## 2018-06-15 ENCOUNTER — Ambulatory Visit
Admission: RE | Admit: 2018-06-15 | Discharge: 2018-06-15 | Disposition: A | Discharge status: Home / Self Care | Payer: BLUE CROSS/BLUE SHIELD | Source: Ambulatory Visit | Attending: Radiation Oncology | Admitting: Radiation Oncology

## 2018-06-15 DIAGNOSIS — Z5111 Encounter for antineoplastic chemotherapy: Secondary | ICD-10-CM | POA: Diagnosis not present

## 2018-06-16 ENCOUNTER — Ambulatory Visit
Admission: RE | Admit: 2018-06-16 | Discharge: 2018-06-16 | Disposition: A | Discharge status: Home / Self Care | Payer: BLUE CROSS/BLUE SHIELD | Source: Ambulatory Visit | Attending: Radiation Oncology | Admitting: Radiation Oncology

## 2018-06-16 ENCOUNTER — Other Ambulatory Visit: Payer: Self-pay

## 2018-06-16 DIAGNOSIS — Z5111 Encounter for antineoplastic chemotherapy: Secondary | ICD-10-CM | POA: Diagnosis not present

## 2018-06-17 ENCOUNTER — Other Ambulatory Visit: Payer: Self-pay

## 2018-06-17 ENCOUNTER — Ambulatory Visit
Admission: RE | Admit: 2018-06-17 | Discharge: 2018-06-17 | Disposition: A | Discharge status: Home / Self Care | Payer: BLUE CROSS/BLUE SHIELD | Source: Ambulatory Visit | Attending: Radiation Oncology | Admitting: Radiation Oncology

## 2018-06-17 DIAGNOSIS — Z5111 Encounter for antineoplastic chemotherapy: Secondary | ICD-10-CM | POA: Diagnosis not present

## 2018-06-18 ENCOUNTER — Encounter: Payer: Self-pay | Admitting: General Practice

## 2018-06-18 ENCOUNTER — Other Ambulatory Visit: Payer: Self-pay

## 2018-06-18 ENCOUNTER — Ambulatory Visit
Admission: RE | Admit: 2018-06-18 | Discharge: 2018-06-18 | Disposition: A | Discharge status: Home / Self Care | Payer: BLUE CROSS/BLUE SHIELD | Source: Ambulatory Visit | Attending: Radiation Oncology | Admitting: Radiation Oncology

## 2018-06-18 DIAGNOSIS — Z5111 Encounter for antineoplastic chemotherapy: Secondary | ICD-10-CM | POA: Diagnosis not present

## 2018-06-18 NOTE — Progress Notes (Signed)
[place] CSW Progress Notes  Referral received from Hilario Quarry, [facility].  Patient diagnosed w lung cancer, unable to work, financial stress.  Assessed over phone for needs - lives w fiancee who has income from retirement. "We are making it on her income, but its very tight."  Patient does not like to eat anything other than soup - "I have lost my tastebuds. "  Does not like nutritional supplements he has tried.  Reports he is nearing end of radiation and chemotherapy treatments.  Has applied for disability via [facility], wants to connect w them to discuss recent letter from [employer] to make sure he is understanding process.  Given his financial distress, CSW asked onsite CSW Wallis Bamberg to issue 2nd distribution of [employer] $50 gift cards today as well as providing patient w copy of [employer] gas card application.  Can assist patient w completing [employer] application by phone.  Asked [facility] to call him - they responded that they have left him VM and request he call them back.  Patient notified by VM.  Edwyna Shell, LCSW Clinical Social Worker Phone:  [phone]

## 2018-06-19 ENCOUNTER — Inpatient Hospital Stay: Payer: BLUE CROSS/BLUE SHIELD

## 2018-06-19 ENCOUNTER — Ambulatory Visit
Admission: RE | Admit: 2018-06-19 | Discharge: 2018-06-19 | Disposition: A | Discharge status: Home / Self Care | Payer: BLUE CROSS/BLUE SHIELD | Source: Ambulatory Visit | Attending: Radiation Oncology | Admitting: Radiation Oncology

## 2018-06-19 ENCOUNTER — Other Ambulatory Visit: Payer: Self-pay

## 2018-06-19 ENCOUNTER — Encounter: Payer: Self-pay | Admitting: *Deleted

## 2018-06-19 ENCOUNTER — Encounter: Payer: Self-pay | Admitting: Hematology

## 2018-06-19 ENCOUNTER — Inpatient Hospital Stay (HOSPITAL_BASED_OUTPATIENT_CLINIC_OR_DEPARTMENT_OTHER): Payer: BLUE CROSS/BLUE SHIELD | Admitting: Hematology

## 2018-06-19 VITALS — BP 125/67 | HR 118 | Temp 97.5°F | Resp 19 | Ht 71.0 in | Wt 167.0 lb

## 2018-06-19 DIAGNOSIS — D6481 Anemia due to antineoplastic chemotherapy: Secondary | ICD-10-CM

## 2018-06-19 DIAGNOSIS — D701 Agranulocytosis secondary to cancer chemotherapy: Secondary | ICD-10-CM

## 2018-06-19 DIAGNOSIS — T66XXXA Radiation sickness, unspecified, initial encounter: Secondary | ICD-10-CM

## 2018-06-19 DIAGNOSIS — K208 Other esophagitis without bleeding: Secondary | ICD-10-CM

## 2018-06-19 DIAGNOSIS — C3431 Malignant neoplasm of lower lobe, right bronchus or lung: Secondary | ICD-10-CM | POA: Diagnosis not present

## 2018-06-19 DIAGNOSIS — R110 Nausea: Secondary | ICD-10-CM

## 2018-06-19 DIAGNOSIS — T451X5A Adverse effect of antineoplastic and immunosuppressive drugs, initial encounter: Secondary | ICD-10-CM

## 2018-06-19 DIAGNOSIS — Z79899 Other long term (current) drug therapy: Secondary | ICD-10-CM

## 2018-06-19 DIAGNOSIS — E1142 Type 2 diabetes mellitus with diabetic polyneuropathy: Secondary | ICD-10-CM

## 2018-06-19 DIAGNOSIS — Z5111 Encounter for antineoplastic chemotherapy: Secondary | ICD-10-CM | POA: Diagnosis not present

## 2018-06-19 LAB — CMP (CANCER CENTER ONLY)
ALT: 21 U/L (ref 0–44)
AST: 13 U/L — ABNORMAL LOW (ref 15–41)
Albumin: 4 g/dL (ref 3.5–5.0)
Alkaline Phosphatase: 94 U/L (ref 38–126)
Anion gap: 10 (ref 5–15)
BUN: 21 mg/dL (ref 8–23)
CO2: 26 mmol/L (ref 22–32)
Calcium: 9.4 mg/dL (ref 8.9–10.3)
Chloride: 104 mmol/L (ref 98–111)
Creatinine: 1.1 mg/dL (ref 0.61–1.24)
GFR, Est AFR Am: >60 mL/min (ref >60)
GFR, Estimated: >60 mL/min (ref >60)
Glucose, Bld: 243 mg/dL — ABNORMAL HIGH (ref 70–99)
Potassium: 4.3 mmol/L (ref 3.5–5.1)
Sodium: 140 mmol/L (ref 135–145)
Total Bilirubin: 0.3 mg/dL (ref 0.3–1.2)
Total Protein: 6.5 g/dL (ref 6.5–8.1)

## 2018-06-19 LAB — CBC WITH DIFFERENTIAL (CANCER CENTER ONLY)
Abs Immature Granulocytes: 0.07 10*3/uL (ref 0.00–0.07)
Basophils Absolute: 0 10*3/uL (ref 0.0–0.1)
Basophils Relative: 1 %
Eosinophils Absolute: 0 10*3/uL (ref 0.0–0.5)
Eosinophils Relative: 1 %
HCT: 26.9 % — ABNORMAL LOW (ref 39.0–52.0)
Hemoglobin: 8.8 g/dL — ABNORMAL LOW (ref 13.0–17.0)
Immature Granulocytes: 2 %
LYMPHS PCT: 18 %
Lymphs Abs: 0.6 10*3/uL — ABNORMAL LOW (ref 0.7–4.0)
MCH: 31.2 pg (ref 26.0–34.0)
MCHC: 32.7 g/dL (ref 30.0–36.0)
MCV: 95.4 fL (ref 80.0–100.0)
Monocytes Absolute: 0.4 10*3/uL (ref 0.1–1.0)
Monocytes Relative: 12 %
Neutro Abs: 2.2 10*3/uL (ref 1.7–7.7)
Neutrophils Relative %: 66 %
Platelet Count: 291 10*3/uL (ref 150–400)
RBC: 2.82 MIL/uL — ABNORMAL LOW (ref 4.22–5.81)
RDW: 13.6 % (ref 11.5–15.5)
WBC Count: 3.3 10*3/uL — ABNORMAL LOW (ref 4.0–10.5)
nRBC: 1.5 % — ABNORMAL HIGH (ref 0.0–0.2)

## 2018-06-19 LAB — MAGNESIUM: Magnesium: 1.7 mg/dL (ref 1.7–2.4)

## 2018-06-19 MED ORDER — DEXAMETHASONE SODIUM PHOSPHATE 10 MG/ML IJ SOLN
INTRAMUSCULAR | Status: AC
  Filled 2018-06-19: qty 1

## 2018-06-19 MED ORDER — FAMOTIDINE IN NACL 20-0.9 MG/50ML-% IV SOLN: 20.0000 mg | Freq: Once | INTRAVENOUS | Status: DC

## 2018-06-19 MED ORDER — DEXAMETHASONE 4 MG PO TABS: 8.0000 mg | ORAL_TABLET | Freq: Every day | ORAL | 1 refills | Status: DC

## 2018-06-19 MED ORDER — SODIUM CHLORIDE 0.9% FLUSH
10.0000 mL | INTRAVENOUS | Status: DC | PRN
  Administered 2018-06-19: 10 mL
  Filled 2018-06-19: qty 10

## 2018-06-19 MED ORDER — DEXAMETHASONE SODIUM PHOSPHATE 10 MG/ML IJ SOLN
10.0000 mg | Freq: Once | INTRAMUSCULAR | Status: AC
  Administered 2018-06-19: 10 mg via INTRAVENOUS

## 2018-06-19 MED ORDER — SODIUM CHLORIDE 0.9 % IV SOLN
Freq: Once | INTRAVENOUS | Status: AC
  Administered 2018-06-19: 11:00:00 via INTRAVENOUS
  Filled 2018-06-19: qty 250

## 2018-06-19 MED ORDER — DIPHENHYDRAMINE HCL 50 MG/ML IJ SOLN
50.0000 mg | Freq: Once | INTRAMUSCULAR | Status: AC
  Administered 2018-06-19: 50 mg via INTRAVENOUS

## 2018-06-19 MED ORDER — HEPARIN SOD (PORK) LOCK FLUSH 100 UNIT/ML IV SOLN
500.0000 [IU] | Freq: Once | INTRAVENOUS | Status: AC | PRN
  Administered 2018-06-19: 500 [IU]
  Filled 2018-06-19: qty 5

## 2018-06-19 MED ORDER — DIPHENHYDRAMINE HCL 50 MG/ML IJ SOLN
INTRAMUSCULAR | Status: AC
  Filled 2018-06-19: qty 1

## 2018-06-19 MED ORDER — SODIUM CHLORIDE 0.9 % IV SOLN
45.0000 mg/m2 | Freq: Once | INTRAVENOUS | Status: AC
  Administered 2018-06-19: 90 mg via INTRAVENOUS
  Filled 2018-06-19: qty 15

## 2018-06-19 MED ORDER — PALONOSETRON HCL INJECTION 0.25 MG/5ML
INTRAVENOUS | Status: AC
  Filled 2018-06-19: qty 5

## 2018-06-19 MED ORDER — INSULIN GLARGINE 100 UNIT/ML SOLOSTAR PEN: 10.0000 [IU] | PEN_INJECTOR | Freq: Every day | SUBCUTANEOUS | 3 refills | Status: DC

## 2018-06-19 MED ORDER — SODIUM CHLORIDE 0.9 % IV SOLN
201.8000 mg | Freq: Once | INTRAVENOUS | Status: AC
  Administered 2018-06-19: 200 mg via INTRAVENOUS
  Filled 2018-06-19: qty 20

## 2018-06-19 MED ORDER — PALONOSETRON HCL INJECTION 0.25 MG/5ML
0.2500 mg | Freq: Once | INTRAVENOUS | Status: AC
  Administered 2018-06-19: 0.25 mg via INTRAVENOUS

## 2018-06-19 MED ORDER — SODIUM CHLORIDE 0.9 % IV SOLN
20.0000 mg | Freq: Once | INTRAVENOUS | Status: AC
  Administered 2018-06-19: 20 mg via INTRAVENOUS
  Filled 2018-06-19: qty 2

## 2018-06-19 MED ORDER — METFORMIN HCL ER 500 MG PO TB24: 1000.0000 mg | ORAL_TABLET | Freq: Two times a day (BID) | ORAL | 3 refills | Status: DC

## 2018-06-19 NOTE — Progress Notes (Signed)
[facility] OFFICE PROGRESS NOTE  Patient Care Team: Patient, No Pcp Per as PCP - General (General Practice) Cordelia Poche, RN as Oncology Nurse Navigator Tish Men, MD as Medical Oncologist (Hematology)  HEME/ONC OVERVIEW: 1. Stage IIIA Travis [name]) adenocarcinoma of the right lung  -03/2018: CT CAP showed a 4.2 x 3.3 x 3.1cm RLL mass extending to RUL, additional small noncalcified nodules throughout the right lung; no metastatic disease in the abdomen/pelvis   PET showed FDG-avid RLL mass 5.3 x 3.6cm and a satellite nodule in RLL; borderline FDG-avid (SUV 2.6) paratracheal LN   MRI brain negative for mets   EBUS w/ bx of the RLL mass showed poorly differentiated adenoCa with features of squamous cell differentiation; 4R LN negative for malignancy -04/2018 - present: definitive chemoradiation with weekly carbo/Taxol  2. Port placed in 04/2018  TREATMENT REGIMEN:  05/08/2018 - present: weekly carbo/Taxol concurrent with radiation; plan for 7 cycles   PERTINENT NON-HEM/ONC PROBLEMS: 1. Hx of tobacco use, quit in early 03/2018  ASSESSMENT & PLAN:   Stage IIIA ([id number]) adenocarcinoma of the right lung  -S/p 6 cycles of weekly carbo/Taxol concurrent with radiation -Labs appropriate, proceed with Cycle 7 of chemotherapy today; plan for cycles -After completion of chemoradiation (RT to complete on 4/2/202), we will plan to repeat CT CAP in 2 weeks -If stable or improving disease, will consider maintenance durvalumab -PRN anti-medics: Zofran, Compazine, Ativan  Chemotherapy-associated anemia -Secondary to chemotherapy -Hgb 8.8, stable -Patient denies any symptom of bleeding -We will monitor for now; no indication for dose adjustment  Chemotherapy-associated leukopenia -Secondary to chemotherapy -WBC 3.3k with ANC 2200, stable -Patient denies any symptoms of infection -We will monitor for now; no indication for dose adjustment  Chemotherapy-associated nausea and  vomiting -Secondary to chemotherapy -Patient had two episodes of NBNB emesis but they resolved with Zofran  -Continue PRN-anti-emetics   Peripheral neuropathy -Secondary to underlying diabetes, mildly exacerbated by chemotherapy -Symptoms overall improving on gabapentin 600 mg twice daily -Patient understands the risk of chemotherapy-associated neuropathy, and is instructed to contact clinic if he develops worsening neuropathy, such as numbness, tingling, or  interference with ADL's  Radiation-associated esophagitis -Secondary to radiation -Pain relatively well controlled IR morphine 15mg  q6hrs PRN and viscous lidocaine swish and swallow -Continue regimen above  Poorly controlled DM -Patient had appointment with his PCP for the management of diabetes, but his appointment has been rescheduled to 08/2018 -He is concerned about running out of his diabetic medications -I have refilled his metformin today -Given his persistent hyperglycemia, I have increased his Lantus to 10 units daily  -I encouraged patient to follow-up with his PCP for further management of diabetes as scheduled  No orders of the defined types were placed in this encounter.  All questions were answered. The patient knows to call the clinic with any problems, questions or concerns. No barriers to learning was detected.  Return in 1 week for labs, port flush and clinic appt.   Tish Men, MD 06/19/2018 10:26 AM  CHIEF COMPLAINT: "I had a couple of episodes of vomiting"  INTERVAL HISTORY: Travis [name] return to clinic for follow-up of lung cancer on definitive chemoradiation.  Patient reports that he had mild nausea and 2 episodes of emesis this week.  The emesis was nonbloody and nonbilious.  He took Zofran was resolution of his nausea.  He reports intermittent abdominal fullness with occasional discomfort, but denies any persistent pain.  His bowel movement is soft and regular.  He denies  any hematochezia or melena.  He  has occasional numbness in bilateral feet, but the neuropathy is overall stable.  He denies any numbness tingling in the hands.  His glucose remains persistently elevated between 200-300s.  He was supposed to his primary care doctor this week for further management of his diabetes, but his appointment was canceled and moved to 08/2018.  He is concerned that he would be running out of his diabetic medications.  He is maintaining adequate hydration (5-8 bottles of water per day), but his appetite has not been as good due to lack of taste.  He is using viscous lidocaine swish and swallow before eating to help with the pain in the epigastric area.  He has using morphine sparingly.  He denies any other complaint.  SUMMARY OF ONCOLOGIC HISTORY:   Lung cancer ([place])   03/31/2018 Imaging    CT CAP with contrast: IMPRESSION: 1. 4.2 x 3.3 x 3.1 cm irregularly marginated mass within the superior segment of the right lower lobe appearing to extend across the fissure into the posterior inferior right upper lobe and extending toward the right hilum where the right upper lobe superior segment bronchus is occluded, consistent with primary lung carcinoma. 2. Small noncalcified nodules more numerous throughout the right lung most consistent with lung metastases. 3. No metastatic involvement of the abdomen or pelvis by CT. 4. Moderate thoracic and abdominal aortic atherosclerosis.    04/03/2018 Initial Diagnosis    Lung cancer ([place])    05/08/2018 -  Chemotherapy    The patient had palonosetron (ALOXI) injection 0.25 mg, 0.25 mg, Intravenous,  Once, 6 of 7 cycles Administration: 0.25 mg (05/08/2018), 0.25 mg (06/05/2018), 0.25 mg (06/12/2018), 0.25 mg (05/15/2018), 0.25 mg (05/22/2018), 0.25 mg (05/29/2018) CARBOplatin (PARAPLATIN) 200 mg in sodium chloride 0.9 % 250 mL chemo infusion, 200 mg (92.5 % of original dose 215.8 mg), Intravenous,  Once, 6 of 7 cycles Dose modification:   (original dose 215.8 mg, Cycle  1) Administration: 200 mg (05/08/2018), 210 mg (06/05/2018), 230 mg (06/12/2018), 210 mg (05/15/2018), 180 mg (05/22/2018), 230 mg (05/29/2018) PACLitaxel (TAXOL) 90 mg in sodium chloride 0.9 % 250 mL chemo infusion (</= 80mg /m2), 45 mg/m2 = 90 mg, Intravenous,  Once, 6 of 7 cycles Administration: 90 mg (05/08/2018), 90 mg (06/05/2018), 90 mg (06/12/2018), 90 mg (05/15/2018), 90 mg (05/22/2018), 90 mg (05/29/2018)  for chemotherapy treatment.     06/05/2018 Cancer Staging    Staging form: Lung, AJCC 8th Edition - Clinical: Stage IIIA (cT4, cN0, cM0) - Signed by Tish Men, MD on 06/05/2018     REVIEW OF SYSTEMS:   Constitutional: ( - ) fevers, ( - )  chills , ( - ) night sweats Eyes: ( - ) blurriness of vision, ( - ) double vision, ( - ) watery eyes Ears, nose, mouth, throat, and face: ( - ) mucositis, ( - ) sore throat Respiratory: ( - ) cough, ( - ) dyspnea, ( - ) wheezes Cardiovascular: ( - ) palpitation, ( - ) chest discomfort, ( - ) lower extremity swelling Gastrointestinal:  ( + ) nausea, ( + ) heartburn, ( - ) change in bowel habits Skin: ( - ) abnormal skin rashes Lymphatics: ( - ) new lymphadenopathy, ( - ) easy bruising Neurological: ( + ) numbness, ( - ) tingling, ( - ) new weaknesses Behavioral/Psych: ( - ) mood change, ( - ) new changes  All other systems were reviewed with the patient and are negative.  I have  reviewed the past medical history, past surgical history, social history and family history with the patient and they are unchanged from previous note.  ALLERGIES:  has No Known Allergies.  MEDICATIONS:  Current Outpatient Medications  Medication Sig Dispense Refill  . amitriptyline (ELAVIL) 25 MG tablet Take 50 mg by mouth at bedtime.    Marland Kitchen amLODipine (NORVASC) 5 MG tablet Take 5 mg by mouth daily.    Marland Kitchen dexamethasone (DECADRON) 4 MG tablet Take 2 tablets (8 mg total) by mouth daily. Start the day after chemotherapy for 2 days. 30 tablet 1  . diclofenac (VOLTAREN) 75 MG EC tablet  Take 75 mg by mouth 2 (two) times daily.    Marland Kitchen gabapentin (NEURONTIN) 300 MG capsule Take 2 capsules (600 mg total) by mouth 2 (two) times daily for 30 days. 120 capsule 5  . Insulin Glargine (LANTUS) 100 UNIT/ML Solostar Pen Inject 8 Units into the skin daily for 30 days. 3 mL 0  . lidocaine (XYLOCAINE) 2 % solution Patient: Mix 1part 2% viscous lidocaine, 1part H20. Swish & swallow 20mL of diluted mixture, 1min before meals and at bedtime, up to QID 100 mL 5  . lidocaine-prilocaine (EMLA) cream Apply to affected area once 30 g 3  . LORazepam (ATIVAN) 0.5 MG tablet Take 1 tablet (0.5 mg total) by mouth every 6 (six) hours as needed (Nausea or vomiting). 30 tablet 0  . magnesium oxide (MAG-OX) 400 (241.3 Mg) MG tablet Take 1 tablet (400 mg total) by mouth 2 (two) times daily for 30 days. 60 tablet 3  . metFORMIN (GLUCOPHAGE-XR) 500 MG 24 hr tablet Take 1,000 mg by mouth 2 (two) times daily.    . mometasone (ELOCON) 0.1 % cream Apply thin layer to affected areas bid prn    . morphine (MSIR) 15 MG tablet Take 1 tablet (15 mg total) by mouth every 6 (six) hours as needed for up to 30 days for severe pain. 90 tablet 0  . ondansetron (ZOFRAN) 8 MG tablet Take 1 tablet (8 mg total) by mouth 2 (two) times daily as needed for refractory nausea / vomiting. Start on day 3 after chemo. 30 tablet 1  . prochlorperazine (COMPAZINE) 10 MG tablet Take 1 tablet (10 mg total) by mouth every 6 (six) hours as needed (Nausea or vomiting). 30 tablet 1   No current facility-administered medications for this visit.    Facility-Administered Medications Ordered in Other Visits  Medication Dose Route Frequency Provider Last Rate Last Dose  . magnesium sulfate 2 g in sodium chloride 0.9 % 250 mL  2 g Intravenous Once Tish Men, MD        PHYSICAL EXAMINATION: ECOG PERFORMANCE STATUS: 1 - Symptomatic but completely ambulatory  Today's Vitals   06/19/18 1000  BP: 125/67  Pulse: (!) 118  Resp: 19  Temp: (!) 97.5 F  (36.4 C)  TempSrc: Oral  SpO2: 100%  Weight: 167 lb (75.8 kg)   Body mass index is 23.29 kg/m.  Filed Weights   06/19/18 1000  Weight: 167 lb (75.8 kg)    GENERAL: alert, no distress and comfortable SKIN: skin color, texture, turgor are normal, no rashes or significant lesions EYES: conjunctiva are pink and non-injected, sclera clear OROPHARYNX: no exudate, no erythema; lips, buccal mucosa, and tongue normal  NECK: supple, non-tender LYMPH:  no palpable lymphadenopathy in the cervical LUNGS: clear to auscultation with normal breathing effort HEART: regular rate & rhythm and no murmurs and no lower extremity edema ABDOMEN: soft, non-tender,  non-distended, normal bowel sounds Musculoskeletal: no cyanosis of digits and no clubbing  PSYCH: alert & oriented x 3, fluent speech NEURO: no focal motor/sensory deficits  LABORATORY DATA:  I have reviewed the data as listed    Component Value Date/Time   NA 137 06/12/2018 1020   K 4.3 06/12/2018 1020   CL 103 06/12/2018 1020   CO2 28 06/12/2018 1020   GLUCOSE 206 (H) 06/12/2018 1020   BUN 9 06/12/2018 1020   CREATININE 0.92 06/12/2018 1020   CALCIUM 8.8 (L) 06/12/2018 1020   PROT 5.8 (L) 06/12/2018 1020   ALBUMIN 3.8 06/12/2018 1020   AST 11 (L) 06/12/2018 1020   ALT 24 06/12/2018 1020   ALKPHOS 82 06/12/2018 1020   BILITOT 0.5 06/12/2018 1020   GFRNONAA >60 06/12/2018 1020   GFRAA >60 06/12/2018 1020    No results found for: SPEP, UPEP  Lab Results  Component Value Date   WBC 3.3 (L) 06/19/2018   NEUTROABS 2.2 06/19/2018   HGB 8.8 (L) 06/19/2018   HCT 26.9 (L) 06/19/2018   MCV 95.4 06/19/2018   PLT 291 06/19/2018      Chemistry      Component Value Date/Time   NA 137 06/12/2018 1020   K 4.3 06/12/2018 1020   CL 103 06/12/2018 1020   CO2 28 06/12/2018 1020   BUN 9 06/12/2018 1020   CREATININE 0.92 06/12/2018 1020      Component Value Date/Time   CALCIUM 8.8 (L) 06/12/2018 1020   ALKPHOS 82 06/12/2018 1020    AST 11 (L) 06/12/2018 1020   ALT 24 06/12/2018 1020   BILITOT 0.5 06/12/2018 1020

## 2018-06-19 NOTE — Patient Instructions (Signed)

## 2018-06-19 NOTE — Patient Instructions (Signed)
[facility] Discharge Instructions for Patients Receiving Chemotherapy  Today you received the following chemotherapy agents:  Taxol and Carboplatin  To help prevent nausea and vomiting after your treatment, we encourage you to take your nausea medication as ordered per MD.   If you develop nausea and vomiting that is not controlled by your nausea medication, call the clinic.   BELOW ARE SYMPTOMS THAT SHOULD BE REPORTED IMMEDIATELY:  *FEVER GREATER THAN 100.5 F  *CHILLS WITH OR WITHOUT FEVER  NAUSEA AND VOMITING THAT IS NOT CONTROLLED WITH YOUR NAUSEA MEDICATION  *UNUSUAL SHORTNESS OF BREATH  *UNUSUAL BRUISING OR BLEEDING  TENDERNESS IN MOUTH AND THROAT WITH OR WITHOUT PRESENCE OF ULCERS  *URINARY PROBLEMS  *BOWEL PROBLEMS  UNUSUAL RASH Items with * indicate a potential emergency and should be followed up as soon as possible.  Feel free to call the clinic should you have any questions or concerns. The clinic phone number is [phone].  Please show the CHEMO ALERT CARD at check-in to the Emergency Department and triage nurse.   

## 2018-06-19 NOTE — Progress Notes (Signed)
OK to treat with HR of 118 per Dr Maylon Peppers. dph

## 2018-06-22 ENCOUNTER — Other Ambulatory Visit: Payer: Self-pay

## 2018-06-22 ENCOUNTER — Ambulatory Visit
Admission: RE | Admit: 2018-06-22 | Discharge: 2018-06-22 | Disposition: A | Discharge status: Home / Self Care | Payer: BLUE CROSS/BLUE SHIELD | Source: Ambulatory Visit | Attending: Radiation Oncology | Admitting: Radiation Oncology

## 2018-06-22 DIAGNOSIS — Z5111 Encounter for antineoplastic chemotherapy: Secondary | ICD-10-CM | POA: Diagnosis not present

## 2018-06-23 ENCOUNTER — Other Ambulatory Visit: Payer: Self-pay

## 2018-06-23 ENCOUNTER — Ambulatory Visit
Admission: RE | Admit: 2018-06-23 | Discharge: 2018-06-23 | Disposition: A | Discharge status: Home / Self Care | Payer: BLUE CROSS/BLUE SHIELD | Source: Ambulatory Visit | Attending: Radiation Oncology | Admitting: Radiation Oncology

## 2018-06-23 DIAGNOSIS — Z5111 Encounter for antineoplastic chemotherapy: Secondary | ICD-10-CM | POA: Diagnosis not present

## 2018-06-24 ENCOUNTER — Other Ambulatory Visit: Payer: Self-pay

## 2018-06-24 ENCOUNTER — Ambulatory Visit
Admission: RE | Admit: 2018-06-24 | Discharge: 2018-06-24 | Disposition: A | Discharge status: Home / Self Care | Payer: BLUE CROSS/BLUE SHIELD | Source: Ambulatory Visit | Attending: Radiation Oncology | Admitting: Radiation Oncology

## 2018-06-24 DIAGNOSIS — Z87891 Personal history of nicotine dependence: Secondary | ICD-10-CM | POA: Diagnosis not present

## 2018-06-24 DIAGNOSIS — C3431 Malignant neoplasm of lower lobe, right bronchus or lung: Secondary | ICD-10-CM | POA: Insufficient documentation

## 2018-06-24 DIAGNOSIS — T451X5A Adverse effect of antineoplastic and immunosuppressive drugs, initial encounter: Secondary | ICD-10-CM | POA: Diagnosis not present

## 2018-06-24 DIAGNOSIS — D6481 Anemia due to antineoplastic chemotherapy: Secondary | ICD-10-CM | POA: Insufficient documentation

## 2018-06-24 DIAGNOSIS — Z5111 Encounter for antineoplastic chemotherapy: Secondary | ICD-10-CM | POA: Diagnosis not present

## 2018-06-24 DIAGNOSIS — D701 Agranulocytosis secondary to cancer chemotherapy: Secondary | ICD-10-CM | POA: Diagnosis not present

## 2018-06-24 DIAGNOSIS — Z510 Encounter for antineoplastic radiation therapy: Secondary | ICD-10-CM | POA: Diagnosis present

## 2018-06-25 ENCOUNTER — Other Ambulatory Visit: Payer: Self-pay | Admitting: Radiation Oncology

## 2018-06-25 ENCOUNTER — Other Ambulatory Visit: Payer: Self-pay

## 2018-06-25 ENCOUNTER — Ambulatory Visit
Admission: RE | Admit: 2018-06-25 | Discharge: 2018-06-25 | Disposition: A | Discharge status: Home / Self Care | Payer: BLUE CROSS/BLUE SHIELD | Source: Ambulatory Visit | Attending: Radiation Oncology | Admitting: Radiation Oncology

## 2018-06-25 ENCOUNTER — Encounter: Payer: Self-pay | Admitting: Radiation Oncology

## 2018-06-25 DIAGNOSIS — Z5111 Encounter for antineoplastic chemotherapy: Secondary | ICD-10-CM | POA: Diagnosis not present

## 2018-06-25 MED ORDER — SUCRALFATE 1 G PO TABS: 1.0000 g | ORAL_TABLET | Freq: Four times a day (QID) | ORAL | 2 refills | Status: DC

## 2018-06-25 NOTE — Progress Notes (Signed)
Travis [name] OFFICE PROGRESS NOTE  Patient Care Team: Patient, No Pcp Per as PCP - General (General Practice) Travis Poche, RN as Oncology Nurse Navigator Travis Men, MD as Medical Oncologist (Hematology)  HEME/ONC OVERVIEW: 1. Stage IIIA [name]) adenocarcinoma of the right lung  -03/2018: CT CAP showed a 4.2 x 3.3 x 3.1cm RLL mass extending to RUL, additional small noncalcified nodules throughout the right lung; no metastatic disease in the abdomen/pelvis   PET showed FDG-avid RLL mass 5.3 x 3.6cm and a satellite nodule in RLL; borderline FDG-avid (SUV 2.6) paratracheal LN   MRI brain negative for mets   EBUS w/ bx of the RLL mass showed poorly differentiated adenoCa with features of squamous cell differentiation; 4R LN negative for malignancy -04/2018 - 06/2018: definitive chemoradiation with weekly carbo/Taxol  2. Port placed in 04/2018  TREATMENT REGIMEN:  05/08/2018 - 06/25/2018: weekly carbo/Taxol concurrent with radiation; plan for 7 cycles   PERTINENT NON-HEM/ONC PROBLEMS: 1. Hx of tobacco use, quit in early 03/2018  ASSESSMENT & PLAN:   Stage IIIA ([id number]) adenocarcinoma of the right lung  -S/p definitive chemoradiation with weekly carbo/Taxol, RT completed on 06/25/2018  -I have ordered CT CAP in mid-06/2018 to assess response -If stable or improving disease, he would be a good candidate for maintenance durvalumab -PRN anti-emetics: Zofran, Compazine, and Ativan   Chemotherapy-associated anemia -Secondary to chemotherapy -Hgb 9.3, stable -Patient denies any symptom of bleeding -We will monitor for now  Chemotherapy-associated nausea  -Secondary to chemotherapy -Patient had several episodes of non-bloody/non-bilious emesis after the last dose of chemotherapy, but the emesis has resolved with anti-emetics  -Continue PRN-anti-emetics as prescribed   Peripheral neuropathy -Secondary to underlying diabetes, exacerbated by chemotherapy -Symptoms  overall improving on gabapentin 600 mg BID -We will monitor for now   Oral candidiasis -Exam showed new oral candidiasis -I have prescribed fluconazole daily x 14 days   Radiation-associated esophagitis -Secondary to radiation -Pain relatively well controlled with IR morphine 15mg  q6hrs PRN and viscous lidocaine swish/swallow -Continue the regimen above  Poorly controlled DM -Patient is currently on metformin and Lantus 10 units daily -Glucose still in the 300's at home; glucose 188 this afternoon -Given the persistent hyperglycemia, I have increased his glargine to 13 units qhs -As the patient's PCP has been rescheduling his follow-up appt to 08/2018 and he cannot be seen sooner, I have also placed a referral to endocrinology for further management of his poorly controlled diabetes   Orders Placed This Encounter  Procedures  . CT CHEST W CONTRAST    Standing Status:   Future    Standing Expiration Date:   06/26/2019    Order Specific Question:   If indicated for the ordered procedure, I authorize the administration of contrast media per Radiology protocol    Answer:   Yes    Order Specific Question:   Preferred imaging location?    Answer:   [facility] Specific Question:   Radiology Contrast Protocol - do NOT remove file path    Answer:   \\[email or web address].pdf  . CT ABDOMEN PELVIS W CONTRAST    Standing Status:   Future    Standing Expiration Date:   06/26/2019    Order Specific Question:   If indicated for the ordered procedure, I authorize the administration of contrast media per Radiology protocol    Answer:   Yes    Order Specific Question:   Preferred imaging location?    Answer:  [facility]    Order Specific Question:   Is Oral Contrast requested for this exam?    Answer:   Yes, Per Radiology protocol    Order Specific Question:   Radiology Contrast Protocol - do NOT remove file path    Answer:    \\[email or web address].pdf  . TSH    Standing Status:   Future    Standing Expiration Date:   06/26/2019  . T4, free    Standing Status:   Future    Standing Expiration Date:   06/26/2019  . Ambulatory referral to Endocrinology    Referral Priority:   Urgent    Referral Type:   Consultation    Referral Reason:   Specialty Services Required    Number of Visits Requested:   1    All questions were answered. The patient knows to call the clinic with any problems, questions or concerns. No barriers to learning was detected.  Return in 2 weeks for labs, port flush, imaging results and clinic follow-up.   Travis Men, MD 06/26/2018 4:14 PM  CHIEF COMPLAINT: "I am doing okay"  INTERVAL HISTORY: Travis [name] returns to clinic for follow-up of lung cancer s/p definitive chemoradiation.  Patient reports that after the last dose of chemotherapy, he developed new onset nausea and emesis from Friday through Sunday, several episodes per day, nonbloody/nonbilious.  The antiemetics initially did not work very well, but after 2 days, the nausea and vomiting subsided.  He has a sensation of food being stuck in the epigastric area, for which he has to drink water to flush it down.  He has been trying to sleep in the inclined position, which appears to help with the sensation of dysphasia.  Starting this week, he also noticed some white patches on his tongue.  He still has some pain with swallowing, for which she takes IR morphine once every 2 to 3 days with adequate pain relief.  The neuropathy in his hands and feet is overall unchanged.  He denies any other complaint today.  SUMMARY OF ONCOLOGIC HISTORY:   Lung cancer ([place])   03/31/2018 Imaging    CT CAP with contrast: IMPRESSION: 1. 4.2 x 3.3 x 3.1 cm irregularly marginated mass within the superior segment of the right lower lobe appearing to extend across the fissure into the posterior inferior right upper lobe and extending toward the  right hilum where the right upper lobe superior segment bronchus is occluded, consistent with primary lung carcinoma. 2. Small noncalcified nodules more numerous throughout the right lung most consistent with lung metastases. 3. No metastatic involvement of the abdomen or pelvis by CT. 4. Moderate thoracic and abdominal aortic atherosclerosis.    04/03/2018 Initial Diagnosis    Lung cancer ([place])    05/08/2018 -  Chemotherapy    The patient had palonosetron (ALOXI) injection 0.25 mg, 0.25 mg, Intravenous,  Once, 7 of 7 cycles Administration: 0.25 mg (05/08/2018), 0.25 mg (06/05/2018), 0.25 mg (06/12/2018), 0.25 mg (05/15/2018), 0.25 mg (06/19/2018), 0.25 mg (05/22/2018), 0.25 mg (05/29/2018) CARBOplatin (PARAPLATIN) 200 mg in sodium chloride 0.9 % 250 mL chemo infusion, 200 mg (92.5 % of original dose 215.8 mg), Intravenous,  Once, 7 of 7 cycles Dose modification:   (original dose 215.8 mg, Cycle 1) Administration: 200 mg (05/08/2018), 210 mg (06/05/2018), 230 mg (06/12/2018), 210 mg (05/15/2018), 200 mg (06/19/2018), 180 mg (05/22/2018), 230 mg (05/29/2018) PACLitaxel (TAXOL) 90 mg in sodium chloride 0.9 % 250 mL chemo infusion (</= 80mg /m2), 45 mg/m2 = 90  mg, Intravenous,  Once, 7 of 7 cycles Administration: 90 mg (05/08/2018), 90 mg (06/05/2018), 90 mg (06/12/2018), 90 mg (05/15/2018), 90 mg (06/19/2018), 90 mg (05/22/2018), 90 mg (05/29/2018)  for chemotherapy treatment.     06/05/2018 Cancer Staging    Staging form: Lung, AJCC 8th Edition - Clinical: Stage IIIA (cT4, cN0, cM0) - Signed by Travis Men, MD on 06/05/2018     REVIEW OF SYSTEMS:   Constitutional: ( - ) fevers, ( - )  chills , ( - ) night sweats Eyes: ( - ) blurriness of vision, ( - ) double vision, ( - ) watery eyes Ears, nose, mouth, throat, and face: ( - ) mucositis, ( + ) sore throat Respiratory: ( - ) cough, ( - ) dyspnea, ( - ) wheezes Cardiovascular: ( - ) palpitation, ( - ) chest discomfort, ( - ) lower extremity swelling Gastrointestinal:   ( + ) nausea, ( - ) heartburn, ( - ) change in bowel habits Skin: ( - ) abnormal skin rashes Lymphatics: ( - ) new lymphadenopathy, ( - ) easy bruising Neurological: ( - ) numbness, ( + ) tingling, ( - ) new weaknesses Behavioral/Psych: ( - ) mood change, ( - ) new changes  All other systems were reviewed with the patient and are negative.  I have reviewed the past medical history, past surgical history, social history and family history with the patient and they are unchanged from previous note.  ALLERGIES:  has No Known Allergies.  MEDICATIONS:  Current Outpatient Medications  Medication Sig Dispense Refill  . amitriptyline (ELAVIL) 25 MG tablet Take 50 mg by mouth at bedtime.    Marland Kitchen amLODipine (NORVASC) 5 MG tablet Take 5 mg by mouth daily.    . diclofenac (VOLTAREN) 75 MG EC tablet Take 75 mg by mouth 2 (two) times daily.    . fluconazole (DIFLUCAN) 100 MG tablet Take 2 tabs on the first day, and then one tab daily 15 tablet 0  . gabapentin (NEURONTIN) 300 MG capsule Take 2 capsules (600 mg total) by mouth 2 (two) times daily for 30 days. 120 capsule 5  . Insulin Glargine (LANTUS) 100 UNIT/ML Solostar Pen Inject 13 Units into the skin at bedtime for 30 days. 3 mL 3  . lidocaine (XYLOCAINE) 2 % solution Patient: Mix 1part 2% viscous lidocaine, 1part H20. Swish & swallow 71mL of diluted mixture, 67min before meals and at bedtime, up to QID 100 mL 5  . lidocaine-prilocaine (EMLA) cream Apply to affected area once 30 g 3  . magnesium oxide (MAG-OX) 400 (241.3 Mg) MG tablet Take 1 tablet (400 mg total) by mouth 2 (two) times daily for 30 days. 60 tablet 3  . metFORMIN (GLUCOPHAGE-XR) 500 MG 24 hr tablet Take 2 tablets (1,000 mg total) by mouth 2 (two) times daily. 360 tablet 3  . mometasone (ELOCON) 0.1 % cream Apply thin layer to affected areas bid prn    . morphine (MSIR) 15 MG tablet Take 1 tablet (15 mg total) by mouth every 6 (six) hours as needed for up to 30 days for severe pain. 90  tablet 0  . ondansetron (ZOFRAN) 8 MG tablet Take 1 tablet (8 mg total) by mouth 2 (two) times daily as needed for refractory nausea / vomiting. Start on day 3 after chemo. 30 tablet 1  . prochlorperazine (COMPAZINE) 10 MG tablet Take 1 tablet (10 mg total) by mouth every 6 (six) hours as needed (Nausea or vomiting). 30 tablet 1  .  sucralfate (CARAFATE) 1 g tablet Take 1 tablet (1 g total) by mouth 4 (four) times daily. 120 tablet 2   No current facility-administered medications for this visit.    Facility-Administered Medications Ordered in Other Visits  Medication Dose Route Frequency Provider Last Rate Last Dose  . magnesium sulfate 2 g in sodium chloride 0.9 % 250 mL  2 g Intravenous Once Travis Men, MD        PHYSICAL EXAMINATION: ECOG PERFORMANCE STATUS: 1 - Symptomatic but completely ambulatory  Today's Vitals   06/26/18 1549  BP: (P) 126/78  Pulse: (!) (P) 118  Resp: (!) (P) 21  TempSrc: (P) Oral  SpO2: (P) 100%  Weight: (P) 167 lb (75.8 kg)  Height: (P) 5\' 11"  (1.803 m)  PainSc: (P) 0-No pain   Body mass index is 23.29 kg/m (pended).  Filed Weights   06/26/18 1549  Weight: (P) 167 lb (75.8 kg)    GENERAL: alert, no distress and comfortable SKIN: skin color, texture, turgor are normal, no rashes or significant lesions EYES: conjunctiva are pink and non-injected, sclera clear OROPHARYNX: white patches over the tongue, consistent w/ oral candidiasis  NECK: supple, non-tender LYMPH:  no palpable lymphadenopathy in the cervical LUNGS: clear to auscultation with normal breathing effort HEART: regular rate & rhythm and no murmurs and no lower extremity edema ABDOMEN: soft, non-tender, non-distended, normal bowel sounds Musculoskeletal: no cyanosis of digits and no clubbing  PSYCH: alert & oriented x 3, fluent speech NEURO: no focal motor/sensory deficits  LABORATORY DATA:  I have reviewed the data as listed    Component Value Date/Time   NA 137 06/26/2018 1520   K  4.5 06/26/2018 1520   CL 102 06/26/2018 1520   CO2 24 06/26/2018 1520   GLUCOSE 188 (H) 06/26/2018 1520   BUN 25 (H) 06/26/2018 1520   CREATININE 1.25 (H) 06/26/2018 1520   CALCIUM 9.9 06/26/2018 1520   PROT 6.9 06/26/2018 1520   ALBUMIN 4.2 06/26/2018 1520   AST 11 (L) 06/26/2018 1520   ALT 19 06/26/2018 1520   ALKPHOS 98 06/26/2018 1520   BILITOT 0.4 06/26/2018 1520   GFRNONAA >60 06/26/2018 1520   GFRAA >60 06/26/2018 1520    No results found for: SPEP, UPEP  Lab Results  Component Value Date   WBC 6.6 06/26/2018   NEUTROABS 4.9 06/26/2018   HGB 9.3 (L) 06/26/2018   HCT 27.6 (L) 06/26/2018   MCV 93.6 06/26/2018   PLT 275 06/26/2018      Chemistry      Component Value Date/Time   NA 137 06/26/2018 1520   K 4.5 06/26/2018 1520   CL 102 06/26/2018 1520   CO2 24 06/26/2018 1520   BUN 25 (H) 06/26/2018 1520   CREATININE 1.25 (H) 06/26/2018 1520      Component Value Date/Time   CALCIUM 9.9 06/26/2018 1520   ALKPHOS 98 06/26/2018 1520   AST 11 (L) 06/26/2018 1520   ALT 19 06/26/2018 1520   BILITOT 0.4 06/26/2018 1520

## 2018-06-26 ENCOUNTER — Inpatient Hospital Stay: Payer: BLUE CROSS/BLUE SHIELD

## 2018-06-26 ENCOUNTER — Telehealth: Payer: Self-pay | Admitting: *Deleted

## 2018-06-26 ENCOUNTER — Encounter: Payer: Self-pay | Admitting: Hematology

## 2018-06-26 ENCOUNTER — Inpatient Hospital Stay: Payer: BLUE CROSS/BLUE SHIELD | Attending: Hematology | Admitting: Hematology

## 2018-06-26 ENCOUNTER — Other Ambulatory Visit: Payer: Self-pay

## 2018-06-26 ENCOUNTER — Other Ambulatory Visit: Payer: BLUE CROSS/BLUE SHIELD

## 2018-06-26 DIAGNOSIS — C7801 Secondary malignant neoplasm of right lung: Secondary | ICD-10-CM

## 2018-06-26 DIAGNOSIS — Z923 Personal history of irradiation: Secondary | ICD-10-CM | POA: Diagnosis not present

## 2018-06-26 DIAGNOSIS — K208 Other esophagitis without bleeding: Secondary | ICD-10-CM

## 2018-06-26 DIAGNOSIS — T451X5A Adverse effect of antineoplastic and immunosuppressive drugs, initial encounter: Secondary | ICD-10-CM | POA: Diagnosis not present

## 2018-06-26 DIAGNOSIS — Z794 Long term (current) use of insulin: Secondary | ICD-10-CM | POA: Insufficient documentation

## 2018-06-26 DIAGNOSIS — E1142 Type 2 diabetes mellitus with diabetic polyneuropathy: Secondary | ICD-10-CM

## 2018-06-26 DIAGNOSIS — C3431 Malignant neoplasm of lower lobe, right bronchus or lung: Secondary | ICD-10-CM

## 2018-06-26 DIAGNOSIS — Z9221 Personal history of antineoplastic chemotherapy: Secondary | ICD-10-CM | POA: Diagnosis not present

## 2018-06-26 DIAGNOSIS — Z79899 Other long term (current) drug therapy: Secondary | ICD-10-CM | POA: Diagnosis not present

## 2018-06-26 DIAGNOSIS — R0609 Other forms of dyspnea: Secondary | ICD-10-CM | POA: Insufficient documentation

## 2018-06-26 DIAGNOSIS — E1165 Type 2 diabetes mellitus with hyperglycemia: Secondary | ICD-10-CM

## 2018-06-26 DIAGNOSIS — D6481 Anemia due to antineoplastic chemotherapy: Secondary | ICD-10-CM

## 2018-06-26 DIAGNOSIS — R110 Nausea: Secondary | ICD-10-CM

## 2018-06-26 DIAGNOSIS — R55 Syncope and collapse: Secondary | ICD-10-CM | POA: Insufficient documentation

## 2018-06-26 DIAGNOSIS — B370 Candidal stomatitis: Secondary | ICD-10-CM | POA: Diagnosis not present

## 2018-06-26 DIAGNOSIS — R32 Unspecified urinary incontinence: Secondary | ICD-10-CM | POA: Diagnosis not present

## 2018-06-26 DIAGNOSIS — C801 Malignant (primary) neoplasm, unspecified: Secondary | ICD-10-CM

## 2018-06-26 DIAGNOSIS — T66XXXA Radiation sickness, unspecified, initial encounter: Secondary | ICD-10-CM

## 2018-06-26 LAB — MAGNESIUM: Magnesium: 2 mg/dL (ref 1.7–2.4)

## 2018-06-26 LAB — CBC WITH DIFFERENTIAL (CANCER CENTER ONLY)
Abs Immature Granulocytes: 0.1 10*3/uL — ABNORMAL HIGH (ref 0.00–0.07)
Basophils Absolute: 0 10*3/uL (ref 0.0–0.1)
Basophils Relative: 0 %
Eosinophils Absolute: 0 10*3/uL (ref 0.0–0.5)
Eosinophils Relative: 0 %
HCT: 27.6 % — ABNORMAL LOW (ref 39.0–52.0)
Hemoglobin: 9.3 g/dL — ABNORMAL LOW (ref 13.0–17.0)
Immature Granulocytes: 2 %
Lymphocytes Relative: 11 %
Lymphs Abs: 0.7 10*3/uL (ref 0.7–4.0)
MCH: 31.5 pg (ref 26.0–34.0)
MCHC: 33.7 g/dL (ref 30.0–36.0)
MCV: 93.6 fL (ref 80.0–100.0)
Monocytes Absolute: 0.8 10*3/uL (ref 0.1–1.0)
Monocytes Relative: 13 %
Neutro Abs: 4.9 10*3/uL (ref 1.7–7.7)
Neutrophils Relative %: 74 %
Platelet Count: 275 10*3/uL (ref 150–400)
RBC: 2.95 MIL/uL — ABNORMAL LOW (ref 4.22–5.81)
RDW: 14.3 % (ref 11.5–15.5)
WBC Count: 6.6 10*3/uL (ref 4.0–10.5)
nRBC: 1.8 % — ABNORMAL HIGH (ref 0.0–0.2)

## 2018-06-26 LAB — CMP (CANCER CENTER ONLY)
ALT: 19 U/L (ref 0–44)
AST: 11 U/L — ABNORMAL LOW (ref 15–41)
Albumin: 4.2 g/dL (ref 3.5–5.0)
Alkaline Phosphatase: 98 U/L (ref 38–126)
Anion gap: 11 (ref 5–15)
BUN: 25 mg/dL — ABNORMAL HIGH (ref 8–23)
CO2: 24 mmol/L (ref 22–32)
Calcium: 9.9 mg/dL (ref 8.9–10.3)
Chloride: 102 mmol/L (ref 98–111)
Creatinine: 1.25 mg/dL — ABNORMAL HIGH (ref 0.61–1.24)
GFR, Est AFR Am: >60 mL/min (ref >60)
GFR, Estimated: >60 mL/min (ref >60)
Glucose, Bld: 188 mg/dL — ABNORMAL HIGH (ref 70–99)
Potassium: 4.5 mmol/L (ref 3.5–5.1)
Sodium: 137 mmol/L (ref 135–145)
Total Bilirubin: 0.4 mg/dL (ref 0.3–1.2)
Total Protein: 6.9 g/dL (ref 6.5–8.1)

## 2018-06-26 MED ORDER — FLUCONAZOLE 100 MG PO TABS: ORAL_TABLET | ORAL | 0 refills | Status: DC

## 2018-06-26 MED ORDER — INSULIN GLARGINE 100 UNIT/ML SOLOSTAR PEN: 13.0000 [IU] | PEN_INJECTOR | Freq: Every day | SUBCUTANEOUS | 3 refills | Status: DC

## 2018-06-26 NOTE — Telephone Encounter (Signed)
Called Endocrinology regarding new pt referral. Office has closed early. Message left for referral coordinator for pt appt.

## 2018-06-29 ENCOUNTER — Encounter: Payer: Self-pay | Admitting: *Deleted

## 2018-06-29 ENCOUNTER — Other Ambulatory Visit: Payer: Self-pay | Admitting: *Deleted

## 2018-06-29 MED ORDER — SUCRALFATE 1 G PO TABS: 1.0000 g | ORAL_TABLET | Freq: Four times a day (QID) | ORAL | 2 refills | Status: DC

## 2018-06-29 NOTE — Progress Notes (Signed)
Spoke with patient regarding his completion of his chemo/radiation. He has continued issues with esophageal pain and discomfort, similar to those spoke about during his appointment on Friday. Reviewed all his medications and instructions for use. He states [facility] never got the Carafate prescription. Prescription re-sent. He also c/o of continue waking during the night with vomiting. Encouraged patient to sleep in elevated position to prevent collection of secretions. He will try this.  Reviewed CT scan directions and appointment with patient. He knows he needs to go into the radiology location this week for contrast and instructions.

## 2018-07-01 ENCOUNTER — Ambulatory Visit (INDEPENDENT_AMBULATORY_CARE_PROVIDER_SITE_OTHER): Payer: BLUE CROSS/BLUE SHIELD | Admitting: Internal Medicine

## 2018-07-01 ENCOUNTER — Encounter: Payer: Self-pay | Admitting: Internal Medicine

## 2018-07-01 DIAGNOSIS — E1165 Type 2 diabetes mellitus with hyperglycemia: Secondary | ICD-10-CM | POA: Diagnosis not present

## 2018-07-01 DIAGNOSIS — Z794 Long term (current) use of insulin: Secondary | ICD-10-CM | POA: Diagnosis not present

## 2018-07-01 DIAGNOSIS — E1142 Type 2 diabetes mellitus with diabetic polyneuropathy: Secondary | ICD-10-CM | POA: Diagnosis not present

## 2018-07-01 MED ORDER — GLUCOSE BLOOD VI STRP: ORAL_STRIP | 12 refills | Status: DC

## 2018-07-01 MED ORDER — INSULIN GLARGINE 100 UNIT/ML SOLOSTAR PEN: 19.0000 [IU] | PEN_INJECTOR | Freq: Every day | SUBCUTANEOUS | 3 refills | Status: DC

## 2018-07-01 MED ORDER — INSULIN PEN NEEDLE 32G X 4 MM MISC: 6 refills | Status: DC

## 2018-07-01 MED ORDER — INSULIN ASPART 100 UNIT/ML FLEXPEN: 6.0000 [IU] | PEN_INJECTOR | Freq: Three times a day (TID) | SUBCUTANEOUS | 11 refills | Status: DC

## 2018-07-01 NOTE — Progress Notes (Signed)
Virtual Visit via Video Note  I connected with Travis [name] on 07/01/18 at  by a video enabled telemedicine application and verified that I am speaking with the correct person using two identifiers.   I discussed the limitations of evaluation and management by telemedicine and the availability of in person appointments. The patient expressed understanding and agreed to proceed.   -Location of the patient : Home  -Location of the provider : Office -The names of all persons participating in the telemedicine service : [place]          Name: Travis [name]  MRN/ DOB: [id number], September 10, 1955   Age/ Sex: 63 y.o., male    PCP: Travis Ra, PA-C   Reason for Endocrinology Evaluation: Type 2 Diabetes Mellitus     Date of Initial Endocrinology Visit: 07/01/2018     PATIENT IDENTIFIER: [name] is a 63 y.o. male with a past medical history of T2DM, stage III adenocarcinoma of the lung (S/P chemo. The patient presented for initial endocrinology clinic visit on 07/01/2018 for consultative assistance with his diabetes management.    HPI: Travis [name] was    Diagnosed with T2DM 6 yrs ago Prior Medications tried/Intolerance:None  Glucose checks : 2x per day  Hypoglycemia episodes : 0    Hemoglobin A1c: no records available  Patient required assistance for hypoglycemia: no Patient has required hospitalization within the last 1 year from hyper or hypoglycemia: no  In terms of diet, the patient does drink cool-aid and snacks through the day   HOME DIABETES REGIMEN: Lantus 13 units daily  Metformin 500 mg XR two tablets BID    Statin: no ACE-I/ARB: No Prior Diabetic Education: no  GLUCOSE LOG:  Date Fasting Bedtime  07/01/2018 357   4/7 378 327  4/6 445   4/5 363 399    DIABETIC COMPLICATIONS: Microvascular complications:   Neuropathy  Denies: CKD, retinopathy  Last eye exam: Completed 2019  Macrovascular complications:   Denies: CAD, PVD, CVA    PAST HISTORY: Past Medical History:  Past Medical History:  Diagnosis Date  . Arthritis   . Cancer ([place])    lung  . Chronic kidney disease   . Diabetes mellitus    TYPE II  . Dyspnea    at times  . Hypertension   . Pneumonia    "years ago"   Past Surgical History:  Past Surgical History:  Procedure Laterality Date  . COLONOSCOPY W/ POLYPECTOMY    . IR IMAGING GUIDED PORT INSERTION  05/05/2018  . NASAL FRACTURE SURGERY    . VIDEO BRONCHOSCOPY WITH ENDOBRONCHIAL ULTRASOUND Right 04/20/2018   Procedure: VIDEO BRONCHOSCOPY WITH ENDOBRONCHIAL ULTRASOUND;  Surgeon: Juanito Doom, MD;  Location: [facility];  Service: Thoracic;  Laterality: Right;      Social History:  reports that he quit smoking about 3 months ago. His smoking use included cigarettes. He has a 45.00 pack-year smoking history. He quit smokeless tobacco use about 45 years ago.  His smokeless tobacco use included chew. He reports current alcohol use of about 2.0 standard drinks of alcohol per week. He reports that he does not use drugs. Family History:  Family History  Problem Relation Age of Onset  . Leukemia Sister   . Cancer Brother   . Diabetes Mother      HOME MEDICATIONS: Allergies as of 07/01/2018   No Known Allergies     Medication List       Accurate as of July 01, 2018  4:20 PM. Always use your most recent med list.        amitriptyline 25 MG tablet Commonly known as:  ELAVIL Take 50 mg by mouth at bedtime.   amLODipine 5 MG tablet Commonly known as:  NORVASC Take 5 mg by mouth daily.   diclofenac 75 MG EC tablet Commonly known as:  VOLTAREN Take 75 mg by mouth 2 (two) times daily.   fluconazole 100 MG tablet Commonly known as:  DIFLUCAN Take 2 tabs on the first day, and then one tab daily   gabapentin 300 MG capsule Commonly known as:  NEURONTIN Take 2 capsules (600 mg total) by mouth 2 (two) times daily for 30 days.   glucose blood test strip 4x a day   insulin aspart 100  UNIT/ML FlexPen Commonly known as:  NovoLOG FlexPen Inject 6 Units into the skin 3 (three) times daily with meals.   Insulin Glargine 100 UNIT/ML Solostar Pen Commonly known as:  LANTUS Inject 19 Units into the skin at bedtime for 30 days.   Insulin Pen Needle 32G X 4 MM Misc Commonly known as:  BD Pen Needle Nano U/F 4x daily   lidocaine 2 % solution Commonly known as:  XYLOCAINE Patient: Mix 1part 2% viscous lidocaine, 1part H20. Swish & swallow 63mL of diluted mixture, 1min before meals and at bedtime, up to QID   lidocaine-prilocaine cream Commonly known as:  EMLA Apply to affected area once   magnesium oxide 400 (241.3 Mg) MG tablet Commonly known as:  MAG-OX Take 1 tablet (400 mg total) by mouth 2 (two) times daily for 30 days.   metFORMIN 500 MG 24 hr tablet Commonly known as:  GLUCOPHAGE-XR Take 2 tablets (1,000 mg total) by mouth 2 (two) times daily.   mometasone 0.1 % cream Commonly known as:  ELOCON Apply thin layer to affected areas bid prn   morphine 15 MG tablet Commonly known as:  MSIR Take 1 tablet (15 mg total) by mouth every 6 (six) hours as needed for up to 30 days for severe pain.   ondansetron 8 MG tablet Commonly known as:  Zofran Take 1 tablet (8 mg total) by mouth 2 (two) times daily as needed for refractory nausea / vomiting. Start on day 3 after chemo.   prochlorperazine 10 MG tablet Commonly known as:  COMPAZINE Take 1 tablet (10 mg total) by mouth every 6 (six) hours as needed (Nausea or vomiting).   sucralfate 1 g tablet Commonly known as:  Carafate Take 1 tablet (1 g total) by mouth 4 (four) times daily.        ALLERGIES: No Known Allergies   REVIEW OF SYSTEMS: A comprehensive ROS was conducted with the patient and is negative except as per HPI and below:  Review of Systems  Constitutional: Negative for fever.  HENT: Negative for congestion and sore throat.   Eyes: Positive for blurred vision. Negative for pain.   Respiratory: Positive for cough and shortness of breath.   Gastrointestinal: Negative for diarrhea and nausea.  Genitourinary: Positive for frequency.  Neurological: Positive for tingling. Negative for tremors.  Endo/Heme/Allergies: Positive for polydipsia.    DATA REVIEWED:  No results found for: HGBA1C Lab Results  Component Value Date   CREATININE 1.25 (H) 06/26/2018    ASSESSMENT / PLAN / RECOMMENDATIONS:   1) Type 2 Diabetes Mellitus, Poorly controlled, With neuropathic  complications -   Plan: GENERAL:  I don't have an A1c records on him but his BG's have been >  300 mg/dL.   He recently finished chemotherapy and radiation for stage III lung cancer (3/27) I have discussed with the patient the pathophysiology of diabetes. We went over the natural progression of the disease. We talked about both insulin resistance and insulin deficiency. We stressed the importance of lifestyle changes including diet and exercise. I explained the complications associated with diabetes including retinopathy, nephropathy, neuropathy as well as increased risk of cardiovascular disease. We went over the benefit seen with glycemic control.   We discussed the importance of glucose control in helping his body heal from the effects of chemo/radiation  We also discussed the importance of dietary discretions and encouraged him to eat 3 proper meals a day, we also discussed that snacking all day long is not conducive to optimal glucose control  I have advised him to avoid sugar-sweetened beverages and he may use diet drinks if necessary    MEDICATIONS:  Increase Lantus to 19 units QHS  Start Novolog 6 unist TID QAC  Continue Metformin 500 mg 2 tablets BID   EDUCATION / INSTRUCTIONS:  BG monitoring instructions: Patient is instructed to check his blood sugars 3-4 times a day, before meals and bedtime.  Call [facility] if: BG persistently < 70 or > 300. . I reviewed the Rule of  15 for the treatment of hypoglycemia in detail with the patient. Literature supplied.   2) Diabetic complications:   Eye: Does not have known diabetic retinopathy.   Neuro/ Feet: Does have known diabetic peripheral neuropathy.  Renal: Patient does not have known baseline CKD. He is not on an ACEI/ARB at present.   3) Lipids: Patient is not on a statin. I don't have a baseline lipid on him.    4) Hypertension: This has been under control in the past,  at goal of < 140/90 mmHg.   I discussed the assessment and treatment plan with the patient. The patient was provided an opportunity to ask questions and all were answered. The patient agreed with the plan and demonstrated an understanding of the instructions.   The patient was advised to call back or seek an in-person evaluation if the symptoms worsen or if the condition fails to improve as anticipated.  The last 5 minutes of the visit was switched to phone, due to sound issues and his inability to hear me well.   F/u in 4 weeks.    Signed electronically by: Mack Guise, MD  [facility]  [facility] [address] [place], [place] [address] Phone: [phone] FAX: [phone]   CC: [address] [place] [address] Phone: [phone]  Fax: [phone]    Return to Endocrinology clinic as below: Future Appointments  Date Time Provider [place]  07/09/2018  1:30 PM [facility]  07/09/2018  2:00 PM [facility] 1 [facility]  07/09/2018  3:00 PM [facility] LAB [facility] None  07/09/2018  3:15 PM [facility] INJ NURSE [facility] None  07/09/2018  3:30 PM Tish Men, MD [facility] None

## 2018-07-02 ENCOUNTER — Encounter (HOSPITAL_BASED_OUTPATIENT_CLINIC_OR_DEPARTMENT_OTHER): Payer: Self-pay

## 2018-07-02 ENCOUNTER — Other Ambulatory Visit: Payer: Self-pay

## 2018-07-02 ENCOUNTER — Emergency Department (HOSPITAL_BASED_OUTPATIENT_CLINIC_OR_DEPARTMENT_OTHER): Payer: BLUE CROSS/BLUE SHIELD

## 2018-07-02 ENCOUNTER — Emergency Department (HOSPITAL_BASED_OUTPATIENT_CLINIC_OR_DEPARTMENT_OTHER)
Admission: EM | Admit: 2018-07-02 | Discharge: 2018-07-02 | Disposition: A | Discharge status: Home / Self Care | Payer: BLUE CROSS/BLUE SHIELD | Attending: Emergency Medicine | Admitting: Emergency Medicine

## 2018-07-02 DIAGNOSIS — E1165 Type 2 diabetes mellitus with hyperglycemia: Secondary | ICD-10-CM | POA: Insufficient documentation

## 2018-07-02 DIAGNOSIS — R0602 Shortness of breath: Secondary | ICD-10-CM | POA: Diagnosis present

## 2018-07-02 DIAGNOSIS — I129 Hypertensive chronic kidney disease with stage 1 through stage 4 chronic kidney disease, or unspecified chronic kidney disease: Secondary | ICD-10-CM | POA: Insufficient documentation

## 2018-07-02 DIAGNOSIS — Z79899 Other long term (current) drug therapy: Secondary | ICD-10-CM | POA: Diagnosis not present

## 2018-07-02 DIAGNOSIS — E1122 Type 2 diabetes mellitus with diabetic chronic kidney disease: Secondary | ICD-10-CM | POA: Insufficient documentation

## 2018-07-02 DIAGNOSIS — Z87891 Personal history of nicotine dependence: Secondary | ICD-10-CM | POA: Diagnosis not present

## 2018-07-02 DIAGNOSIS — Z794 Long term (current) use of insulin: Secondary | ICD-10-CM | POA: Insufficient documentation

## 2018-07-02 DIAGNOSIS — Z923 Personal history of irradiation: Secondary | ICD-10-CM | POA: Insufficient documentation

## 2018-07-02 DIAGNOSIS — Z9221 Personal history of antineoplastic chemotherapy: Secondary | ICD-10-CM | POA: Insufficient documentation

## 2018-07-02 DIAGNOSIS — C3490 Malignant neoplasm of unspecified part of unspecified bronchus or lung: Secondary | ICD-10-CM | POA: Insufficient documentation

## 2018-07-02 DIAGNOSIS — J449 Chronic obstructive pulmonary disease, unspecified: Secondary | ICD-10-CM | POA: Diagnosis not present

## 2018-07-02 DIAGNOSIS — R739 Hyperglycemia, unspecified: Secondary | ICD-10-CM

## 2018-07-02 DIAGNOSIS — N189 Chronic kidney disease, unspecified: Secondary | ICD-10-CM | POA: Diagnosis not present

## 2018-07-02 LAB — COMPREHENSIVE METABOLIC PANEL
ALT: 27 U/L (ref 0–44)
AST: 20 U/L (ref 15–41)
Albumin: 3.4 g/dL — ABNORMAL LOW (ref 3.5–5.0)
Alkaline Phosphatase: 97 U/L (ref 38–126)
Anion gap: 11 (ref 5–15)
BUN: 28 mg/dL — ABNORMAL HIGH (ref 8–23)
CO2: 22 mmol/L (ref 22–32)
Calcium: 9.2 mg/dL (ref 8.9–10.3)
Chloride: 102 mmol/L (ref 98–111)
Creatinine, Ser: 1.16 mg/dL (ref 0.61–1.24)
GFR calc Af Amer: >60 mL/min (ref >60)
GFR calc non Af Amer: >60 mL/min (ref >60)
Glucose, Bld: 425 mg/dL — ABNORMAL HIGH (ref 70–99)
Potassium: 4.3 mmol/L (ref 3.5–5.1)
Sodium: 135 mmol/L (ref 135–145)
Total Bilirubin: 0.2 mg/dL — ABNORMAL LOW (ref 0.3–1.2)
Total Protein: 6.4 g/dL — ABNORMAL LOW (ref 6.5–8.1)

## 2018-07-02 LAB — CBC WITH DIFFERENTIAL/PLATELET
Abs Immature Granulocytes: 0.1 10*3/uL — ABNORMAL HIGH (ref 0.00–0.07)
Basophils Absolute: 0 10*3/uL (ref 0.0–0.1)
Basophils Relative: 0 %
Eosinophils Absolute: 0 10*3/uL (ref 0.0–0.5)
Eosinophils Relative: 0 %
HCT: 26.4 % — ABNORMAL LOW (ref 39.0–52.0)
Hemoglobin: 8.6 g/dL — ABNORMAL LOW (ref 13.0–17.0)
Immature Granulocytes: 1 %
Lymphocytes Relative: 4 %
Lymphs Abs: 0.3 10*3/uL — ABNORMAL LOW (ref 0.7–4.0)
MCH: 30.9 pg (ref 26.0–34.0)
MCHC: 32.6 g/dL (ref 30.0–36.0)
MCV: 95 fL (ref 80.0–100.0)
Monocytes Absolute: 0.3 10*3/uL (ref 0.1–1.0)
Monocytes Relative: 4 %
Neutro Abs: 7 10*3/uL (ref 1.7–7.7)
Neutrophils Relative %: 91 %
Platelets: 206 10*3/uL (ref 150–400)
RBC: 2.78 MIL/uL — ABNORMAL LOW (ref 4.22–5.81)
RDW: 14.8 % (ref 11.5–15.5)
WBC: 7.7 10*3/uL (ref 4.0–10.5)
nRBC: 0.5 % — ABNORMAL HIGH (ref 0.0–0.2)

## 2018-07-02 LAB — URINALYSIS, ROUTINE W REFLEX MICROSCOPIC
Bilirubin Urine: NEGATIVE
Glucose, UA: >=500 mg/dL — AB
Hgb urine dipstick: NEGATIVE
Ketones, ur: NEGATIVE mg/dL
Leukocytes,Ua: NEGATIVE
Nitrite: NEGATIVE
Protein, ur: NEGATIVE mg/dL
Specific Gravity, Urine: <1.005 — ABNORMAL LOW (ref 1.005–1.030)
pH: 7 (ref 5.0–8.0)

## 2018-07-02 LAB — URINALYSIS, MICROSCOPIC (REFLEX): Squamous Epithelial / HPF: NONE SEEN (ref 0–5)

## 2018-07-02 LAB — CBG MONITORING, ED
Glucose-Capillary: 409 mg/dL — ABNORMAL HIGH (ref 70–99)
Glucose-Capillary: 312 mg/dL — ABNORMAL HIGH (ref 70–99)

## 2018-07-02 LAB — TROPONIN I: Troponin I: <0.03 ng/mL (ref <0.03)

## 2018-07-02 MED ORDER — SODIUM CHLORIDE 0.9 % IV BOLUS
500.0000 mL | Freq: Once | INTRAVENOUS | Status: AC
  Administered 2018-07-02: 500 mL via INTRAVENOUS

## 2018-07-02 MED ORDER — IOHEXOL 350 MG/ML SOLN
100.0000 mL | Freq: Once | INTRAVENOUS | Status: AC | PRN
  Administered 2018-07-02: 100 mL via INTRAVENOUS

## 2018-07-02 MED ORDER — INSULIN REGULAR HUMAN 100 UNIT/ML IJ SOLN
5.0000 [IU] | Freq: Once | INTRAMUSCULAR | Status: AC
  Administered 2018-07-02: 5 [IU] via INTRAVENOUS
  Filled 2018-07-02: qty 1

## 2018-07-02 NOTE — ED Notes (Signed)
Patient transported to CT 

## 2018-07-02 NOTE — ED Provider Notes (Signed)
[facility] EMERGENCY DEPARTMENT Provider Note   CSN: [id number] Arrival date & time: 07/02/18  1124    History   Chief Complaint Chief Complaint  Patient presents with  . Shortness of Breath    HPI Travis [name] is a 63 y.o. 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 Patient states that he has baseline dyspnea w/ his lung cancer and has had issues with hyperglycemia since starting chemo/radiation therapy. Per chart review He had appointment via telemedicine with his endocrinologist yesterday during which his lantus was increased to 19 units qhs and he was started on Novolog 6 units TID QAC, continued on 1000 mg of Metformin BID. Took 6 units of novolog this AM & 19 units of lantus last night. He states he is persistently hyperglycemic w/ CBGs in the 400s. He states that with his increased hyperglycemia recently he has had increased dyspnea. He notes this is worse with exertion. No other alleviating/aggravating factors. Has his baseline cough, no acute change, no hemoptysis. Denies chest pain, fever, chills, N/V, abdominal pain, syncope, leg pain/swelling, recent surgery/trauma, recent long travel, hormone use,  or hx of DVT/PE. No recent travel or exposures to lab confirmed covid 19 case.      HPI  Past Medical History:  Diagnosis Date  . Arthritis   . Cancer ([place])    lung  . Chronic kidney disease   . Diabetes mellitus    TYPE II  . Dyspnea    at times  . Hypertension   . Pneumonia    "years ago"    Patient Active Problem List   Diagnosis Date Noted  . Chemotherapy-induced nausea 06/12/2018  . Leukopenia due to antineoplastic chemotherapy ([place]) 06/05/2018  . Chemotherapy-induced thrombocytopenia 06/05/2018  .  Radiation-induced esophagitis 05/29/2018  . Diabetes ([place]) 05/15/2018  . Diabetic neuropathy ([place]) 05/08/2018  . 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 ([place]) 04/30/2018  . COPD (chronic obstructive pulmonary disease) ([place]) 04/24/2018  . Right lower lobe lung mass   . AKI (acute kidney injury) ([place]) 04/09/2018  . Anemia due to antineoplastic chemotherapy 04/06/2018  . Tobacco use 04/06/2018  . Lung cancer ([place]) 04/03/2018    Past Surgical History:  Procedure Laterality Date  . COLONOSCOPY W/ POLYPECTOMY    . IR IMAGING GUIDED PORT INSERTION  05/05/2018  . NASAL FRACTURE SURGERY    . VIDEO BRONCHOSCOPY WITH ENDOBRONCHIAL ULTRASOUND Right 04/20/2018   Procedure: VIDEO BRONCHOSCOPY WITH ENDOBRONCHIAL ULTRASOUND;  Surgeon: Juanito Doom, MD;  Location: [facility] OR;  Service: Thoracic;  Laterality: Right;        Home Medications    Prior to Admission medications   Medication Sig Start Date End Date Taking? Authorizing Provider  amitriptyline (ELAVIL) 25 MG tablet Take 50 mg by mouth at bedtime. 04/03/18   [provider]  amLODipine (NORVASC) 5 MG tablet Take 5 mg by mouth daily. 04/10/18   [provider]  diclofenac (VOLTAREN) 75 MG EC tablet Take 75 mg by mouth 2 (two) times daily. 04/03/18   [provider]  fluconazole (DIFLUCAN) 100 MG tablet Take 2 tabs on the first day, and then one tab daily 06/26/18   Tish Men, MD  gabapentin (NEURONTIN) 300 MG capsule Take  2 capsules (600 mg total) by mouth 2 (two) times daily for 30 days. 06/05/18 07/05/18  Tish Men, MD  glucose blood test strip 4x a day 07/01/18   Shamleffer, Melanie Crazier, MD  insulin aspart (NOVOLOG FLEXPEN) 100 UNIT/ML FlexPen Inject 6 Units into the skin 3 (three) times daily with meals. 07/01/18   Shamleffer, Melanie Crazier, MD  Insulin Glargine (LANTUS) 100 UNIT/ML Solostar Pen Inject 19 Units into the skin at bedtime for 30 days. 07/01/18 07/31/18   Shamleffer, Melanie Crazier, MD  Insulin Pen Needle (BD PEN NEEDLE NANO U/F) 32G X 4 MM MISC 4x daily 07/01/18   Shamleffer, Melanie Crazier, MD  lidocaine (XYLOCAINE) 2 % solution Patient: Mix 1part 2% viscous lidocaine, 1part H20. Swish & swallow 23mL of diluted mixture, 75min before meals and at bedtime, up to QID 05/29/18   Tish Men, MD  lidocaine-prilocaine (EMLA) cream Apply to affected area once 04/24/18   Tish Men, MD  magnesium oxide (MAG-OX) 400 (241.3 Mg) MG tablet Take 1 tablet (400 mg total) by mouth 2 (two) times daily for 30 days. 06/12/18 07/12/18  Tish Men, MD  metFORMIN (GLUCOPHAGE-XR) 500 MG 24 hr tablet Take 2 tablets (1,000 mg total) by mouth 2 (two) times daily. 06/19/18 09/17/18  Tish Men, MD  mometasone (ELOCON) 0.1 % cream Apply thin layer to affected areas bid prn 05/05/18   [provider]  morphine (MSIR) 15 MG tablet Take 1 tablet (15 mg total) by mouth every 6 (six) hours as needed for up to 30 days for severe pain. 06/05/18 07/05/18  Tish Men, MD  ondansetron (ZOFRAN) 8 MG tablet Take 1 tablet (8 mg total) by mouth 2 (two) times daily as needed for refractory nausea / vomiting. Start on day 3 after chemo. 04/24/18   Tish Men, MD  prochlorperazine (COMPAZINE) 10 MG tablet Take 1 tablet (10 mg total) by mouth every 6 (six) hours as needed (Nausea or vomiting). 04/24/18   Tish Men, MD  sucralfate (CARAFATE) 1 g tablet Take 1 tablet (1 g total) by mouth 4 (four) times daily. 06/29/18   Kyung Rudd, MD    Family History Family History  Problem Relation Age of Onset  . Leukemia Sister   . Cancer Brother   . Diabetes Mother     Social History Social History   Tobacco Use  . Smoking status: Former Smoker    Packs/day: 1.00    Years: 45.00    Pack years: 45.00    Types: Cigarettes    Last attempt to quit: 03/31/2018    Years since quitting: 0.2  . Smokeless tobacco: Former Systems developer    Types: Chew    Quit date: 03/25/1973  Substance Use Topics  . Alcohol use: Yes     Alcohol/week: 2.0 standard drinks    Types: 2 Shots of liquor per week  . Drug use: No     Allergies   Patient has no known allergies.   Review of Systems Review of Systems  Constitutional: Negative for chills and fever.  HENT: Negative for congestion, ear pain and sore throat.   Respiratory: Positive for cough and shortness of breath. Negative for wheezing.   Cardiovascular: Negative for chest pain, palpitations and leg swelling.  Gastrointestinal: Negative for abdominal pain, nausea and vomiting.  Endocrine:       Positive for hyperglycemia.   Neurological: Negative for syncope.  All other systems reviewed and are negative.  Physical Exam Updated Vital Signs BP (!) 151/84 (BP Location: Right  Arm)   Pulse (!) 116   Temp 98.2 F (36.8 C) (Oral)   Resp (!) 28   Ht 5\' 11"  (1.803 m)   Wt 77.6 kg   SpO2 99%   BMI 23.85 kg/m   Physical Exam Vitals signs and nursing note reviewed.  Constitutional:      Appearance: He is well-developed. He is not toxic-appearing.  HENT:     Head: Normocephalic and atraumatic.     Right Ear: Tympanic membrane normal.     Left Ear: Tympanic membrane normal.  Eyes:     General:        Right eye: No discharge.        Left eye: No discharge.     Conjunctiva/sclera: Conjunctivae normal.  Neck:     Musculoskeletal: Neck supple. No neck rigidity.  Cardiovascular:     Rate and Rhythm: Regular rhythm. Tachycardia present.     Pulses:          Radial pulses are 2+ on the right side and 2+ on the left side.       Dorsalis pedis pulses are 2+ on the right side and 2+ on the left side.  Pulmonary:     Effort: Tachypnea present. No respiratory distress.     Breath sounds: Normal breath sounds. No wheezing, rhonchi or rales.     Comments: Able to speak in full sentences.  Chest:    Abdominal:     General: There is no distension.     Palpations: Abdomen is soft.     Tenderness: There is no abdominal tenderness. There is no guarding or  rebound.     Hernia: A hernia (easily reducible) is present. Hernia is present in the umbilical area.  Musculoskeletal:     Comments: Trace edema noted to the lower legs bilaterally without overlying erythema/warmth.   Skin:    General: Skin is warm and dry.     Findings: No rash.  Neurological:     Mental Status: He is alert.     Comments: Clear speech.   Psychiatric:        Behavior: Behavior normal.    ED Treatments / Results  Labs (all labs ordered are listed, but only abnormal results are displayed) Labs Reviewed  CBC WITH DIFFERENTIAL/PLATELET - Abnormal; Notable for the following components:      Result Value   RBC 2.78 (*)    Hemoglobin 8.6 (*)    HCT 26.4 (*)    nRBC 0.5 (*)    Lymphs Abs 0.3 (*)    Abs Immature Granulocytes 0.10 (*)    All other components within normal limits  COMPREHENSIVE METABOLIC PANEL - Abnormal; Notable for the following components:   Glucose, Bld 425 (*)    BUN 28 (*)    Total Protein 6.4 (*)    Albumin 3.4 (*)    Total Bilirubin 0.2 (*)    All other components within normal limits  URINALYSIS, ROUTINE W REFLEX MICROSCOPIC - Abnormal; Notable for the following components:   Specific Gravity, Urine <1.005 (*)    Glucose, UA >=500 (*)    All other components within normal limits  URINALYSIS, MICROSCOPIC (REFLEX) - Abnormal; Notable for the following components:   Bacteria, UA RARE (*)    All other components within normal limits  CBG MONITORING, ED - Abnormal; Notable for the following components:   Glucose-Capillary 409 (*)    All other components within normal limits  CBG MONITORING, ED - Abnormal; Notable for  the following components:   Glucose-Capillary 312 (*)    All other components within normal limits  TROPONIN I    EKG None  Radiology Ct Angio Chest Pe W/cm &/or Wo Cm  Result Date: 07/02/2018 CLINICAL DATA:  Shortness of breath, lung cancer, s/p chemo radiation EXAM: CT ANGIOGRAPHY CHEST WITH CONTRAST TECHNIQUE:  Multidetector CT imaging of the chest was performed using the standard protocol during bolus administration of intravenous contrast. Multiplanar CT image reconstructions and MIPs were obtained to evaluate the vascular anatomy. CONTRAST:  122mL OMNIPAQUE IOHEXOL 350 MG/ML SOLN COMPARISON:  03/31/2018 FINDINGS: Cardiovascular: Satisfactory opacification of the pulmonary arteries to the segmental level. No evidence of pulmonary embolism. Normal heart size. No pericardial effusion. Mediastinum/Nodes: No enlarged mediastinal, hilar, or axillary lymph nodes. Thyroid gland, trachea, and esophagus demonstrate no significant findings. Lungs/Pleura: Interval decrease in size of a spiculated perihilar mass of the right lower lobe with multiple adjacent small satellite nodules. This mass measures approximately 3.6 x 2.4 cm, previously 5.3 x 3.6 cm when measured similarly. Nodules measure approximately 4 mm and are unchanged from prior. No pleural effusion or pneumothorax. Upper Abdomen: No acute abnormality. Musculoskeletal: No chest wall abnormality. No acute or significant osseous findings. Review of the MIP images confirms the above findings. IMPRESSION: 1.  Negative examination for pulmonary embolism. 2. Interval decrease in size of a spiculated perihilar mass of the right lower lobe with multiple adjacent small satellite nodules. This mass measures approximately 3.6 x 2.4 cm, previously 5.3 x 3.6 cm when measured similarly. Nodules measure approximately 4 mm and are unchanged from prior. Findings are consistent with treatment response. No evidence of distant metastatic disease in the chest or partially included upper abdomen. Electronically Signed   By: Eddie Candle M.D.   On: 07/02/2018 14:35   Dg Chest Portable 1 View  Result Date: 07/02/2018 CLINICAL DATA:  Short of breath for 2 3 days after chemotherapy for lung carcinoma. Cough. EXAM: PORTABLE CHEST 1 VIEW COMPARISON:  Chest CT, 07/02/2018.  Chest radiograph,  03/31/2018. FINDINGS: Mass adjacent to the right hilum is again noted, appearing somewhat smaller and less dense than on the prior radiographs from 03/31/2018. Remainder of the lungs is clear. No convincing pleural effusion. No pneumothorax. Cardiac silhouette is normal in size. No mediastinal or left hilar mass or evidence of adenopathy. Left anterior chest wall power Port-A-Cath tip projects in the lower superior vena cava. Skeletal structures are grossly unremarkable. IMPRESSION: 1. No acute findings. 2. Right perihilar mass appears slightly smaller and less dense than on the prior radiographs. Electronically Signed   By: Lajean Manes M.D.   On: 07/02/2018 12:59    Procedures Procedures (including critical care time)  Medications Ordered in ED Medications  sodium chloride 0.9 % bolus 500 mL ( Intravenous Stopped 07/02/18 1345)  insulin regular (NOVOLIN R,HUMULIN R) 100 units/mL injection 5 Units (5 Units Intravenous Given 07/02/18 1409)  sodium chloride 0.9 % bolus 500 mL (500 mLs Intravenous New Bag/Given 07/02/18 1409)  iohexol (OMNIPAQUE) 350 MG/ML injection 100 mL (100 mLs Intravenous Contrast Given 07/02/18 1348)    Initial Impression / Assessment and Plan / ED Course  I have reviewed the triage vital signs and the nursing notes.  Pertinent labs & imaging results that were available during my care of the patient were reviewed by me and considered in my medical decision making (see chart for details).   Patient w/ hx of adenocarcinoma of the lung recently completed 1st round of chemo/radiation, also w/  hx of T2DM with recent adjustment in insulin regiment presents to the ER for hyperglycemia & increased dyspnea over past few days. Patient nontoxic appearing, noted to be tachypneic and tachycardic upon arrival with elevated blood pressure.  Does not appear to be in overt respiratory distress.  He is afebrile.  Lungs are clear to auscultation bilaterally.  Plan for labs to include troponin, EKG,  portable chest x-ray, and CT angio of the chest. Fluids being administered as initial CBG is 409.   Work-up reviewed: CBC: Anemia within prior ranges on chart review. No leukocytosis.  CMP: Hyperglycemia @ 425, BUN is elevated raising concern for dehydration, electrolytes without significant abnormality. Urinalysis: Glucosuria, no ketonuria, does not appear to have a UTI. Troponin: Negative EKG: No STEMI w/ negative trop and no chest pain feel ACS is less likely.  CXR: NO acute findings.  CTA: IMPRESSION: 1.  Negative examination for pulmonary embolism. 2. Interval decrease in size of a spiculated perihilar mass of the right lower lobe with multiple adjacent small satellite nodules. This mass measures approximately 3.6 x 2.4 cm, previously 5.3 x 3.6 cm when measured similarly. Nodules measure approximately 4 mm and are unchanged from prior. Findings are consistent with treatment response. No evidence of distant metastatic disease in the chest or partially included upper abdomen.  CTA without PE, pneumonia, or fluid overload. Decreased size of mass in the right lower lobe. CBG improved to 312 w/ insulin & fluids seems similar to prior on record.  Patient's labs are not consistent with DKA.  He is feeling substantially improved after interventions in the emergency department.  He states he no longer feels that he has increased shortness of breath from his usual, this may have been hyperglycemia related, but overall unclear. No known exposure or travel, does not meet testing criteria for covid 19, this was considered but seems less likely, instructed patient to quarantine. His vitals appear similar to prior, seems to be consistently tachycardic 110-120s @ prior visits, SpO2 remaining > 95% on RA. Appears appropriate for discharge with close endocrinology, PCP, and oncology follow up. We discussed need for close monitoring of blood sugar at home. I discussed results, treatment plan, need for follow-up, and  return precautions with the patient. Provided opportunity for questions, patient confirmed understanding and is in agreement with plan.   Findings and plan of care discussed with supervising physician Dr. Sedonia Small who is in agreement.   Final Clinical Impressions(s) / ED Diagnoses   Final diagnoses:  Hyperglycemia    ED Discharge Orders    None       Amaryllis Dyke, PA-C 07/02/18 1752    Maudie Flakes, MD 07/05/18 [phone]

## 2018-07-02 NOTE — ED Notes (Signed)
Pt on monitor 

## 2018-07-02 NOTE — ED Triage Notes (Signed)
Pt c/o SOB since starting chemo/radiation for lung CA-worse x 2-3 days with cough-also c/o elevated BS-pt with labored breathing-taking to tx room

## 2018-07-02 NOTE — Discharge Instructions (Addendum)
You were seen in the emergency department today for high blood sugar and shortness of breath.  Your work-up in the emergency department showed that your blood sugar is high, however you are not in what is called DKA which is reassuring.  You were given fluids and insulin with improvement in your blood sugars to the low 300s.  We would like you to monitor your blood sugar very closely at home, taking insulin and metformin as prescribed, follow-up closely with your endocrinologist within the next 1 to 3 days.  Your other labs appeared similar to prior.  Your imaging showed that the mass in your chest appears to be smaller.  Please follow-up with your oncologist as scheduled.  Follow-up with primary care within 1 to 3 days for reevaluation.  Return to the ER for new or worsening symptoms including but not limited to worsening shortness of breath, uncontrolled blood sugars, inability to keep fluids down, fever, cough, abdominal pain, chest pain, passing out, coughing up blood, or any other concerns.  Be sure to take quarantine precautions with coronavirus.

## 2018-07-03 MED FILL — Insulin Aspart Inj 100 Unit/ML: SUBCUTANEOUS | Qty: 0.05 | Status: AC

## 2018-07-06 ENCOUNTER — Encounter: Payer: Self-pay | Admitting: *Deleted

## 2018-07-06 ENCOUNTER — Telehealth: Payer: Self-pay | Admitting: Hematology

## 2018-07-06 NOTE — Telephone Encounter (Signed)
Called patient and LMVM regarding appointment change.  I asked him to call back and confirm/ per 4-13 staff message

## 2018-07-09 ENCOUNTER — Other Ambulatory Visit: Payer: BLUE CROSS/BLUE SHIELD

## 2018-07-09 ENCOUNTER — Inpatient Hospital Stay: Payer: BLUE CROSS/BLUE SHIELD

## 2018-07-09 ENCOUNTER — Ambulatory Visit: Payer: BLUE CROSS/BLUE SHIELD | Admitting: Hematology

## 2018-07-09 ENCOUNTER — Inpatient Hospital Stay (HOSPITAL_BASED_OUTPATIENT_CLINIC_OR_DEPARTMENT_OTHER): Payer: BLUE CROSS/BLUE SHIELD | Admitting: Hematology

## 2018-07-09 ENCOUNTER — Ambulatory Visit (HOSPITAL_BASED_OUTPATIENT_CLINIC_OR_DEPARTMENT_OTHER)
Admission: RE | Admit: 2018-07-09 | Discharge: 2018-07-09 | Disposition: A | Discharge status: Home / Self Care | Payer: BLUE CROSS/BLUE SHIELD | Source: Ambulatory Visit | Attending: Hematology | Admitting: Hematology

## 2018-07-09 ENCOUNTER — Other Ambulatory Visit: Payer: Self-pay

## 2018-07-09 ENCOUNTER — Encounter (HOSPITAL_BASED_OUTPATIENT_CLINIC_OR_DEPARTMENT_OTHER): Payer: Self-pay

## 2018-07-09 ENCOUNTER — Encounter: Payer: Self-pay | Admitting: Hematology

## 2018-07-09 ENCOUNTER — Ambulatory Visit (HOSPITAL_BASED_OUTPATIENT_CLINIC_OR_DEPARTMENT_OTHER): Payer: BLUE CROSS/BLUE SHIELD

## 2018-07-09 DIAGNOSIS — C7801 Secondary malignant neoplasm of right lung: Secondary | ICD-10-CM | POA: Diagnosis not present

## 2018-07-09 DIAGNOSIS — C3431 Malignant neoplasm of lower lobe, right bronchus or lung: Secondary | ICD-10-CM | POA: Diagnosis not present

## 2018-07-09 DIAGNOSIS — Z923 Personal history of irradiation: Secondary | ICD-10-CM

## 2018-07-09 DIAGNOSIS — K208 Other esophagitis without bleeding: Secondary | ICD-10-CM

## 2018-07-09 DIAGNOSIS — C801 Malignant (primary) neoplasm, unspecified: Secondary | ICD-10-CM | POA: Insufficient documentation

## 2018-07-09 DIAGNOSIS — Z79899 Other long term (current) drug therapy: Secondary | ICD-10-CM

## 2018-07-09 DIAGNOSIS — K3184 Gastroparesis: Secondary | ICD-10-CM

## 2018-07-09 DIAGNOSIS — T451X5A Adverse effect of antineoplastic and immunosuppressive drugs, initial encounter: Secondary | ICD-10-CM | POA: Diagnosis not present

## 2018-07-09 DIAGNOSIS — R0609 Other forms of dyspnea: Secondary | ICD-10-CM

## 2018-07-09 DIAGNOSIS — D6481 Anemia due to antineoplastic chemotherapy: Secondary | ICD-10-CM

## 2018-07-09 DIAGNOSIS — T66XXXA Radiation sickness, unspecified, initial encounter: Secondary | ICD-10-CM

## 2018-07-09 DIAGNOSIS — E1142 Type 2 diabetes mellitus with diabetic polyneuropathy: Secondary | ICD-10-CM

## 2018-07-09 DIAGNOSIS — Z95828 Presence of other vascular implants and grafts: Secondary | ICD-10-CM

## 2018-07-09 DIAGNOSIS — J449 Chronic obstructive pulmonary disease, unspecified: Secondary | ICD-10-CM

## 2018-07-09 DIAGNOSIS — E1143 Type 2 diabetes mellitus with diabetic autonomic (poly)neuropathy: Secondary | ICD-10-CM

## 2018-07-09 DIAGNOSIS — Z9221 Personal history of antineoplastic chemotherapy: Secondary | ICD-10-CM

## 2018-07-09 LAB — CBC WITH DIFFERENTIAL (CANCER CENTER ONLY)
Abs Immature Granulocytes: 0.2 10*3/uL — ABNORMAL HIGH (ref 0.00–0.07)
Basophils Absolute: 0 10*3/uL (ref 0.0–0.1)
Basophils Relative: 0 %
Eosinophils Absolute: 0.1 10*3/uL (ref 0.0–0.5)
Eosinophils Relative: 1 %
HCT: 30 % — ABNORMAL LOW (ref 39.0–52.0)
Hemoglobin: 9.4 g/dL — ABNORMAL LOW (ref 13.0–17.0)
Immature Granulocytes: 2 %
Lymphocytes Relative: 9 %
Lymphs Abs: 0.8 10*3/uL (ref 0.7–4.0)
MCH: 31.1 pg (ref 26.0–34.0)
MCHC: 31.3 g/dL (ref 30.0–36.0)
MCV: 99.3 fL (ref 80.0–100.0)
Monocytes Absolute: 0.7 10*3/uL (ref 0.1–1.0)
Monocytes Relative: 8 %
Neutro Abs: 6.9 10*3/uL (ref 1.7–7.7)
Neutrophils Relative %: 80 %
Platelet Count: 153 10*3/uL (ref 150–400)
RBC: 3.02 MIL/uL — ABNORMAL LOW (ref 4.22–5.81)
RDW: 15.8 % — ABNORMAL HIGH (ref 11.5–15.5)
WBC Count: 8.8 10*3/uL (ref 4.0–10.5)
nRBC: 0.8 % — ABNORMAL HIGH (ref 0.0–0.2)

## 2018-07-09 LAB — CMP (CANCER CENTER ONLY)
ALT: 22 U/L (ref 0–44)
AST: 12 U/L — ABNORMAL LOW (ref 15–41)
Albumin: 3.8 g/dL (ref 3.5–5.0)
Alkaline Phosphatase: 81 U/L (ref 38–126)
Anion gap: 8 (ref 5–15)
BUN: 28 mg/dL — ABNORMAL HIGH (ref 8–23)
CO2: 28 mmol/L (ref 22–32)
Calcium: 9.9 mg/dL (ref 8.9–10.3)
Chloride: 97 mmol/L — ABNORMAL LOW (ref 98–111)
Creatinine: 1.16 mg/dL (ref 0.61–1.24)
GFR, Est AFR Am: >60 mL/min (ref >60)
GFR, Estimated: >60 mL/min (ref >60)
Glucose, Bld: 124 mg/dL — ABNORMAL HIGH (ref 70–99)
Potassium: 4 mmol/L (ref 3.5–5.1)
Sodium: 133 mmol/L — ABNORMAL LOW (ref 135–145)
Total Bilirubin: 0.4 mg/dL (ref 0.3–1.2)
Total Protein: 6.1 g/dL — ABNORMAL LOW (ref 6.5–8.1)

## 2018-07-09 LAB — T4, FREE: Free T4: 0.97 ng/dL (ref 0.82–1.77)

## 2018-07-09 LAB — MAGNESIUM: Magnesium: 1.9 mg/dL (ref 1.7–2.4)

## 2018-07-09 MED ORDER — BUDESONIDE-FORMOTEROL FUMARATE 160-4.5 MCG/ACT IN AERO: 2.0000 | INHALATION_SPRAY | Freq: Two times a day (BID) | RESPIRATORY_TRACT | 12 refills | Status: DC

## 2018-07-09 MED ORDER — HEPARIN SOD (PORK) LOCK FLUSH 100 UNIT/ML IV SOLN
500.0000 [IU] | Freq: Once | INTRAVENOUS | Status: DC
  Filled 2018-07-09: qty 5

## 2018-07-09 MED ORDER — ALBUTEROL SULFATE HFA 108 (90 BASE) MCG/ACT IN AERS: 2.0000 | INHALATION_SPRAY | Freq: Four times a day (QID) | RESPIRATORY_TRACT | 5 refills | Status: DC | PRN

## 2018-07-09 MED ORDER — METOCLOPRAMIDE HCL 10 MG PO TABS: 10.0000 mg | ORAL_TABLET | Freq: Three times a day (TID) | ORAL | 0 refills | Status: DC

## 2018-07-09 MED ORDER — SODIUM CHLORIDE 0.9% FLUSH
10.0000 mL | INTRAVENOUS | Status: DC | PRN
  Filled 2018-07-09: qty 10

## 2018-07-09 MED ORDER — IOHEXOL 300 MG/ML  SOLN
100.0000 mL | Freq: Once | INTRAMUSCULAR | Status: AC | PRN
  Administered 2018-07-09: 100 mL via INTRAVENOUS

## 2018-07-09 NOTE — Progress Notes (Signed)
[place] OFFICE PROGRESS NOTE  Patient Care Team: Brock Ra, PA-C as PCP - General Cordelia Poche, RN as Oncology Nurse Navigator Tish Men, MD as Medical Oncologist (Hematology)  HEME/ONC OVERVIEW: 1. Stage IIIA [phone]) adenocarcinoma of the right lung  -03/2018: CT CAP showed a 4.2 x 3.3 x 3.1cm RLL mass extending to RUL, additional small noncalcified nodules throughout the right lung; no metastatic disease in the abdomen/pelvis   PET showed FDG-avid RLL mass 5.3 x 3.6cm and a satellite nodule in RLL; borderline FDG-avid (SUV 2.6) paratracheal LN   MRI brain negative for mets   EBUS w/ bx of the RLL mass showed poorly differentiated adenoCa with features of squamous cell differentiation; 4R LN negative for malignancy -04/2018 - 06/2018: definitive chemoradiation with weekly carbo/Taxol x 7 cycles  -Mid-06/2018: post-treatment CT CAP showed improving disease in the RLL mass as well adjacent small satellite nodules; no disease progression or mets   2. Port placed in 04/2018  TREATMENT REGIMEN:  05/08/2018 - 06/25/2018: weekly carbo/Taxol concurrent with radiation x 7 cycles  PERTINENT NON-HEM/ONC PROBLEMS: 1. Hx of tobacco use, quit in early 03/2018  ASSESSMENT & PLAN:   Stage IIIA ([id number]) adenocarcinoma of the right lung  -I independently reviewed the radiologic images of recent CT CAP, and agree with findings as documented -In summary, CT showed improving disease in RLL mass as well as the adjacent small satellite nodules.  There was no evidence of disease progression or metastatic disease. -I reviewed the NCCN guidelines in detail with the patient -Given the improving disease post-definitive chemoradiation, the recommended treatment is consolidation durvalumab -The goal of the treatment is for curative intent   -I discussed the rationale for and mechanism of immunotherapy -Some of the most common side effects include, but not limited to, fatigue,  decreased appetite, dyspnea, cough, nausea, musculoskeletal pain, and constipation. Clinically significant immune-related adverse events for patients receiving immunotherapy included pneumonitis, hepatitis, colitis, and thyroid disease -The recommended dose is durvalumab 10mg /kg q2weeks until disease progression or unacceptable toxicity for a maximum of 12 months  -The risks, benefits, side effects of treatment were discussed in detail with the patient, who agreed to proceed with plan of care -We will tentatively start his 1st treatment on 07/23/2018  -Periodic imaging monitoring with CT CAP q3-58months  -PRN anti-emetics: Zofran, Compazine and Ativan   Chemotherapy-associated anemia -Secondary to chemotherapy -Hgb 9.4, slightly improving -Patient denies any symptom of bleeding -We will monitor for now  Exertional dyspnea -Recent CTA chest negative for PTE -Most likely due to radiation-associated lung injury and underlying COPD   -PFT's in 03/2018 consistent with at least mild COPD with severe diffusion defect -I have prescribed Symbicort BID and PRN rescue albuterol inhaler   Early satiety -Given the patient's poorly controlled diabetes, the symptoms of abdominal fullness after eating and early satiety suggests gastroparesis -I spent some time counseling the patient on some dietary modifications, including smaller but more frequent meals -I have also prescribed a trial of Reglan 10mg  TIDAC PRN; patient was counseled on some of the side effects, including drowsiness, restlessness and dystonic reactions   Peripheral neuropathy -Secondary to underlying diabetes, exacerbated by chemotherapy -Currently on gabapentin 600mg  BID, tolerating it well -We will monitor it for now   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  All questions were answered. The patient knows to call the clinic with any problems, questions or concerns. No barriers to learning was detected.  A total of more than 40 minutes were spent face-to-face with the patient during this encounter and over half of that time was spent on counseling and coordination of care as outlined above.   Return on 07/23/2018 for port flush, labs, clinic appt and Cycle 1 of consolidation durvalumab.  Tish Men, MD 07/09/2018 3:56 PM  CHIEF COMPLAINT: "I am feeling full all the time"  INTERVAL HISTORY: Travis [name] presents to clinic for follow-up of Stage III lung cancer s/p definitive chemoradiation.  He reports that since completing chemoradiation, he has had some abdominal fullness after eating a meal with early satiety.  The sensation lasts 1 to 2 hours, and slowly resolves over time.  He is having normal bowel movements without any constipation or diarrhea.  He is overall maintaining his weight.  He has mild persistent dyspnea with exertion and dry cough, which is unchanged since his recent ER visit.  He denies any chest pain, sputum production, or hemoptysis.  His glucose has improved since start insulin regimen, with glucose less than 100 in the morning and in the 200's during the day.  He denies any other complaint today.  SUMMARY OF ONCOLOGIC HISTORY:   Lung cancer ([facility])   03/31/2018 Imaging    CT CAP with contrast: IMPRESSION: 1. 4.2 x 3.3 x 3.1 cm irregularly marginated mass within the superior segment of the right lower lobe appearing to extend across the fissure into the posterior inferior right upper lobe and extending toward the right hilum where the right  upper lobe superior segment bronchus is occluded, consistent with primary lung carcinoma. 2. Small noncalcified nodules more numerous throughout the right lung most consistent with lung metastases. 3. No metastatic involvement of the abdomen or pelvis by CT. 4. Moderate thoracic and abdominal aortic atherosclerosis.    04/03/2018 Initial Diagnosis    Lung cancer ([place])    05/08/2018 -  Chemotherapy    The patient had palonosetron (ALOXI) injection 0.25 mg, 0.25 mg, Intravenous,  Once, 7 of 7 cycles Administration: 0.25 mg (05/08/2018), 0.25 mg (06/05/2018), 0.25 mg (06/12/2018), 0.25 mg (05/15/2018), 0.25 mg (06/19/2018), 0.25 mg (05/22/2018), 0.25 mg (05/29/2018) CARBOplatin (PARAPLATIN) 200 mg in sodium chloride 0.9 % 250 mL chemo infusion, 200 mg (92.5 % of original dose 215.8 mg), Intravenous,  Once, 7 of 7 cycles Dose modification:   (original dose 215.8 mg, Cycle 1) Administration: 200 mg (05/08/2018), 210 mg (06/05/2018), 230 mg (06/12/2018), 210 mg (05/15/2018), 200 mg (06/19/2018), 180 mg (05/22/2018), 230 mg (05/29/2018) PACLitaxel (TAXOL) 90 mg in sodium chloride 0.9 % 250 mL chemo infusion (</= 80mg /m2), 45 mg/m2 = 90 mg, Intravenous,  Once, 7 of 7 cycles Administration: 90 mg (05/08/2018), 90 mg (06/05/2018), 90 mg (06/12/2018), 90 mg (05/15/2018), 90 mg (06/19/2018), 90 mg (05/22/2018), 90 mg (05/29/2018)  for chemotherapy treatment.     06/05/2018 Cancer Staging    Staging form: Lung, AJCC 8th Edition - Clinical: Stage IIIA (cT4, cN0, cM0) - Signed by Tish Men, MD on 06/05/2018    07/02/2018 Imaging    CTA chest: IMPRESSION: 1.  Negative examination for pulmonary embolism.  2. Interval decrease in size of a spiculated perihilar mass of the  right lower lobe with multiple adjacent small satellite nodules. This mass measures approximately 3.6 x 2.4 cm, previously 5.3 x 3.6 cm when measured similarly. Nodules measure approximately 4 mm and are unchanged from prior. Findings are consistent with  treatment response. No evidence of distant metastatic disease in the chest or partially included upper abdomen.     REVIEW OF SYSTEMS:   Constitutional: ( - ) fevers, ( - )  chills , ( - ) night sweats Eyes: ( - ) blurriness of vision, ( - ) double vision, ( - ) watery eyes Ears, nose, mouth, throat, and face: ( - ) mucositis, ( - ) sore throat Respiratory: ( + ) cough, ( + ) dyspnea, ( - ) wheezes Cardiovascular: ( - ) palpitation, ( - ) chest discomfort, ( - ) lower extremity swelling Gastrointestinal:  ( + ) nausea, ( - ) heartburn, ( - ) change in bowel habits Skin: ( - ) abnormal skin rashes Lymphatics: ( - ) new lymphadenopathy, ( - ) easy bruising Neurological: ( + ) numbness, ( - ) tingling, ( - ) new weaknesses Behavioral/Psych: ( - ) mood change, ( - ) new changes  All other systems were reviewed with the patient and are negative.  I have reviewed the past medical history, past surgical history, social history and family history with the patient and they are unchanged from previous note.  ALLERGIES:  has No Known Allergies.  MEDICATIONS:  Current Outpatient Medications  Medication Sig Dispense Refill  . albuterol (VENTOLIN HFA) 108 (90 Base) MCG/ACT inhaler Inhale 2 puffs into the lungs every 6 (six) hours as needed for wheezing or shortness of breath. 1 Inhaler 5  . amitriptyline (ELAVIL) 25 MG tablet Take 50 mg by mouth at bedtime.    Marland Kitchen amLODipine (NORVASC) 5 MG tablet Take 5 mg by mouth daily.    . budesonide-formoterol (SYMBICORT) 160-4.5 MCG/ACT inhaler Inhale 2 puffs into the lungs 2 (two) times daily. 1 Inhaler 12  . diclofenac (VOLTAREN) 75 MG EC tablet Take 75 mg by mouth 2 (two) times daily.    . fluconazole (DIFLUCAN) 100 MG tablet Take 2 tabs on the first day, and then one tab daily 15 tablet 0  . gabapentin (NEURONTIN) 300 MG capsule Take 2 capsules (600 mg total) by mouth 2 (two) times daily for 30 days. 120 capsule 5  . glucose blood test strip 4x a day 150  each 12  . insulin aspart (NOVOLOG FLEXPEN) 100 UNIT/ML FlexPen Inject 6 Units into the skin 3 (three) times daily with meals. 15 mL 11  . Insulin Glargine (LANTUS) 100 UNIT/ML Solostar Pen Inject 19 Units into the skin at bedtime for 30 days. 15 mL 3  . Insulin Pen Needle (BD PEN NEEDLE NANO U/F) 32G X 4 MM MISC 4x daily 150 each 6  . lidocaine (XYLOCAINE) 2 % solution Patient: Mix 1part 2% viscous lidocaine, 1part H20. Swish & swallow 66mL of diluted mixture, 47min before meals and at bedtime, up to QID 100 mL 5  . lidocaine-prilocaine (EMLA) cream Apply to affected area once 30 g 3  . magnesium oxide (MAG-OX) 400 (241.3 Mg) MG tablet Take 1 tablet (400 mg total) by mouth 2 (two) times daily for 30 days. 60 tablet 3  . metFORMIN (GLUCOPHAGE-XR) 500 MG 24 hr tablet Take 2 tablets (1,000 mg total) by mouth 2 (two) times daily. 360 tablet 3  . metoCLOPramide (REGLAN) 10 MG tablet Take 1 tablet (10 mg total) by mouth  3 (three) times daily before meals for 14 days. 42 tablet 0  . mometasone (ELOCON) 0.1 % cream Apply thin layer to affected areas bid prn    . ondansetron (ZOFRAN) 8 MG tablet Take 1 tablet (8 mg total) by mouth 2 (two) times daily as needed for refractory nausea / vomiting. Start on day 3 after chemo. 30 tablet 1  . prochlorperazine (COMPAZINE) 10 MG tablet Take 1 tablet (10 mg total) by mouth every 6 (six) hours as needed (Nausea or vomiting). 30 tablet 1  . sucralfate (CARAFATE) 1 g tablet Take 1 tablet (1 g total) by mouth 4 (four) times daily. 120 tablet 2   No current facility-administered medications for this visit.    Facility-Administered Medications Ordered in Other Visits  Medication Dose Route Frequency Provider Last Rate Last Dose  . heparin lock flush 100 unit/mL  500 Units Intravenous Once Volanda Napoleon, MD      . magnesium sulfate 2 g in sodium chloride 0.9 % 250 mL  2 g Intravenous Once Tish Men, MD      . sodium chloride flush (NS) 0.9 % injection 10 mL  10 mL  Intravenous PRN Ennever, Rudell Cobb, MD        PHYSICAL EXAMINATION: ECOG PERFORMANCE STATUS: 1 - Symptomatic but completely ambulatory  There were no vitals filed for this visit. There is no height or weight on file to calculate BMI.  There were no vitals filed for this visit.  GENERAL: alert, no distress and comfortable, slightly frail appearing SKIN: skin color, texture, turgor are normal, no rashes or significant lesions EYES: conjunctiva are pink and non-injected, sclera clear OROPHARYNX: no exudate, no erythema; lips, buccal mucosa, and tongue normal  NECK: supple, non-tender LUNGS: clear to auscultation with normal breathing effort HEART: regular rate & rhythm and no murmurs and no lower extremity edema ABDOMEN: soft, non-tender, non-distended, normal bowel sounds Musculoskeletal: no cyanosis of digits and no clubbing  PSYCH: alert & oriented x 3, fluent speech NEURO: no focal motor/sensory deficits  LABORATORY DATA:  I have reviewed the data as listed    Component Value Date/Time   NA 133 (L) 07/09/2018 1336   K 4.0 07/09/2018 1336   CL 97 (L) 07/09/2018 1336   CO2 28 07/09/2018 1336   GLUCOSE 124 (H) 07/09/2018 1336   BUN 28 (H) 07/09/2018 1336   CREATININE 1.16 07/09/2018 1336   CALCIUM 9.9 07/09/2018 1336   PROT 6.1 (L) 07/09/2018 1336   ALBUMIN 3.8 07/09/2018 1336   AST 12 (L) 07/09/2018 1336   ALT 22 07/09/2018 1336   ALKPHOS 81 07/09/2018 1336   BILITOT 0.4 07/09/2018 1336   GFRNONAA >60 07/09/2018 1336   GFRAA >60 07/09/2018 1336    No results found for: SPEP, UPEP  Lab Results  Component Value Date   WBC 8.8 07/09/2018   NEUTROABS 6.9 07/09/2018   HGB 9.4 (L) 07/09/2018   HCT 30.0 (L) 07/09/2018   MCV 99.3 07/09/2018   PLT 153 07/09/2018      Chemistry      Component Value Date/Time   NA 133 (L) 07/09/2018 1336   K 4.0 07/09/2018 1336   CL 97 (L) 07/09/2018 1336   CO2 28 07/09/2018 1336   BUN 28 (H) 07/09/2018 1336   CREATININE 1.16  07/09/2018 1336      Component Value Date/Time   CALCIUM 9.9 07/09/2018 1336   ALKPHOS 81 07/09/2018 1336   AST 12 (L) 07/09/2018 1336   ALT  22 07/09/2018 1336   BILITOT 0.4 07/09/2018 1336       RADIOGRAPHIC STUDIES: I have personally reviewed the radiological images as listed below and agreed with the findings in the report. Ct Angio Chest Pe W/cm &/or Wo Cm  Result Date: 07/02/2018 CLINICAL DATA:  Shortness of breath, lung cancer, s/p chemo radiation EXAM: CT ANGIOGRAPHY CHEST WITH CONTRAST TECHNIQUE: Multidetector CT imaging of the chest was performed using the standard protocol during bolus administration of intravenous contrast. Multiplanar CT image reconstructions and MIPs were obtained to evaluate the vascular anatomy. CONTRAST:  169mL OMNIPAQUE IOHEXOL 350 MG/ML SOLN COMPARISON:  03/31/2018 FINDINGS: Cardiovascular: Satisfactory opacification of the pulmonary arteries to the segmental level. No evidence of pulmonary embolism. Normal heart size. No pericardial effusion. Mediastinum/Nodes: No enlarged mediastinal, hilar, or axillary lymph nodes. Thyroid gland, trachea, and esophagus demonstrate no significant findings. Lungs/Pleura: Interval decrease in size of a spiculated perihilar mass of the right lower lobe with multiple adjacent small satellite nodules. This mass measures approximately 3.6 x 2.4 cm, previously 5.3 x 3.6 cm when measured similarly. Nodules measure approximately 4 mm and are unchanged from prior. No pleural effusion or pneumothorax. Upper Abdomen: No acute abnormality. Musculoskeletal: No chest wall abnormality. No acute or significant osseous findings. Review of the MIP images confirms the above findings. IMPRESSION: 1.  Negative examination for pulmonary embolism. 2. Interval decrease in size of a spiculated perihilar mass of the right lower lobe with multiple adjacent small satellite nodules. This mass measures approximately 3.6 x 2.4 cm, previously 5.3 x 3.6 cm when  measured similarly. Nodules measure approximately 4 mm and are unchanged from prior. Findings are consistent with treatment response. No evidence of distant metastatic disease in the chest or partially included upper abdomen. Electronically Signed   By: Eddie Candle M.D.   On: 07/02/2018 14:35   Ct Abdomen Pelvis W Contrast  Result Date: 07/09/2018 CLINICAL DATA:  63 year old male with right lower lobe non-small cell lung cancer, status post treatment EXAM: CT ABDOMEN AND PELVIS WITH CONTRAST TECHNIQUE: Multidetector CT imaging of the abdomen and pelvis was performed using the standard protocol following bolus administration of intravenous contrast. CONTRAST:  171mL OMNIPAQUE IOHEXOL 300 MG/ML  SOLN COMPARISON:  03/31/2018, PET-CT 04/14/2018 FINDINGS: Lower chest: The site of treated cancer is not visualized on the current CT. No acute finding of the lung bases. Hepatobiliary: Unremarkable liver.  Unremarkable gallbladder. Pancreas: Unremarkable pancreas Spleen: Unremarkable Adrenals/Urinary Tract: Unremarkable adrenal glands. Bilateral kidneys without hydronephrosis or nephrolithiasis. Unremarkable course the bilateral ureters. Unremarkable urinary bladder. Stomach/Bowel: Stomach partially distended with ingested material. No evidence of bowel obstruction. Unremarkable small bowel. Enteric contrast reaches the colon. Normal appendix. Moderate stool burden. No inflammatory changes. Vascular/Lymphatic: Atherosclerotic calcifications of the abdominal aorta. No aneurysm. Dense calcifications at the origin of the celiac artery which is patent. SMA is patent. Calcifications at the origin the bilateral renal arteries. CT a not protocol for the most sensitive evaluation of the vascular structures. IMA remains patent. Bilateral iliac arteries and proximal femoral arteries patent. No inguinal adenopathy, with similar appearance of small lymph nodes. No pelvic adenopathy. No retroperitoneal adenopathy. No adenopathy of  the upper abdomen. Reproductive: Unremarkable prostate.  Small right hydrocele. Other: Fat containing umbilical hernia. Musculoskeletal: No displaced fracture. Degenerative changes of the thoracolumbar spine. IMPRESSION: No acute CT finding. No CT evidence of metastatic disease. Atherosclerosis with bilateral renal artery disease. Aortic Atherosclerosis (ICD10-I70.0). Electronically Signed   By: Corrie Mckusick D.O.   On: 07/09/2018 14:24  Dg Chest Portable 1 View  Result Date: 07/02/2018 CLINICAL DATA:  Short of breath for 2 3 days after chemotherapy for lung carcinoma. Cough. EXAM: PORTABLE CHEST 1 VIEW COMPARISON:  Chest CT, 07/02/2018.  Chest radiograph, 03/31/2018. FINDINGS: Mass adjacent to the right hilum is again noted, appearing somewhat smaller and less dense than on the prior radiographs from 03/31/2018. Remainder of the lungs is clear. No convincing pleural effusion. No pneumothorax. Cardiac silhouette is normal in size. No mediastinal or left hilar mass or evidence of adenopathy. Left anterior chest wall power Port-A-Cath tip projects in the lower superior vena cava. Skeletal structures are grossly unremarkable. IMPRESSION: 1. No acute findings. 2. Right perihilar mass appears slightly smaller and less dense than on the prior radiographs. Electronically Signed   By: Lajean Manes M.D.   On: 07/02/2018 12:59

## 2018-07-09 NOTE — Patient Instructions (Signed)
Implanted Port Insertion, Care After  This sheet gives you information about how to care for yourself after your procedure. Your health care provider may also give you more specific instructions. If you have problems or questions, contact your health care provider.  What can I expect after the procedure?  After the procedure, it is common to have:  · Discomfort at the port insertion site.  · Bruising on the skin over the port. This should improve over 3-4 days.  Follow these instructions at home:  Port care  · After your port is placed, you will get a manufacturer's information card. The card has information about your port. Keep this card with you at all times.  · Take care of the port as told by your health care provider. Ask your health care provider if you or a family member can get training for taking care of the port at home. A home health care nurse may also take care of the port.  · Make sure to remember what type of port you have.  Incision care         · Follow instructions from your health care provider about how to take care of your port insertion site. Make sure you:  ? Wash your hands with soap and water before and after you change your bandage (dressing). If soap and water are not available, use hand sanitizer.  ? Change your dressing as told by your health care provider.  ? Leave stitches (sutures), skin glue, or adhesive strips in place. These skin closures may need to stay in place for 2 weeks or longer. If adhesive strip edges start to loosen and curl up, you may trim the loose edges. Do not remove adhesive strips completely unless your health care provider tells you to do that.  · Check your port insertion site every day for signs of infection. Check for:  ? Redness, swelling, or pain.  ? Fluid or blood.  ? Warmth.  ? Pus or a bad smell.  Activity  · Return to your normal activities as told by your health care provider. Ask your health care provider what activities are safe for you.  · Do not  lift anything that is heavier than 10 lb (4.5 kg), or the limit that you are told, until your health care provider says that it is safe.  General instructions  · Take over-the-counter and prescription medicines only as told by your health care provider.  · Do not take baths, swim, or use a hot tub until your health care provider approves. Ask your health care provider if you may take showers. You may only be allowed to take sponge baths.  · Do not drive for 24 hours if you were given a sedative during your procedure.  · Wear a medical alert bracelet in case of an emergency. This will tell any health care providers that you have a port.  · Keep all follow-up visits as told by your health care provider. This is important.  Contact a health care provider if:  · You cannot flush your port with saline as directed, or you cannot draw blood from the port.  · You have a fever or chills.  · You have redness, swelling, or pain around your port insertion site.  · You have fluid or blood coming from your port insertion site.  · Your port insertion site feels warm to the touch.  · You have pus or a bad smell coming from the port   insertion site.  Get help right away if:  · You have chest pain or shortness of breath.  · You have bleeding from your port that you cannot control.  Summary  · Take care of the port as told by your health care provider. Keep the manufacturer's information card with you at all times.  · Change your dressing as told by your health care provider.  · Contact a health care provider if you have a fever or chills or if you have redness, swelling, or pain around your port insertion site.  · Keep all follow-up visits as told by your health care provider.  This information is not intended to replace advice given to you by your health care provider. Make sure you discuss any questions you have with your health care provider.  Document Released: 12/30/2012 Document Revised: 10/07/2017 Document Reviewed:  10/07/2017  Elsevier Interactive Patient Education © 2019 [employer]

## 2018-07-10 ENCOUNTER — Encounter: Payer: Self-pay | Admitting: *Deleted

## 2018-07-10 ENCOUNTER — Other Ambulatory Visit: Payer: Self-pay | Admitting: Hematology

## 2018-07-10 DIAGNOSIS — C3431 Malignant neoplasm of lower lobe, right bronchus or lung: Secondary | ICD-10-CM

## 2018-07-10 LAB — TSH: TSH: 5.925 u[IU]/mL — ABNORMAL HIGH (ref 0.320–4.118)

## 2018-07-10 NOTE — Telephone Encounter (Signed)
Geraldo Pitter,  I signed a prescription for Ativan this morning. Is this a duplicate prescription?  Thanks.  Dr. Maylon Peppers

## 2018-07-10 NOTE — Progress Notes (Signed)
DISCONTINUE ON PATHWAY REGIMEN - Non-Small Cell Lung     Administer weekly:     Paclitaxel      Carboplatin   **Always confirm dose/schedule in your pharmacy ordering system**  REASON: Other Reason PRIOR TREATMENT: [id number]: Carboplatin AUC=2 + Paclitaxel 45 mg/m2 Weekly During Radiation TREATMENT RESPONSE: Partial Response (PR)  START ON PATHWAY REGIMEN - Non-Small Cell Lung     A cycle is every 14 days:     Durvalumab   **Always confirm dose/schedule in your pharmacy ordering system**  Patient Characteristics: Stage III - Unresectable, PS = 0, 1 AJCC T Category: T4 Current Disease Status: No Distant Mets or Local Recurrence AJCC N Category: N0 AJCC M Category: M0 AJCC 8 Stage Grouping: IIIA ECOG Performance Status: 1 Intent of Therapy: Curative Intent, Discussed with Patient

## 2018-07-10 NOTE — Addendum Note (Signed)
Addended by: Tish Men on: 07/10/2018 09:35 AM   Modules accepted: Orders

## 2018-07-16 NOTE — Progress Notes (Signed)
  Radiation Oncology         [phone]) [phone] ________________________________  Name: Travis [name] MRN: [id number]  Date: 06/25/2018  DOB: [birth year]  End of Treatment Note  Diagnosis:  63 y.o. 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 The patient then received a cone down boost treatment for an additional 6 Gy. This yielded a final total dose of 66 Gy.   Beams/energy:   3D / 6X Photon  Narrative: The patient tolerated radiation treatment relatively well.   The patient did experience esophagitis during the course of treatment which required management with Carafate, which significantly improved his symptoms. He also noted increased fatigue. Minimal hyperpigmentation was present in the radiation fields.  Plan: The patient has completed radiation treatment. The patient will return to radiation oncology clinic for routine followup in one month. I advised the patient to call or return sooner if they have any questions or concerns related to their recovery or treatment. ________________________________  Jodelle Gross, MD, PhD  This document serves as a record of services personally performed by Kyung Rudd, MD. It was created on his behalf by Rae Lips, a trained medical scribe. The creation of this record is based on the scribe's personal observations and the provider's statements to them. This document has been checked and approved by the attending provider.

## 2018-07-22 ENCOUNTER — Encounter: Payer: Self-pay | Admitting: *Deleted

## 2018-07-23 ENCOUNTER — Inpatient Hospital Stay: Payer: BLUE CROSS/BLUE SHIELD

## 2018-07-23 ENCOUNTER — Telehealth: Payer: Self-pay | Admitting: Hematology

## 2018-07-23 ENCOUNTER — Encounter: Payer: Self-pay | Admitting: *Deleted

## 2018-07-23 ENCOUNTER — Inpatient Hospital Stay (HOSPITAL_BASED_OUTPATIENT_CLINIC_OR_DEPARTMENT_OTHER): Payer: BLUE CROSS/BLUE SHIELD | Admitting: Hematology

## 2018-07-23 ENCOUNTER — Encounter: Payer: Self-pay | Admitting: Hematology

## 2018-07-23 ENCOUNTER — Other Ambulatory Visit: Payer: Self-pay

## 2018-07-23 ENCOUNTER — Telehealth: Payer: Self-pay | Admitting: Radiation Oncology

## 2018-07-23 DIAGNOSIS — C3431 Malignant neoplasm of lower lobe, right bronchus or lung: Secondary | ICD-10-CM

## 2018-07-23 DIAGNOSIS — R32 Unspecified urinary incontinence: Secondary | ICD-10-CM

## 2018-07-23 DIAGNOSIS — C7801 Secondary malignant neoplasm of right lung: Secondary | ICD-10-CM

## 2018-07-23 DIAGNOSIS — D6959 Other secondary thrombocytopenia: Secondary | ICD-10-CM

## 2018-07-23 DIAGNOSIS — Z923 Personal history of irradiation: Secondary | ICD-10-CM

## 2018-07-23 DIAGNOSIS — Z9221 Personal history of antineoplastic chemotherapy: Secondary | ICD-10-CM

## 2018-07-23 DIAGNOSIS — R55 Syncope and collapse: Secondary | ICD-10-CM

## 2018-07-23 DIAGNOSIS — D6481 Anemia due to antineoplastic chemotherapy: Secondary | ICD-10-CM | POA: Diagnosis not present

## 2018-07-23 DIAGNOSIS — R0609 Other forms of dyspnea: Secondary | ICD-10-CM

## 2018-07-23 DIAGNOSIS — T451X5A Adverse effect of antineoplastic and immunosuppressive drugs, initial encounter: Secondary | ICD-10-CM | POA: Diagnosis not present

## 2018-07-23 DIAGNOSIS — Z79899 Other long term (current) drug therapy: Secondary | ICD-10-CM

## 2018-07-23 DIAGNOSIS — N39490 Overflow incontinence: Secondary | ICD-10-CM

## 2018-07-23 DIAGNOSIS — B370 Candidal stomatitis: Secondary | ICD-10-CM

## 2018-07-23 DIAGNOSIS — E1142 Type 2 diabetes mellitus with diabetic polyneuropathy: Secondary | ICD-10-CM

## 2018-07-23 LAB — CMP (CANCER CENTER ONLY)
ALT: 27 U/L (ref 0–44)
AST: 11 U/L — ABNORMAL LOW (ref 15–41)
Albumin: 3.7 g/dL (ref 3.5–5.0)
Alkaline Phosphatase: 97 U/L (ref 38–126)
Anion gap: 11 (ref 5–15)
BUN: 24 mg/dL — ABNORMAL HIGH (ref 8–23)
CO2: 23 mmol/L (ref 22–32)
Calcium: 9.4 mg/dL (ref 8.9–10.3)
Chloride: 102 mmol/L (ref 98–111)
Creatinine: 1.06 mg/dL (ref 0.61–1.24)
GFR, Est AFR Am: >60 mL/min (ref >60)
GFR, Estimated: >60 mL/min (ref >60)
Glucose, Bld: 339 mg/dL — ABNORMAL HIGH (ref 70–99)
Potassium: 4.4 mmol/L (ref 3.5–5.1)
Sodium: 136 mmol/L (ref 135–145)
Total Bilirubin: 0.3 mg/dL (ref 0.3–1.2)
Total Protein: 6.3 g/dL — ABNORMAL LOW (ref 6.5–8.1)

## 2018-07-23 LAB — CBC WITH DIFFERENTIAL (CANCER CENTER ONLY)
Abs Immature Granulocytes: 0.02 10*3/uL (ref 0.00–0.07)
Basophils Absolute: 0 10*3/uL (ref 0.0–0.1)
Basophils Relative: 0 %
Eosinophils Absolute: 0 10*3/uL (ref 0.0–0.5)
Eosinophils Relative: 0 %
HCT: 27.4 % — ABNORMAL LOW (ref 39.0–52.0)
Hemoglobin: 9 g/dL — ABNORMAL LOW (ref 13.0–17.0)
Immature Granulocytes: 0 %
Lymphocytes Relative: 10 %
Lymphs Abs: 0.5 10*3/uL — ABNORMAL LOW (ref 0.7–4.0)
MCH: 31 pg (ref 26.0–34.0)
MCHC: 32.8 g/dL (ref 30.0–36.0)
MCV: 94.5 fL (ref 80.0–100.0)
Monocytes Absolute: 0.2 10*3/uL (ref 0.1–1.0)
Monocytes Relative: 3 %
Neutro Abs: 4.9 10*3/uL (ref 1.7–7.7)
Neutrophils Relative %: 87 %
Platelet Count: 98 10*3/uL — ABNORMAL LOW (ref 150–400)
RBC: 2.9 MIL/uL — ABNORMAL LOW (ref 4.22–5.81)
RDW: 14.4 % (ref 11.5–15.5)
WBC Count: 5.6 10*3/uL (ref 4.0–10.5)
nRBC: 0 % (ref 0.0–0.2)

## 2018-07-23 LAB — MAGNESIUM: Magnesium: 1.9 mg/dL (ref 1.7–2.4)

## 2018-07-23 MED ORDER — HEPARIN SOD (PORK) LOCK FLUSH 100 UNIT/ML IV SOLN
500.0000 [IU] | Freq: Once | INTRAVENOUS | Status: AC | PRN
  Administered 2018-07-23: 500 [IU]
  Filled 2018-07-23: qty 5

## 2018-07-23 MED ORDER — SODIUM CHLORIDE 0.9% FLUSH
10.0000 mL | INTRAVENOUS | Status: DC | PRN
  Administered 2018-07-23: 10 mL
  Filled 2018-07-23: qty 10

## 2018-07-23 MED ORDER — SODIUM CHLORIDE 0.9 % IV SOLN
9.5000 mg/kg | Freq: Once | INTRAVENOUS | Status: AC
  Administered 2018-07-23: 740 mg via INTRAVENOUS
  Filled 2018-07-23: qty 4.8

## 2018-07-23 MED ORDER — SODIUM CHLORIDE 0.9 % IV SOLN
Freq: Once | INTRAVENOUS | Status: AC
  Administered 2018-07-23: 15:00:00 via INTRAVENOUS
  Filled 2018-07-23: qty 250

## 2018-07-23 NOTE — Patient Instructions (Signed)

## 2018-07-23 NOTE — Telephone Encounter (Signed)
  Radiation Oncology         [phone] ________________________________  Name: Travis [name] MRN: [id number]  Date of Service: 07/23/2018  DOB: Nov 19, 1955  Post Treatment Telephone Note  Diagnosis:   Stage IIIA (cT4cNM0) adenocarcinoma of the right lung   Interval Since Last Radiation:  4 weeks   05/12/2018 - 06/25/2018: The patient was treated to the disease within the right lung initially to a dose of 60 Gy using a 4 field, 3-D conformal technique. The patient then received a cone down boost treatment for an additional 6 Gy. This yielded a final total dose of 66 Gy.    Narrative:  The patient was contacted today for routine follow-up. During treatment he did very well with radiotherapy and did not have significant desquamation. He did have moderate radiation esophagitis and reports this continues to improve and he's eating more of what he likes, thought he does still have some taste disturbances from chemotherapy. He has episodes of coughing as well and shortness of breath and recently started simbicort and albuterol inhalers. He was sent for CTPA due to his symptoms on 07/02/2018 and this revealed no PE and improvement of his treated lesion.   Impression/Plan: 1. Stage IIIA (cT4cNM0) adenocarcinoma of the right lung. The patient has been doing well since completion of radiotherapy. We discussed that we would be happy to continue to follow him as needed but that he would continue immunotherapy with Dr. Maylon Peppers. He was encouraged to also try OTC delsym or robitussin for his cough as well.     Carola Rhine, PAC

## 2018-07-23 NOTE — Progress Notes (Signed)
[facility] OFFICE PROGRESS NOTE  Patient Care Team: Brock Ra, PA-C as PCP - General Cordelia Poche, RN as Oncology Nurse Navigator Tish Men, MD as Medical Oncologist (Hematology)  HEME/ONC OVERVIEW: 1. Stage IIIA [phone]) adenocarcinoma of the right lung  -03/2018: CT CAP showed a 4.2 x 3.3 x 3.1cm RLL mass extending to RUL, additional small noncalcified nodules throughout the right lung; no metastatic disease in the abdomen/pelvis   PET showed FDG-avid RLL mass 5.3 x 3.6cm and a satellite nodule in RLL; borderline FDG-avid (SUV 2.6) paratracheal LN   MRI brain negative for mets   EBUS w/ bx of the RLL mass showed poorly differentiated adenoCa with features of squamous cell differentiation; 4R LN negative for malignancy -04/2018 - 06/2018: definitive chemoradiation with weekly carbo/Taxol x 7 cycles  -Mid-06/2018: post-treatment CT CAP showed improving disease in the RLL mass as well adjacent small satellite nodules; no disease progression or mets  -Late 06/2018 - present: consolidation durvalumab   2. Port placed in 04/2018  TREATMENT REGIMEN:  05/08/2018 - 06/25/2018: weekly carbo/Taxol concurrent with radiation x 7 cycles  07/23/2018 - present: consolidation durvalumab   PERTINENT NON-HEM/ONC PROBLEMS: 1. Hx of tobacco use, quit in early 03/2018  ASSESSMENT & PLAN:   Stage IIIA ([id number]) adenocarcinoma of the right lung  -S/p definitive chemoradiation with weekly carbo/Taxol, completed in early 06/2018 -Post-treatment CT showed improvement in the disease -I reinforced some of the benefits and potential toxicities of consolidation durvalumab; patient expressed understanding and agreed to the plan -Labs adequate today, proceed with Cycle 1 of durvalumab -Periodic TSH, free T4 monitoring -We will plan q3-43month CT CAP to monitor for any interval changes  -PRN anti-emetics: Zofran, Compazine, Ativan  Recent syncope -Etiology unclear, possibly due to  orthostatic hypotension vs. autonomic instability due to poorly controlled diabetes -However, given the patient's history of advanced lung cancer, I have ordered MRI brain without and with contrast to rule out any acute intracranial processes -I counseled patient on the importance of changing body positions slowly  Chemotherapy-associated anemia -Secondary to chemotherapy -Hgb 9.0, stable -Patient denies any symptom of bleeding -We will monitor for now  Chemotherapy-associated thrombocytopenia -Secondary to chemotherapy -Plts 98k, lower than last visit  -Patient denies any symptoms of bleeding or excess bruising, such as epistaxis, hematochezia, melena, or hematuria -We will monitor for now  Peripheral neuropathy -Secondary to underlying diabetes, exacerbated by recent chemotherapy -Currently on gabapentin 600mg  BID, tolerating it well   Poorly controlled diabetes -Followed by endocrinology -Currently on Lantus qhs with Novolog TID  -Glucose overall improving -I encouraged patient to continue follow-up with endocrinology as scheduled  Urinary incontinence -Likely due to overflow incontinence -No new focal neurologic deficits on exam -MRI brain w/o and w/ contrast as above -I encouraged patient to follow-up with his PCP for further management  Orders Placed This Encounter  Procedures  . MR Brain W Wo Contrast    Standing Status:   Future    Standing Expiration Date:   07/23/2019    Order Specific Question:   ** REASON FOR EXAM (FREE TEXT)    Answer:   Syncope, Stage III lung cancer    Order Specific Question:   If indicated for the ordered procedure, I authorize the administration of contrast media per Radiology protocol    Answer:   Yes    Order Specific Question:   What is the patient's sedation requirement?    Answer:   No Sedation    Order Specific  Question:   Does the patient have a pacemaker or implanted devices?    Answer:   No    Order Specific Question:   Use SRS  Protocol?    Answer:   No    Order Specific Question:   Radiology Contrast Protocol - do NOT remove file path    Answer:   [email or web address]    Order Specific Question:   Preferred imaging location?    Answer:   [facility] (table limit-350 lbs)   All questions were answered. The patient knows to call the clinic with any problems, questions or concerns. No barriers to learning was detected.  Return in 3 weeks for labs, port flush, clinic appointment, and Cycle 2 of durvalumab.  Tish Men, MD 07/23/2018 2:18 PM  CHIEF COMPLAINT: "I fell a couple of weeks ago"  INTERVAL HISTORY: 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  The patient reports that 2 weeks ago, he got up from a chair to go to the bathroom and fell.  He denied any preceding headache, vision change, or lightheadedness.  He does not know exactly how long he passed out, but according to his wife, he was out for a few seconds.  There was no tonic-clonic movement or biting of the tongue.  He denied any confusion after the fall.  Since then, he has not had any recurrent falls.  He has chronic neuropathy in the fingertips and toes, which is unchanged.  He denies any headache, vision change, lightheadedness, chest pain, dyspnea, abdominal pain, nausea, vomiting, or diarrhea.  He still has periodic early satiety.  His bowel movement has been normal.  Finally, patient reports he has had chronic, intermittent, urinary incontinence, but he has not seen his PCP for follow-up yet.  SUMMARY OF ONCOLOGIC HISTORY:   Lung cancer ([place])   03/31/2018 Imaging    CT CAP with contrast: IMPRESSION: 1. 4.2 x 3.3 x 3.1 cm irregularly marginated mass within the superior segment of the right lower lobe appearing to extend across the fissure into the posterior inferior right upper lobe and extending toward the right hilum where the right upper lobe superior segment  bronchus is occluded, consistent with primary lung carcinoma. 2. Small noncalcified nodules more numerous throughout the right lung most consistent with lung metastases. 3. No metastatic involvement of the abdomen or pelvis by CT. 4. Moderate thoracic and abdominal aortic atherosclerosis.    04/03/2018 Initial Diagnosis    Lung cancer ([place])    05/08/2018 -  Chemotherapy    The patient had palonosetron (ALOXI) injection 0.25 mg, 0.25 mg, Intravenous,  Once, 7 of 7 cycles Administration: 0.25 mg (05/08/2018), 0.25 mg (06/05/2018), 0.25 mg (06/12/2018), 0.25 mg (05/15/2018), 0.25 mg (06/19/2018), 0.25 mg (05/22/2018), 0.25 mg (05/29/2018) CARBOplatin (PARAPLATIN) 200 mg in sodium chloride 0.9 % 250 mL chemo infusion, 200 mg (92.5 % of original dose 215.8 mg), Intravenous,  Once, 7 of 7 cycles Dose modification:   (original dose 215.8 mg, Cycle 1) Administration: 200 mg (05/08/2018), 210 mg (06/05/2018), 230 mg (06/12/2018), 210 mg (05/15/2018), 200 mg (06/19/2018), 180 mg (05/22/2018), 230 mg (05/29/2018) PACLitaxel (TAXOL) 90 mg in sodium chloride 0.9 % 250 mL chemo infusion (</= 80mg /m2), 45 mg/m2 = 90 mg, Intravenous,  Once, 7 of 7 cycles Administration: 90 mg (05/08/2018), 90 mg (06/05/2018), 90 mg (06/12/2018), 90 mg (05/15/2018), 90 mg (06/19/2018), 90 mg (05/22/2018), 90 mg (05/29/2018)  for chemotherapy treatment.     06/05/2018  Cancer Staging    Staging form: Lung, AJCC 8th Edition - Clinical: Stage IIIA (cT4, cN0, cM0) - Signed by Tish Men, MD on 06/05/2018    07/02/2018 Imaging    CTA chest: IMPRESSION: 1.  Negative examination for pulmonary embolism.  2. Interval decrease in size of a spiculated perihilar mass of the right lower lobe with multiple adjacent small satellite nodules. This mass measures approximately 3.6 x 2.4 cm, previously 5.3 x 3.6 cm when measured similarly. Nodules measure approximately 4 mm and are unchanged from prior. Findings are consistent with treatment response. No evidence of  distant metastatic disease in the chest or partially included upper abdomen.    07/23/2018 -  Chemotherapy    The patient had durvalumab (IMFINZI) 780 mg in sodium chloride 0.9 % 100 mL chemo infusion, 10 mg/kg = 780 mg, Intravenous,  Once, 0 of 6 cycles  for chemotherapy treatment.      REVIEW OF SYSTEMS:   Constitutional: ( - ) fevers, ( - )  chills , ( - ) night sweats Eyes: ( - ) blurriness of vision, ( - ) double vision, ( - ) watery eyes Ears, nose, mouth, throat, and face: ( - ) mucositis, ( - ) sore throat Respiratory: ( + ) cough, ( - ) dyspnea, ( - ) wheezes Cardiovascular: ( - ) palpitation, ( - ) chest discomfort, ( - ) lower extremity swelling Gastrointestinal:  ( - ) nausea, ( - ) heartburn, ( - ) change in bowel habits Skin: ( - ) abnormal skin rashes Lymphatics: ( - ) new lymphadenopathy, ( - ) easy bruising Neurological: ( + ) numbness, ( - ) tingling, ( - ) new weaknesses Behavioral/Psych: ( - ) mood change, ( - ) new changes  All other systems were reviewed with the patient and are negative.  I have reviewed the past medical history, past surgical history, social history and family history with the patient and they are unchanged from previous note.  ALLERGIES:  has No Known Allergies.  MEDICATIONS:  Current Outpatient Medications  Medication Sig Dispense Refill  . albuterol (VENTOLIN HFA) 108 (90 Base) MCG/ACT inhaler Inhale 2 puffs into the lungs every 6 (six) hours as needed for wheezing or shortness of breath. 1 Inhaler 5  . amitriptyline (ELAVIL) 25 MG tablet Take 50 mg by mouth at bedtime.    Marland Kitchen amLODipine (NORVASC) 5 MG tablet Take 5 mg by mouth daily.    . budesonide-formoterol (SYMBICORT) 160-4.5 MCG/ACT inhaler Inhale 2 puffs into the lungs 2 (two) times daily. 1 Inhaler 12  . diclofenac (VOLTAREN) 75 MG EC tablet Take 75 mg by mouth 2 (two) times daily.    . fluconazole (DIFLUCAN) 100 MG tablet Take 2 tabs on the first day, and then one tab daily 15 tablet  0  . gabapentin (NEURONTIN) 300 MG capsule Take 2 capsules (600 mg total) by mouth 2 (two) times daily for 30 days. 120 capsule 5  . glucose blood test strip 4x a day 150 each 12  . insulin aspart (NOVOLOG FLEXPEN) 100 UNIT/ML FlexPen Inject 6 Units into the skin 3 (three) times daily with meals. 15 mL 11  . Insulin Glargine (LANTUS) 100 UNIT/ML Solostar Pen Inject 19 Units into the skin at bedtime for 30 days. 15 mL 3  . Insulin Pen Needle (BD PEN NEEDLE NANO U/F) 32G X 4 MM MISC 4x daily 150 each 6  . lidocaine (XYLOCAINE) 2 % solution Patient: Mix 1part 2% viscous lidocaine, 1part  H20. Swish & swallow 20mL of diluted mixture, 28min before meals and at bedtime, up to QID 100 mL 5  . lidocaine-prilocaine (EMLA) cream Apply to affected area once 30 g 3  . LORazepam (ATIVAN) 0.5 MG tablet TAKE 1 TABLET BY MOUTH EVERY 6 HOURS AS NEEDED FOR NAUSEA AND FOR VOMITING 30 tablet 0  . metFORMIN (GLUCOPHAGE-XR) 500 MG 24 hr tablet Take 2 tablets (1,000 mg total) by mouth 2 (two) times daily. 360 tablet 3  . metoCLOPramide (REGLAN) 10 MG tablet Take 1 tablet (10 mg total) by mouth 3 (three) times daily before meals for 14 days. 42 tablet 0  . mometasone (ELOCON) 0.1 % cream Apply thin layer to affected areas bid prn    . ondansetron (ZOFRAN) 8 MG tablet Take 1 tablet (8 mg total) by mouth 2 (two) times daily as needed for refractory nausea / vomiting. Start on day 3 after chemo. 30 tablet 1  . prochlorperazine (COMPAZINE) 10 MG tablet Take 1 tablet (10 mg total) by mouth every 6 (six) hours as needed (Nausea or vomiting). 30 tablet 1  . sucralfate (CARAFATE) 1 g tablet Take 1 tablet (1 g total) by mouth 4 (four) times daily. 120 tablet 2   No current facility-administered medications for this visit.    Facility-Administered Medications Ordered in Other Visits  Medication Dose Route Frequency Provider Last Rate Last Dose  . magnesium sulfate 2 g in sodium chloride 0.9 % 250 mL  2 g Intravenous Once Tish Men, MD        PHYSICAL EXAMINATION: ECOG PERFORMANCE STATUS: 1 - Symptomatic but completely ambulatory  There were no vitals filed for this visit. There is no height or weight on file to calculate BMI.  There were no vitals filed for this visit.  GENERAL: alert, no distress and comfortable SKIN: skin color, texture, turgor are normal, no rashes or significant lesions EYES: conjunctiva are pink and non-injected, sclera clear OROPHARYNX: no exudate, no erythema; lips, buccal mucosa, and tongue normal  NECK: supple, non-tender LUNGS: clear to auscultation with normal breathing effort HEART: regular rate & rhythm and no murmurs and no lower extremity edema ABDOMEN: soft, non-tender, non-distended, normal bowel sounds Musculoskeletal: no cyanosis of digits and no clubbing  PSYCH: alert & oriented x 3, fluent speech NEURO: no focal motor/sensory deficits  LABORATORY DATA:  I have reviewed the data as listed    Component Value Date/Time   NA 133 (L) 07/09/2018 1336   K 4.0 07/09/2018 1336   CL 97 (L) 07/09/2018 1336   CO2 28 07/09/2018 1336   GLUCOSE 124 (H) 07/09/2018 1336   BUN 28 (H) 07/09/2018 1336   CREATININE 1.16 07/09/2018 1336   CALCIUM 9.9 07/09/2018 1336   PROT 6.1 (L) 07/09/2018 1336   ALBUMIN 3.8 07/09/2018 1336   AST 12 (L) 07/09/2018 1336   ALT 22 07/09/2018 1336   ALKPHOS 81 07/09/2018 1336   BILITOT 0.4 07/09/2018 1336   GFRNONAA >60 07/09/2018 1336   GFRAA >60 07/09/2018 1336    No results found for: SPEP, UPEP  Lab Results  Component Value Date   WBC 5.6 07/23/2018   NEUTROABS 4.9 07/23/2018   HGB 9.0 (L) 07/23/2018   HCT 27.4 (L) 07/23/2018   MCV 94.5 07/23/2018   PLT 98 (L) 07/23/2018      Chemistry      Component Value Date/Time   NA 133 (L) 07/09/2018 1336   K 4.0 07/09/2018 1336   CL 97 (L) 07/09/2018  1336   CO2 28 07/09/2018 1336   BUN 28 (H) 07/09/2018 1336   CREATININE 1.16 07/09/2018 1336      Component Value Date/Time   CALCIUM  9.9 07/09/2018 1336   ALKPHOS 81 07/09/2018 1336   AST 12 (L) 07/09/2018 1336   ALT 22 07/09/2018 1336   BILITOT 0.4 07/09/2018 1336       RADIOGRAPHIC STUDIES: I have personally reviewed the radiological images as listed below and agreed with the findings in the report. Ct Angio Chest Pe W/cm &/or Wo Cm  Result Date: 07/02/2018 CLINICAL DATA:  Shortness of breath, lung cancer, s/p chemo radiation EXAM: CT ANGIOGRAPHY CHEST WITH CONTRAST TECHNIQUE: Multidetector CT imaging of the chest was performed using the standard protocol during bolus administration of intravenous contrast. Multiplanar CT image reconstructions and MIPs were obtained to evaluate the vascular anatomy. CONTRAST:  159mL OMNIPAQUE IOHEXOL 350 MG/ML SOLN COMPARISON:  03/31/2018 FINDINGS: Cardiovascular: Satisfactory opacification of the pulmonary arteries to the segmental level. No evidence of pulmonary embolism. Normal heart size. No pericardial effusion. Mediastinum/Nodes: No enlarged mediastinal, hilar, or axillary lymph nodes. Thyroid gland, trachea, and esophagus demonstrate no significant findings. Lungs/Pleura: Interval decrease in size of a spiculated perihilar mass of the right lower lobe with multiple adjacent small satellite nodules. This mass measures approximately 3.6 x 2.4 cm, previously 5.3 x 3.6 cm when measured similarly. Nodules measure approximately 4 mm and are unchanged from prior. No pleural effusion or pneumothorax. Upper Abdomen: No acute abnormality. Musculoskeletal: No chest wall abnormality. No acute or significant osseous findings. Review of the MIP images confirms the above findings. IMPRESSION: 1.  Negative examination for pulmonary embolism. 2. Interval decrease in size of a spiculated perihilar mass of the right lower lobe with multiple adjacent small satellite nodules. This mass measures approximately 3.6 x 2.4 cm, previously 5.3 x 3.6 cm when measured similarly. Nodules measure approximately 4 mm and are  unchanged from prior. Findings are consistent with treatment response. No evidence of distant metastatic disease in the chest or partially included upper abdomen. Electronically Signed   By: Eddie Candle M.D.   On: 07/02/2018 14:35   Ct Abdomen Pelvis W Contrast  Result Date: 07/09/2018 CLINICAL DATA:  63 year old male with right lower lobe non-small cell lung cancer, status post treatment EXAM: CT ABDOMEN AND PELVIS WITH CONTRAST TECHNIQUE: Multidetector CT imaging of the abdomen and pelvis was performed using the standard protocol following bolus administration of intravenous contrast. CONTRAST:  176mL OMNIPAQUE IOHEXOL 300 MG/ML  SOLN COMPARISON:  03/31/2018, PET-CT 04/14/2018 FINDINGS: Lower chest: The site of treated cancer is not visualized on the current CT. No acute finding of the lung bases. Hepatobiliary: Unremarkable liver.  Unremarkable gallbladder. Pancreas: Unremarkable pancreas Spleen: Unremarkable Adrenals/Urinary Tract: Unremarkable adrenal glands. Bilateral kidneys without hydronephrosis or nephrolithiasis. Unremarkable course the bilateral ureters. Unremarkable urinary bladder. Stomach/Bowel: Stomach partially distended with ingested material. No evidence of bowel obstruction. Unremarkable small bowel. Enteric contrast reaches the colon. Normal appendix. Moderate stool burden. No inflammatory changes. Vascular/Lymphatic: Atherosclerotic calcifications of the abdominal aorta. No aneurysm. Dense calcifications at the origin of the celiac artery which is patent. SMA is patent. Calcifications at the origin the bilateral renal arteries. CT a not protocol for the most sensitive evaluation of the vascular structures. IMA remains patent. Bilateral iliac arteries and proximal femoral arteries patent. No inguinal adenopathy, with similar appearance of small lymph nodes. No pelvic adenopathy. No retroperitoneal adenopathy. No adenopathy of the upper abdomen. Reproductive: Unremarkable prostate.  Small  right  hydrocele. Other: Fat containing umbilical hernia. Musculoskeletal: No displaced fracture. Degenerative changes of the thoracolumbar spine. IMPRESSION: No acute CT finding. No CT evidence of metastatic disease. Atherosclerosis with bilateral renal artery disease. Aortic Atherosclerosis (ICD10-I70.0). Electronically Signed   By: Corrie Mckusick D.O.   On: 07/09/2018 14:24   Dg Chest Portable 1 View  Result Date: 07/02/2018 CLINICAL DATA:  Short of breath for 2 3 days after chemotherapy for lung carcinoma. Cough. EXAM: PORTABLE CHEST 1 VIEW COMPARISON:  Chest CT, 07/02/2018.  Chest radiograph, 03/31/2018. FINDINGS: Mass adjacent to the right hilum is again noted, appearing somewhat smaller and less dense than on the prior radiographs from 03/31/2018. Remainder of the lungs is clear. No convincing pleural effusion. No pneumothorax. Cardiac silhouette is normal in size. No mediastinal or left hilar mass or evidence of adenopathy. Left anterior chest wall power Port-A-Cath tip projects in the lower superior vena cava. Skeletal structures are grossly unremarkable. IMPRESSION: 1. No acute findings. 2. Right perihilar mass appears slightly smaller and less dense than on the prior radiographs. Electronically Signed   By: Lajean Manes M.D.   On: 07/02/2018 12:59

## 2018-07-23 NOTE — Patient Instructions (Signed)
Durvalumab injection What is this medicine? DURVALUMAB (dur VAL ue mab) is a monoclonal antibody. It is used to treat urothelial cancer and lung cancer. This medicine may be used for other purposes; ask your health care provider or pharmacist if you have questions. COMMON BRAND NAME(S): IMFINZI What should I tell my health care provider before I take this medicine? They need to know if you have any of these conditions: -diabetes -immune system problems -infection -inflammatory bowel disease -kidney disease -liver disease -lung or breathing disease -lupus -organ transplant -stomach or intestine problems -thyroid disease -an unusual or allergic reaction to durvalumab, other medicines, foods, dyes, or preservatives -pregnant or trying to get pregnant -breast-feeding How should I use this medicine? This medicine is for infusion into a vein. It is given by a health care professional in a hospital or clinic setting. A special MedGuide will be given to you before each treatment. Be sure to read this information carefully each time. Talk to your pediatrician regarding the use of this medicine in children. Special care may be needed. Overdosage: If you think you have taken too much of this medicine contact a poison control center or emergency room at once. NOTE: This medicine is only for you. Do not share this medicine with others. What if I miss a dose? It is important not to miss your dose. Call your doctor or health care professional if you are unable to keep an appointment. What may interact with this medicine? Interactions have not been studied. This list may not describe all possible interactions. Give your health care provider a list of all the medicines, herbs, non-prescription drugs, or dietary supplements you use. Also tell them if you smoke, drink alcohol, or use illegal drugs. Some items may interact with your medicine. What should I watch for while using this medicine? This drug  may make you feel generally unwell. Continue your course of treatment even though you feel ill unless your doctor tells you to stop. You may need blood work done while you are taking this medicine. Do not become pregnant while taking this medicine or for 3 months after stopping it. Women should inform their doctor if they wish to become pregnant or think they might be pregnant. There is a potential for serious side effects to an unborn child. Talk to your health care professional or pharmacist for more information. Do not breast-feed an infant while taking this medicine or for 3 months after stopping it. What side effects may I notice from receiving this medicine? Side effects that you should report to your doctor or health care professional as soon as possible: -allergic reactions like skin rash, itching or hives, swelling of the face, lips, or tongue -black, tarry stools -bloody or watery diarrhea -breathing problems -change in emotions or moods -change in sex drive -changes in vision -chest pain or chest tightness -chills -confusion -cough -facial flushing -fever -headache -signs and symptoms of high blood sugar such as dizziness; dry mouth; dry skin; fruity breath; nausea; stomach pain; increased hunger or thirst; increased urination -signs and symptoms of liver injury like dark yellow or brown urine; general ill feeling or flu-like symptoms; light-colored stools; loss of appetite; nausea; right upper belly pain; unusually weak or tired; yellowing of the eyes or skin -stomach pain -trouble passing urine or change in the amount of urine -weight gain or weight loss Side effects that usually do not require medical attention (report these to your doctor or health care professional if they continue or are   bothersome): -bone pain -constipation -loss of appetite -muscle pain -nausea -swelling of the ankles, feet, hands -tiredness This list may not describe all possible side effects. Call  your doctor for medical advice about side effects. You may report side effects to FDA at 1-800-FDA-1088. Where should I keep my medicine? This drug is given in a hospital or clinic and will not be stored at home. NOTE: This sheet is a summary. It may not cover all possible information. If you have questions about this medicine, talk to your doctor, pharmacist, or health care provider.  2019 Elsevier/Gold Standard (2016-05-21 19:25:04)

## 2018-07-23 NOTE — Telephone Encounter (Signed)
No change in appts per 4/30 los

## 2018-07-23 NOTE — Progress Notes (Signed)
Ok to treat with platelets 98 & glucose 339 per Dr Maylon Peppers. dph

## 2018-07-24 ENCOUNTER — Ambulatory Visit (HOSPITAL_COMMUNITY)
Admission: RE | Admit: 2018-07-24 | Discharge: 2018-07-24 | Disposition: A | Discharge status: Home / Self Care | Payer: BLUE CROSS/BLUE SHIELD | Source: Ambulatory Visit | Attending: Hematology | Admitting: Hematology

## 2018-07-24 DIAGNOSIS — C3431 Malignant neoplasm of lower lobe, right bronchus or lung: Secondary | ICD-10-CM | POA: Diagnosis not present

## 2018-07-24 DIAGNOSIS — R55 Syncope and collapse: Secondary | ICD-10-CM | POA: Insufficient documentation

## 2018-07-24 MED ORDER — GADOBUTROL 1 MMOL/ML IV SOLN
8.0000 mL | Freq: Once | INTRAVENOUS | Status: AC | PRN
  Administered 2018-07-24: 8 mL via INTRAVENOUS

## 2018-07-27 ENCOUNTER — Telehealth: Payer: Self-pay | Admitting: *Deleted

## 2018-07-27 NOTE — Telephone Encounter (Signed)
-----   Message from Tish Men, MD sent at 07/27/2018  8:51 AM EDT ----- Can we let Mr. Monforte know that his MRI brain was normal?  Thanks.  [place]  ----- Message ----- From: Interface, Rad Results In Sent: 07/24/2018   4:10 PM EDT To: Tish Men, MD

## 2018-07-27 NOTE — Telephone Encounter (Signed)
Notified pt via VM results " normal" to call office with concerns.

## 2018-07-27 NOTE — Telephone Encounter (Signed)
Call received from patient and pt notified per order of Dr. Maylon Peppers that MRI of  brain is normal.  Pt appreciative of information and is requesting to change appt on 08/06/18 to 08/07/18.  Informed pt that I would send a message to scheduling.

## 2018-07-28 ENCOUNTER — Encounter: Payer: Self-pay | Admitting: General Practice

## 2018-07-28 NOTE — Progress Notes (Signed)
[place] CSW Progress Notes  Call from patient, reports financial distress, needs help w food and gas.  Has applied for Social Security disability w help of [employer].  Has had initial call from [facility], filed w [place] in March 2020.  Applied for [employer] "last month", has not heard from DSS yet.  CSW will contact Anderson Malta at [facility] to discuss status of his application.  Can also refer to [facility] [employer] program for periodic food distribution.  Will submit application for [employer] gas card.   Patient advised to contact DSS to discuss status of his Food Stamps application as this is usually a relatively quick process.  Also advised to ask at [facility] re any available financial aid.  CSW will also message [employer] of patient need.  Referred to [place] [employer] program.  Edwyna Shell, LCSW Clinical Social Worker Phone:  [phone]

## 2018-07-29 ENCOUNTER — Other Ambulatory Visit: Payer: Self-pay | Admitting: Hematology

## 2018-08-06 ENCOUNTER — Ambulatory Visit: Payer: BLUE CROSS/BLUE SHIELD | Admitting: Hematology

## 2018-08-06 ENCOUNTER — Other Ambulatory Visit: Payer: BLUE CROSS/BLUE SHIELD

## 2018-08-06 ENCOUNTER — Ambulatory Visit: Payer: BLUE CROSS/BLUE SHIELD

## 2018-08-07 ENCOUNTER — Inpatient Hospital Stay: Payer: BLUE CROSS/BLUE SHIELD | Attending: Hematology

## 2018-08-07 ENCOUNTER — Ambulatory Visit (HOSPITAL_BASED_OUTPATIENT_CLINIC_OR_DEPARTMENT_OTHER)
Admission: RE | Admit: 2018-08-07 | Discharge: 2018-08-07 | Disposition: A | Discharge status: Home / Self Care | Payer: BLUE CROSS/BLUE SHIELD | Source: Ambulatory Visit | Attending: Hematology | Admitting: Hematology

## 2018-08-07 ENCOUNTER — Other Ambulatory Visit: Payer: Self-pay | Admitting: Hematology

## 2018-08-07 ENCOUNTER — Other Ambulatory Visit: Payer: Self-pay | Admitting: *Deleted

## 2018-08-07 ENCOUNTER — Inpatient Hospital Stay (HOSPITAL_BASED_OUTPATIENT_CLINIC_OR_DEPARTMENT_OTHER): Payer: BLUE CROSS/BLUE SHIELD | Admitting: Hematology

## 2018-08-07 ENCOUNTER — Telehealth: Payer: Self-pay | Admitting: Hematology

## 2018-08-07 ENCOUNTER — Inpatient Hospital Stay: Payer: BLUE CROSS/BLUE SHIELD

## 2018-08-07 ENCOUNTER — Encounter: Payer: Self-pay | Admitting: Hematology

## 2018-08-07 ENCOUNTER — Other Ambulatory Visit: Payer: Self-pay

## 2018-08-07 VITALS — HR 74

## 2018-08-07 VITALS — BP 153/78 | HR 120 | Resp 24 | Ht 71.0 in

## 2018-08-07 DIAGNOSIS — R197 Diarrhea, unspecified: Secondary | ICD-10-CM | POA: Insufficient documentation

## 2018-08-07 DIAGNOSIS — Z5111 Encounter for antineoplastic chemotherapy: Secondary | ICD-10-CM | POA: Insufficient documentation

## 2018-08-07 DIAGNOSIS — D6481 Anemia due to antineoplastic chemotherapy: Secondary | ICD-10-CM | POA: Insufficient documentation

## 2018-08-07 DIAGNOSIS — R0601 Orthopnea: Secondary | ICD-10-CM | POA: Insufficient documentation

## 2018-08-07 DIAGNOSIS — E1142 Type 2 diabetes mellitus with diabetic polyneuropathy: Secondary | ICD-10-CM | POA: Diagnosis not present

## 2018-08-07 DIAGNOSIS — D72829 Elevated white blood cell count, unspecified: Secondary | ICD-10-CM | POA: Diagnosis not present

## 2018-08-07 DIAGNOSIS — T451X5A Adverse effect of antineoplastic and immunosuppressive drugs, initial encounter: Secondary | ICD-10-CM | POA: Diagnosis not present

## 2018-08-07 DIAGNOSIS — R1900 Intra-abdominal and pelvic swelling, mass and lump, unspecified site: Secondary | ICD-10-CM | POA: Insufficient documentation

## 2018-08-07 DIAGNOSIS — R55 Syncope and collapse: Secondary | ICD-10-CM | POA: Diagnosis not present

## 2018-08-07 DIAGNOSIS — Z95828 Presence of other vascular implants and grafts: Secondary | ICD-10-CM

## 2018-08-07 DIAGNOSIS — C3431 Malignant neoplasm of lower lobe, right bronchus or lung: Secondary | ICD-10-CM

## 2018-08-07 DIAGNOSIS — Z79899 Other long term (current) drug therapy: Secondary | ICD-10-CM | POA: Diagnosis not present

## 2018-08-07 LAB — CMP (CANCER CENTER ONLY)
ALT: 24 U/L (ref 0–44)
AST: 14 U/L — ABNORMAL LOW (ref 15–41)
Albumin: 3.8 g/dL (ref 3.5–5.0)
Alkaline Phosphatase: 92 U/L (ref 38–126)
Anion gap: 8 (ref 5–15)
BUN: 11 mg/dL (ref 8–23)
CO2: 26 mmol/L (ref 22–32)
Calcium: 9 mg/dL (ref 8.9–10.3)
Chloride: 104 mmol/L (ref 98–111)
Creatinine: 0.98 mg/dL (ref 0.61–1.24)
GFR, Est AFR Am: >60 mL/min (ref >60)
GFR, Estimated: >60 mL/min (ref >60)
Glucose, Bld: 121 mg/dL — ABNORMAL HIGH (ref 70–99)
Potassium: 4.1 mmol/L (ref 3.5–5.1)
Sodium: 138 mmol/L (ref 135–145)
Total Bilirubin: 0.3 mg/dL (ref 0.3–1.2)
Total Protein: 6.2 g/dL — ABNORMAL LOW (ref 6.5–8.1)

## 2018-08-07 LAB — CBC WITH DIFFERENTIAL (CANCER CENTER ONLY)
Abs Immature Granulocytes: 0.04 K/uL (ref 0.00–0.07)
Basophils Absolute: 0 K/uL (ref 0.0–0.1)
Basophils Relative: 0 %
Eosinophils Absolute: 0 K/uL (ref 0.0–0.5)
Eosinophils Relative: 0 %
HCT: 29.5 % — ABNORMAL LOW (ref 39.0–52.0)
Hemoglobin: 9.4 g/dL — ABNORMAL LOW (ref 13.0–17.0)
Immature Granulocytes: 1 %
Lymphocytes Relative: 21 %
Lymphs Abs: 1.2 K/uL (ref 0.7–4.0)
MCH: 30.2 pg (ref 26.0–34.0)
MCHC: 31.9 g/dL (ref 30.0–36.0)
MCV: 94.9 fL (ref 80.0–100.0)
Monocytes Absolute: 1 K/uL (ref 0.1–1.0)
Monocytes Relative: 17 %
Neutro Abs: 3.4 K/uL (ref 1.7–7.7)
Neutrophils Relative %: 61 %
Platelet Count: 277 K/uL (ref 150–400)
RBC: 3.11 MIL/uL — ABNORMAL LOW (ref 4.22–5.81)
RDW: 14.5 % (ref 11.5–15.5)
WBC Count: 5.6 K/uL (ref 4.0–10.5)
nRBC: 0.7 % — ABNORMAL HIGH (ref 0.0–0.2)

## 2018-08-07 LAB — MAGNESIUM: Magnesium: 1.6 mg/dL — ABNORMAL LOW (ref 1.7–2.4)

## 2018-08-07 MED ORDER — SODIUM CHLORIDE 0.9% FLUSH
10.0000 mL | INTRAVENOUS | Status: DC | PRN
  Administered 2018-08-07: 16:00:00 10 mL
  Filled 2018-08-07: qty 10

## 2018-08-07 MED ORDER — SODIUM CHLORIDE 0.9 % IV SOLN
9.5000 mg/kg | Freq: Once | INTRAVENOUS | Status: AC
  Administered 2018-08-07: 15:00:00 740 mg via INTRAVENOUS
  Filled 2018-08-07: qty 4.8

## 2018-08-07 MED ORDER — SODIUM CHLORIDE 0.9 % IV SOLN
Freq: Once | INTRAVENOUS | Status: AC
  Administered 2018-08-07: 14:00:00 via INTRAVENOUS
  Filled 2018-08-07: qty 250

## 2018-08-07 MED ORDER — LIDOCAINE-PRILOCAINE 2.5-2.5 % EX CREA: TOPICAL_CREAM | CUTANEOUS | 4 refills | Status: DC

## 2018-08-07 MED ORDER — SODIUM CHLORIDE 0.9% FLUSH
10.0000 mL | Freq: Once | INTRAVENOUS | Status: AC
  Administered 2018-08-07: 10 mL via INTRAVENOUS
  Filled 2018-08-07: qty 10

## 2018-08-07 MED ORDER — MAGNESIUM OXIDE 400 (241.3 MG) MG PO TABS: 400.0000 mg | ORAL_TABLET | Freq: Two times a day (BID) | ORAL | 5 refills | Status: AC

## 2018-08-07 MED ORDER — FUROSEMIDE 40 MG PO TABS: 40.0000 mg | ORAL_TABLET | Freq: Every day | ORAL | 3 refills | Status: DC

## 2018-08-07 MED ORDER — HEPARIN SOD (PORK) LOCK FLUSH 100 UNIT/ML IV SOLN
500.0000 [IU] | Freq: Once | INTRAVENOUS | Status: DC
  Filled 2018-08-07: qty 5

## 2018-08-07 MED ORDER — HEPARIN SOD (PORK) LOCK FLUSH 100 UNIT/ML IV SOLN
500.0000 [IU] | Freq: Once | INTRAVENOUS | Status: AC | PRN
  Administered 2018-08-07: 16:00:00 500 [IU]
  Filled 2018-08-07: qty 5

## 2018-08-07 MED ORDER — FUROSEMIDE 20 MG PO TABS
40.0000 mg | ORAL_TABLET | Freq: Once | ORAL | Status: AC
  Administered 2018-08-07: 40 mg via ORAL
  Filled 2018-08-07: qty 2

## 2018-08-07 NOTE — Telephone Encounter (Signed)
Fax medical records to: [facility] P: [phone] F: [phone]   for   Travis [name] [birth year] CASE: [id number]    COPY SCANNED

## 2018-08-07 NOTE — Telephone Encounter (Signed)
Appointment added to current schedule per 5/15 los

## 2018-08-07 NOTE — Progress Notes (Signed)
[place] OFFICE PROGRESS NOTE  Patient Care Team: Brock Ra, PA-C as PCP - General Cordelia Poche, RN as Oncology Nurse Navigator Tish Men, MD as Medical Oncologist (Hematology)  HEME/ONC OVERVIEW: 1. 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) adenocarcinoma of the right lung  -03/2018: CT CAP showed a 4.2 x 3.3 x 3.1cm RLL mass extending to RUL, additional small noncalcified nodules throughout the right lung; no metastatic disease in the abdomen/pelvis   PET showed FDG-avid RLL mass 5.3 x 3.6cm and a satellite nodule in RLL; borderline FDG-avid (SUV 2.6) paratracheal LN   MRI brain negative for mets   EBUS w/ bx of the RLL mass showed poorly differentiated adenoCa with features of squamous cell differentiation; 4R LN negative for malignancy -04/2018 - 06/2018: definitive chemoradiation with weekly carbo/Taxol x 7 cycles  -Mid-06/2018: post-treatment CT CAP showed improving disease in the RLL mass as well adjacent small satellite nodules; no disease progression or mets  -Late 06/2018 - present: consolidation durvalumab   2. Port placed in 04/2018  TREATMENT REGIMEN:  05/08/2018 - 06/25/2018: weekly carbo/Taxol concurrent with radiation x 7 cycles  07/23/2018 - present: consolidation durvalumab   PERTINENT NON-HEM/ONC PROBLEMS: 1. Hx of tobacco use, quit in early 03/2018  ASSESSMENT & PLAN:   Stage IIIA ([id number]) adenocarcinoma of the right lung  -S/p definitive chemoRT, completed in early 06/2018 -Currently on consolidation Imfinzi since late 06/2018  -Patient tolerated 1st cycle of Imfinzi relatively well except fatigue -Labs adequate, proceed with Cycle 2 of durvalumab  -Periodic TSH, free T4 monitoring; q3-69months CT CAP to monitor for any interval disease change   Recent syncope -Possibly due to orthostatic hypotension vs. autonomic instability due to poorly controlled DM II -I independently reviewed the radiologic images of MRI brain, and agree with the findings as  documented -In summary, MRI brain did not show any evidence of metastatic disease in the brain -I counseled the patient on the importance of changing body position slowly   Orthopnea -New onset, 1 week duration -I have ordered CXR today to assess any pleural effusion, and TTE to assess cardiac function -I have ordered PO Lasix 40mg  x 1 today in clinic, and prescribed Lasix 40mg  daily -If no increase in UOP, okay to increase dose to 80mg  daily  Abdominal swelling -Progressive over the past 4 weeks -I have ordered KUB today to rule out any bowel abdominal, and abdominal US to assess for ascites -Lasix as above   Chemotherapy-associated anemia -Secondary to chemotherapy -Hgb 9.4, stable -Patient denies any symptom of bleeding -We will monitor for now; no indication for dose adjustment  Peripheral neuropathy -Secondary to underlying diabetes, exacerbated by recent chemotherapy -Currently on gabapentin 600 mg BID with improvement in neuropathy -Continue gabapentin  Poorly controlled DM II -Followed by endocrinology -Currently on Lantus with NovoLog 3 times daily -Glucose overall improving -I encouraged patient to continue close follow-up with endocrinology as scheduled  Orders Placed This Encounter  Procedures  . US Abdomen Complete    Standing Status:   Future    Standing Expiration Date:   08/07/2019    Order Specific Question:   Reason for Exam (SYMPTOM  OR DIAGNOSIS REQUIRED)    Answer:   abdominal swelling    Order Specific Question:   Preferred imaging location?    Answer:   [facility]  . DG Chest 2 View    Standing Status:   Future    Standing Expiration Date:   08/07/2019    Order Specific Question:  Reason for Exam (SYMPTOM  OR DIAGNOSIS REQUIRED)    Answer:   Dyspnea    Order Specific Question:   Preferred imaging location?    Answer:   [facility] Specific Question:   Radiology Contrast Protocol - do NOT remove file path    Answer:    \\[email or web address].pdf  . DG Abd 2 Views    Standing Status:   Future    Standing Expiration Date:   08/07/2019    Order Specific Question:   Reason for Exam (SYMPTOM  OR DIAGNOSIS REQUIRED)    Answer:   Abdominal swelling    Order Specific Question:   Preferred imaging location?    Answer:   [facility]    Order Specific Question:   Radiology Contrast Protocol - do NOT remove file path    Answer:   \\[email or web address].pdf  . ECHOCARDIOGRAM COMPLETE    Standing Status:   Future    Standing Expiration Date:   11/07/2019    Order Specific Question:   Where should this test be performed    Answer:   [facility]    Order Specific Question:   Perflutren DEFINITY (image enhancing agent) should be administered unless hypersensitivity or allergy exist    Answer:   Administer Perflutren    Order Specific Question:   Reason for exam-Echo    Answer:   Dyspnea  786.09 / R06.00    Order Specific Question:   Other Comments    Answer:   Bilateral lower extremity swelling   All questions were answered. The patient knows to call the clinic with any problems, questions or concerns. No barriers to learning was detected.  Return in 2 weeks for labs, port flush, clinic appt with NP [facility] and infusion. Return in 4 weeks with me.   Tish Men, MD 08/07/2018 2:06 PM  CHIEF COMPLAINT: "I am a little more short of breath"  INTERVAL HISTORY: Travis. Rayner returns to clinic for follow-up of lung cancer on Imfinzi.  Patient reports that over the past 4 weeks, he has developed increasing abdominal swallowing, limiting his ability to bend forward as it causes him to be short of breath.  It addition, over the past week, he has developed new bilateral lower extremity swelling.  Over the past two days, the leg swelling is slightly better, but his leg still remains swollen.  He reports stable exertional dyspnea after walking  about 30 feet.  He denies any fever, chill, night sweats, weight change, chest pain, hemoptysis, or sputum production.  His neuropathy in the bilateral hands and feet remains overall stable.  SUMMARY OF ONCOLOGIC HISTORY:   Lung cancer ([place])   03/31/2018 Imaging    CT CAP with contrast: IMPRESSION: 1. 4.2 x 3.3 x 3.1 cm irregularly marginated mass within the superior segment of the right lower lobe appearing to extend across the fissure into the posterior inferior right upper lobe and extending toward the right hilum where the right upper lobe superior segment bronchus is occluded, consistent with primary lung carcinoma. 2. Small noncalcified nodules more numerous throughout the right lung most consistent with lung metastases. 3. No metastatic involvement of the abdomen or pelvis by CT. 4. Moderate thoracic and abdominal aortic atherosclerosis.    04/03/2018 Initial Diagnosis    Lung cancer ([place])    05/08/2018 - 06/25/2018 Chemotherapy    The patient had palonosetron (ALOXI) injection 0.25 mg, 0.25 mg, Intravenous,  Once, 7 of 7 cycles Administration: 0.25  mg (05/08/2018), 0.25 mg (06/05/2018), 0.25 mg (06/12/2018), 0.25 mg (05/15/2018), 0.25 mg (06/19/2018), 0.25 mg (05/22/2018), 0.25 mg (05/29/2018) CARBOplatin (PARAPLATIN) 200 mg in sodium chloride 0.9 % 250 mL chemo infusion, 200 mg (92.5 % of original dose 215.8 mg), Intravenous,  Once, 7 of 7 cycles Dose modification:   (original dose 215.8 mg, Cycle 1) Administration: 200 mg (05/08/2018), 210 mg (06/05/2018), 230 mg (06/12/2018), 210 mg (05/15/2018), 200 mg (06/19/2018), 180 mg (05/22/2018), 230 mg (05/29/2018) PACLitaxel (TAXOL) 90 mg in sodium chloride 0.9 % 250 mL chemo infusion (</= 80mg /m2), 45 mg/m2 = 90 mg, Intravenous,  Once, 7 of 7 cycles Administration: 90 mg (05/08/2018), 90 mg (06/05/2018), 90 mg (06/12/2018), 90 mg (05/15/2018), 90 mg (06/19/2018), 90 mg (05/22/2018), 90 mg (05/29/2018)  for chemotherapy treatment.     06/05/2018 Cancer Staging     Staging form: Lung, AJCC 8th Edition - Clinical: Stage IIIA (cT4, cN0, cM0) - Signed by Tish Men, MD on 06/05/2018    07/02/2018 Imaging    CTA chest: IMPRESSION: 1.  Negative examination for pulmonary embolism.  2. Interval decrease in size of a spiculated perihilar mass of the right lower lobe with multiple adjacent small satellite nodules. This mass measures approximately 3.6 x 2.4 cm, previously 5.3 x 3.6 cm when measured similarly. Nodules measure approximately 4 mm and are unchanged from prior. Findings are consistent with treatment response. No evidence of distant metastatic disease in the chest or partially included upper abdomen.    07/23/2018 -  Chemotherapy    The patient had durvalumab (IMFINZI) 740 mg in sodium chloride 0.9 % 100 mL chemo infusion, 9.5 mg/kg = 780 mg, Intravenous,  Once, 2 of 6 cycles Administration: 740 mg (07/23/2018)  for chemotherapy treatment.      REVIEW OF SYSTEMS:   Constitutional: ( - ) fevers, ( - )  chills , ( - ) night sweats Eyes: ( - ) blurriness of vision, ( - ) double vision, ( - ) watery eyes Ears, nose, mouth, throat, and face: ( - ) mucositis, ( - ) sore throat Respiratory: ( - ) cough, ( - ) dyspnea, ( - ) wheezes Cardiovascular: ( - ) palpitation, ( - ) chest discomfort, ( + ) lower extremity swelling Gastrointestinal:  ( - ) nausea, ( - ) heartburn, ( - ) change in bowel habits Skin: ( - ) abnormal skin rashes Lymphatics: ( - ) new lymphadenopathy, ( - ) easy bruising Neurological: ( + ) numbness, ( - ) tingling, ( - ) new weaknesses Behavioral/Psych: ( - ) mood change, ( - ) new changes  All other systems were reviewed with the patient and are negative.  I have reviewed the past medical history, past surgical history, social history and family history with the patient and they are unchanged from previous note.  ALLERGIES:  has No Known Allergies.  MEDICATIONS:  Current Outpatient Medications  Medication Sig Dispense  Refill  . albuterol (VENTOLIN HFA) 108 (90 Base) MCG/ACT inhaler Inhale 2 puffs into the lungs every 6 (six) hours as needed for wheezing or shortness of breath. 1 Inhaler 5  . amitriptyline (ELAVIL) 25 MG tablet Take 50 mg by mouth at bedtime.    Marland Kitchen amLODipine (NORVASC) 5 MG tablet Take 5 mg by mouth daily.    . budesonide-formoterol (SYMBICORT) 160-4.5 MCG/ACT inhaler Inhale 2 puffs into the lungs 2 (two) times daily. 1 Inhaler 12  . diclofenac (VOLTAREN) 75 MG EC tablet Take 75 mg by mouth 2 (two) times  daily.    . furosemide (LASIX) 40 MG tablet Take 1 tablet (40 mg total) by mouth daily for 30 days. 30 tablet 3  . gabapentin (NEURONTIN) 300 MG capsule Take 2 capsules (600 mg total) by mouth 2 (two) times daily for 30 days. 120 capsule 5  . glucose blood test strip 4x a day 150 each 12  . insulin aspart (NOVOLOG FLEXPEN) 100 UNIT/ML FlexPen Inject 6 Units into the skin 3 (three) times daily with meals. 15 mL 11  . Insulin Glargine (LANTUS) 100 UNIT/ML Solostar Pen Inject 19 Units into the skin at bedtime for 30 days. 15 mL 3  . Insulin Pen Needle (BD PEN NEEDLE NANO U/F) 32G X 4 MM MISC 4x daily 150 each 6  . lidocaine (XYLOCAINE) 2 % solution Patient: Mix 1part 2% viscous lidocaine, 1part H20. Swish & swallow 36mL of diluted mixture, 39min before meals and at bedtime, up to QID 100 mL 5  . lidocaine-prilocaine (EMLA) cream Apply a dime size of cream to port one- two hours prior to access. Cover with Saran wrap. 30 g 4  . metFORMIN (GLUCOPHAGE-XR) 500 MG 24 hr tablet Take 2 tablets (1,000 mg total) by mouth 2 (two) times daily. 360 tablet 3  . metoCLOPramide (REGLAN) 10 MG tablet Take 1 tablet (10 mg total) by mouth 3 (three) times daily before meals for 14 days. 42 tablet 0  . mometasone (ELOCON) 0.1 % cream Apply thin layer to affected areas bid prn    . sucralfate (CARAFATE) 1 g tablet Take 1 tablet (1 g total) by mouth 4 (four) times daily. 120 tablet 2   Current Facility-Administered  Medications  Medication Dose Route Frequency Provider Last Rate Last Dose  . furosemide (LASIX) tablet 40 mg  40 mg Oral Once Tish Men, MD       Facility-Administered Medications Ordered in Other Visits  Medication Dose Route Frequency Provider Last Rate Last Dose  . durvalumab (IMFINZI) 780 mg in sodium chloride 0.9 % 100 mL chemo infusion  10 mg/kg (Treatment Plan Recorded) Intravenous Once Tish Men, MD      . heparin lock flush 100 unit/mL  500 Units Intracatheter Once PRN Tish Men, MD      . magnesium sulfate 2 g in sodium chloride 0.9 % 250 mL  2 g Intravenous Once Tish Men, MD      . sodium chloride flush (NS) 0.9 % injection 10 mL  10 mL Intracatheter PRN Tish Men, MD        PHYSICAL EXAMINATION: ECOG PERFORMANCE STATUS: 1 - Symptomatic but completely ambulatory  Today's Vitals   08/07/18 1340  BP: (!) 153/78  Pulse: (!) 120  Resp: (!) 24  SpO2: 98%  Height: 5\' 11"  (4.650 m)  PainSc: 2    Body mass index is 23.85 kg/m.  There were no vitals filed for this visit.  GENERAL: alert, no distress and comfortable SKIN: skin color, texture, turgor are normal, no rashes or significant lesions EYES: conjunctiva are pink and non-injected, sclera clear OROPHARYNX: no exudate, no erythema; lips, buccal mucosa, and tongue normal  NECK: supple, non-tender LUNGS: clear to auscultation with normal breathing effort HEART: regular rate & rhythm and no murmurs and 2+ bilateral lower extremity edema ABDOMEN: soft, moderately distended, mild discomfort with palpation, normal bowel sounds Musculoskeletal: no cyanosis of digits and no clubbing  PSYCH: alert & oriented x 3, fluent speech  LABORATORY DATA:  I have reviewed the data as listed    Component  Value Date/Time   NA 138 08/07/2018 1320   K 4.1 08/07/2018 1320   CL 104 08/07/2018 1320   CO2 26 08/07/2018 1320   GLUCOSE 121 (H) 08/07/2018 1320   BUN 11 08/07/2018 1320   CREATININE 0.98 08/07/2018 1320   CALCIUM 9.0  08/07/2018 1320   PROT 6.2 (L) 08/07/2018 1320   ALBUMIN 3.8 08/07/2018 1320   AST 14 (L) 08/07/2018 1320   ALT 24 08/07/2018 1320   ALKPHOS 92 08/07/2018 1320   BILITOT 0.3 08/07/2018 1320   GFRNONAA >60 08/07/2018 1320   GFRAA >60 08/07/2018 1320    No results found for: SPEP, UPEP  Lab Results  Component Value Date   WBC 5.6 08/07/2018   NEUTROABS 3.4 08/07/2018   HGB 9.4 (L) 08/07/2018   HCT 29.5 (L) 08/07/2018   MCV 94.9 08/07/2018   PLT 277 08/07/2018      Chemistry      Component Value Date/Time   NA 138 08/07/2018 1320   K 4.1 08/07/2018 1320   CL 104 08/07/2018 1320   CO2 26 08/07/2018 1320   BUN 11 08/07/2018 1320   CREATININE 0.98 08/07/2018 1320      Component Value Date/Time   CALCIUM 9.0 08/07/2018 1320   ALKPHOS 92 08/07/2018 1320   AST 14 (L) 08/07/2018 1320   ALT 24 08/07/2018 1320   BILITOT 0.3 08/07/2018 1320       RADIOGRAPHIC STUDIES: I have personally reviewed the radiological images as listed below and agreed with the findings in the report. Travis [name] Wo Contrast  Result Date: 07/24/2018 CLINICAL DATA:  Metastatic lung cancer. Restaging. Negative exam previously. EXAM: MRI HEAD WITHOUT AND WITH CONTRAST TECHNIQUE: Multiplanar, multiecho pulse sequences of the brain and surrounding structures were obtained without and with intravenous contrast. CONTRAST:  8 cc Gadavist COMPARISON:  04/18/2018 FINDINGS: Brain: The brain has a normal appearance without evidence of malformation, atrophy, old or acute small or large vessel infarction, mass lesion, hemorrhage, hydrocephalus or extra-axial collection. After contrast administration, no abnormal enhancement occurs. Vascular: Major vessels at the base of the brain show flow. Venous sinuses appear patent. Skull and upper cervical spine: Normal. Sinuses/Orbits: Clear/normal. Other: None significant. IMPRESSION: Normal MRI of the brain.  No evidence of metastatic disease. Electronically Signed   By: Nelson Chimes M.D.   On: 07/24/2018 16:08   Ct Abdomen Pelvis W Contrast  Result Date: 07/09/2018 CLINICAL DATA:  63 year old male with right lower lobe non-small cell lung cancer, status post treatment EXAM: CT ABDOMEN AND PELVIS WITH CONTRAST TECHNIQUE: Multidetector CT imaging of the abdomen and pelvis was performed using the standard protocol following bolus administration of intravenous contrast. CONTRAST:  131mL OMNIPAQUE IOHEXOL 300 MG/ML  SOLN COMPARISON:  03/31/2018, PET-CT 04/14/2018 FINDINGS: Lower chest: The site of treated cancer is not visualized on the current CT. No acute finding of the lung bases. Hepatobiliary: Unremarkable liver.  Unremarkable gallbladder. Pancreas: Unremarkable pancreas Spleen: Unremarkable Adrenals/Urinary Tract: Unremarkable adrenal glands. Bilateral kidneys without hydronephrosis or nephrolithiasis. Unremarkable course the bilateral ureters. Unremarkable urinary bladder. Stomach/Bowel: Stomach partially distended with ingested material. No evidence of bowel obstruction. Unremarkable small bowel. Enteric contrast reaches the colon. Normal appendix. Moderate stool burden. No inflammatory changes. Vascular/Lymphatic: Atherosclerotic calcifications of the abdominal aorta. No aneurysm. Dense calcifications at the origin of the celiac artery which is patent. SMA is patent. Calcifications at the origin the bilateral renal arteries. CT a not protocol for the most sensitive evaluation of the vascular structures. IMA  remains patent. Bilateral iliac arteries and proximal femoral arteries patent. No inguinal adenopathy, with similar appearance of small lymph nodes. No pelvic adenopathy. No retroperitoneal adenopathy. No adenopathy of the upper abdomen. Reproductive: Unremarkable prostate.  Small right hydrocele. Other: Fat containing umbilical hernia. Musculoskeletal: No displaced fracture. Degenerative changes of the thoracolumbar spine. IMPRESSION: No acute CT finding. No CT evidence of  metastatic disease. Atherosclerosis with bilateral renal artery disease. Aortic Atherosclerosis (ICD10-I70.0). Electronically Signed   By: Corrie Mckusick D.O.   On: 07/09/2018 14:24

## 2018-08-07 NOTE — Patient Instructions (Signed)

## 2018-08-07 NOTE — Patient Instructions (Signed)
[place] Discharge Instructions for Patients Receiving Chemotherapy  Today you received the following chemotherapy agents Imfinzi To help prevent nausea and vomiting after your treatment, we encourage you to take your nausea medication as prescribed.   If you develop nausea and vomiting that is not controlled by your nausea medication, call the clinic.   BELOW ARE SYMPTOMS THAT SHOULD BE REPORTED IMMEDIATELY:  *FEVER GREATER THAN 100.5 F  *CHILLS WITH OR WITHOUT FEVER  NAUSEA AND VOMITING THAT IS NOT CONTROLLED WITH YOUR NAUSEA MEDICATION  *UNUSUAL SHORTNESS OF BREATH  *UNUSUAL BRUISING OR BLEEDING  TENDERNESS IN MOUTH AND THROAT WITH OR WITHOUT PRESENCE OF ULCERS  *URINARY PROBLEMS  *BOWEL PROBLEMS  UNUSUAL RASH Items with * indicate a potential emergency and should be followed up as soon as possible.  Feel free to call the clinic should you have any questions or concerns. The clinic phone number is [phone].  Please show the [place] at check-in to the Emergency Department and triage nurse.

## 2018-08-11 NOTE — Progress Notes (Signed)
[place] OFFICE PROGRESS NOTE  Patient Care Team: Brock Ra, PA-C as PCP - General Cordelia Poche, RN as Oncology Nurse Navigator Tish Men, MD as Medical Oncologist (Hematology)  HEME/ONC OVERVIEW: 1. Stage IIIA [phone]) adenocarcinoma of the right lung  -03/2018: CT CAP showed a 4.2 x 3.3 x 3.1cm RLL mass extending to RUL, additional small noncalcified nodules throughout the right lung; no metastatic disease in the abdomen/pelvis   PET showed FDG-avid RLL mass 5.3 x 3.6cm and a satellite nodule in RLL; borderline FDG-avid (SUV 2.6) paratracheal LN   MRI brain negative for mets   EBUS w/ bx of the RLL mass showed poorly differentiated adenoCa with features of squamous cell differentiation; 4R LN negative for malignancy -04/2018 - 06/2018: definitive chemoradiation with weekly carbo/Taxol x 7 cycles  -Mid-06/2018: post-treatment CT CAP showed improving disease in the RLL mass as well adjacent small satellite nodules; no disease progression or mets  -Late 06/2018 - present: consolidation durvalumab   2. Port placed in 04/2018  TREATMENT REGIMEN:  05/08/2018 - 06/25/2018: weekly carbo/Taxol concurrent with radiation x 7 cycles  07/23/2018 - present: consolidation durvalumab   PERTINENT NON-HEM/ONC PROBLEMS: 1. Hx of tobacco use, quit in early 03/2018  ASSESSMENT & PLAN:   Stage IIIA ([id number]) adenocarcinoma of the right lung  -S/p 2 cycles of consolidative Imfinzi -Labs adequate today, proceed with Cycle 3 of chemotherapy -We will plan to obtain ~q47month surveillance scans (next in mid-09/2018) to monitor for any disease progression while on consolidative immunotherapy  -PRN anti-emetics: Zofran and Compazine -Periodic thyroid function monitoring; q3-54months CT CAP to monitor for any interval change (next in mid-09/2018)  Orthopnea -I independently reviewed the radiologic images of recent CXR, which showed stable right lung mass without any significant  pleural effusion -TTE showed normal LVEF and Grade I diastolic dysfunction, as well as trivial pericardial effusion; no other evidence of significant cardiac dysfunction -Patient was prescribed Lasix 40mg  daily with improvement -Continue PRN lasix for now   Abdominal swelling -I independently reviewed the radiologic images of recent KUB, which did not show any evidence of bowel distention or obstruction -Abdominal US showed no evidence of ascites  -Lasix as outlined above; abdominal swelling overall stable -We will monitor it for now   Diarrhea -New onset; possibly due to food poisoning -No associated abdominal pain, nausea, vomiting, hematochezia or melena -Less likely due to immunotherapy, given the rapid onset of the symptoms -I counseled the patient on the importance of maintaining adequate hydration, and if he continues to have frequent/persistent diarrhea for the next 2-3 days, he is instructed to contact the clinic or go to the ER for further evaluation and to rule out immune-related colitis   Chemotherapy-associated anemia -Secondary to recent chemotherapy -Hgb 10.3, improving -Patient denies any symptom of bleeding -We will monitor for now; no indication for dose adjustment  Leukocytosis -New; possibly reactive due to new onset diarrhea -WBC 13.8k today -Patient denies any other associated symptoms -We will monitor it for now   Peripheral neuropathy -Secondary to underlying DM, possibly exacerbated by recent chemotherapy -Currently on gabapentin 600mg  BID with improvement in the symptoms -Continue gabapentin   No orders of the defined types were placed in this encounter.  All questions were answered. The patient knows to call the clinic with any problems, questions or concerns. No barriers to learning was detected.  Return on 09/17/2018 for labs, port flush, clinic appt and Cycle 5 of durvalumab.  Tish Men, MD 08/20/2018 11:15 AM  CHIEF COMPLAINT: "I am doing about  the same"  INTERVAL HISTORY: Mr. Lederman return to clinic for follow-up of Stage III lung cancer on consolidative durvalumab. He reports that he still has some intermittent swelling in the bilateral lower extremities, mostly in the bilateral thighs, since starting Lasix, the swelling has improved significantly.  He still has mild to moderate exertional dyspnea, which remains overall unchanged.  He reports that starting last night, he woke up with new onset diarrhea, without any associated abdominal pain, nausea, vomiting, hematochezia, or melena.  He had persistent diarrhea until about 8 AM this morning, and has not had any recurrent diarrhea since then.  He denies any other new complaint today.  SUMMARY OF ONCOLOGIC HISTORY:   Lung cancer ([place])   03/31/2018 Imaging    CT CAP with contrast: IMPRESSION: 1. 4.2 x 3.3 x 3.1 cm irregularly marginated mass within the superior segment of the right lower lobe appearing to extend across the fissure into the posterior inferior right upper lobe and extending toward the right hilum where the right upper lobe superior segment bronchus is occluded, consistent with primary lung carcinoma. 2. Small noncalcified nodules more numerous throughout the right lung most consistent with lung metastases. 3. No metastatic involvement of the abdomen or pelvis by CT. 4. Moderate thoracic and abdominal aortic atherosclerosis.    04/03/2018 Initial Diagnosis    Lung cancer ([place])    05/08/2018 - 06/25/2018 Chemotherapy    The patient had palonosetron (ALOXI) injection 0.25 mg, 0.25 mg, Intravenous,  Once, 7 of 7 cycles Administration: 0.25 mg (05/08/2018), 0.25 mg (06/05/2018), 0.25 mg (06/12/2018), 0.25 mg (05/15/2018), 0.25 mg (06/19/2018), 0.25 mg (05/22/2018), 0.25 mg (05/29/2018) CARBOplatin (PARAPLATIN) 200 mg in sodium chloride 0.9 % 250 mL chemo infusion, 200 mg (92.5 % of original dose 215.8 mg), Intravenous,  Once, 7 of 7 cycles Dose modification:   (original dose 215.8  mg, Cycle 1) Administration: 200 mg (05/08/2018), 210 mg (06/05/2018), 230 mg (06/12/2018), 210 mg (05/15/2018), 200 mg (06/19/2018), 180 mg (05/22/2018), 230 mg (05/29/2018) PACLitaxel (TAXOL) 90 mg in sodium chloride 0.9 % 250 mL chemo infusion (</= 80mg /m2), 45 mg/m2 = 90 mg, Intravenous,  Once, 7 of 7 cycles Administration: 90 mg (05/08/2018), 90 mg (06/05/2018), 90 mg (06/12/2018), 90 mg (05/15/2018), 90 mg (06/19/2018), 90 mg (05/22/2018), 90 mg (05/29/2018)  for chemotherapy treatment.     06/05/2018 Cancer Staging    Staging form: Lung, AJCC 8th Edition - Clinical: Stage IIIA (cT4, cN0, cM0) - Signed by Tish Men, MD on 06/05/2018    07/02/2018 Imaging    CTA chest: IMPRESSION: 1.  Negative examination for pulmonary embolism.  2. Interval decrease in size of a spiculated perihilar mass of the right lower lobe with multiple adjacent small satellite nodules. This mass measures approximately 3.6 x 2.4 cm, previously 5.3 x 3.6 cm when measured similarly. Nodules measure approximately 4 mm and are unchanged from prior. Findings are consistent with treatment response. No evidence of distant metastatic disease in the chest or partially included upper abdomen.    07/23/2018 -  Chemotherapy    The patient had durvalumab (IMFINZI) 740 mg in sodium chloride 0.9 % 100 mL chemo infusion, 9.5 mg/kg = 780 mg, Intravenous,  Once, 3 of 9 cycles Administration: 740 mg (07/23/2018), 740 mg (08/07/2018)  for chemotherapy treatment.      REVIEW OF SYSTEMS:   Constitutional: ( - ) fevers, ( - )  chills , ( - ) night sweats Eyes: ( - ) blurriness  of vision, ( - ) double vision, ( - ) watery eyes Ears, nose, mouth, throat, and face: ( - ) mucositis, ( - ) sore throat Respiratory: ( - ) cough, ( + ) dyspnea, ( - ) wheezes Cardiovascular: ( - ) palpitation, ( - ) chest discomfort, ( + ) lower extremity swelling Gastrointestinal:  ( - ) nausea, ( - ) heartburn, ( + ) change in bowel habits Skin: ( - ) abnormal skin  rashes Lymphatics: ( - ) new lymphadenopathy, ( - ) easy bruising Neurological: ( + ) numbness, ( + ) tingling, ( - ) new weaknesses Behavioral/Psych: ( - ) mood change, ( - ) new changes  All other systems were reviewed with the patient and are negative.  I have reviewed the past medical history, past surgical history, social history and family history with the patient and they are unchanged from previous note.  ALLERGIES:  has No Known Allergies.  MEDICATIONS:  Current Outpatient Medications  Medication Sig Dispense Refill  . albuterol (VENTOLIN HFA) 108 (90 Base) MCG/ACT inhaler Inhale 2 puffs into the lungs every 6 (six) hours as needed for wheezing or shortness of breath. 1 Inhaler 5  . amitriptyline (ELAVIL) 25 MG tablet Take 50 mg by mouth at bedtime.    Marland Kitchen amLODipine (NORVASC) 5 MG tablet Take 5 mg by mouth daily.    . budesonide-formoterol (SYMBICORT) 160-4.5 MCG/ACT inhaler Inhale 2 puffs into the lungs 2 (two) times daily. 1 Inhaler 12  . dexamethasone (DECADRON) 4 MG tablet TAKE 2 TABLETS BY MOUTH ONCE DAILY. START THE DAY AFTER CHEMOTHERAPY FOR 2 DAYS.    Marland Kitchen diclofenac (VOLTAREN) 75 MG EC tablet Take 75 mg by mouth 2 (two) times daily.    . furosemide (LASIX) 40 MG tablet Take 1 tablet (40 mg total) by mouth daily for 30 days. 30 tablet 3  . gabapentin (NEURONTIN) 300 MG capsule Take 600 mg by mouth 2 (two) times daily.    Marland Kitchen glimepiride (AMARYL) 2 MG tablet TAKE 1 TABLET BY MOUTH ONCE DAILY BEFORE BREAKFAST    . glucose blood test strip 4x a day 150 each 12  . insulin aspart (NOVOLOG FLEXPEN) 100 UNIT/ML FlexPen Inject 6 Units into the skin 3 (three) times daily with meals. 15 mL 11  . Insulin Pen Needle (BD PEN NEEDLE NANO U/F) 32G X 4 MM MISC 4x daily 150 each 6  . lidocaine (XYLOCAINE) 2 % solution Patient: Mix 1part 2% viscous lidocaine, 1part H20. Swish & swallow 57mL of diluted mixture, 29min before meals and at bedtime, up to QID 100 mL 5  . lidocaine-prilocaine (EMLA)  cream Apply a dime size of cream to port one- two hours prior to access. Cover with Saran wrap. 30 g 4  . magnesium oxide (MAG-OX) 400 (241.3 Mg) MG tablet Take 1 tablet (400 mg total) by mouth 2 (two) times daily for 30 days. 60 tablet 5  . metFORMIN (GLUCOPHAGE-XR) 500 MG 24 hr tablet Take 2 tablets (1,000 mg total) by mouth 2 (two) times daily. 360 tablet 3  . mometasone (ELOCON) 0.1 % cream Apply thin layer to affected areas bid prn    . sucralfate (CARAFATE) 1 g tablet Take 1 tablet (1 g total) by mouth 4 (four) times daily. 120 tablet 2  . traMADol (ULTRAM) 50 MG tablet TAKE 1 TABLET BY MOUTH TWICE DAILY FOR 30 DAYS    . gabapentin (NEURONTIN) 300 MG capsule Take 2 capsules (600 mg total) by mouth 2 (two)  times daily for 30 days. 120 capsule 5  . Insulin Glargine (LANTUS) 100 UNIT/ML Solostar Pen Inject 19 Units into the skin at bedtime for 30 days. 15 mL 3  . metoCLOPramide (REGLAN) 10 MG tablet Take 1 tablet (10 mg total) by mouth 3 (three) times daily before meals for 14 days. 42 tablet 0   No current facility-administered medications for this visit.    Facility-Administered Medications Ordered in Other Visits  Medication Dose Route Frequency Provider Last Rate Last Dose  . durvalumab (IMFINZI) 780 mg in sodium chloride 0.9 % 100 mL chemo infusion  10 mg/kg (Treatment Plan Recorded) Intravenous Once Tish Men, MD      . heparin lock flush 100 unit/mL  500 Units Intracatheter Once PRN Tish Men, MD      . magnesium sulfate 2 g in sodium chloride 0.9 % 250 mL  2 g Intravenous Once Tish Men, MD      . sodium chloride flush (NS) 0.9 % injection 10 mL  10 mL Intracatheter PRN Tish Men, MD        PHYSICAL EXAMINATION: ECOG PERFORMANCE STATUS: 2 - Symptomatic, <50% confined to bed  Today's Vitals   08/20/18 1052  BP: 119/83  Pulse: (!) 114  Resp: 19  Temp: 97.9 F (36.6 C)  TempSrc: Oral  SpO2: 100%  Weight: 181 lb (82.1 kg)  Height: 5\' 11"  (1.803 m)   Body mass index is 25.24  kg/m.  Filed Weights   08/20/18 1052  Weight: 181 lb (82.1 kg)    GENERAL: alert, no distress and comfortable SKIN: skin color, texture, turgor are normal, no rashes or significant lesions EYES: conjunctiva are pink and non-injected, sclera clear OROPHARYNX: no exudate, no erythema; lips, buccal mucosa, and tongue normal  NECK: supple, non-tender LUNGS: clear to auscultation with normal breathing effort HEART: regular rate & rhythm and no murmurs and 1+ bilateral lower extremity edema ABDOMEN: soft, non-tender, mildly distended, normal bowel sounds Musculoskeletal: no cyanosis of digits and no clubbing  PSYCH: alert & oriented x 3, fluent speech NEURO: no focal motor/sensory deficits  LABORATORY DATA:  I have reviewed the data as listed    Component Value Date/Time   NA 137 08/20/2018 1010   K 4.7 08/20/2018 1010   CL 103 08/20/2018 1010   CO2 24 08/20/2018 1010   GLUCOSE 222 (H) 08/20/2018 1010   BUN 14 08/20/2018 1010   CREATININE 1.19 08/20/2018 1010   CALCIUM 9.5 08/20/2018 1010   PROT 6.8 08/20/2018 1010   ALBUMIN 4.0 08/20/2018 1010   AST 11 (L) 08/20/2018 1010   ALT 16 08/20/2018 1010   ALKPHOS 92 08/20/2018 1010   BILITOT 0.3 08/20/2018 1010   GFRNONAA >60 08/20/2018 1010   GFRAA >60 08/20/2018 1010    No results found for: SPEP, UPEP  Lab Results  Component Value Date   WBC 13.8 (H) 08/20/2018   NEUTROABS 11.9 (H) 08/20/2018   HGB 10.3 (L) 08/20/2018   HCT 32.6 (L) 08/20/2018   MCV 92.6 08/20/2018   PLT 303 08/20/2018      Chemistry      Component Value Date/Time   NA 137 08/20/2018 1010   K 4.7 08/20/2018 1010   CL 103 08/20/2018 1010   CO2 24 08/20/2018 1010   BUN 14 08/20/2018 1010   CREATININE 1.19 08/20/2018 1010      Component Value Date/Time   CALCIUM 9.5 08/20/2018 1010   ALKPHOS 92 08/20/2018 1010   AST 11 (L) 08/20/2018 1010  ALT 16 08/20/2018 1010   BILITOT 0.3 08/20/2018 1010       RADIOGRAPHIC STUDIES: I have personally  reviewed the radiological images as listed below and agreed with the findings in the report. Dg Chest 2 View  Result Date: 08/10/2018 CLINICAL DATA:  Shortness of breath. EXAM: CHEST - 2 VIEW COMPARISON:  CT scan and radiographs of July 02, 2018. FINDINGS: The heart size is within normal limits. No pneumothorax or pleural effusion is noted. Left internal jugular Port-A-Cath is unchanged in position. Left lung is clear. Stable right perihilar mass is noted. The visualized skeletal structures are unremarkable. IMPRESSION: Stable right perihilar mass. No significant change compared to prior exam. Electronically Signed   By: Marijo Conception M.D.   On: 08/10/2018 08:21   Mr Jeri Cos OX Contrast  Result Date: 07/24/2018 CLINICAL DATA:  Metastatic lung cancer. Restaging. Negative exam previously. EXAM: MRI HEAD WITHOUT AND WITH CONTRAST TECHNIQUE: Multiplanar, multiecho pulse sequences of the brain and surrounding structures were obtained without and with intravenous contrast. CONTRAST:  8 cc Gadavist COMPARISON:  04/18/2018 FINDINGS: Brain: The brain has a normal appearance without evidence of malformation, atrophy, old or acute small or large vessel infarction, mass lesion, hemorrhage, hydrocephalus or extra-axial collection. After contrast administration, no abnormal enhancement occurs. Vascular: Major vessels at the base of the brain show flow. Venous sinuses appear patent. Skull and upper cervical spine: Normal. Sinuses/Orbits: Clear/normal. Other: None significant. IMPRESSION: Normal MRI of the brain.  No evidence of metastatic disease. Electronically Signed   By: Nelson Chimes M.D.   On: 07/24/2018 16:08   US Abdomen Complete  Result Date: 08/13/2018 CLINICAL DATA:  Abdominal swelling for 1-2 months history of lung cancer EXAM: ABDOMEN ULTRASOUND COMPLETE COMPARISON:  CT 07/09/2018 FINDINGS: Gallbladder: No gallstones or wall thickening visualized. No sonographic Murphy sign noted by sonographer. Common bile  duct: Diameter: 2 mm Liver: No focal lesion identified. Within normal limits in parenchymal echogenicity. Portal vein is patent on color Doppler imaging with normal direction of blood flow towards the liver. IVC: No abnormality visualized. Pancreas: Visualized portion unremarkable. Spleen: Size and appearance within normal limits. Right Kidney: Length: 10.3 cm. Echogenicity within normal limits. No mass or hydronephrosis visualized. Left Kidney: Length: 10.3 cm. Echogenicity within normal limits. No mass or hydronephrosis visualized. Abdominal aorta: No aneurysm visualized. Other findings: None. IMPRESSION: Negative abdominal ultrasound Electronically Signed   By: Donavan Foil M.D.   On: 08/13/2018 22:11   Dg Abd 2 Views  Result Date: 08/10/2018 CLINICAL DATA:  Abdominal swelling. EXAM: ABDOMEN - 2 VIEW COMPARISON:  None. FINDINGS: The bowel gas pattern is normal. There is no evidence of free air. No radio-opaque calculi or other significant radiographic abnormality is seen. IMPRESSION: No evidence of bowel obstruction or ileus. Electronically Signed   By: Marijo Conception M.D.   On: 08/10/2018 08:22

## 2018-08-12 ENCOUNTER — Other Ambulatory Visit: Payer: Self-pay | Admitting: *Deleted

## 2018-08-12 ENCOUNTER — Telehealth: Payer: Self-pay | Admitting: *Deleted

## 2018-08-12 NOTE — Telephone Encounter (Signed)
Called pt wth appt for Echo 5/26 3pm at [facility]. Pt c/o swelling to legs, clarified with MD and instructed pt to take lasix 2 tablets daily per MD for leg swelling as needed. Advised pt to call office in 2-3 days if swelling continues or w/any concerns. Pt verbalized understanding

## 2018-08-13 ENCOUNTER — Ambulatory Visit (HOSPITAL_BASED_OUTPATIENT_CLINIC_OR_DEPARTMENT_OTHER): Payer: BLUE CROSS/BLUE SHIELD

## 2018-08-13 ENCOUNTER — Other Ambulatory Visit: Payer: Self-pay

## 2018-08-13 ENCOUNTER — Ambulatory Visit (HOSPITAL_BASED_OUTPATIENT_CLINIC_OR_DEPARTMENT_OTHER)
Admission: RE | Admit: 2018-08-13 | Discharge: 2018-08-13 | Disposition: A | Discharge status: Home / Self Care | Payer: BLUE CROSS/BLUE SHIELD | Source: Ambulatory Visit | Attending: Hematology | Admitting: Hematology

## 2018-08-13 DIAGNOSIS — R1900 Intra-abdominal and pelvic swelling, mass and lump, unspecified site: Secondary | ICD-10-CM | POA: Insufficient documentation

## 2018-08-18 ENCOUNTER — Telehealth: Payer: Self-pay | Admitting: *Deleted

## 2018-08-18 ENCOUNTER — Ambulatory Visit (HOSPITAL_BASED_OUTPATIENT_CLINIC_OR_DEPARTMENT_OTHER)
Admission: RE | Admit: 2018-08-18 | Discharge: 2018-08-18 | Disposition: A | Discharge status: Home / Self Care | Payer: BLUE CROSS/BLUE SHIELD | Source: Ambulatory Visit | Attending: Hematology | Admitting: Hematology

## 2018-08-18 DIAGNOSIS — R0601 Orthopnea: Secondary | ICD-10-CM | POA: Diagnosis not present

## 2018-08-18 NOTE — Telephone Encounter (Signed)
Spoke with pt regarding leg swelling, pt advised he is only taking 1 lasix tablet per day, as directed by the pharmacist. Legs are much better, mild swelling but nothing like before. Reviewed pt appt date and time this week, advised MD will discuss [place] and echo results at this time. No further concerns.

## 2018-08-18 NOTE — Progress Notes (Signed)
  Echocardiogram 2D Echocardiogram has been performed.  Travis [name] 08/18/2018, 3:45 PM

## 2018-08-20 ENCOUNTER — Inpatient Hospital Stay: Payer: BLUE CROSS/BLUE SHIELD

## 2018-08-20 ENCOUNTER — Encounter: Payer: Self-pay | Admitting: Hematology

## 2018-08-20 ENCOUNTER — Other Ambulatory Visit: Payer: Self-pay

## 2018-08-20 ENCOUNTER — Inpatient Hospital Stay (HOSPITAL_BASED_OUTPATIENT_CLINIC_OR_DEPARTMENT_OTHER): Payer: BLUE CROSS/BLUE SHIELD | Admitting: Hematology

## 2018-08-20 VITALS — BP 119/83 | HR 114 | Temp 97.9°F | Resp 19 | Ht 71.0 in | Wt 181.0 lb

## 2018-08-20 DIAGNOSIS — E1142 Type 2 diabetes mellitus with diabetic polyneuropathy: Secondary | ICD-10-CM

## 2018-08-20 DIAGNOSIS — D6481 Anemia due to antineoplastic chemotherapy: Secondary | ICD-10-CM

## 2018-08-20 DIAGNOSIS — D72829 Elevated white blood cell count, unspecified: Secondary | ICD-10-CM | POA: Diagnosis not present

## 2018-08-20 DIAGNOSIS — C3431 Malignant neoplasm of lower lobe, right bronchus or lung: Secondary | ICD-10-CM

## 2018-08-20 DIAGNOSIS — Z79899 Other long term (current) drug therapy: Secondary | ICD-10-CM

## 2018-08-20 DIAGNOSIS — R55 Syncope and collapse: Secondary | ICD-10-CM | POA: Diagnosis not present

## 2018-08-20 DIAGNOSIS — R0601 Orthopnea: Secondary | ICD-10-CM

## 2018-08-20 DIAGNOSIS — R197 Diarrhea, unspecified: Secondary | ICD-10-CM

## 2018-08-20 DIAGNOSIS — T451X5A Adverse effect of antineoplastic and immunosuppressive drugs, initial encounter: Secondary | ICD-10-CM

## 2018-08-20 DIAGNOSIS — R1900 Intra-abdominal and pelvic swelling, mass and lump, unspecified site: Secondary | ICD-10-CM

## 2018-08-20 LAB — CBC WITH DIFFERENTIAL (CANCER CENTER ONLY)
Abs Immature Granulocytes: 0.05 10*3/uL (ref 0.00–0.07)
Basophils Absolute: 0 10*3/uL (ref 0.0–0.1)
Basophils Relative: 0 %
Eosinophils Absolute: 0.1 10*3/uL (ref 0.0–0.5)
Eosinophils Relative: 1 %
HCT: 32.6 % — ABNORMAL LOW (ref 39.0–52.0)
Hemoglobin: 10.3 g/dL — ABNORMAL LOW (ref 13.0–17.0)
Immature Granulocytes: 0 %
Lymphocytes Relative: 4 %
Lymphs Abs: 0.6 10*3/uL — ABNORMAL LOW (ref 0.7–4.0)
MCH: 29.3 pg (ref 26.0–34.0)
MCHC: 31.6 g/dL (ref 30.0–36.0)
MCV: 92.6 fL (ref 80.0–100.0)
Monocytes Absolute: 1.1 10*3/uL — ABNORMAL HIGH (ref 0.1–1.0)
Monocytes Relative: 8 %
Neutro Abs: 11.9 10*3/uL — ABNORMAL HIGH (ref 1.7–7.7)
Neutrophils Relative %: 87 %
Platelet Count: 303 10*3/uL (ref 150–400)
RBC: 3.52 MIL/uL — ABNORMAL LOW (ref 4.22–5.81)
RDW: 14.2 % (ref 11.5–15.5)
WBC Count: 13.8 10*3/uL — ABNORMAL HIGH (ref 4.0–10.5)
nRBC: 0.1 % (ref 0.0–0.2)

## 2018-08-20 LAB — CMP (CANCER CENTER ONLY)
ALT: 16 U/L (ref 0–44)
AST: 11 U/L — ABNORMAL LOW (ref 15–41)
Albumin: 4 g/dL (ref 3.5–5.0)
Alkaline Phosphatase: 92 U/L (ref 38–126)
Anion gap: 10 (ref 5–15)
BUN: 14 mg/dL (ref 8–23)
CO2: 24 mmol/L (ref 22–32)
Calcium: 9.5 mg/dL (ref 8.9–10.3)
Chloride: 103 mmol/L (ref 98–111)
Creatinine: 1.19 mg/dL (ref 0.61–1.24)
GFR, Est AFR Am: >60 mL/min (ref >60)
GFR, Estimated: >60 mL/min (ref >60)
Glucose, Bld: 222 mg/dL — ABNORMAL HIGH (ref 70–99)
Potassium: 4.7 mmol/L (ref 3.5–5.1)
Sodium: 137 mmol/L (ref 135–145)
Total Bilirubin: 0.3 mg/dL (ref 0.3–1.2)
Total Protein: 6.8 g/dL (ref 6.5–8.1)

## 2018-08-20 LAB — MAGNESIUM: Magnesium: 1.8 mg/dL (ref 1.7–2.4)

## 2018-08-20 MED ORDER — HEPARIN SOD (PORK) LOCK FLUSH 100 UNIT/ML IV SOLN
500.0000 [IU] | Freq: Once | INTRAVENOUS | Status: AC | PRN
  Administered 2018-08-20: 500 [IU]
  Filled 2018-08-20: qty 5

## 2018-08-20 MED ORDER — SODIUM CHLORIDE 0.9 % IV SOLN
Freq: Once | INTRAVENOUS | Status: AC
  Administered 2018-08-20: 11:00:00 via INTRAVENOUS
  Filled 2018-08-20: qty 250

## 2018-08-20 MED ORDER — SODIUM CHLORIDE 0.9% FLUSH
10.0000 mL | INTRAVENOUS | Status: DC | PRN
  Administered 2018-08-20: 10 mL
  Filled 2018-08-20: qty 10

## 2018-08-20 MED ORDER — SODIUM CHLORIDE 0.9 % IV SOLN
740.0000 mg | Freq: Once | INTRAVENOUS | Status: AC
  Administered 2018-08-20: 740 mg via INTRAVENOUS
  Filled 2018-08-20: qty 10

## 2018-08-20 NOTE — Progress Notes (Signed)
MD aware of pt HR, ok to treat.

## 2018-08-20 NOTE — Patient Instructions (Signed)
[facility] Discharge Instructions for Patients Receiving Chemotherapy  Today you received the following chemotherapy agents:  Imfinzi  To help prevent nausea and vomiting after your treatment, we encourage you to take your nausea medication as ordered per MD.    If you develop nausea and vomiting that is not controlled by your nausea medication, call the clinic.   BELOW ARE SYMPTOMS THAT SHOULD BE REPORTED IMMEDIATELY:  *FEVER GREATER THAN 100.5 F  *CHILLS WITH OR WITHOUT FEVER  NAUSEA AND VOMITING THAT IS NOT CONTROLLED WITH YOUR NAUSEA MEDICATION  *UNUSUAL SHORTNESS OF BREATH  *UNUSUAL BRUISING OR BLEEDING  TENDERNESS IN MOUTH AND THROAT WITH OR WITHOUT PRESENCE OF ULCERS  *URINARY PROBLEMS  *BOWEL PROBLEMS  UNUSUAL RASH Items with * indicate a potential emergency and should be followed up as soon as possible.  Feel free to call the clinic should you have any questions or concerns. The clinic phone number is [phone].  Please show the [facility] at check-in to the Emergency Department and triage nurse.

## 2018-09-03 ENCOUNTER — Ambulatory Visit: Payer: BLUE CROSS/BLUE SHIELD

## 2018-09-03 ENCOUNTER — Other Ambulatory Visit: Payer: BLUE CROSS/BLUE SHIELD

## 2018-09-03 NOTE — Progress Notes (Signed)
[facility] OFFICE PROGRESS NOTE  Patient Care Team: Brock Ra, PA-C as PCP - General Cordelia Poche, RN as Oncology Nurse Navigator Tish Men, MD as Medical Oncologist (Hematology)  HEME/ONC OVERVIEW: 1. Stage IIIA [phone]) adenocarcinoma of the right lung  -03/2018: CT CAP showed a 4.2 x 3.3 x 3.1cm RLL mass extending to RUL, additional small noncalcified nodules throughout the right lung; no metastatic disease in the abdomen/pelvis   PET showed FDG-avid RLL mass 5.3 x 3.6cm and a satellite nodule in RLL; borderline FDG-avid (SUV 2.6) paratracheal LN   MRI brain negative for mets   EBUS w/ bx of the RLL mass showed poorly differentiated adenoCa with features of squamous cell differentiation; 4R LN negative for malignancy -04/2018 - 06/2018: definitive chemoradiation with weekly carbo/Taxol x 7 cycles  -Mid-06/2018: post-treatment CT CAP showed improving disease in the RLL mass as well adjacent small satellite nodules; no disease progression or mets  -Late 06/2018 - present: consolidation durvalumab   2. Port placed in 04/2018  TREATMENT REGIMEN:  05/08/2018 - 06/25/2018: weekly carbo/Taxol concurrent with radiation x 7 cycles  07/23/2018 - present: consolidation durvalumab   PERTINENT NON-HEM/ONC PROBLEMS: 1. Hx of tobacco use, quit in early 03/2018  ASSESSMENT & PLAN:   Stage IIIA ([id number]) adenocarcinoma of the right lung  -S/p 3 cycles of consolidation Imfinzi -Labs adequate today, proceed with Cycle 4 of chemotherapy -I have ordered CT CAP w/ contrast to assess interim disease response after treatment today  -We will plan to obtain ~q81month surveillance scans to monitor for any disease progression while on consolidative immunotherapy  -Monitoring:  ~q41month surveillance scans to monitor for any disease progression while on consolidative immunotherapy  Periodic thyroid functioning monitoring  -PRN anti-emetics: Zofran, Compazine, Ativan and  dexamethasone -PRN anti-diarrheals: Imodium  Exertional dyspnea  -Likely due to deconditioning and radiation-associated lung injury -No significant pleural effusion on CXR; LVEF normal and mild HFpEF on TTE; trivial pericardial effusion; no ascites on abdominal US -CT CAP as above to assess for any new pulmonary abnormalities  -Continue Lasix 40mg  daily PRN for now   Chemotherapy-associated anemia -Secondary to chemotherapy -Hgb 10.4, stable -Patient denies any symptom of bleeding -We will monitor for now  Peripheral neuropathy -Secondary to underlying DM, exacerbated by chemotherapy -On gabapentin 600mg  BID with improvement -Continue gabapentin  Bilateral knee pain -Most likely due to osteoarthritis -I encouraged the patient to try OTC Tylenol; if no improvement, I have prescribed PRN tramadol -I also recommended the patient to follow up with his PCP for further management   Orders Placed This Encounter  Procedures  . CT CHEST W CONTRAST    Standing Status:   Future    Standing Expiration Date:   09/04/2019    Order Specific Question:   ** REASON FOR EXAM (FREE TEXT)    Answer:   Stage III lung cancer s/p chemoRT, on immunotherapy; monitor disease, assess dyspnea    Order Specific Question:   If indicated for the ordered procedure, I authorize the administration of contrast media per Radiology protocol    Answer:   Yes    Order Specific Question:   Preferred imaging location?    Answer:   [facility] Specific Question:   Radiology Contrast Protocol - do NOT remove file path    Answer:   \\[email or web address].[email or web address]  . CT ABDOMEN PELVIS W CONTRAST    Standing Status:   Future    Standing Expiration Date:   09/04/2019  Order Specific Question:   ** REASON FOR EXAM (FREE TEXT)    Answer:   Stage III lung cancer s/p chemoRT, on immunotherapy; monitor disease response    Order Specific Question:   If indicated for the ordered procedure, I  authorize the administration of contrast media per Radiology protocol    Answer:   Yes    Order Specific Question:   Preferred imaging location?    Answer:   [facility] Specific Question:   Is Oral Contrast requested for this exam?    Answer:   Yes, Per Radiology protocol    Order Specific Question:   Radiology Contrast Protocol - do NOT remove file path    Answer:   \\[email or web address]    All questions were answered. The patient knows to call the clinic with any problems, questions or concerns. No barriers to learning was detected.  Return in 2 weeks for labs, port flush, imaging results and clinic appt prior to Cycle 5 of immunotherapy.  Tish Men, MD 09/04/2018 10:31 AM  CHIEF COMPLAINT: "I am still short of breath"  INTERVAL HISTORY: [name] return to clinic for follow-up of lung cancer on consolidative immunotherapy.  He reports that he still has mild persistent exertional shortness of breath, exacerbated by strenuous activities at work, but overall it has been unchanged.  He denies any associated chest pain, palpitation, or diaphoresis.  He also reports bilateral knee pain over the past few weeks, for which he had been evaluated by his PCP, who recommended conservative management.  He also reports fatigue on immunotherapy, as well as bilateral lower extremity swelling, but has been improving on Lasix.  He still works part-time Programmer, systems and driving a truck, but he is trying to apply for disability due to his dyspnea limiting his ability to work.  He denies any other complaint today.  SUMMARY OF ONCOLOGIC HISTORY: Oncology History  Lung cancer ([place])  03/31/2018 Imaging   CT CAP with contrast: IMPRESSION: 1. 4.2 x 3.3 x 3.1 cm irregularly marginated mass within the superior segment of the right lower lobe appearing to extend across the fissure into the posterior inferior right upper lobe and extending toward the right hilum  where the right upper lobe superior segment bronchus is occluded, consistent with primary lung carcinoma. 2. Small noncalcified nodules more numerous throughout the right lung most consistent with lung metastases. 3. No metastatic involvement of the abdomen or pelvis by CT. 4. Moderate thoracic and abdominal aortic atherosclerosis.   04/03/2018 Initial Diagnosis   Lung cancer ([place])   05/08/2018 - 06/25/2018 Chemotherapy   The patient had palonosetron (ALOXI) injection 0.25 mg, 0.25 mg, Intravenous,  Once, 7 of 7 cycles Administration: 0.25 mg (05/08/2018), 0.25 mg (06/05/2018), 0.25 mg (06/12/2018), 0.25 mg (05/15/2018), 0.25 mg (06/19/2018), 0.25 mg (05/22/2018), 0.25 mg (05/29/2018) CARBOplatin (PARAPLATIN) 200 mg in sodium chloride 0.9 % 250 mL chemo infusion, 200 mg (92.5 % of original dose 215.8 mg), Intravenous,  Once, 7 of 7 cycles Dose modification:   (original dose 215.8 mg, Cycle 1) Administration: 200 mg (05/08/2018), 210 mg (06/05/2018), 230 mg (06/12/2018), 210 mg (05/15/2018), 200 mg (06/19/2018), 180 mg (05/22/2018), 230 mg (05/29/2018) PACLitaxel (TAXOL) 90 mg in sodium chloride 0.9 % 250 mL chemo infusion (</= 80mg /m2), 45 mg/m2 = 90 mg, Intravenous,  Once, 7 of 7 cycles Administration: 90 mg (05/08/2018), 90 mg (06/05/2018), 90 mg (06/12/2018), 90 mg (05/15/2018), 90 mg (06/19/2018), 90 mg (05/22/2018), 90 mg (05/29/2018)  for chemotherapy  treatment.    06/05/2018 Cancer Staging   Staging form: Lung, AJCC 8th Edition - Clinical: Stage IIIA (cT4, cN0, cM0) - Signed by Tish Men, MD on 06/05/2018   07/02/2018 Imaging   CTA chest: IMPRESSION: 1.  Negative examination for pulmonary embolism.  2. Interval decrease in size of a spiculated perihilar mass of the right lower lobe with multiple adjacent small satellite nodules. This mass measures approximately 3.6 x 2.4 cm, previously 5.3 x 3.6 cm when measured similarly. Nodules measure approximately 4 mm and are unchanged from prior. Findings are  consistent with treatment response. No evidence of distant metastatic disease in the chest or partially included upper abdomen.   07/23/2018 -  Chemotherapy   The patient had durvalumab (IMFINZI) 740 mg in sodium chloride 0.9 % 100 mL chemo infusion, 9.5 mg/kg = 780 mg, Intravenous,  Once, 3 of 10 cycles Administration: 740 mg (07/23/2018), 740 mg (08/07/2018), 740 mg (08/20/2018)  for chemotherapy treatment.      REVIEW OF SYSTEMS:   Constitutional: ( - ) fevers, ( - )  chills , ( - ) night sweats Eyes: ( - ) blurriness of vision, ( - ) double vision, ( - ) watery eyes Ears, nose, mouth, throat, and face: ( - ) mucositis, ( - ) sore throat Respiratory: ( - ) cough, ( + ) dyspnea, ( - ) wheezes Cardiovascular: ( - ) palpitation, ( - ) chest discomfort, ( + ) lower extremity swelling Gastrointestinal:  ( - ) nausea, ( - ) heartburn, ( - ) change in bowel habits Skin: ( - ) abnormal skin rashes Lymphatics: ( - ) new lymphadenopathy, ( - ) easy bruising Neurological: ( - ) numbness, ( - ) tingling, ( - ) new weaknesses Behavioral/Psych: ( - ) mood change, ( - ) new changes  All other systems were reviewed with the patient and are negative.  I have reviewed the past medical history, past surgical history, social history and family history with the patient and they are unchanged from previous note.  ALLERGIES:  has No Known Allergies.  MEDICATIONS:  Current Outpatient Medications  Medication Sig Dispense Refill  . albuterol (VENTOLIN HFA) 108 (90 Base) MCG/ACT inhaler Inhale 2 puffs into the lungs every 6 (six) hours as needed for wheezing or shortness of breath. 1 Inhaler 5  . amitriptyline (ELAVIL) 25 MG tablet Take 50 mg by mouth at bedtime.    Marland Kitchen amLODipine (NORVASC) 5 MG tablet Take 5 mg by mouth daily.    . budesonide-formoterol (SYMBICORT) 160-4.5 MCG/ACT inhaler Inhale 2 puffs into the lungs 2 (two) times daily. 1 Inhaler 12  . diclofenac (VOLTAREN) 75 MG EC tablet Take 75 mg by  mouth 2 (two) times daily.    . furosemide (LASIX) 40 MG tablet Take 1 tablet (40 mg total) by mouth daily for 30 days. 30 tablet 3  . gabapentin (NEURONTIN) 300 MG capsule Take 2 capsules (600 mg total) by mouth 2 (two) times daily for 30 days. 120 capsule 5  . glimepiride (AMARYL) 2 MG tablet TAKE 1 TABLET BY MOUTH ONCE DAILY BEFORE BREAKFAST    . glucose blood test strip 4x a day 150 each 12  . insulin aspart (NOVOLOG FLEXPEN) 100 UNIT/ML FlexPen Inject 6 Units into the skin 3 (three) times daily with meals. 15 mL 11  . Insulin Glargine (LANTUS) 100 UNIT/ML Solostar Pen Inject 19 Units into the skin at bedtime for 30 days. 15 mL 3  . Insulin Pen Needle (BD  PEN NEEDLE NANO U/F) 32G X 4 MM MISC 4x daily 150 each 6  . lidocaine (XYLOCAINE) 2 % solution Patient: Mix 1part 2% viscous lidocaine, 1part H20. Swish & swallow 23mL of diluted mixture, 27min before meals and at bedtime, up to QID 100 mL 5  . lidocaine-prilocaine (EMLA) cream Apply a dime size of cream to port one- two hours prior to access. Cover with Saran wrap. 30 g 4  . magnesium oxide (MAG-OX) 400 (241.3 Mg) MG tablet Take 1 tablet (400 mg total) by mouth 2 (two) times daily for 30 days. 60 tablet 5  . metFORMIN (GLUCOPHAGE-XR) 500 MG 24 hr tablet Take 2 tablets (1,000 mg total) by mouth 2 (two) times daily. 360 tablet 3  . mometasone (ELOCON) 0.1 % cream Apply thin layer to affected areas bid prn    . sucralfate (CARAFATE) 1 g tablet Take 1 tablet (1 g total) by mouth 4 (four) times daily. 120 tablet 2  . traMADol (ULTRAM) 50 MG tablet Take 1 tablet (50 mg total) by mouth every 8 (eight) hours as needed for moderate pain. 60 tablet 1   No current facility-administered medications for this visit.    Facility-Administered Medications Ordered in Other Visits  Medication Dose Route Frequency Provider Last Rate Last Dose  . magnesium sulfate 2 g in sodium chloride 0.9 % 250 mL  2 g Intravenous Once Tish Men, MD        PHYSICAL  EXAMINATION: ECOG PERFORMANCE STATUS: 2 - Symptomatic, <50% confined to bed  Today's Vitals   09/04/18 1000 09/04/18 1015  BP: 129/78   Pulse: 91   Resp: 18   SpO2: 100%   Weight: 187 lb 1.9 oz (84.9 kg)   Height: 5\' 11"  (1.803 m)   PainSc:  2    Body mass index is 26.1 kg/m.  Filed Weights   09/04/18 1000  Weight: 187 lb 1.9 oz (84.9 kg)    GENERAL: alert, no distress and comfortable SKIN: skin color, texture, turgor are normal, no rashes or significant lesions EYES: conjunctiva are pink and non-injected, sclera clear OROPHARYNX: no exudate, no erythema; lips, buccal mucosa, and tongue normal  NECK: supple, non-tender LUNGS: clear to auscultation with normal breathing effort HEART: regular rate & rhythm and no murmurs and trace bilateral lower extremity edema ABDOMEN: soft, non-tender, non-distended, normal bowel sounds Musculoskeletal: no cyanosis of digits and no clubbing  PSYCH: alert & oriented x 3, fluent speech NEURO: no focal motor/sensory deficits  LABORATORY DATA:  I have reviewed the data as listed    Component Value Date/Time   NA 137 08/20/2018 1010   K 4.7 08/20/2018 1010   CL 103 08/20/2018 1010   CO2 24 08/20/2018 1010   GLUCOSE 222 (H) 08/20/2018 1010   BUN 14 08/20/2018 1010   CREATININE 1.19 08/20/2018 1010   CALCIUM 9.5 08/20/2018 1010   PROT 6.8 08/20/2018 1010   ALBUMIN 4.0 08/20/2018 1010   AST 11 (L) 08/20/2018 1010   ALT 16 08/20/2018 1010   ALKPHOS 92 08/20/2018 1010   BILITOT 0.3 08/20/2018 1010   GFRNONAA >60 08/20/2018 1010   GFRAA >60 08/20/2018 1010    No results found for: SPEP, UPEP  Lab Results  Component Value Date   WBC 6.3 09/04/2018   NEUTROABS 4.1 09/04/2018   HGB 10.4 (L) 09/04/2018   HCT 32.2 (L) 09/04/2018   MCV 91.2 09/04/2018   PLT 257 09/04/2018      Chemistry      Component  Value Date/Time   NA 137 08/20/2018 1010   K 4.7 08/20/2018 1010   CL 103 08/20/2018 1010   CO2 24 08/20/2018 1010   BUN 14  08/20/2018 1010   CREATININE 1.19 08/20/2018 1010      Component Value Date/Time   CALCIUM 9.5 08/20/2018 1010   ALKPHOS 92 08/20/2018 1010   AST 11 (L) 08/20/2018 1010   ALT 16 08/20/2018 1010   BILITOT 0.3 08/20/2018 1010       RADIOGRAPHIC STUDIES: I have personally reviewed the radiological images as listed below and agreed with the findings in the report. Dg Chest 2 View  Result Date: 08/10/2018 CLINICAL DATA:  Shortness of breath. EXAM: CHEST - 2 VIEW COMPARISON:  CT scan and radiographs of July 02, 2018. FINDINGS: The heart size is within normal limits. No pneumothorax or pleural effusion is noted. Left internal jugular Port-A-Cath is unchanged in position. Left lung is clear. Stable right perihilar mass is noted. The visualized skeletal structures are unremarkable. IMPRESSION: Stable right perihilar mass. No significant change compared to prior exam. Electronically Signed   By: Marijo Conception M.D.   On: 08/10/2018 08:21   US Abdomen Complete  Result Date: 08/13/2018 CLINICAL DATA:  Abdominal swelling for 1-2 months history of lung cancer EXAM: ABDOMEN ULTRASOUND COMPLETE COMPARISON:  CT 07/09/2018 FINDINGS: Gallbladder: No gallstones or wall thickening visualized. No sonographic Murphy sign noted by sonographer. Common bile duct: Diameter: 2 mm Liver: No focal lesion identified. Within normal limits in parenchymal echogenicity. Portal vein is patent on color Doppler imaging with normal direction of blood flow towards the liver. IVC: No abnormality visualized. Pancreas: Visualized portion unremarkable. Spleen: Size and appearance within normal limits. Right Kidney: Length: 10.3 cm. Echogenicity within normal limits. No mass or hydronephrosis visualized. Left Kidney: Length: 10.3 cm. Echogenicity within normal limits. No mass or hydronephrosis visualized. Abdominal aorta: No aneurysm visualized. Other findings: None. IMPRESSION: Negative abdominal ultrasound Electronically Signed   By:  Donavan Foil M.D.   On: 08/13/2018 22:11   Dg Abd 2 Views  Result Date: 08/10/2018 CLINICAL DATA:  Abdominal swelling. EXAM: ABDOMEN - 2 VIEW COMPARISON:  None. FINDINGS: The bowel gas pattern is normal. There is no evidence of free air. No radio-opaque calculi or other significant radiographic abnormality is seen. IMPRESSION: No evidence of bowel obstruction or ileus. Electronically Signed   By: Marijo Conception M.D.   On: 08/10/2018 08:22

## 2018-09-04 ENCOUNTER — Inpatient Hospital Stay: Payer: BLUE CROSS/BLUE SHIELD | Attending: Hematology

## 2018-09-04 ENCOUNTER — Inpatient Hospital Stay: Payer: BLUE CROSS/BLUE SHIELD

## 2018-09-04 ENCOUNTER — Other Ambulatory Visit: Payer: Self-pay

## 2018-09-04 ENCOUNTER — Encounter: Payer: Self-pay | Admitting: Hematology

## 2018-09-04 ENCOUNTER — Inpatient Hospital Stay (HOSPITAL_BASED_OUTPATIENT_CLINIC_OR_DEPARTMENT_OTHER): Payer: BLUE CROSS/BLUE SHIELD | Admitting: Hematology

## 2018-09-04 VITALS — BP 129/78 | HR 91 | Resp 18 | Ht 71.0 in | Wt 187.1 lb

## 2018-09-04 DIAGNOSIS — E1142 Type 2 diabetes mellitus with diabetic polyneuropathy: Secondary | ICD-10-CM

## 2018-09-04 DIAGNOSIS — M25562 Pain in left knee: Secondary | ICD-10-CM | POA: Insufficient documentation

## 2018-09-04 DIAGNOSIS — C3431 Malignant neoplasm of lower lobe, right bronchus or lung: Secondary | ICD-10-CM

## 2018-09-04 DIAGNOSIS — R0609 Other forms of dyspnea: Secondary | ICD-10-CM

## 2018-09-04 DIAGNOSIS — D6481 Anemia due to antineoplastic chemotherapy: Secondary | ICD-10-CM

## 2018-09-04 DIAGNOSIS — M25561 Pain in right knee: Secondary | ICD-10-CM | POA: Insufficient documentation

## 2018-09-04 DIAGNOSIS — Z79899 Other long term (current) drug therapy: Secondary | ICD-10-CM | POA: Diagnosis not present

## 2018-09-04 DIAGNOSIS — G8929 Other chronic pain: Secondary | ICD-10-CM

## 2018-09-04 DIAGNOSIS — T451X5A Adverse effect of antineoplastic and immunosuppressive drugs, initial encounter: Secondary | ICD-10-CM

## 2018-09-04 DIAGNOSIS — Z5112 Encounter for antineoplastic immunotherapy: Secondary | ICD-10-CM | POA: Insufficient documentation

## 2018-09-04 LAB — CMP (CANCER CENTER ONLY)
ALT: 19 U/L (ref 0–44)
AST: 12 U/L — ABNORMAL LOW (ref 15–41)
Albumin: 4.1 g/dL (ref 3.5–5.0)
Alkaline Phosphatase: 77 U/L (ref 38–126)
Anion gap: 8 (ref 5–15)
BUN: 13 mg/dL (ref 8–23)
CO2: 27 mmol/L (ref 22–32)
Calcium: 9.4 mg/dL (ref 8.9–10.3)
Chloride: 105 mmol/L (ref 98–111)
Creatinine: 1.06 mg/dL (ref 0.61–1.24)
GFR, Est AFR Am: >60 mL/min (ref >60)
GFR, Estimated: >60 mL/min (ref >60)
Glucose, Bld: 168 mg/dL — ABNORMAL HIGH (ref 70–99)
Potassium: 3.8 mmol/L (ref 3.5–5.1)
Sodium: 140 mmol/L (ref 135–145)
Total Bilirubin: 0.3 mg/dL (ref 0.3–1.2)
Total Protein: 6.3 g/dL — ABNORMAL LOW (ref 6.5–8.1)

## 2018-09-04 LAB — CBC WITH DIFFERENTIAL (CANCER CENTER ONLY)
Abs Immature Granulocytes: 0.03 10*3/uL (ref 0.00–0.07)
Basophils Absolute: 0 10*3/uL (ref 0.0–0.1)
Basophils Relative: 1 %
Eosinophils Absolute: 0.2 10*3/uL (ref 0.0–0.5)
Eosinophils Relative: 4 %
HCT: 32.2 % — ABNORMAL LOW (ref 39.0–52.0)
Hemoglobin: 10.4 g/dL — ABNORMAL LOW (ref 13.0–17.0)
Immature Granulocytes: 1 %
Lymphocytes Relative: 18 %
Lymphs Abs: 1.2 10*3/uL (ref 0.7–4.0)
MCH: 29.5 pg (ref 26.0–34.0)
MCHC: 32.3 g/dL (ref 30.0–36.0)
MCV: 91.2 fL (ref 80.0–100.0)
Monocytes Absolute: 0.8 10*3/uL (ref 0.1–1.0)
Monocytes Relative: 13 %
Neutro Abs: 4.1 10*3/uL (ref 1.7–7.7)
Neutrophils Relative %: 63 %
Platelet Count: 257 10*3/uL (ref 150–400)
RBC: 3.53 MIL/uL — ABNORMAL LOW (ref 4.22–5.81)
RDW: 14 % (ref 11.5–15.5)
WBC Count: 6.3 10*3/uL (ref 4.0–10.5)
nRBC: 0 % (ref 0.0–0.2)

## 2018-09-04 LAB — MAGNESIUM: Magnesium: 2 mg/dL (ref 1.7–2.4)

## 2018-09-04 MED ORDER — SODIUM CHLORIDE 0.9 % IV SOLN
9.5000 mg/kg | Freq: Once | INTRAVENOUS | Status: AC
  Administered 2018-09-04: 740 mg via INTRAVENOUS
  Filled 2018-09-04: qty 10

## 2018-09-04 MED ORDER — SODIUM CHLORIDE 0.9 % IV SOLN
Freq: Once | INTRAVENOUS | Status: AC
  Administered 2018-09-04: 11:00:00 via INTRAVENOUS
  Filled 2018-09-04: qty 250

## 2018-09-04 MED ORDER — TRAMADOL HCL 50 MG PO TABS: 50.0000 mg | ORAL_TABLET | Freq: Three times a day (TID) | ORAL | 1 refills | Status: DC | PRN

## 2018-09-04 MED ORDER — SODIUM CHLORIDE 0.9% FLUSH
10.0000 mL | INTRAVENOUS | Status: DC | PRN
  Administered 2018-09-04: 13:00:00 10 mL
  Filled 2018-09-04: qty 10

## 2018-09-04 MED ORDER — HEPARIN SOD (PORK) LOCK FLUSH 100 UNIT/ML IV SOLN
500.0000 [IU] | Freq: Once | INTRAVENOUS | Status: AC | PRN
  Administered 2018-09-04: 500 [IU]
  Filled 2018-09-04: qty 5

## 2018-09-04 NOTE — Patient Instructions (Signed)
[facility] Discharge Instructions for Patients Receiving Chemotherapy  Today you received the following chemotherapy agents Imfinzi To help prevent nausea and vomiting after your treatment, we encourage you to take your nausea medication as prescribed.  If you develop nausea and vomiting that is not controlled by your nausea medication, call the clinic.   BELOW ARE SYMPTOMS THAT SHOULD BE REPORTED IMMEDIATELY:  *FEVER GREATER THAN 100.5 F  *CHILLS WITH OR WITHOUT FEVER  NAUSEA AND VOMITING THAT IS NOT CONTROLLED WITH YOUR NAUSEA MEDICATION  *UNUSUAL SHORTNESS OF BREATH  *UNUSUAL BRUISING OR BLEEDING  TENDERNESS IN MOUTH AND THROAT WITH OR WITHOUT PRESENCE OF ULCERS  *URINARY PROBLEMS  *BOWEL PROBLEMS  UNUSUAL RASH Items with * indicate a potential emergency and should be followed up as soon as possible.  Feel free to call the clinic should you have any questions or concerns. The clinic phone number is [phone].  Please show the [facility] at check-in to the Emergency Department and triage nurse.

## 2018-09-04 NOTE — Patient Instructions (Signed)

## 2018-09-08 ENCOUNTER — Telehealth: Payer: Self-pay | Admitting: Hematology

## 2018-09-08 ENCOUNTER — Other Ambulatory Visit: Payer: Self-pay | Admitting: Hematology

## 2018-09-08 DIAGNOSIS — C3431 Malignant neoplasm of lower lobe, right bronchus or lung: Secondary | ICD-10-CM

## 2018-09-08 NOTE — Telephone Encounter (Signed)
I called patient to let him know that we had scheduled him an appointment with [facility] due to his insurance.  I did ask that he call me back today to confirm appointment for 6/17 @ 9:00.  Records have been faxed to [phone].

## 2018-09-08 NOTE — Telephone Encounter (Signed)
Patient called and confirmed he would be able to attend 6/17 appt @ 9:00 @ [facility] w/ Dr Maxie Better. I lm with detailed information and location w/ phone number was given. Per Dr Maylon Peppers OK to cancel orders for CT's

## 2018-09-09 ENCOUNTER — Ambulatory Visit (HOSPITAL_BASED_OUTPATIENT_CLINIC_OR_DEPARTMENT_OTHER): Payer: BLUE CROSS/BLUE SHIELD

## 2018-09-09 ENCOUNTER — Encounter: Payer: Self-pay | Admitting: *Deleted

## 2018-09-09 NOTE — Progress Notes (Signed)
Patient's insurance will not cover care in our health system and patient must transfer care to the [facility] care system. Referral placed on 09/08/18, and appointment was able to be made for today. Patient established care with Dr Maxie Better today and will receive her next treatment with his office.  Called patient and confirmed all the above. He has his appointment scheduled at [facility] for his next treatment. All appointment with our office cancelled. Patient knows that if needed, he can return to our office at any time.   Will discontinue active navigation with this patient.

## 2018-09-17 ENCOUNTER — Ambulatory Visit: Payer: BLUE CROSS/BLUE SHIELD | Admitting: Hematology

## 2018-09-17 ENCOUNTER — Other Ambulatory Visit: Payer: BLUE CROSS/BLUE SHIELD

## 2018-09-17 ENCOUNTER — Ambulatory Visit: Payer: BLUE CROSS/BLUE SHIELD

## 2018-09-18 ENCOUNTER — Ambulatory Visit: Payer: BLUE CROSS/BLUE SHIELD

## 2018-09-18 ENCOUNTER — Ambulatory Visit: Payer: BLUE CROSS/BLUE SHIELD | Admitting: Hematology

## 2018-09-18 ENCOUNTER — Other Ambulatory Visit: Payer: BLUE CROSS/BLUE SHIELD

## 2018-09-24 ENCOUNTER — Telehealth: Payer: Self-pay | Admitting: Emergency Medicine

## 2018-09-24 NOTE — Telephone Encounter (Signed)
Called pt back who advised he had a CT scan last week and would like results. Pt currently being treated at [facility] everywhere shows CT scan done 6/26. Discussed with pt to call Dr., Maxie Better office regarding results. Pt has a f/u 7/10. Pt verbalized understanding. No further concerns.

## 2018-09-24 NOTE — Telephone Encounter (Signed)
Pt left VM stating "I still haven't heard about my Xray".  No Xray scheduled soon or performed recently.  Pt was supposed to have CT scans scheduled and performed over 2 weeks ago, no evidence in chart they were done.  MD Maylon Peppers not on site today.  Desk RN Karen Chafe given this information, states she will follow up with the patient today.

## 2018-10-01 ENCOUNTER — Other Ambulatory Visit: Payer: BLUE CROSS/BLUE SHIELD

## 2018-10-01 ENCOUNTER — Ambulatory Visit: Payer: BLUE CROSS/BLUE SHIELD | Admitting: Hematology

## 2018-10-01 ENCOUNTER — Ambulatory Visit: Payer: BLUE CROSS/BLUE SHIELD

## 2018-10-02 ENCOUNTER — Other Ambulatory Visit: Payer: BLUE CROSS/BLUE SHIELD

## 2018-10-02 ENCOUNTER — Ambulatory Visit: Payer: BLUE CROSS/BLUE SHIELD | Admitting: Hematology

## 2018-10-02 ENCOUNTER — Ambulatory Visit: Payer: BLUE CROSS/BLUE SHIELD

## 2018-10-16 ENCOUNTER — Ambulatory Visit: Payer: BLUE CROSS/BLUE SHIELD

## 2018-10-16 ENCOUNTER — Ambulatory Visit: Payer: BLUE CROSS/BLUE SHIELD | Admitting: Hematology

## 2018-10-16 ENCOUNTER — Other Ambulatory Visit: Payer: BLUE CROSS/BLUE SHIELD

## 2018-10-29 ENCOUNTER — Ambulatory Visit: Payer: BLUE CROSS/BLUE SHIELD | Admitting: Hematology

## 2018-10-29 ENCOUNTER — Other Ambulatory Visit: Payer: BLUE CROSS/BLUE SHIELD

## 2018-10-29 ENCOUNTER — Ambulatory Visit: Payer: BLUE CROSS/BLUE SHIELD

## 2018-10-30 ENCOUNTER — Ambulatory Visit: Payer: BLUE CROSS/BLUE SHIELD | Admitting: Hematology

## 2018-10-30 ENCOUNTER — Other Ambulatory Visit: Payer: BLUE CROSS/BLUE SHIELD

## 2018-10-30 ENCOUNTER — Ambulatory Visit: Payer: BLUE CROSS/BLUE SHIELD

## 2018-11-02 ENCOUNTER — Other Ambulatory Visit: Payer: Self-pay | Admitting: Hematology

## 2018-11-02 DIAGNOSIS — R0601 Orthopnea: Secondary | ICD-10-CM

## 2018-11-12 ENCOUNTER — Other Ambulatory Visit: Payer: BLUE CROSS/BLUE SHIELD

## 2018-11-12 ENCOUNTER — Ambulatory Visit: Payer: BLUE CROSS/BLUE SHIELD

## 2018-11-12 ENCOUNTER — Ambulatory Visit: Payer: BLUE CROSS/BLUE SHIELD | Admitting: Hematology

## 2018-11-13 ENCOUNTER — Other Ambulatory Visit: Payer: BLUE CROSS/BLUE SHIELD

## 2018-11-13 ENCOUNTER — Ambulatory Visit: Payer: BLUE CROSS/BLUE SHIELD | Admitting: Hematology

## 2018-11-13 ENCOUNTER — Ambulatory Visit: Payer: BLUE CROSS/BLUE SHIELD

## 2018-12-16 ENCOUNTER — Other Ambulatory Visit: Payer: Self-pay | Admitting: Hematology

## 2018-12-16 DIAGNOSIS — G629 Polyneuropathy, unspecified: Secondary | ICD-10-CM

## 2018-12-16 DIAGNOSIS — R0601 Orthopnea: Secondary | ICD-10-CM

## 2018-12-18 ENCOUNTER — Other Ambulatory Visit: Payer: Self-pay | Admitting: Hematology

## 2018-12-18 DIAGNOSIS — G629 Polyneuropathy, unspecified: Secondary | ICD-10-CM

## 2018-12-18 DIAGNOSIS — R0601 Orthopnea: Secondary | ICD-10-CM

## 2018-12-25 ENCOUNTER — Other Ambulatory Visit: Payer: Self-pay | Admitting: Hematology

## 2018-12-25 DIAGNOSIS — G629 Polyneuropathy, unspecified: Secondary | ICD-10-CM

## 2019-01-27 ENCOUNTER — Other Ambulatory Visit: Payer: Self-pay | Admitting: Hematology

## 2019-01-27 DIAGNOSIS — G629 Polyneuropathy, unspecified: Secondary | ICD-10-CM

## 2019-02-23 ENCOUNTER — Emergency Department (HOSPITAL_BASED_OUTPATIENT_CLINIC_OR_DEPARTMENT_OTHER)
Admission: EM | Admit: 2019-02-23 | Discharge: 2019-02-23 | Disposition: A | Discharge status: Home / Self Care | Payer: BLUE CROSS/BLUE SHIELD | Attending: Emergency Medicine | Admitting: Emergency Medicine

## 2019-02-23 ENCOUNTER — Emergency Department (HOSPITAL_BASED_OUTPATIENT_CLINIC_OR_DEPARTMENT_OTHER): Payer: BLUE CROSS/BLUE SHIELD

## 2019-02-23 ENCOUNTER — Other Ambulatory Visit: Payer: Self-pay

## 2019-02-23 ENCOUNTER — Encounter (HOSPITAL_BASED_OUTPATIENT_CLINIC_OR_DEPARTMENT_OTHER): Payer: Self-pay

## 2019-02-23 DIAGNOSIS — N189 Chronic kidney disease, unspecified: Secondary | ICD-10-CM | POA: Diagnosis not present

## 2019-02-23 DIAGNOSIS — Z794 Long term (current) use of insulin: Secondary | ICD-10-CM | POA: Diagnosis not present

## 2019-02-23 DIAGNOSIS — J441 Chronic obstructive pulmonary disease with (acute) exacerbation: Secondary | ICD-10-CM | POA: Diagnosis not present

## 2019-02-23 DIAGNOSIS — Z79899 Other long term (current) drug therapy: Secondary | ICD-10-CM | POA: Diagnosis not present

## 2019-02-23 DIAGNOSIS — Z20828 Contact with and (suspected) exposure to other viral communicable diseases: Secondary | ICD-10-CM | POA: Insufficient documentation

## 2019-02-23 DIAGNOSIS — R0602 Shortness of breath: Secondary | ICD-10-CM | POA: Insufficient documentation

## 2019-02-23 DIAGNOSIS — E1140 Type 2 diabetes mellitus with diabetic neuropathy, unspecified: Secondary | ICD-10-CM | POA: Diagnosis not present

## 2019-02-23 DIAGNOSIS — R079 Chest pain, unspecified: Secondary | ICD-10-CM

## 2019-02-23 DIAGNOSIS — J189 Pneumonia, unspecified organism: Secondary | ICD-10-CM | POA: Diagnosis not present

## 2019-02-23 DIAGNOSIS — E1122 Type 2 diabetes mellitus with diabetic chronic kidney disease: Secondary | ICD-10-CM | POA: Insufficient documentation

## 2019-02-23 DIAGNOSIS — R05 Cough: Secondary | ICD-10-CM | POA: Diagnosis not present

## 2019-02-23 DIAGNOSIS — I129 Hypertensive chronic kidney disease with stage 1 through stage 4 chronic kidney disease, or unspecified chronic kidney disease: Secondary | ICD-10-CM | POA: Diagnosis not present

## 2019-02-23 DIAGNOSIS — Z87891 Personal history of nicotine dependence: Secondary | ICD-10-CM | POA: Diagnosis not present

## 2019-02-23 DIAGNOSIS — R350 Frequency of micturition: Secondary | ICD-10-CM | POA: Diagnosis not present

## 2019-02-23 DIAGNOSIS — R058 Other specified cough: Secondary | ICD-10-CM

## 2019-02-23 LAB — BASIC METABOLIC PANEL
Anion gap: 13 (ref 5–15)
BUN: 28 mg/dL — ABNORMAL HIGH (ref 8–23)
CO2: 21 mmol/L — ABNORMAL LOW (ref 22–32)
Calcium: 9 mg/dL (ref 8.9–10.3)
Chloride: 104 mmol/L (ref 98–111)
Creatinine, Ser: 1.3 mg/dL — ABNORMAL HIGH (ref 0.61–1.24)
GFR calc Af Amer: >60 mL/min (ref >60)
GFR calc non Af Amer: 59 mL/min — ABNORMAL LOW (ref >60)
Glucose, Bld: 447 mg/dL — ABNORMAL HIGH (ref 70–99)
Potassium: 4.4 mmol/L (ref 3.5–5.1)
Sodium: 138 mmol/L (ref 135–145)

## 2019-02-23 LAB — URINALYSIS, ROUTINE W REFLEX MICROSCOPIC
Bilirubin Urine: NEGATIVE
Glucose, UA: >=500 mg/dL — AB
Ketones, ur: NEGATIVE mg/dL
Leukocytes,Ua: NEGATIVE
Nitrite: NEGATIVE
Protein, ur: 100 mg/dL — AB
Specific Gravity, Urine: 1.015 (ref 1.005–1.030)
pH: 6 (ref 5.0–8.0)

## 2019-02-23 LAB — SARS CORONAVIRUS 2 AG (30 MIN TAT): SARS Coronavirus 2 Ag: NEGATIVE

## 2019-02-23 LAB — CBC
HCT: 38 % — ABNORMAL LOW (ref 39.0–52.0)
Hemoglobin: 11.8 g/dL — ABNORMAL LOW (ref 13.0–17.0)
MCH: 28.7 pg (ref 26.0–34.0)
MCHC: 31.1 g/dL (ref 30.0–36.0)
MCV: 92.5 fL (ref 80.0–100.0)
Platelets: 179 10*3/uL (ref 150–400)
RBC: 4.11 MIL/uL — ABNORMAL LOW (ref 4.22–5.81)
RDW: 15.1 % (ref 11.5–15.5)
WBC: 13.8 10*3/uL — ABNORMAL HIGH (ref 4.0–10.5)
nRBC: 0 % (ref 0.0–0.2)

## 2019-02-23 LAB — URINALYSIS, MICROSCOPIC (REFLEX)

## 2019-02-23 LAB — HEPATIC FUNCTION PANEL
ALT: 65 U/L — ABNORMAL HIGH (ref 0–44)
AST: 30 U/L (ref 15–41)
Albumin: 3.3 g/dL — ABNORMAL LOW (ref 3.5–5.0)
Alkaline Phosphatase: 79 U/L (ref 38–126)
Bilirubin, Direct: 0.2 mg/dL (ref 0.0–0.2)
Indirect Bilirubin: 0.3 mg/dL (ref 0.3–0.9)
Total Bilirubin: 0.5 mg/dL (ref 0.3–1.2)
Total Protein: 6.2 g/dL — ABNORMAL LOW (ref 6.5–8.1)

## 2019-02-23 LAB — LIPASE, BLOOD: Lipase: 31 U/L (ref 11–51)

## 2019-02-23 LAB — TROPONIN I (HIGH SENSITIVITY)
Troponin I (High Sensitivity): 6 ng/L (ref <18)
Troponin I (High Sensitivity): 7 ng/L (ref <18)

## 2019-02-23 LAB — BRAIN NATRIURETIC PEPTIDE: B Natriuretic Peptide: 55.6 pg/mL (ref 0.0–100.0)

## 2019-02-23 MED ORDER — IOHEXOL 350 MG/ML SOLN
100.0000 mL | Freq: Once | INTRAVENOUS | Status: AC | PRN
  Administered 2019-02-23: 100 mL via INTRAVENOUS

## 2019-02-23 MED ORDER — DOXYCYCLINE HYCLATE 100 MG PO CAPS: 100.0000 mg | ORAL_CAPSULE | Freq: Two times a day (BID) | ORAL | 0 refills | Status: AC

## 2019-02-23 MED ORDER — DOXYCYCLINE HYCLATE 100 MG PO TABS
100.0000 mg | ORAL_TABLET | Freq: Once | ORAL | Status: AC
  Administered 2019-02-23: 100 mg via ORAL
  Filled 2019-02-23: qty 1

## 2019-02-23 MED ORDER — METHYLPREDNISOLONE SODIUM SUCC 125 MG IJ SOLR
125.0000 mg | Freq: Once | INTRAMUSCULAR | Status: AC
  Administered 2019-02-23: 125 mg via INTRAVENOUS
  Filled 2019-02-23: qty 2

## 2019-02-23 MED ORDER — IPRATROPIUM BROMIDE HFA 17 MCG/ACT IN AERS
2.0000 | INHALATION_SPRAY | Freq: Once | RESPIRATORY_TRACT | Status: AC
  Administered 2019-02-23: 2 via RESPIRATORY_TRACT
  Filled 2019-02-23: qty 12.9

## 2019-02-23 MED ORDER — ALBUTEROL SULFATE HFA 108 (90 BASE) MCG/ACT IN AERS
2.0000 | INHALATION_SPRAY | Freq: Once | RESPIRATORY_TRACT | Status: AC
  Administered 2019-02-23: 2 via RESPIRATORY_TRACT
  Filled 2019-02-23: qty 6.7

## 2019-02-23 NOTE — Progress Notes (Signed)
Patient ambulated around his room several times while on pulse ox.  Upon completion patient's SPO2 was 96% and his HR was 116.  Patient states that he feels much better.

## 2019-02-23 NOTE — Discharge Instructions (Signed)
Your work-up today was consistent with pneumonia and COPD exacerbation.  We discussed admission but you would rather try going home to see if you can treat as an outpatient.  He reported you are already on steroids and did not want further steroids so please take the antibiotics twice a day for the next week to help treat the pneumonia.  Please be careful and watch her breathing.  If any symptoms change or worsen, please return to the nearest emergency department.  Please follow-up with your primary doctor as well.

## 2019-02-23 NOTE — ED Triage Notes (Signed)
Pt states SOB and chest pain since last Thursday night that is progressively worsening. 98% on RA. Chest pain central that does not radiate, hurts when coughing. Dry cough. No fever.

## 2019-02-23 NOTE — ED Provider Notes (Signed)
[facility] EMERGENCY DEPARTMENT Provider Note   CSN: [id number] Arrival date & time: 02/23/19  1541     History   Chief Complaint Chief Complaint  Patient presents with   Shortness of Breath    HPI Travis [name] is a 63 y.o. male.     The history is provided by the patient and medical records. No language interpreter was used.  Shortness of Breath Severity:  Severe Onset quality:  Gradual Duration:  6 days Timing:  Constant Progression:  Worsening Chronicity:  New Context: URI (cough)   Relieved by:  Nothing Worsened by:  Coughing and deep breathing (laying flat) Ineffective treatments:  None tried Associated symptoms: chest pain, cough, sputum production, vomiting and wheezing   Associated symptoms: no abdominal pain, no diaphoresis, no fever, no headaches and no neck pain   Risk factors: hx of cancer   Risk factors: no hx of PE/DVT     Past Medical History:  Diagnosis Date   Arthritis    Cancer ([place])    lung   Chronic kidney disease    Diabetes mellitus    TYPE II   Dyspnea    at times   Hypertension    Pneumonia    "years ago"    Patient Active Problem List   Diagnosis Date Noted   Exertional dyspnea 09/04/2018   Leukopenia due to antineoplastic chemotherapy ([place]) 06/05/2018   Diabetes ([place]) 05/15/2018   Diabetic neuropathy ([place]) 05/08/2018   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 [facility]) 04/30/2018   COPD (chronic obstructive pulmonary disease) ([place]) 04/24/2018   Right lower lobe lung mass    Anemia due to antineoplastic chemotherapy 04/06/2018   Lung cancer ([place]) 04/03/2018    Past Surgical History:  Procedure Laterality Date   COLONOSCOPY W/ POLYPECTOMY     IR IMAGING GUIDED PORT INSERTION  05/05/2018   NASAL FRACTURE SURGERY     VIDEO BRONCHOSCOPY WITH ENDOBRONCHIAL ULTRASOUND Right 04/20/2018   Procedure: VIDEO BRONCHOSCOPY WITH ENDOBRONCHIAL ULTRASOUND;   Surgeon: Juanito Doom, MD;  Location: [facility];  Service: Thoracic;  Laterality: Right;        Home Medications    Prior to Admission medications   Medication Sig Start Date End Date Taking? Authorizing Provider  albuterol (VENTOLIN HFA) 108 (90 Base) MCG/ACT inhaler Inhale 2 puffs into the lungs every 6 (six) hours as needed for wheezing or shortness of breath. 07/09/18   Tish Men, MD  amitriptyline (ELAVIL) 25 MG tablet Take 50 mg by mouth at bedtime. 04/03/18   [provider]  amLODipine (NORVASC) 5 MG tablet Take 5 mg by mouth daily. 04/10/18   [provider]  budesonide-formoterol (SYMBICORT) 160-4.5 MCG/ACT inhaler Inhale 2 puffs into the lungs 2 (two) times daily. 07/09/18   Tish Men, MD  diclofenac (VOLTAREN) 75 MG EC tablet Take 75 mg by mouth 2 (two) times daily. 04/03/18   [provider]  furosemide (LASIX) 40 MG tablet Take 1 tablet (40 mg total) by mouth daily for 30 days. 08/07/18 09/06/18  Tish Men, MD  gabapentin (NEURONTIN) 300 MG capsule Take 2 capsules by mouth twice daily 12/25/18   Tish Men, MD  glimepiride (AMARYL) 2 MG tablet TAKE 1 TABLET BY MOUTH ONCE DAILY BEFORE BREAKFAST 08/01/18   [provider]  glucose blood test strip 4x a day 07/01/18   Shamleffer, Melanie Crazier, MD  insulin aspart (NOVOLOG FLEXPEN) 100 UNIT/ML FlexPen Inject 6 Units into the skin  3 (three) times daily with meals. 07/01/18   Shamleffer, Melanie Crazier, MD  Insulin Glargine (LANTUS) 100 UNIT/ML Solostar Pen Inject 19 Units into the skin at bedtime for 30 days. 07/01/18 07/31/18  Shamleffer, Melanie Crazier, MD  Insulin Pen Needle (BD PEN NEEDLE NANO U/F) 32G X 4 MM MISC 4x daily 07/01/18   Shamleffer, Melanie Crazier, MD  lidocaine (XYLOCAINE) 2 % solution Patient: Mix 1part 2% viscous lidocaine, 1part H20. Swish & swallow 66mL of diluted mixture, 32min before meals and at bedtime, up to QID 05/29/18   Tish Men, MD  lidocaine-prilocaine (EMLA) cream Apply a dime size  of cream to port one- two hours prior to access. Cover with Saran wrap. 08/07/18   Tish Men, MD  metFORMIN (GLUCOPHAGE-XR) 500 MG 24 hr tablet Take 2 tablets (1,000 mg total) by mouth 2 (two) times daily. 06/19/18 09/17/18  Tish Men, MD  mometasone (ELOCON) 0.1 % cream Apply thin layer to affected areas bid prn 05/05/18   [provider]  sucralfate (CARAFATE) 1 g tablet Take 1 tablet (1 g total) by mouth 4 (four) times daily. 06/29/18   Kyung Rudd, MD  traMADol (ULTRAM) 50 MG tablet Take 1 tablet (50 mg total) by mouth every 8 (eight) hours as needed for moderate pain. 09/04/18   Tish Men, MD  prochlorperazine (COMPAZINE) 10 MG tablet Take 1 tablet (10 mg total) by mouth every 6 (six) hours as needed (Nausea or vomiting). 04/24/18 08/04/18  Tish Men, MD    Family History Family History  Problem Relation Age of Onset   Leukemia Sister    Cancer Brother    Diabetes Mother     Social History Social History   Tobacco Use   Smoking status: Former Smoker    Packs/day: 1.00    Years: 45.00    Pack years: 45.00    Types: Cigarettes    Quit date: 03/31/2018    Years since quitting: 0.9   Smokeless tobacco: Former Systems developer    Types: Chew    Quit date: 03/25/1973  Substance Use Topics   Alcohol use: Yes    Alcohol/week: 2.0 standard drinks    Types: 2 Shots of liquor per week   Drug use: No     Allergies   Patient has no known allergies.   Review of Systems Review of Systems  Constitutional: Positive for fatigue. Negative for chills, diaphoresis and fever.  HENT: Negative for congestion.   Eyes: Negative for visual disturbance.  Respiratory: Positive for cough, sputum production, chest tightness, shortness of breath and wheezing. Negative for choking and stridor.   Cardiovascular: Positive for chest pain and leg swelling. Negative for palpitations.  Gastrointestinal: Positive for nausea and vomiting. Negative for abdominal pain, constipation and diarrhea.  Genitourinary:  Positive for frequency. Negative for decreased urine volume and flank pain.  Musculoskeletal: Negative for back pain, neck pain and neck stiffness.  Skin: Negative for wound.  Neurological: Negative for light-headedness, numbness and headaches.  Psychiatric/Behavioral: Negative for agitation and confusion.  All other systems reviewed and are negative.    Physical Exam Updated Vital Signs BP (!) 168/94 (BP Location: Right Arm)    Pulse (!) 118    Temp 98 F (36.7 C) (Oral)    Resp (!) 24    Ht 5\' 10"  (1.778 m)    Wt 87.1 kg    SpO2 100%    BMI 27.55 kg/m   Physical Exam Vitals signs and nursing note reviewed.  Constitutional:  General: He is not in acute distress.    Appearance: He is well-developed. He is not ill-appearing, toxic-appearing or diaphoretic.  HENT:     Head: Normocephalic and atraumatic.  Eyes:     Conjunctiva/sclera: Conjunctivae normal.     Pupils: Pupils are equal, round, and reactive to light.  Neck:     Musculoskeletal: Normal range of motion and neck supple.  Cardiovascular:     Rate and Rhythm: Regular rhythm. Tachycardia present.     Heart sounds: No murmur.  Pulmonary:     Effort: Pulmonary effort is normal. Tachypnea present. No respiratory distress.     Breath sounds: Wheezing, rhonchi and rales present.  Chest:     Chest wall: No tenderness.  Abdominal:     Palpations: Abdomen is soft.     Tenderness: There is no abdominal tenderness.  Musculoskeletal:     Right lower leg: He exhibits tenderness. Edema present.     Left lower leg: He exhibits tenderness. Edema present.  Skin:    General: Skin is warm and dry.     Capillary Refill: Capillary refill takes less than 2 seconds.  Neurological:     General: No focal deficit present.     Mental Status: He is alert.  Psychiatric:        Mood and Affect: Mood normal.      ED Treatments / Results  Labs (all labs ordered are listed, but only abnormal results are displayed) Labs Reviewed  CBC  - Abnormal; Notable for the following components:      Result Value   WBC 13.8 (*)    RBC 4.11 (*)    Hemoglobin 11.8 (*)    HCT 38.0 (*)    All other components within normal limits  HEPATIC FUNCTION PANEL - Abnormal; Notable for the following components:   Total Protein 6.2 (*)    Albumin 3.3 (*)    ALT 65 (*)    All other components within normal limits  URINALYSIS, ROUTINE W REFLEX MICROSCOPIC - Abnormal; Notable for the following components:   Glucose, UA >=500 (*)    Hgb urine dipstick SMALL (*)    Protein, ur 100 (*)    All other components within normal limits  BASIC METABOLIC PANEL - Abnormal; Notable for the following components:   CO2 21 (*)    Glucose, Bld 447 (*)    BUN 28 (*)    Creatinine, Ser 1.30 (*)    GFR calc non Af Amer 59 (*)    All other components within normal limits  URINALYSIS, MICROSCOPIC (REFLEX) - Abnormal; Notable for the following components:   Bacteria, UA RARE (*)    All other components within normal limits  SARS CORONAVIRUS 2 AG (30 MIN TAT)  URINE CULTURE  NOVEL CORONAVIRUS, NAA (HOSP ORDER, SEND-OUT TO REF LAB; TAT 18-24 HRS)  LIPASE, BLOOD  BRAIN NATRIURETIC PEPTIDE  TROPONIN I (HIGH SENSITIVITY)  TROPONIN I (HIGH SENSITIVITY)    EKG None ED ECG REPORT   Date: 02/23/2019  Rate: 120  Rhythm: normal sinus rhythm  QRS Axis: normal  Intervals: normal  ST/T Wave abnormalities: nonspecific T wave changes  Conduction Disutrbances:none  Narrative Interpretation:   Old EKG Reviewed: when compared to prior, more wandering baseline today and slghtly faster rate. No STEMI  I have personally reviewed the EKG tracing and agree with the computerized printout as noted.    Radiology Ct Angio Chest Pe W And/or Wo Contrast  Result Date: 02/23/2019 CLINICAL DATA:  Prior lung cancer, suspect pulmonary embolism with shortness of breath and chest pain since last Thursday night. EXAM: CT ANGIOGRAPHY CHEST WITH CONTRAST TECHNIQUE: Multidetector  CT imaging of the chest was performed using the standard protocol during bolus administration of intravenous contrast. Multiplanar CT image reconstructions and MIPs were obtained to evaluate the vascular anatomy. Study quality limited by respiratory motion with respect to segmental and smaller vessels. CONTRAST:  168mL OMNIPAQUE IOHEXOL 350 MG/ML SOLN COMPARISON:  Chest CT 10/12/2018 FINDINGS: Cardiovascular: Moderate calcific and noncalcific atherosclerotic change throughout the thoracic aorta. Heart size normal without pericardial effusion. Pulmonary arterial assessment is limited secondary to respiratory motion. No central pulmonary embolus or signs of lobar embolus. Peripheral vascular branches with limited assessment. Mediastinum/Nodes: No signs of adenopathy in the chest. Esophagus and thoracic inlet structures are normal. Lungs/Pleura: More extensive citric is a shin seen in the right upper chest with pleural and parenchymal scarring. Signs of ground-glass opacity and septal thickening in the right mid chest about the ill-defined area arising from posterior right hilum with decreased size of pulmonary mass since the prior exam now measuring approximately 2.2 x 1.5 cm previously approximately 2.7 x 1.5 cm. Ground-glass and septal thickening with some areas of more pronounced bandlike thickening extending to the pleural surface with the dominant mass distorting the fissure as on the previous study and with areas of nodular septal thickening and some ground-glass extending further inferiorly into the posterior right middle lobe from the superior segment. No signs of dense consolidation. No signs of pleural effusion. Airways are patent. Stable tiny nodule in the anterior left chest approximately 4 mm. Upper Abdomen: No sign of acute upper abdominal process. The visualized adrenal glands which are incompletely imaged are unremarkable. Musculoskeletal: Signs of spinal degenerative change. No evidence of acute bone  finding or of destructive bone process. Review of the MIP images confirms the above findings. IMPRESSION: 1. No central pulmonary embolus or signs of lobar embolus. Peripheral vascular branches with limited assessment due to respiratory motion. 2. Interval decrease in the size of the mass-like area along the major fissure in the right chest but with increasing septal thickening, ground-glass involving areas of previously radiated lung. Findings may simply represent evolving radiation pneumonitis though changes could be related to pneumonitis or radiation recall in the setting of immunotherapy or superimposed pneumonitis from infection. Clinical correlation is suggested 3. Follow-up will be critical to exclude the possibility of lymphangitic carcinomatosis in this area as well. 4. Previously described pulmonary nodules surrounding the dominant lesion are obscured by this process. 5. Aortic atherosclerosis. Aortic Atherosclerosis (ICD10-I70.0). Electronically Signed   By: Zetta Bills M.D.   On: 02/23/2019 18:04   US Venous Img Lower Bilateral  Result Date: 02/23/2019 CLINICAL DATA:  Bilateral lower extremity pain and swelling, new this week. EXAM: BILATERAL LOWER EXTREMITY VENOUS DOPPLER ULTRASOUND TECHNIQUE: Gray-scale sonography with graded compression, as well as color Doppler and duplex ultrasound were performed to evaluate the lower extremity deep venous systems from the level of the common femoral vein and including the common femoral, femoral, profunda femoral, popliteal and calf veins including the posterior tibial, peroneal and gastrocnemius veins when visible. The superficial great saphenous vein was also interrogated. Spectral Doppler was utilized to evaluate flow at rest and with distal augmentation maneuvers in the common femoral, femoral and popliteal veins. COMPARISON:  None. FINDINGS: RIGHT LOWER EXTREMITY Common Femoral Vein: No evidence of thrombus. Normal compressibility, respiratory  phasicity and response to augmentation. Saphenofemoral Junction: No evidence of thrombus. Normal  compressibility and flow on color Doppler imaging. Profunda Femoral Vein: No evidence of thrombus. Normal compressibility and flow on color Doppler imaging. Femoral Vein: No evidence of thrombus. Normal compressibility, respiratory phasicity and response to augmentation. Popliteal Vein: No evidence of thrombus. Normal compressibility, respiratory phasicity and response to augmentation. Calf Veins: No evidence of thrombus. Normal compressibility and flow on color Doppler imaging. Superficial Great Saphenous Vein: No evidence of thrombus. Normal compressibility. Venous Reflux:  None. Other Findings:  Subcutaneous edema in the calf. LEFT LOWER EXTREMITY Common Femoral Vein: No evidence of thrombus. Normal compressibility, respiratory phasicity and response to augmentation. Saphenofemoral Junction: No evidence of thrombus. Normal compressibility and flow on color Doppler imaging. Profunda Femoral Vein: No evidence of thrombus. Normal compressibility and flow on color Doppler imaging. Femoral Vein: No evidence of thrombus. Normal compressibility, respiratory phasicity and response to augmentation. Popliteal Vein: No evidence of thrombus. Normal compressibility, respiratory phasicity and response to augmentation. Calf Veins: No evidence of thrombus. Normal compressibility and flow on color Doppler imaging. Superficial Great Saphenous Vein: No evidence of thrombus. Normal compressibility. Venous Reflux:  None. Other Findings: Superficial thrombus in the superficial saphenous vein. Subcutaneous edema in the calf. IMPRESSION: 1. No evidence of deep venous thrombosis in either lower extremity. 2. Superficial thrombus in the left superficial saphenous vein. 3. Bilateral lower extremity edema. Electronically Signed   By: Keith Rake M.D.   On: 02/23/2019 18:00   Dg Chest Port 1 View  Result Date: 02/23/2019 CLINICAL DATA:   Chest pain, right lung cancer EXAM: PORTABLE CHEST 1 VIEW COMPARISON:  08/07/2018 chest radiograph. FINDINGS: Left internal jugular Port-A-Cath terminates at cavoatrial junction. Stable cardiomediastinal silhouette with normal heart size. No pneumothorax. No pleural effusion. Mild reticular and hazy right parahilar opacity is similar. No acute consolidative airspace disease. IMPRESSION: No acute cardiopulmonary disease. Hazy and reticular right parahilar opacity, similar, compatible with known tumor/post treatment change. Electronically Signed   By: Ilona Sorrel M.D.   On: 02/23/2019 16:37    Procedures Procedures (including critical care time)  Medications Ordered in ED Medications  doxycycline (VIBRA-TABS) tablet 100 mg (has no administration in time range)  methylPREDNISolone sodium succinate (SOLU-MEDROL) 125 mg/2 mL injection 125 mg (125 mg Intravenous Given 02/23/19 1725)  albuterol (VENTOLIN HFA) 108 (90 Base) MCG/ACT inhaler 2 puff (2 puffs Inhalation Given 02/23/19 1728)  ipratropium (ATROVENT HFA) inhaler 2 puff (2 puffs Inhalation Given 02/23/19 1728)  iohexol (OMNIPAQUE) 350 MG/ML injection 100 mL (100 mLs Intravenous Contrast Given 02/23/19 1738)     Initial Impression / Assessment and Plan / ED Course  I have reviewed the triage vital signs and the nursing notes.  Pertinent labs & imaging results that were available during my care of the patient were reviewed by me and considered in my medical decision making (see chart for details).        Vishruth Seoane is a 63 y.o. male with a past medical history significant for hypertension, diabetes, CKD, prior lung cancer, and COPD who presents with 6 days of chest pain, shortness of breath, orthopnea, bilateral lower extremity pain/swelling, malaise, and fatigue.  Patient reports that his symptoms been ongoing for the last 6 days have been worsening.  He describes extremely pleuritic 10 out of 10 chest pain associated with productive  yellow/green cough.  He reports the pain is worsened with deep breathing and coughing.  He denies trauma.  He reports he is more short of breath when he lays flat and has never had this before.  He reports for the last week he has had bilateral lower extremity pain and swelling in both legs distal to the knee.  He has taken a fluid pill before but it has never been like this.  He reports some nausea and vomiting but denies significant abdominal pain.  Denies back or flank pain.  He reports his chest pain is a pressure and tightness pain is central chest that does not radiate.  He reports associated shortness of breath.  He denies fevers or chills and denies any known Covid contacts.  On arrival, patient is found to be tachycardic and tachypneic but he is afebrile.  He is hypertensive and not hypotensive.  On exam, lungs have wheezing and coarseness bilaterally.  Will give Solu-Medrol and albuterol/Atrovent.  Chest and abdomen are nontender.  I cannot reduce the patient's discomfort initially.  Legs has some pitting edema in both lower extremities but he has good pulses sensation and strength.  Good upper extremity pulses and symmetric.  Patient otherwise resting comfortably on room air.  Clinically most concerned about pneumonia versus PE versus COPD exacerbation versus COVID-19 infection.  We will get ultrasounds of his legs as well as a BNP.  I feel he is too high risk for a D-dimer at this time due to the tachycardia, tachypnea, cancer diagnosis, and pleuritic chest pain with shortness of breath.  He will be ambulated with pulse oximetry to see if he gets hypoxic.  We will give the breathing treatments first.  Anticipate reassessment for work-up to determine disposition.  BNP not elevated.  Troponin negative x2.  Ultrasounds negative for DVT in the legs.  CT scan showed no evidence of pulmonary embolus.  There was decrease in size of the masslike area in the chest.  There was some groundglass opacity  concerning for radiation pneumonitis or possible infection.  Given the patient's productive cough and leukocytosis, he will be treated for pneumonia.  He reports feeling much better after steroids and breathing treatment and wants to try going home.  He does not want admission at this time.  Patient was ambulated and did not have hypoxia.  Patient was feeling better and understands return precautions.  He will follow-up with his PCP as well as return if symptoms worsen.  He had no other questions or concerns and was discharged in good condition with normal oxygen saturations on room air.   Final Clinical Impressions(s) / ED Diagnoses   Final diagnoses:  SOB (shortness of breath)  Productive cough  COPD exacerbation ([place])  Community acquired pneumonia, unspecified laterality    ED Discharge Orders         Ordered    doxycycline (VIBRAMYCIN) 100 MG capsule  2 times daily     02/23/19 2035          Clinical Impression: 1. SOB (shortness of breath)   2. Chest pain   3. Productive cough   4. COPD exacerbation ([place])   5. Community acquired pneumonia, unspecified laterality     Disposition: Discharge  Condition: Good  I have discussed the results, Dx and Tx plan with the pt(& family if present). He/she/they expressed understanding and agree(s) with the plan. Discharge instructions discussed at great length. Strict return precautions discussed and pt &/or family have verbalized understanding of the instructions. No further questions at time of discharge.    New Prescriptions   DOXYCYCLINE (VIBRAMYCIN) 100 MG CAPSULE    Take 1 capsule (100 mg total) by mouth 2 (two) times daily for 7  days.    Follow Up: Brock Ra, PA-C [place] [address] [phone]     [facility] [address] [id number] SP JSUN [place] [phone]       Aveen Stansel, Gwenyth Allegra, MD 02/23/19 2038

## 2019-02-23 NOTE — ED Notes (Signed)
Pt states he is in remission for lung cancer.

## 2019-02-23 NOTE — ED Notes (Signed)
Patient verbalizes understanding of discharge instructions. Opportunity for questioning and answers were provided. Armband removed by staff, pt discharged from ED.  

## 2019-02-25 LAB — URINE CULTURE: Culture: NO GROWTH

## 2019-02-26 LAB — NOVEL CORONAVIRUS, NAA (HOSP ORDER, SEND-OUT TO REF LAB; TAT 18-24 HRS): SARS-CoV-2, NAA: NOT DETECTED

## 2019-03-07 ENCOUNTER — Other Ambulatory Visit: Payer: Self-pay | Admitting: Hematology

## 2019-03-07 DIAGNOSIS — C3431 Malignant neoplasm of lower lobe, right bronchus or lung: Secondary | ICD-10-CM

## 2019-03-20 ENCOUNTER — Other Ambulatory Visit: Payer: Self-pay

## 2019-03-20 ENCOUNTER — Encounter (HOSPITAL_BASED_OUTPATIENT_CLINIC_OR_DEPARTMENT_OTHER): Payer: Self-pay | Admitting: Emergency Medicine

## 2019-03-20 ENCOUNTER — Observation Stay (HOSPITAL_BASED_OUTPATIENT_CLINIC_OR_DEPARTMENT_OTHER)
Admission: EM | Admit: 2019-03-20 | Discharge: 2019-03-22 | Disposition: A | Discharge status: Home / Self Care | Payer: BLUE CROSS/BLUE SHIELD | Attending: Internal Medicine | Admitting: Internal Medicine

## 2019-03-20 DIAGNOSIS — R358 Other polyuria: Secondary | ICD-10-CM | POA: Diagnosis not present

## 2019-03-20 DIAGNOSIS — R42 Dizziness and giddiness: Secondary | ICD-10-CM | POA: Diagnosis not present

## 2019-03-20 DIAGNOSIS — R531 Weakness: Secondary | ICD-10-CM | POA: Insufficient documentation

## 2019-03-20 DIAGNOSIS — I130 Hypertensive heart and chronic kidney disease with heart failure and stage 1 through stage 4 chronic kidney disease, or unspecified chronic kidney disease: Secondary | ICD-10-CM | POA: Insufficient documentation

## 2019-03-20 DIAGNOSIS — C3431 Malignant neoplasm of lower lobe, right bronchus or lung: Secondary | ICD-10-CM | POA: Insufficient documentation

## 2019-03-20 DIAGNOSIS — Z9221 Personal history of antineoplastic chemotherapy: Secondary | ICD-10-CM | POA: Diagnosis not present

## 2019-03-20 DIAGNOSIS — I5032 Chronic diastolic (congestive) heart failure: Secondary | ICD-10-CM | POA: Diagnosis not present

## 2019-03-20 DIAGNOSIS — R739 Hyperglycemia, unspecified: Secondary | ICD-10-CM | POA: Diagnosis present

## 2019-03-20 DIAGNOSIS — Z79899 Other long term (current) drug therapy: Secondary | ICD-10-CM | POA: Diagnosis not present

## 2019-03-20 DIAGNOSIS — E1165 Type 2 diabetes mellitus with hyperglycemia: Secondary | ICD-10-CM | POA: Diagnosis not present

## 2019-03-20 DIAGNOSIS — E1122 Type 2 diabetes mellitus with diabetic chronic kidney disease: Secondary | ICD-10-CM | POA: Insufficient documentation

## 2019-03-20 DIAGNOSIS — Z923 Personal history of irradiation: Secondary | ICD-10-CM | POA: Insufficient documentation

## 2019-03-20 DIAGNOSIS — Z7951 Long term (current) use of inhaled steroids: Secondary | ICD-10-CM | POA: Diagnosis not present

## 2019-03-20 DIAGNOSIS — N179 Acute kidney failure, unspecified: Secondary | ICD-10-CM | POA: Diagnosis not present

## 2019-03-20 DIAGNOSIS — Z791 Long term (current) use of non-steroidal anti-inflammatories (NSAID): Secondary | ICD-10-CM | POA: Diagnosis not present

## 2019-03-20 DIAGNOSIS — E1142 Type 2 diabetes mellitus with diabetic polyneuropathy: Secondary | ICD-10-CM

## 2019-03-20 DIAGNOSIS — E1140 Type 2 diabetes mellitus with diabetic neuropathy, unspecified: Secondary | ICD-10-CM | POA: Diagnosis present

## 2019-03-20 DIAGNOSIS — M170 Bilateral primary osteoarthritis of knee: Secondary | ICD-10-CM | POA: Insufficient documentation

## 2019-03-20 DIAGNOSIS — N189 Chronic kidney disease, unspecified: Secondary | ICD-10-CM | POA: Diagnosis not present

## 2019-03-20 DIAGNOSIS — E860 Dehydration: Secondary | ICD-10-CM | POA: Diagnosis not present

## 2019-03-20 DIAGNOSIS — Z87891 Personal history of nicotine dependence: Secondary | ICD-10-CM | POA: Insufficient documentation

## 2019-03-20 DIAGNOSIS — Z20828 Contact with and (suspected) exposure to other viral communicable diseases: Secondary | ICD-10-CM | POA: Insufficient documentation

## 2019-03-20 DIAGNOSIS — C3490 Malignant neoplasm of unspecified part of unspecified bronchus or lung: Secondary | ICD-10-CM | POA: Diagnosis present

## 2019-03-20 DIAGNOSIS — Z794 Long term (current) use of insulin: Secondary | ICD-10-CM | POA: Insufficient documentation

## 2019-03-20 DIAGNOSIS — J449 Chronic obstructive pulmonary disease, unspecified: Secondary | ICD-10-CM | POA: Diagnosis present

## 2019-03-20 DIAGNOSIS — C7801 Secondary malignant neoplasm of right lung: Secondary | ICD-10-CM | POA: Diagnosis present

## 2019-03-20 LAB — CBC
HCT: 40 % (ref 39.0–52.0)
Hemoglobin: 11.9 g/dL — ABNORMAL LOW (ref 13.0–17.0)
MCH: 29.3 pg (ref 26.0–34.0)
MCHC: 29.8 g/dL — ABNORMAL LOW (ref 30.0–36.0)
MCV: 98.5 fL (ref 80.0–100.0)
Platelets: 156 10*3/uL (ref 150–400)
RBC: 4.06 MIL/uL — ABNORMAL LOW (ref 4.22–5.81)
RDW: 14.5 % (ref 11.5–15.5)
WBC: 9 10*3/uL (ref 4.0–10.5)
nRBC: 0.3 % — ABNORMAL HIGH (ref 0.0–0.2)

## 2019-03-20 LAB — BASIC METABOLIC PANEL
Anion gap: 14 (ref 5–15)
BUN: 50 mg/dL — ABNORMAL HIGH (ref 8–23)
CO2: 22 mmol/L (ref 22–32)
Calcium: 8.2 mg/dL — ABNORMAL LOW (ref 8.9–10.3)
Chloride: 94 mmol/L — ABNORMAL LOW (ref 98–111)
Creatinine, Ser: 2.15 mg/dL — ABNORMAL HIGH (ref 0.61–1.24)
GFR calc Af Amer: 37 mL/min — ABNORMAL LOW (ref >60)
GFR calc non Af Amer: 32 mL/min — ABNORMAL LOW (ref >60)
Glucose, Bld: 493 mg/dL — ABNORMAL HIGH (ref 70–99)
Potassium: 4.6 mmol/L (ref 3.5–5.1)
Sodium: 130 mmol/L — ABNORMAL LOW (ref 135–145)

## 2019-03-20 LAB — URINALYSIS, MICROSCOPIC (REFLEX)

## 2019-03-20 LAB — URINALYSIS, ROUTINE W REFLEX MICROSCOPIC
Bilirubin Urine: NEGATIVE
Glucose, UA: >=500 mg/dL — AB
Hgb urine dipstick: NEGATIVE
Ketones, ur: NEGATIVE mg/dL
Leukocytes,Ua: NEGATIVE
Nitrite: NEGATIVE
Protein, ur: NEGATIVE mg/dL
Specific Gravity, Urine: 1.01 (ref 1.005–1.030)
pH: 6 (ref 5.0–8.0)

## 2019-03-20 LAB — CBG MONITORING, ED
Glucose-Capillary: 300 mg/dL — ABNORMAL HIGH (ref 70–99)
Glucose-Capillary: 437 mg/dL — ABNORMAL HIGH (ref 70–99)
Glucose-Capillary: 462 mg/dL — ABNORMAL HIGH (ref 70–99)

## 2019-03-20 MED ORDER — SODIUM CHLORIDE 0.9 % IV BOLUS
2000.0000 mL | Freq: Once | INTRAVENOUS | Status: AC
  Administered 2019-03-20: 1000 mL via INTRAVENOUS

## 2019-03-20 MED ORDER — INSULIN REGULAR(HUMAN) IN NACL 100-0.9 UT/100ML-% IV SOLN
INTRAVENOUS | Status: AC
  Filled 2019-03-20: qty 100

## 2019-03-20 MED ORDER — DEXTROSE 50 % IV SOLN: 0.0000 mL | INTRAVENOUS | Status: DC | PRN

## 2019-03-20 MED ORDER — INSULIN REGULAR(HUMAN) IN NACL 100-0.9 UT/100ML-% IV SOLN
INTRAVENOUS | Status: DC
  Administered 2019-03-20: 16 [IU]/h via INTRAVENOUS

## 2019-03-20 MED ORDER — DEXTROSE-NACL 5-0.45 % IV SOLN: INTRAVENOUS | Status: DC

## 2019-03-20 MED ORDER — LIDOCAINE 4 % EX CREA
TOPICAL_CREAM | CUTANEOUS | Status: AC
  Filled 2019-03-20: qty 5

## 2019-03-20 MED ORDER — SODIUM CHLORIDE 0.9 % IV SOLN: INTRAVENOUS | Status: DC

## 2019-03-20 NOTE — ED Triage Notes (Signed)
Pt reports his CBG reading "HIGH" today, states has been in the 200s all day. States weak and dizzy.

## 2019-03-20 NOTE — ED Provider Notes (Signed)
[facility] Provider Note   CSN: [id number] Arrival date & time: 03/20/19  2049     History Chief Complaint  Patient presents with  . Hyperglycemia    Travis [name] is a 63 y.o. male.  63 year old male with extensive past medical history including IDDM, lung cancer, CKD, hypertension, emphysema, bronchiectasis who presents with weakness and hyperglycemia.  Patient notes that he has recently been on a steroid taper and antibiotics for "pneumonia."  He was told to watch out for hypoglycemia and to go to the ED if he had high blood glucose readings.  He notes that today his blood glucose has been reading high and he has been feeling dizzy and weak.  He reports polyuria and polydipsia.  He denies any vomiting, diarrhea, fevers, or urinary symptoms.  He has been taking medications as prescribed.  The history is provided by the patient.  Hyperglycemia      Past Medical History:  Diagnosis Date  . Arthritis   . Cancer ([place])    lung  . Chronic kidney disease   . Diabetes mellitus    TYPE II  . Dyspnea    at times  . Hypertension   . Pneumonia    "years ago"    Patient Active Problem List   Diagnosis Date Noted  . Exertional dyspnea 09/04/2018  . Leukopenia due to antineoplastic chemotherapy ([place]) 06/05/2018  . Diabetes ([place]) 05/15/2018  . Diabetic neuropathy ([place]) 05/08/2018  . 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 COPD (chronic obstructive pulmonary disease) ([place]) 04/24/2018  . Right lower lobe lung mass   . Anemia due to antineoplastic chemotherapy 04/06/2018  . Lung cancer ([place]) 04/03/2018    Past Surgical History:  Procedure Laterality Date  . COLONOSCOPY W/ POLYPECTOMY    . IR IMAGING GUIDED PORT INSERTION  05/05/2018  . NASAL FRACTURE SURGERY    . VIDEO BRONCHOSCOPY WITH ENDOBRONCHIAL ULTRASOUND Right 04/20/2018   Procedure: VIDEO BRONCHOSCOPY WITH  ENDOBRONCHIAL ULTRASOUND;  Surgeon: Juanito Doom, MD;  Location: [facility];  Service: Thoracic;  Laterality: Right;       Family History  Problem Relation Age of Onset  . Leukemia Sister   . Cancer Brother   . Diabetes Mother     Social History   Tobacco Use  . Smoking status: Former Smoker    Packs/day: 1.00    Years: 45.00    Pack years: 45.00    Types: Cigarettes    Quit date: 03/31/2018    Years since quitting: 0.9  . Smokeless tobacco: Former Systems developer    Types: Chew    Quit date: 03/25/1973  Substance Use Topics  . Alcohol use: Yes    Alcohol/week: 2.0 standard drinks    Types: 2 Shots of liquor per week  . Drug use: No    Home Medications Prior to Admission medications   Medication Sig Start Date End Date Taking? Authorizing Provider  albuterol (VENTOLIN HFA) 108 (90 Base) MCG/ACT inhaler Inhale 2 puffs into the lungs every 6 (six) hours as needed for wheezing or shortness of breath. 07/09/18   Tish Men, MD  amitriptyline (ELAVIL) 25 MG tablet Take 50 mg by mouth at bedtime. 04/03/18   [provider]  amLODipine (NORVASC) 5 MG tablet Take 5 mg by mouth daily. 04/10/18   [provider]  budesonide-formoterol (SYMBICORT) 160-4.5 MCG/ACT inhaler Inhale 2 puffs into the lungs 2 (two) times daily. 07/09/18  Tish Men, MD  diclofenac (VOLTAREN) 75 MG EC tablet Take 75 mg by mouth 2 (two) times daily. 04/03/18   [provider]  furosemide (LASIX) 40 MG tablet Take 1 tablet (40 mg total) by mouth daily for 30 days. 08/07/18 09/06/18  Tish Men, MD  gabapentin (NEURONTIN) 300 MG capsule Take 2 capsules by mouth twice daily 12/25/18   Tish Men, MD  glimepiride (AMARYL) 2 MG tablet TAKE 1 TABLET BY MOUTH ONCE DAILY BEFORE BREAKFAST 08/01/18   [provider]  glucose blood test strip 4x a day 07/01/18   Shamleffer, Melanie Crazier, MD  insulin aspart (NOVOLOG FLEXPEN) 100 UNIT/ML FlexPen Inject 6 Units into the skin 3 (three) times daily with meals.  07/01/18   Shamleffer, Melanie Crazier, MD  Insulin Glargine (LANTUS) 100 UNIT/ML Solostar Pen Inject 19 Units into the skin at bedtime for 30 days. 07/01/18 07/31/18  Shamleffer, Melanie Crazier, MD  Insulin Pen Needle (BD PEN NEEDLE NANO U/F) 32G X 4 MM MISC 4x daily 07/01/18   Shamleffer, Melanie Crazier, MD  lidocaine (XYLOCAINE) 2 % solution Patient: Mix 1part 2% viscous lidocaine, 1part H20. Swish & swallow 64mL of diluted mixture, 80min before meals and at bedtime, up to QID 05/29/18   Tish Men, MD  lidocaine-prilocaine (EMLA) cream Apply a dime size of cream to port one- two hours prior to access. Cover with Saran wrap. 08/07/18   Tish Men, MD  metFORMIN (GLUCOPHAGE-XR) 500 MG 24 hr tablet Take 2 tablets (1,000 mg total) by mouth 2 (two) times daily. 06/19/18 09/17/18  Tish Men, MD  mometasone (ELOCON) 0.1 % cream Apply thin layer to affected areas bid prn 05/05/18   [provider]  sucralfate (CARAFATE) 1 g tablet Take 1 tablet (1 g total) by mouth 4 (four) times daily. 06/29/18   Kyung Rudd, MD  traMADol (ULTRAM) 50 MG tablet Take 1 tablet (50 mg total) by mouth every 8 (eight) hours as needed for moderate pain. 09/04/18   Tish Men, MD  prochlorperazine (COMPAZINE) 10 MG tablet Take 1 tablet (10 mg total) by mouth every 6 (six) hours as needed (Nausea or vomiting). 04/24/18 08/04/18  Tish Men, MD    Allergies    Patient has no known allergies.  Review of Systems   Review of Systems  Physical Exam Updated Vital Signs BP 122/69   Pulse 92   Temp 97.9 F (36.6 C) (Oral)   Resp 20   Ht 5\' 10"  (1.778 m)   Wt 87.1 kg   SpO2 96%   BMI 27.55 kg/m   Physical Exam Vitals and nursing note reviewed.  Constitutional:      General: He is not in acute distress.    Appearance: He is well-developed.     Comments: Chronically ill appearing  HENT:     Head: Normocephalic and atraumatic.  Eyes:     Conjunctiva/sclera: Conjunctivae normal.  Cardiovascular:     Rate and Rhythm: Normal rate  and regular rhythm.     Heart sounds: Normal heart sounds. No murmur.  Pulmonary:     Effort: Pulmonary effort is normal.     Breath sounds: Normal breath sounds.  Abdominal:     General: Bowel sounds are normal. There is no distension.     Palpations: Abdomen is soft.     Tenderness: There is no abdominal tenderness.  Musculoskeletal:     Cervical back: Neck supple.     Right lower leg: No edema.     Left lower  leg: No edema.  Skin:    General: Skin is warm and dry.     Comments: Port in L upper chest  Neurological:     Mental Status: He is alert and oriented to person, place, and time.     Comments: Fluent speech  Psychiatric:        Judgment: Judgment normal.     ED Results / Procedures / Treatments   Labs (all labs ordered are listed, but only abnormal results are displayed) Labs Reviewed  BASIC METABOLIC PANEL - Abnormal; Notable for the following components:      Result Value   Sodium 130 (*)    Chloride 94 (*)    Glucose, Bld 493 (*)    BUN 50 (*)    Creatinine, Ser 2.15 (*)    Calcium 8.2 (*)    GFR calc non Af Amer 32 (*)    GFR calc Af Amer 37 (*)    All other components within normal limits  CBC - Abnormal; Notable for the following components:   RBC 4.06 (*)    Hemoglobin 11.9 (*)    MCHC 29.8 (*)    nRBC 0.3 (*)    All other components within normal limits  URINALYSIS, ROUTINE W REFLEX MICROSCOPIC - Abnormal; Notable for the following components:   Glucose, UA >=500 (*)    All other components within normal limits  URINALYSIS, MICROSCOPIC (REFLEX) - Abnormal; Notable for the following components:   Bacteria, UA RARE (*)    All other components within normal limits  CBG MONITORING, ED - Abnormal; Notable for the following components:   Glucose-Capillary 437 (*)    All other components within normal limits  SARS CORONAVIRUS 2 (TAT 6-24 HRS)  CBG MONITORING, ED  CBG MONITORING, ED    EKG None  Radiology No results  found.  Procedures Procedures (including critical care time) CRITICAL CARE Performed by: Wenda Overland Elijah Michaelis   Total critical care time: 30 minutes  Critical care time was exclusive of separately billable procedures and treating other patients.  Critical care was necessary to treat or prevent imminent or life-threatening deterioration.  Critical care was time spent personally by me on the following activities: development of treatment plan with patient and/or surrogate as well as nursing, discussions with consultants, evaluation of patient's response to treatment, examination of patient, obtaining history from patient or surrogate, ordering and performing treatments and interventions, ordering and review of laboratory studies, ordering and review of radiographic studies, pulse oximetry and re-evaluation of patient's condition.  Medications Ordered in ED Medications  lidocaine (LMX) 4 % cream (has no administration in time range)  sodium chloride 0.9 % bolus 2,000 mL (has no administration in time range)  insulin regular, human (MYXREDLIN) 100 units/ 100 mL infusion (has no administration in time range)  0.9 %  sodium chloride infusion (has no administration in time range)  dextrose 5 %-0.45 % sodium chloride infusion (has no administration in time range)  dextrose 50 % solution 0-50 mL (has no administration in time range)  Insulin Regular(Human) in NaCl (MYXREDLIN) 100-0.9 UT/100ML-% infusion (has no administration in time range)    ED Course  I have reviewed the triage vital signs and the nursing notes.  Pertinent labs & imaging results that were available during my care of the patient were reviewed by me and considered in my medical decision making (see chart for details).    MDM Rules/Calculators/A&P  Patient hypertensive but otherwise stable vital signs.  Lab work notable for blood glucose 493, creatinine 2.15 which is up from baseline of 1.  Hemoglobin  11.9, anion gap normal, UA negative for ketones.  Lab work consistent with hyperglycemia without DKA.  I have ordered endotool order set for BG correction as well as 2L IVF bolus for AKI. Discussed admission w/ [facility], Dr. Maudie Mercury.  Final Clinical Impression(s) / ED Diagnoses Final diagnoses:  AKI (acute kidney injury) ([place])  Hyperglycemia    Rx / DC Orders ED Discharge Orders    None       Riku Buttery, Wenda Overland, MD 03/20/19 2348

## 2019-03-20 NOTE — ED Notes (Signed)
Pt port accessed using sterile technique. Blood return noted. Pt tolerated well.

## 2019-03-21 DIAGNOSIS — N179 Acute kidney failure, unspecified: Secondary | ICD-10-CM | POA: Diagnosis present

## 2019-03-21 DIAGNOSIS — R739 Hyperglycemia, unspecified: Secondary | ICD-10-CM | POA: Diagnosis not present

## 2019-03-21 DIAGNOSIS — C801 Malignant (primary) neoplasm, unspecified: Secondary | ICD-10-CM

## 2019-03-21 DIAGNOSIS — E1142 Type 2 diabetes mellitus with diabetic polyneuropathy: Secondary | ICD-10-CM

## 2019-03-21 DIAGNOSIS — C7801 Secondary malignant neoplasm of right lung: Secondary | ICD-10-CM | POA: Diagnosis not present

## 2019-03-21 LAB — SARS CORONAVIRUS 2 (TAT 6-24 HRS): SARS Coronavirus 2: NEGATIVE

## 2019-03-21 LAB — BASIC METABOLIC PANEL
Anion gap: 9 (ref 5–15)
Anion gap: 9 (ref 5–15)
BUN: 42 mg/dL — ABNORMAL HIGH (ref 8–23)
BUN: 40 mg/dL — ABNORMAL HIGH (ref 8–23)
CO2: 25 mmol/L (ref 22–32)
CO2: 25 mmol/L (ref 22–32)
Calcium: 8 mg/dL — ABNORMAL LOW (ref 8.9–10.3)
Calcium: 8 mg/dL — ABNORMAL LOW (ref 8.9–10.3)
Chloride: 106 mmol/L (ref 98–111)
Chloride: 107 mmol/L (ref 98–111)
Creatinine, Ser: 1.75 mg/dL — ABNORMAL HIGH (ref 0.61–1.24)
Creatinine, Ser: 1.61 mg/dL — ABNORMAL HIGH (ref 0.61–1.24)
GFR calc Af Amer: 48 mL/min — ABNORMAL LOW (ref >60)
GFR calc Af Amer: 53 mL/min — ABNORMAL LOW (ref >60)
GFR calc non Af Amer: 41 mL/min — ABNORMAL LOW (ref >60)
GFR calc non Af Amer: 45 mL/min — ABNORMAL LOW (ref >60)
Glucose, Bld: 169 mg/dL — ABNORMAL HIGH (ref 70–99)
Glucose, Bld: 142 mg/dL — ABNORMAL HIGH (ref 70–99)
Potassium: 4.1 mmol/L (ref 3.5–5.1)
Potassium: 3.9 mmol/L (ref 3.5–5.1)
Sodium: 140 mmol/L (ref 135–145)
Sodium: 141 mmol/L (ref 135–145)

## 2019-03-21 LAB — GLUCOSE, CAPILLARY
Glucose-Capillary: 183 mg/dL — ABNORMAL HIGH (ref 70–99)
Glucose-Capillary: 259 mg/dL — ABNORMAL HIGH (ref 70–99)
Glucose-Capillary: 195 mg/dL — ABNORMAL HIGH (ref 70–99)
Glucose-Capillary: 194 mg/dL — ABNORMAL HIGH (ref 70–99)

## 2019-03-21 LAB — CBG MONITORING, ED
Glucose-Capillary: 114 mg/dL — ABNORMAL HIGH (ref 70–99)
Glucose-Capillary: 82 mg/dL (ref 70–99)
Glucose-Capillary: 130 mg/dL — ABNORMAL HIGH (ref 70–99)
Glucose-Capillary: 132 mg/dL — ABNORMAL HIGH (ref 70–99)

## 2019-03-21 MED ORDER — AMITRIPTYLINE HCL 25 MG PO TABS
50.0000 mg | ORAL_TABLET | Freq: Every day | ORAL | Status: DC
  Administered 2019-03-21: 50 mg via ORAL
  Filled 2019-03-21: qty 2
  Filled 2019-03-21: qty 1

## 2019-03-21 MED ORDER — PREDNISONE 10 MG PO TABS
10.0000 mg | ORAL_TABLET | Freq: Every day | ORAL | Status: DC
  Administered 2019-03-21 – 2019-03-22 (×2): 10 mg via ORAL
  Filled 2019-03-21 (×2): qty 1

## 2019-03-21 MED ORDER — LIDOCAINE VISCOUS HCL 2 % MT SOLN: 15.0000 mL | Freq: Four times a day (QID) | OROMUCOSAL | Status: DC | PRN

## 2019-03-21 MED ORDER — INSULIN ASPART 100 UNIT/ML FLEXPEN
6.0000 [IU] | PEN_INJECTOR | Freq: Three times a day (TID) | SUBCUTANEOUS | Status: DC
  Filled 2019-03-21: qty 3

## 2019-03-21 MED ORDER — INSULIN ASPART 100 UNIT/ML ~~LOC~~ SOLN
0.0000 [IU] | Freq: Three times a day (TID) | SUBCUTANEOUS | Status: DC
  Administered 2019-03-21: 1 [IU] via SUBCUTANEOUS
  Administered 2019-03-21 (×2): 2 [IU] via SUBCUTANEOUS
  Administered 2019-03-22: 1 [IU] via SUBCUTANEOUS
  Administered 2019-03-22: 5 [IU] via SUBCUTANEOUS
  Filled 2019-03-21: qty 1

## 2019-03-21 MED ORDER — MOMETASONE FURO-FORMOTEROL FUM 200-5 MCG/ACT IN AERO
2.0000 | INHALATION_SPRAY | Freq: Two times a day (BID) | RESPIRATORY_TRACT | Status: DC
  Administered 2019-03-21 – 2019-03-22 (×2): 2 via RESPIRATORY_TRACT
  Filled 2019-03-21 (×2): qty 8.8

## 2019-03-21 MED ORDER — INSULIN GLARGINE 100 UNIT/ML ~~LOC~~ SOLN
19.0000 [IU] | Freq: Every day | SUBCUTANEOUS | Status: DC
  Administered 2019-03-21: 19 [IU] via SUBCUTANEOUS
  Filled 2019-03-21: qty 1

## 2019-03-21 MED ORDER — ALBUTEROL SULFATE HFA 108 (90 BASE) MCG/ACT IN AERS
2.0000 | INHALATION_SPRAY | Freq: Four times a day (QID) | RESPIRATORY_TRACT | Status: DC | PRN
  Filled 2019-03-21: qty 6.7

## 2019-03-21 MED ORDER — METFORMIN HCL ER 500 MG PO TB24
1000.0000 mg | ORAL_TABLET | Freq: Two times a day (BID) | ORAL | Status: DC
  Filled 2019-03-21: qty 2

## 2019-03-21 MED ORDER — GABAPENTIN 300 MG PO CAPS
600.0000 mg | ORAL_CAPSULE | Freq: Two times a day (BID) | ORAL | Status: DC
  Administered 2019-03-21: 600 mg via ORAL
  Filled 2019-03-21: qty 2

## 2019-03-21 MED ORDER — GABAPENTIN 300 MG PO CAPS
300.0000 mg | ORAL_CAPSULE | Freq: Every day | ORAL | Status: DC
  Administered 2019-03-21: 300 mg via ORAL
  Filled 2019-03-21: qty 1

## 2019-03-21 MED ORDER — INSULIN GLARGINE 100 UNIT/ML ~~LOC~~ SOLN: 25.0000 [IU] | Freq: Every day | SUBCUTANEOUS | Status: DC

## 2019-03-21 MED ORDER — SODIUM CHLORIDE 0.9 % IV SOLN: INTRAVENOUS | Status: AC

## 2019-03-21 MED ORDER — MORPHINE SULFATE 15 MG PO TABS
15.0000 mg | ORAL_TABLET | Freq: Three times a day (TID) | ORAL | Status: DC | PRN
  Administered 2019-03-21: 15 mg via ORAL
  Filled 2019-03-21: qty 1

## 2019-03-21 MED ORDER — INSULIN ASPART 100 UNIT/ML ~~LOC~~ SOLN
5.0000 [IU] | Freq: Three times a day (TID) | SUBCUTANEOUS | Status: DC
  Administered 2019-03-21 – 2019-03-22 (×4): 5 [IU] via SUBCUTANEOUS

## 2019-03-21 MED ORDER — INSULIN ASPART 100 UNIT/ML FLEXPEN: 5.0000 [IU] | PEN_INJECTOR | Freq: Three times a day (TID) | SUBCUTANEOUS | Status: DC

## 2019-03-21 MED ORDER — ALBUTEROL SULFATE (2.5 MG/3ML) 0.083% IN NEBU: 2.5000 mg | INHALATION_SOLUTION | Freq: Four times a day (QID) | RESPIRATORY_TRACT | Status: DC | PRN

## 2019-03-21 MED ORDER — AMLODIPINE BESYLATE 5 MG PO TABS
5.0000 mg | ORAL_TABLET | Freq: Every day | ORAL | Status: DC
  Administered 2019-03-21 – 2019-03-22 (×2): 5 mg via ORAL
  Filled 2019-03-21 (×2): qty 1

## 2019-03-21 MED ORDER — INSULIN ASPART 100 UNIT/ML ~~LOC~~ SOLN: 0.0000 [IU] | Freq: Every day | SUBCUTANEOUS | Status: DC

## 2019-03-21 MED ORDER — GLIMEPIRIDE 2 MG PO TABS
2.0000 mg | ORAL_TABLET | Freq: Every day | ORAL | Status: DC
  Administered 2019-03-22: 2 mg via ORAL
  Filled 2019-03-21 (×2): qty 1

## 2019-03-21 MED ORDER — INSULIN GLARGINE 100 UNIT/ML ~~LOC~~ SOLN
30.0000 [IU] | Freq: Every day | SUBCUTANEOUS | Status: DC
  Administered 2019-03-21: 30 [IU] via SUBCUTANEOUS
  Filled 2019-03-21 (×2): qty 0.3

## 2019-03-21 NOTE — ED Notes (Signed)
ED TO INPATIENT HANDOFF REPORT  ED Nurse Name and Phone #: [phone] Bethanne Ginger Name/Age/Gender Travis [name] 63 y.o. male Room/Bed: MH02/MH02  Code Status   Code Status: Not on file  Home/SNF/Other Home Patient oriented to: self, place, time and situation Is this baseline? Yes   Triage Complete: Triage complete  Chief Complaint ARF (acute renal failure) ([place]) [N17.9]  Triage Note Pt reports his CBG reading "HIGH" today, states has been in the 200s all day. States weak and dizzy.    Allergies No Known Allergies  Level of Care/Admitting Diagnosis ED Disposition    ED Disposition Condition Comment   Admit  Hospital Area: [facility] [[id number]]  Level of Care: Med-Surg [16]  Covid Evaluation: Asymptomatic Screening Protocol (No Symptoms)  Diagnosis: ARF (acute renal failure) [facility]) [[id number]]  Admitting Physician: Jani Gravel [phone]  Attending Physician: Jani Gravel [phone]       B Medical/Surgery History Past Medical History:  Diagnosis Date  . Arthritis   . Cancer ([place])    lung  . Chronic kidney disease   . Diabetes mellitus    TYPE II  . Dyspnea    at times  . Hypertension   . Pneumonia    "years ago"   Past Surgical History:  Procedure Laterality Date  . COLONOSCOPY W/ POLYPECTOMY    . IR IMAGING GUIDED PORT INSERTION  05/05/2018  . NASAL FRACTURE SURGERY    . VIDEO BRONCHOSCOPY WITH ENDOBRONCHIAL ULTRASOUND Right 04/20/2018   Procedure: VIDEO BRONCHOSCOPY WITH ENDOBRONCHIAL ULTRASOUND;  Surgeon: Juanito Doom, MD;  [facility];  Service: Thoracic;  Laterality: Right;     A IV Location/Drains/Wounds Patient Lines/Drains/Airways Status   Active Line/Drains/Airways    Name:   Placement date:   Placement time:   Site:   Days:   Implanted Port 05/05/18 Left Chest   05/05/18    1508    Chest   320   Incision (Closed) 04/20/18 N/A Other (Comment)   04/20/18    1256     335          Intake/Output Last 24  hours  Intake/Output Summary (Last 24 hours) at 03/21/2019 0716 Last data filed at 03/21/2019 1740 Gross per 24 hour  Intake 2138.58 ml  Output --  Net 2138.58 ml    Labs/Imaging Results for orders placed or performed during the hospital encounter of 03/20/19 (from the past 48 hour(s))  CBG monitoring, ED     Status: Abnormal   Collection Time: 03/20/19  8:57 PM  Result Value Ref Range   Glucose-Capillary 437 (H) 70 - 99 mg/dL  Basic metabolic panel     Status: Abnormal   Collection Time: 03/20/19  9:05 PM  Result Value Ref Range   Sodium 130 (L) 135 - 145 mmol/L   Potassium 4.6 3.5 - 5.1 mmol/L   Chloride 94 (L) 98 - 111 mmol/L   CO2 22 22 - 32 mmol/L   Glucose, Bld 493 (H) 70 - 99 mg/dL   BUN 50 (H) 8 - 23 mg/dL   Creatinine, Ser 2.15 (H) 0.61 - 1.24 mg/dL   Calcium 8.2 (L) 8.9 - 10.3 mg/dL   GFR calc non Af Amer 32 (L) >60 mL/min   GFR calc Af Amer 37 (L) >60 mL/min   Anion gap 14 5 - 15    Comment: Performed at [facility], [address] [place], [place] [address]  CBC     Status: Abnormal  Collection Time: 03/20/19  9:05 PM  Result Value Ref Range   WBC 9.0 4.0 - 10.5 K/uL   RBC 4.06 (L) 4.22 - 5.81 MIL/uL   Hemoglobin 11.9 (L) 13.0 - 17.0 g/dL   HCT 40.0 39.0 - 52.0 %   MCV 98.5 80.0 - 100.0 fL   MCH 29.3 26.0 - 34.0 pg   MCHC 29.8 (L) 30.0 - 36.0 g/dL   RDW 14.5 11.5 - 15.5 %   Platelets 156 150 - 400 K/uL   nRBC 0.3 (H) 0.0 - 0.2 %    Comment: Performed at [facility], [address] [place], [place] [address]  Urinalysis, Routine w reflex microscopic     Status: Abnormal   Collection Time: 03/20/19  9:05 PM  Result Value Ref Range   Color, Urine YELLOW YELLOW   APPearance CLEAR CLEAR   Specific Gravity, Urine 1.010 1.005 - 1.030   pH 6.0 5.0 - 8.0   Glucose, UA >=500 (A) NEGATIVE mg/dL   Hgb urine dipstick NEGATIVE NEGATIVE   Bilirubin Urine NEGATIVE NEGATIVE   Ketones, ur NEGATIVE NEGATIVE mg/dL   Protein, ur NEGATIVE  NEGATIVE mg/dL   Nitrite NEGATIVE NEGATIVE   Leukocytes,Ua NEGATIVE NEGATIVE    Comment: Performed at [facility], [address] [place], [place] [address]  Urinalysis, Microscopic (reflex)     Status: Abnormal   Collection Time: 03/20/19  9:05 PM  Result Value Ref Range   RBC / HPF 0-5 0 - 5 RBC/hpf   WBC, UA 0-5 0 - 5 WBC/hpf   Bacteria, UA RARE (A) NONE SEEN   Squamous Epithelial / LPF 0-5 0 - 5    Comment: Performed at [facility], [address] [place], [place] [address]  CBG monitoring, ED     Status: Abnormal   Collection Time: 03/20/19 11:06 PM  Result Value Ref Range   Glucose-Capillary 462 (H) 70 - 99 mg/dL  SARS CORONAVIRUS 2 (TAT 6-24 HRS) Nasopharyngeal Nasopharyngeal Swab     Status: None   Collection Time: 03/20/19 11:07 PM   Specimen: Nasopharyngeal Swab  Result Value Ref Range   SARS Coronavirus 2 NEGATIVE NEGATIVE    Comment: (NOTE) SARS-CoV-2 target nucleic acids are NOT DETECTED. The SARS-CoV-2 RNA is generally detectable in upper and lower respiratory specimens during the acute phase of infection. Negative results do not preclude SARS-CoV-2 infection, do not rule out co-infections with other pathogens, and should not be used as the sole basis for treatment or other patient management decisions. Negative results must be combined with clinical observations, patient history, and epidemiological information. The expected result is Negative. Fact Sheet for Patients: SugarRoll.be Fact Sheet for Healthcare Providers: https://www.woods-mathews.com/ This test is not yet approved or cleared by the [place] FDA and  has been authorized for detection and/or diagnosis of SARS-CoV-2 by FDA under an Emergency Use Authorization (EUA). This EUA will remain  in effect (meaning this test can be used) for the duration of the COVID-19 declaration under Section 56 4(b)(1) of the Act, 21 U.S.C. section  360bbb-3(b)(1), unless the authorization is terminated or revoked sooner. Performed at [facility], [place] [address] [place], [place] [address]   CBG monitoring, ED     Status: Abnormal   Collection Time: 03/20/19 11:57 PM  Result Value Ref Range   Glucose-Capillary 300 (H) 70 - 99 mg/dL  CBG monitoring, ED     Status: Abnormal   Collection Time: 03/21/19  12:58 AM  Result Value Ref Range   Glucose-Capillary 114 (H) 70 - 99 mg/dL  CBG monitoring, ED     Status: None   Collection Time: 03/21/19  1:56 AM  Result Value Ref Range   Glucose-Capillary 82 70 - 99 mg/dL  CBG monitoring, ED     Status: Abnormal   Collection Time: 03/21/19  2:59 AM  Result Value Ref Range   Glucose-Capillary 130 (H) 70 - 99 mg/dL  Basic metabolic panel     Status: Abnormal   Collection Time: 03/21/19  4:40 AM  Result Value Ref Range   Sodium 140 135 - 145 mmol/L    Comment: DELTA CHECK NOTED   Potassium 4.1 3.5 - 5.1 mmol/L   Chloride 106 98 - 111 mmol/L   CO2 25 22 - 32 mmol/L   Glucose, Bld 169 (H) 70 - 99 mg/dL   BUN 42 (H) 8 - 23 mg/dL   Creatinine, Ser 1.75 (H) 0.61 - 1.24 mg/dL   Calcium 8.0 (L) 8.9 - 10.3 mg/dL   GFR calc non Af Amer 41 (L) >60 mL/min   GFR calc Af Amer 48 (L) >60 mL/min   Anion gap 9 5 - 15    Comment: Performed at [facility], [address] [place], [place] [address]   No results found.  Pending Labs Unresulted Labs (From admission, onward)    Start     Ordered   03/21/19 8022  Basic metabolic panel  Once,   STAT     03/21/19 0644          Vitals/Pain Today's Vitals   03/21/19 0515 03/21/19 0530 03/21/19 0600 03/21/19 0630  BP:  130/79 137/77 128/80  Pulse: 88 87 88 88  Resp:   18 19  Temp:      TempSrc:      SpO2: 94% 95% 98% 94%  Weight:      Height:      PainSc:        Isolation Precautions No active isolations  Medications Medications  lidocaine (LMX) 4 % cream (has no administration in time range)  dextrose 50 % solution  0-50 mL (has no administration in time range)  albuterol (VENTOLIN HFA) 108 (90 Base) MCG/ACT inhaler 2 puff (has no administration in time range)  amitriptyline (ELAVIL) tablet 50 mg (50 mg Oral Not Given 03/21/19 0135)  amLODipine (NORVASC) tablet 5 mg (has no administration in time range)  mometasone-formoterol (DULERA) 200-5 MCG/ACT inhaler 2 puff (2 puffs Inhalation Not Given 03/21/19 0133)  gabapentin (NEURONTIN) capsule 600 mg (600 mg Oral Given 03/21/19 0308)  glimepiride (AMARYL) tablet 2 mg (has no administration in time range)  insulin aspart (NOVOLOG) FlexPen 6 Units (has no administration in time range)  insulin glargine (LANTUS) injection 19 Units (19 Units Subcutaneous Given 03/21/19 0224)  0.9 %  sodium chloride infusion ( Intravenous Rate/Dose Change 03/21/19 0715)  insulin aspart (novoLOG) injection 0-9 Units (has no administration in time range)  insulin aspart (novoLOG) injection 0-5 Units (has no administration in time range)  sodium chloride 0.9 % bolus 2,000 mL (0 mLs Intravenous Stopped 03/21/19 0031)    Mobility walks Low fall risk   Focused Assessments Renal Assessment Handoff:BMP improving  R Recommendations: See Admitting Provider Note  Report given to:   Additional Notes:

## 2019-03-21 NOTE — ED Notes (Signed)
Report given to [facility]. 

## 2019-03-21 NOTE — H&P (Signed)
History and Physical    Travis [name]:[id number] DOB: November 22, 1955 DOA: 03/20/2019  Referring MD/NP/PA: [facility] PCP:  Patient coming from: Home  Chief Complaint: High blood sugars  HPI: Travis [name] is a 63 y.o. male with medical history significant of stage IIIa lung cancer, status post chemo/XRT followed by immunotherapy, type 2 diabetes mellitus on insulin, diabetic neuropathy, bilateral osteoarthritis knees, chronic pain, COPD, chronic diastolic CHF presented to the ED with the above complaints. -Patient reports being in his usual state of health yesterday however his blood sugars were persistently high, patient reports compliance with his Lantus as well as NovoLog premeal yesterday however did have some juice/soda in the afternoon, last night his meter read too high, he also noticed some weakness and presented to med [facility] ER, his CBG was noted to be 493, with normal bicarb and creatinine was 2.1, which was elevated compared to his baseline of 1.0. -He denies any fevers or chills, no cough congestion shortness of breath, no nausea vomiting -Please note patient is on a prednisone taper which was started by his pulmonologist at [facility], currently being tapered off, per their notes in epic due to be completed in the first week of January  Review of Systems: As per HPI otherwise 14 point review of systems negative.   Past Medical History:  Diagnosis Date  . Arthritis   . Cancer ([place])    lung  . Chronic kidney disease   . Diabetes mellitus    TYPE II  . Dyspnea    at times  . Hypertension   . Pneumonia    "years ago"    Past Surgical History:  Procedure Laterality Date  . COLONOSCOPY W/ POLYPECTOMY    . IR IMAGING GUIDED PORT INSERTION  05/05/2018  . NASAL FRACTURE SURGERY    . VIDEO BRONCHOSCOPY WITH ENDOBRONCHIAL ULTRASOUND Right 04/20/2018   Procedure: VIDEO BRONCHOSCOPY WITH ENDOBRONCHIAL ULTRASOUND;  Surgeon: Juanito Doom, MD;  Location: [facility];  Service: Thoracic;  Laterality: Right;     reports that he quit smoking about a year ago. His smoking use included cigarettes. He has a 45.00 pack-year smoking history. He quit smokeless tobacco use about 46 years ago.  His smokeless tobacco use included chew. He reports current alcohol use of about 2.0 standard drinks of alcohol per week. He reports that he does not use drugs.  No Known Allergies  Family History  Problem Relation Age of Onset  . Leukemia Sister   . Cancer Brother   . Diabetes Mother      Prior to Admission medications   Medication Sig Start Date End Date Taking? Authorizing Provider  albuterol (VENTOLIN HFA) 108 (90 Base) MCG/ACT inhaler Inhale 2 puffs into the lungs every 6 (six) hours as needed for wheezing or shortness of breath. 07/09/18   Tish Men, MD  amitriptyline (ELAVIL) 25 MG tablet Take 50 mg by mouth at bedtime. 04/03/18   [provider]  amLODipine (NORVASC) 5 MG tablet Take 5 mg by mouth daily. 04/10/18   [provider]  budesonide-formoterol (SYMBICORT) 160-4.5 MCG/ACT inhaler Inhale 2 puffs into the lungs 2 (two) times daily. 07/09/18   Tish Men, MD  diclofenac (VOLTAREN) 75 MG EC tablet Take 75 mg by mouth 2 (two) times daily. 04/03/18   [provider]  famotidine (PEPCID) 20 MG tablet Take 20 mg by mouth 2 (two) times daily. 03/11/19   [provider]  furosemide (LASIX) 40 MG tablet Take 1 tablet (  40 mg total) by mouth daily for 30 days. 08/07/18 09/06/18  Tish Men, MD  gabapentin (NEURONTIN) 300 MG capsule Take 2 capsules by mouth twice daily 12/25/18   Tish Men, MD  glimepiride (AMARYL) 2 MG tablet TAKE 1 TABLET BY MOUTH ONCE DAILY BEFORE BREAKFAST 08/01/18   [provider]  glucose blood test strip 4x a day 07/01/18   Shamleffer, Melanie Crazier, MD  insulin aspart (NOVOLOG FLEXPEN) 100 UNIT/ML FlexPen Inject 6 Units into the skin 3 (three) times daily with meals. 07/01/18   Shamleffer, Melanie Crazier, MD    Insulin Glargine (LANTUS) 100 UNIT/ML Solostar Pen Inject 19 Units into the skin at bedtime for 30 days. 07/01/18 07/31/18  Shamleffer, Melanie Crazier, MD  Insulin Pen Needle (BD PEN NEEDLE NANO U/F) 32G X 4 MM MISC 4x daily 07/01/18   Shamleffer, Melanie Crazier, MD  lidocaine (XYLOCAINE) 2 % solution Patient: Mix 1part 2% viscous lidocaine, 1part H20. Swish & swallow 95mL of diluted mixture, 14min before meals and at bedtime, up to QID 05/29/18   Tish Men, MD  lidocaine-prilocaine (EMLA) cream Apply a dime size of cream to port one- two hours prior to access. Cover with Saran wrap. 08/07/18   Tish Men, MD  losartan (COZAAR) 50 MG tablet Take 50 mg by mouth daily. 03/15/19   [provider]  magnesium oxide (MAG-OX) 400 (241.3 Mg) MG tablet Take 1 tablet by mouth 2 (two) times daily. 03/15/19   [provider]  metFORMIN (GLUCOPHAGE-XR) 500 MG 24 hr tablet Take 2 tablets (1,000 mg total) by mouth 2 (two) times daily. 06/19/18 09/17/18  Tish Men, MD  metoprolol succinate (TOPROL-XL) 50 MG 24 hr tablet Take 50 mg by mouth daily. 03/13/19   [provider]  mometasone (ELOCON) 0.1 % cream Apply thin layer to affected areas bid prn 05/05/18   [provider]  morphine (MSIR) 15 MG tablet Take 15 mg by mouth every 8 (eight) hours as needed for pain. 01/22/19   [provider]  pantoprazole (PROTONIX) 40 MG tablet Take 40 mg by mouth daily. 03/05/19   [provider]  potassium chloride (KLOR-CON) 10 MEQ tablet Take 10 mEq by mouth daily. 03/11/19   [provider]  predniSONE (DELTASONE) 20 MG tablet Take 80 mg by mouth every morning. 02/26/19   [provider]  sucralfate (CARAFATE) 1 g tablet Take 1 tablet (1 g total) by mouth 4 (four) times daily. 06/29/18   Kyung Rudd, MD  sulfamethoxazole-trimethoprim (BACTRIM DS) 800-160 MG tablet Take 1 tablet by mouth 3 (three) times a week. 02/26/19   [provider]  torsemide (DEMADEX) 20  MG tablet Take 20 mg by mouth 2 (two) times daily as needed. 03/11/19   [provider]  traMADol (ULTRAM) 50 MG tablet Take 1 tablet (50 mg total) by mouth every 8 (eight) hours as needed for moderate pain. 09/04/18   Tish Men, MD  TRELEGY ELLIPTA 100-62.5-25 MCG/INH AEPB Inhale 1 puff into the lungs daily. 02/26/19   [provider]  prochlorperazine (COMPAZINE) 10 MG tablet Take 1 tablet (10 mg total) by mouth every 6 (six) hours as needed (Nausea or vomiting). 04/24/18 08/04/18  Tish Men, MD    Physical Exam: Vitals:   03/21/19 0515 03/21/19 0530 03/21/19 0600 03/21/19 0630  BP:  130/79 137/77 128/80  Pulse: 88 87 88 88  Resp:   18 19  Temp:      TempSrc:      SpO2: 94% 95%  98% 94%  Weight:      Height:          Constitutional: AAOx3, no distress, sitting comfortably in bed Vitals:   03/21/19 0515 03/21/19 0530 03/21/19 0600 03/21/19 0630  BP:  130/79 137/77 128/80  Pulse: 88 87 88 88  Resp:   18 19  Temp:      TempSrc:      SpO2: 94% 95% 98% 94%  Weight:      Height:       HEENT, oral mucosa is dry, muddy sclera, no icterus Respiratory: Poor air movement bilaterally, otherwise clear Left chest with Port-A-Cath noted Cardiovascular: S1-S2, regular rate rhythm Abdomen: soft, non tender, Bowel sounds positive.  Musculoskeletal: No joint deformity upper and lower extremities. Ext: No edema Skin: no rashes, lesions, ulcers.  Neurologic: Moves all extremities, no localizing signs Psychiatric: Normal insight and judgment  Labs on Admission: I have personally reviewed following labs and imaging studies  CBC: Recent Labs  Lab 03/20/19 2105  WBC 9.0  HGB 11.9*  HCT 40.0  MCV 98.5  PLT 202   Basic Metabolic Panel: Recent Labs  Lab 03/20/19 2105 03/21/19 0440 03/21/19 0659  NA 130* 140 141  K 4.6 4.1 3.9  CL 94* 106 107  CO2 22 25 25   GLUCOSE 493* 169* 142*  BUN 50* 42* 40*  CREATININE 2.15* 1.75* 1.61*  CALCIUM 8.2* 8.0* 8.0*    GFR: Estimated Creatinine Clearance: 49.7 mL/min (A) (by C-G formula based on SCr of 1.61 mg/dL (H)). Liver Function Tests: No results for input(s): AST, ALT, ALKPHOS, BILITOT, PROT, ALBUMIN in the last 168 hours. No results for input(s): LIPASE, AMYLASE in the last 168 hours. No results for input(s): AMMONIA in the last 168 hours. Coagulation Profile: No results for input(s): INR, PROTIME in the last 168 hours. Cardiac Enzymes: No results for input(s): CKTOTAL, CKMB, CKMBINDEX, TROPONINI in the last 168 hours. BNP (last 3 results) No results for input(s): PROBNP in the last 8760 hours. HbA1C: No results for input(s): HGBA1C in the last 72 hours. CBG: Recent Labs  Lab 03/20/19 2357 03/21/19 0058 03/21/19 0156 03/21/19 0259 03/21/19 0756  GLUCAP 300* 114* 82 130* 132*   Lipid Profile: No results for input(s): CHOL, HDL, LDLCALC, TRIG, CHOLHDL, LDLDIRECT in the last 72 hours. Thyroid Function Tests: No results for input(s): TSH, T4TOTAL, FREET4, T3FREE, THYROIDAB in the last 72 hours. Anemia Panel: No results for input(s): VITAMINB12, FOLATE, FERRITIN, TIBC, IRON, RETICCTPCT in the last 72 hours. Urine analysis:    Component Value Date/Time   COLORURINE YELLOW 03/20/2019 2105   APPEARANCEUR CLEAR 03/20/2019 2105   LABSPEC 1.010 03/20/2019 2105   PHURINE 6.0 03/20/2019 2105   GLUCOSEU >=500 (A) 03/20/2019 2105   HGBUR NEGATIVE 03/20/2019 2105   [place] NEGATIVE 03/20/2019 2105   [place] NEGATIVE 03/20/2019 2105   PROTEINUR NEGATIVE 03/20/2019 2105   NITRITE NEGATIVE 03/20/2019 2105   LEUKOCYTESUR NEGATIVE 03/20/2019 2105   Sepsis Labs: @LABRCNTIP (procalcitonin:4,lacticidven:4) ) Recent Results (from the past 240 hour(s))  SARS CORONAVIRUS 2 (TAT 6-24 HRS) Nasopharyngeal Nasopharyngeal Swab     Status: None   Collection Time: 03/20/19 11:07 PM   Specimen: Nasopharyngeal Swab  Result Value Ref Range Status   SARS Coronavirus 2 NEGATIVE NEGATIVE Final     Comment: (NOTE) SARS-CoV-2 target nucleic acids are NOT DETECTED. The SARS-CoV-2 RNA is generally detectable in upper and lower respiratory specimens during the acute phase of infection. Negative results do not preclude SARS-CoV-2 infection, do not rule  out co-infections with other pathogens, and should not be used as the sole basis for treatment or other patient management decisions. Negative results must be combined with clinical observations, patient history, and epidemiological information. The expected result is Negative. Fact Sheet for Patients: SugarRoll.be Fact Sheet for Healthcare Providers: https://www.woods-mathews.com/ This test is not yet approved or cleared by the [place] FDA and  has been authorized for detection and/or diagnosis of SARS-CoV-2 by FDA under an Emergency Use Authorization (EUA). This EUA will remain  in effect (meaning this test can be used) for the duration of the COVID-19 declaration under Section 56 4(b)(1) of the Act, 21 U.S.C. section 360bbb-3(b)(1), unless the authorization is terminated or revoked sooner. Performed at [facility], [place] [address], [place], [place] [address]      Radiological Exams on Admission: No results found.  EKG: Independently reviewed.  Not done, pending at this time  Assessment/Plan  Acute kidney injury -Secondary to dehydration from hyperglycemia/osmotic diuresis, worsened by ongoing NSAID, ARB use -Creatinine 2.1 on admission, baseline is 1.0, improving with hydration, continue IV fluids today, hold ARB and diclofenac -Monitor urine output, urinalysis without proteinuria  Hyperglycemia/hyperosmolar nonketotic hyperglycemia -Likely triggered by dietary indiscretion and ongoing prednisone use -Resume Lantus at higher dose, taper off prednisone more quickly, per pulmonary notes due to and the first week of January, will taper off over the next 4 to 5 days at a lower  dose -Add NovoLog premeal, glimepiride -Check hemoglobin A1c  COPD/ongoing dyspnea -Felt to be secondary to recent XRT plus or minus immunotherapy, and underlying COPD -Follow-up with pulmonary at [facility] off prednisone as noted above  Stage IIIa lung cancer -Status post chemo/XRT followed by immunotherapy -Currently under surveillance, follow-up with oncology  Chronic diastolic CHF -Clinically dry at this time, hold diuretics and ARB  Chronic pain/osteoarthritis/diabetic peripheral neuropathy -Resume gabapentin at lower dose, continue morphine IR per home regimen  DVT prophylaxis: Lovenox Code Status: Full code Family Communication: No family at bedside Disposition Plan: Home tomorrow if improved Consults called: None Admission status: Observation  Domenic Polite MD [facility]  03/21/2019, 9:50 AM

## 2019-03-21 NOTE — ED Notes (Signed)
Nurse Verdis Frederickson to call back for report.

## 2019-03-21 NOTE — ED Provider Notes (Addendum)
3:45 AM  Pt awaiting bed at [facility] for AKI and hyperglycemia without DKA.  Blood sugars have improved nicely and we have stopped insulin infusion and dextrose infusion.  We will continue normal saline infusion and plan will be to recheck BMP.  We will also update hospitalist.  Patient no longer needs progressive bed.      Eduard Penkala, Delice Bison, DO 03/21/19 0351   5:10 AM  Pt's creatinine improving but still at 1.75 with a GFR of 41.  Will continue hydration.  I do not feel patient is safe for discharge at this time.  Will update hospitalist.   Tanisa Lagace, Delice Bison, DO 03/21/19 6861

## 2019-03-22 DIAGNOSIS — E1142 Type 2 diabetes mellitus with diabetic polyneuropathy: Secondary | ICD-10-CM | POA: Diagnosis not present

## 2019-03-22 DIAGNOSIS — J449 Chronic obstructive pulmonary disease, unspecified: Secondary | ICD-10-CM | POA: Diagnosis not present

## 2019-03-22 DIAGNOSIS — N179 Acute kidney failure, unspecified: Secondary | ICD-10-CM | POA: Diagnosis not present

## 2019-03-22 LAB — BASIC METABOLIC PANEL
Anion gap: 7 (ref 5–15)
BUN: 26 mg/dL — ABNORMAL HIGH (ref 8–23)
CO2: 25 mmol/L (ref 22–32)
Calcium: 8.3 mg/dL — ABNORMAL LOW (ref 8.9–10.3)
Chloride: 106 mmol/L (ref 98–111)
Creatinine, Ser: 1.43 mg/dL — ABNORMAL HIGH (ref 0.61–1.24)
GFR calc Af Amer: >60 mL/min (ref >60)
GFR calc non Af Amer: 52 mL/min — ABNORMAL LOW (ref >60)
Glucose, Bld: 308 mg/dL — ABNORMAL HIGH (ref 70–99)
Potassium: 4.6 mmol/L (ref 3.5–5.1)
Sodium: 138 mmol/L (ref 135–145)

## 2019-03-22 LAB — GLUCOSE, CAPILLARY
Glucose-Capillary: 134 mg/dL — ABNORMAL HIGH (ref 70–99)
Glucose-Capillary: 261 mg/dL — ABNORMAL HIGH (ref 70–99)

## 2019-03-22 LAB — OSMOLALITY, URINE: Osmolality, Ur: 558 mOsm/kg (ref 300–900)

## 2019-03-22 MED ORDER — METOPROLOL SUCCINATE ER 50 MG PO TB24
50.0000 mg | ORAL_TABLET | Freq: Every day | ORAL | Status: DC
  Administered 2019-03-22: 50 mg via ORAL
  Filled 2019-03-22: qty 1

## 2019-03-22 MED ORDER — TORSEMIDE 20 MG PO TABS: 20.0000 mg | ORAL_TABLET | Freq: Two times a day (BID) | ORAL | Status: DC

## 2019-03-22 MED ORDER — PREDNISONE 20 MG PO TABS: 10.0000 mg | ORAL_TABLET | Freq: Every day | ORAL | 0 refills | Status: AC

## 2019-03-22 MED ORDER — INSULIN GLARGINE 100 UNIT/ML SOLOSTAR PEN: 25.0000 [IU] | PEN_INJECTOR | Freq: Every day | SUBCUTANEOUS | 3 refills | Status: DC

## 2019-03-22 NOTE — Progress Notes (Signed)
   03/22/19 1400  Vitals  Temp 97.8 F (36.6 C)  Temp Source Oral  BP 139/81  MAP (mmHg) 97  BP Location Right Arm  BP Method Automatic  Patient Position (if appropriate) Sitting  Pulse Rate (!) 118  Pulse Rate Source Monitor  Resp 18  Oxygen Therapy  SpO2 98 %  O2 Device Room Air  MEWS Score  MEWS RR 0  MEWS Pulse 2  MEWS Systolic 0  MEWS LOC 0  MEWS Temp 0  MEWS Score 2  MEWS Score Color Yellow  MEWS Assessment  Is this an acute change? Yes  MEWS guidelines implemented *See [place]  Provider Notification  Provider Name/Title Dr. Broadus John  Date Provider Notified 03/22/19  Time Provider Notified 1450  Notification Type Call  Notification Reason Change in status  Response See new orders (Metoprolol ordered and check after 1 hr.)

## 2019-03-23 NOTE — Discharge Summary (Signed)
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 male with medical history significant of stage IIIa lung cancer, status post chemo/XRT followed by immunotherapy, type 2 diabetes mellitus on insulin, diabetic neuropathy, bilateral osteoarthritis knees, chronic pain, COPD, chronic diastolic CHF presented to the ED with the above complaints. -Patient reports being in his usual state of health yesterday however his blood sugars were persistently high, patient reports compliance with his Lantus as well as NovoLog premeal yesterday however did have some juice/soda in the afternoon, last night his meter read too high, he also noticed some weakness and presented to med [facility] ER, his CBG was noted to be 493, with normal bicarb and creatinine was 2.1, which was elevated compared to his baseline of 1.0. -He denies any fevers or chills, no cough congestion shortness of breath, no nausea vomiting -Please note patient is on a prednisone taper which was started by his pulmonologist at [facility], currently being tapered off, per their notes in epic due to  be completed in the first week of January  Hospital Course:   Acute kidney injury -Secondary to dehydration from hyperglycemia/osmotic diuresis, worsened by ongoing diuresis,, ARB use -Creatinine 2.1 on admission, baseline is 1.0, improving with hydration down to 1.4 at discharge, ARB discontinued and advised to hold torsemide for 1 more day -Follow-up with PCP, repeat labs in 1 week  Hyperglycemia/hyperosmolar nonketotic hyperglycemia -Likely triggered by dietary indiscretion and ongoing prednisone use -Required insulin drip briefly and then switched over to Lantus at higher dose, taper off prednisone more quickly, per pulmonary notes due to and the first week of January, will taper off over the next 4 days at a lower dose -Continue NovoLog premeal, glimepiride, CBG stable  COPD/ongoing dyspnea -Felt to be secondary to recent XRT plus or minus immunotherapy, and underlying COPD -Follow-up with pulmonary at [place] off prednisone as noted above  Stage IIIa lung cancer -Status post chemo/XRT followed by immunotherapy -Currently under surveillance, follow-up with oncology  Chronic diastolic CHF -Clinically dry at this time, hold diuretics and ARB  Chronic pain/osteoarthritis/diabetic peripheral neuropathy -Resumed gabapentin , continue morphine IR per home regimen   Discharge Exam: Vitals:   03/22/19 1607 03/22/19 1625  BP: (!) 159/77   Pulse: (!) 112 (!) 110  Resp: 18   Temp: 97.9 F (36.6 C)   SpO2: 99%     General: AAOx3 Cardiovascular: S1S2/RRR Respiratory: CTAB  Discharge Instructions   Discharge Instructions    Diet - low sodium heart healthy   Complete by: As directed    Increase activity slowly   Complete by: As directed      Allergies as of 03/22/2019   No Known Allergies  Medication List    STOP taking these medications   furosemide 40 MG tablet Commonly known as: Lasix   lidocaine-prilocaine cream Commonly known as: EMLA    losartan 50 MG tablet Commonly known as: COZAAR   sulfamethoxazole-trimethoprim 800-160 MG tablet Commonly known as: BACTRIM DS     TAKE these medications   albuterol 108 (90 Base) MCG/ACT inhaler Commonly known as: VENTOLIN HFA Inhale 2 puffs into the lungs every 6 (six) hours as needed for wheezing or shortness of breath.   amitriptyline 50 MG tablet Commonly known as: ELAVIL Take 50 mg by mouth at bedtime.   amLODipine 5 MG tablet Commonly known as: NORVASC Take 5 mg by mouth daily.   budesonide-formoterol 160-4.5 MCG/ACT inhaler Commonly known as: Symbicort Inhale 2 puffs into the lungs 2 (two) times daily.   famotidine 20 MG tablet Commonly known as: PEPCID Take 20 mg by mouth 2 (two) times daily.   gabapentin 300 MG capsule Commonly known as: NEURONTIN Take 2 capsules by mouth twice daily What changed: how much to take   glimepiride 2 MG tablet Commonly known as: AMARYL Take 2 mg by mouth daily with breakfast.   glucose blood test strip 4x a day   insulin aspart 100 UNIT/ML FlexPen Commonly known as: NovoLOG FlexPen Inject 6 Units into the skin 3 (three) times daily with meals. What changed:   how much to take  when to take this  reasons to take this   Insulin Glargine 100 UNIT/ML Solostar Pen Commonly known as: LANTUS Inject 25 Units into the skin at bedtime. What changed: how much to take   Insulin Pen Needle 32G X 4 MM Misc Commonly known as: BD Pen Needle Nano U/F 4x daily   lidocaine 2 % solution Commonly known as: XYLOCAINE Patient: Mix 1part 2% viscous lidocaine, 1part H20. Swish & swallow 38mL of diluted mixture, 59min before meals and at bedtime, up to QID What changed:   how much to take  how to take this  when to take this  reasons to take this   magnesium oxide 400 (241.3 Mg) MG tablet Commonly known as: MAG-OX Take 1 tablet by mouth 2 (two) times daily.   metFORMIN 500 MG 24 hr tablet Commonly known as:  GLUCOPHAGE-XR Take 2 tablets (1,000 mg total) by mouth 2 (two) times daily.   metoprolol succinate 50 MG 24 hr tablet Commonly known as: TOPROL-XL Take 50 mg by mouth daily.   morphine 15 MG tablet Commonly known as: MSIR Take 15 mg by mouth every 8 (eight) hours as needed for pain.   pantoprazole 40 MG tablet Commonly known as: PROTONIX Take 40 mg by mouth daily.   potassium chloride 10 MEQ tablet Commonly known as: KLOR-CON Take 10 mEq by mouth daily.   predniSONE 20 MG tablet Commonly known as: DELTASONE Take 0.5 tablets (10 mg total) by mouth daily for 3 days. What changed: how much to take   sucralfate 1 g tablet Commonly known as: Carafate Take 1 tablet (1 g total) by mouth 4 (four) times daily.   torsemide 20 MG tablet Commonly known as: DEMADEX Take 1 tablet (20 mg total) by mouth 2 (two) times daily. Start taking on: March 24, 2019   traMADol 50 MG tablet Commonly known as: ULTRAM Take 1 tablet (50 mg total) by mouth every 8 (eight) hours as needed for moderate pain.   Trelegy Ellipta 100-62.5-25 MCG/INH Aepb Generic drug: Fluticasone-Umeclidin-Vilant Inhale 1 puff into the lungs daily.  No Known Allergies    The results of significant diagnostics from this hospitalization (including imaging, microbiology, ancillary and laboratory) are listed below for reference.    Significant Diagnostic Studies: CT Angio Chest PE W and/or Wo Contrast  Result Date: 02/23/2019 CLINICAL DATA:  Prior lung cancer, suspect pulmonary embolism with shortness of breath and chest pain since last Thursday night. EXAM: CT ANGIOGRAPHY CHEST WITH CONTRAST TECHNIQUE: Multidetector CT imaging of the chest was performed using the standard protocol during bolus administration of intravenous contrast. Multiplanar CT image reconstructions and MIPs were obtained to evaluate the vascular anatomy. Study quality limited by respiratory motion with respect to segmental and smaller vessels.  CONTRAST:  125mL OMNIPAQUE IOHEXOL 350 MG/ML SOLN COMPARISON:  Chest CT 10/12/2018 FINDINGS: Cardiovascular: Moderate calcific and noncalcific atherosclerotic change throughout the thoracic aorta. Heart size normal without pericardial effusion. Pulmonary arterial assessment is limited secondary to respiratory motion. No central pulmonary embolus or signs of lobar embolus. Peripheral vascular branches with limited assessment. Mediastinum/Nodes: No signs of adenopathy in the chest. Esophagus and thoracic inlet structures are normal. Lungs/Pleura: More extensive citric is a shin seen in the right upper chest with pleural and parenchymal scarring. Signs of ground-glass opacity and septal thickening in the right mid chest about the ill-defined area arising from posterior right hilum with decreased size of pulmonary mass since the prior exam now measuring approximately 2.2 x 1.5 cm previously approximately 2.7 x 1.5 cm. Ground-glass and septal thickening with some areas of more pronounced bandlike thickening extending to the pleural surface with the dominant mass distorting the fissure as on the previous study and with areas of nodular septal thickening and some ground-glass extending further inferiorly into the posterior right middle lobe from the superior segment. No signs of dense consolidation. No signs of pleural effusion. Airways are patent. Stable tiny nodule in the anterior left chest approximately 4 mm. Upper Abdomen: No sign of acute upper abdominal process. The visualized adrenal glands which are incompletely imaged are unremarkable. Musculoskeletal: Signs of spinal degenerative change. No evidence of acute bone finding or of destructive bone process. Review of the MIP images confirms the above findings. IMPRESSION: 1. No central pulmonary embolus or signs of lobar embolus. Peripheral vascular branches with limited assessment due to respiratory motion. 2. Interval decrease in the size of the mass-like area  along the major fissure in the right chest but with increasing septal thickening, ground-glass involving areas of previously radiated lung. Findings may simply represent evolving radiation pneumonitis though changes could be related to pneumonitis or radiation recall in the setting of immunotherapy or superimposed pneumonitis from infection. Clinical correlation is suggested 3. Follow-up will be critical to exclude the possibility of lymphangitic carcinomatosis in this area as well. 4. Previously described pulmonary nodules surrounding the dominant lesion are obscured by this process. 5. Aortic atherosclerosis. Aortic Atherosclerosis (ICD10-I70.0). Electronically Signed   By: Zetta Bills M.D.   On: 02/23/2019 18:04   US Venous Img Lower Bilateral  Result Date: 02/23/2019 CLINICAL DATA:  Bilateral lower extremity pain and swelling, new this week. EXAM: BILATERAL LOWER EXTREMITY VENOUS DOPPLER ULTRASOUND TECHNIQUE: Gray-scale sonography with graded compression, as well as color Doppler and duplex ultrasound were performed to evaluate the lower extremity deep venous systems from the level of the common femoral vein and including the common femoral, femoral, profunda femoral, popliteal and calf veins including the posterior tibial, peroneal and gastrocnemius veins when visible. The superficial great saphenous vein was also interrogated. Spectral Doppler was utilized to evaluate  flow at rest and with distal augmentation maneuvers in the common femoral, femoral and popliteal veins. COMPARISON:  None. FINDINGS: RIGHT LOWER EXTREMITY Common Femoral Vein: No evidence of thrombus. Normal compressibility, respiratory phasicity and response to augmentation. Saphenofemoral Junction: No evidence of thrombus. Normal compressibility and flow on color Doppler imaging. Profunda Femoral Vein: No evidence of thrombus. Normal compressibility and flow on color Doppler imaging. Femoral Vein: No evidence of thrombus. Normal  compressibility, respiratory phasicity and response to augmentation. Popliteal Vein: No evidence of thrombus. Normal compressibility, respiratory phasicity and response to augmentation. Calf Veins: No evidence of thrombus. Normal compressibility and flow on color Doppler imaging. Superficial Great Saphenous Vein: No evidence of thrombus. Normal compressibility. Venous Reflux:  None. Other Findings:  Subcutaneous edema in the calf. LEFT LOWER EXTREMITY Common Femoral Vein: No evidence of thrombus. Normal compressibility, respiratory phasicity and response to augmentation. Saphenofemoral Junction: No evidence of thrombus. Normal compressibility and flow on color Doppler imaging. Profunda Femoral Vein: No evidence of thrombus. Normal compressibility and flow on color Doppler imaging. Femoral Vein: No evidence of thrombus. Normal compressibility, respiratory phasicity and response to augmentation. Popliteal Vein: No evidence of thrombus. Normal compressibility, respiratory phasicity and response to augmentation. Calf Veins: No evidence of thrombus. Normal compressibility and flow on color Doppler imaging. Superficial Great Saphenous Vein: No evidence of thrombus. Normal compressibility. Venous Reflux:  None. Other Findings: Superficial thrombus in the superficial saphenous vein. Subcutaneous edema in the calf. IMPRESSION: 1. No evidence of deep venous thrombosis in either lower extremity. 2. Superficial thrombus in the left superficial saphenous vein. 3. Bilateral lower extremity edema. Electronically Signed   By: Keith Rake M.D.   On: 02/23/2019 18:00   DG Chest Port 1 View  Result Date: 02/23/2019 CLINICAL DATA:  Chest pain, right lung cancer EXAM: PORTABLE CHEST 1 VIEW COMPARISON:  08/07/2018 chest radiograph. FINDINGS: Left internal jugular Port-A-Cath terminates at cavoatrial junction. Stable cardiomediastinal silhouette with normal heart size. No pneumothorax. No pleural effusion. Mild reticular and hazy  right parahilar opacity is similar. No acute consolidative airspace disease. IMPRESSION: No acute cardiopulmonary disease. Hazy and reticular right parahilar opacity, similar, compatible with known tumor/post treatment change. Electronically Signed   By: Ilona Sorrel M.D.   On: 02/23/2019 16:37    Microbiology: Recent Results (from the past 240 hour(s))  SARS CORONAVIRUS 2 (TAT 6-24 HRS) Nasopharyngeal Nasopharyngeal Swab     Status: None   Collection Time: 03/20/19 11:07 PM   Specimen: Nasopharyngeal Swab  Result Value Ref Range Status   SARS Coronavirus 2 NEGATIVE NEGATIVE Final    Comment: (NOTE) SARS-CoV-2 target nucleic acids are NOT DETECTED. The SARS-CoV-2 RNA is generally detectable in upper and lower respiratory specimens during the acute phase of infection. Negative results do not preclude SARS-CoV-2 infection, do not rule out co-infections with other pathogens, and should not be used as the sole basis for treatment or other patient management decisions. Negative results must be combined with clinical observations, patient history, and epidemiological information. The expected result is Negative. Fact Sheet for Patients: SugarRoll.be Fact Sheet for Healthcare Providers: https://www.woods-mathews.com/ This test is not yet approved or cleared by the [place] FDA and  has been authorized for detection and/or diagnosis of SARS-CoV-2 by FDA under an Emergency Use Authorization (EUA). This EUA will remain  in effect (meaning this test can be used) for the duration of the COVID-19 declaration under Section 56 4(b)(1) of the Act, 21 U.S.C. section 360bbb-3(b)(1), unless the authorization is terminated or revoked sooner.  Performed at [facility], [place] [address], [place], [place] [address]      Labs: Basic Metabolic Panel: Recent Labs  Lab 03/20/19 2105 03/21/19 0440 03/21/19 0659 03/22/19 1016  NA 130* 140 141 138  K  4.6 4.1 3.9 4.6  CL 94* 106 107 106  CO2 22 25 25 25   GLUCOSE 493* 169* 142* 308*  BUN 50* 42* 40* 26*  CREATININE 2.15* 1.75* 1.61* 1.43*  CALCIUM 8.2* 8.0* 8.0* 8.3*   Liver Function Tests: No results for input(s): AST, ALT, ALKPHOS, BILITOT, PROT, ALBUMIN in the last 168 hours. No results for input(s): LIPASE, AMYLASE in the last 168 hours. No results for input(s): AMMONIA in the last 168 hours. CBC: Recent Labs  Lab 03/20/19 2105  WBC 9.0  HGB 11.9*  HCT 40.0  MCV 98.5  PLT 156   Cardiac Enzymes: No results for input(s): CKTOTAL, CKMB, CKMBINDEX, TROPONINI in the last 168 hours. BNP: BNP (last 3 results) Recent Labs    02/23/19 1644  BNP 55.6    ProBNP (last 3 results) No results for input(s): PROBNP in the last 8760 hours.  CBG: Recent Labs  Lab 03/21/19 1501 03/21/19 1717 03/21/19 2108 03/22/19 0817 03/22/19 1205  GLUCAP 259* 195* 194* 134* 261*       Signed:  Domenic Polite MD.  [facility] 03/23/2019, 1:58 PM

## 2019-03-30 ENCOUNTER — Other Ambulatory Visit: Payer: Self-pay

## 2019-03-30 ENCOUNTER — Emergency Department (HOSPITAL_BASED_OUTPATIENT_CLINIC_OR_DEPARTMENT_OTHER): Payer: BLUE CROSS/BLUE SHIELD

## 2019-03-30 ENCOUNTER — Inpatient Hospital Stay (HOSPITAL_BASED_OUTPATIENT_CLINIC_OR_DEPARTMENT_OTHER)
Admission: EM | Admit: 2019-03-30 | Discharge: 2019-04-02 | DRG: 871 | Disposition: A | Discharge status: Home / Self Care | Payer: BLUE CROSS/BLUE SHIELD | Attending: Internal Medicine | Admitting: Internal Medicine

## 2019-03-30 ENCOUNTER — Encounter (HOSPITAL_BASED_OUTPATIENT_CLINIC_OR_DEPARTMENT_OTHER): Payer: Self-pay

## 2019-03-30 DIAGNOSIS — R0602 Shortness of breath: Secondary | ICD-10-CM | POA: Diagnosis not present

## 2019-03-30 DIAGNOSIS — E1122 Type 2 diabetes mellitus with diabetic chronic kidney disease: Secondary | ICD-10-CM | POA: Diagnosis present

## 2019-03-30 DIAGNOSIS — I130 Hypertensive heart and chronic kidney disease with heart failure and stage 1 through stage 4 chronic kidney disease, or unspecified chronic kidney disease: Secondary | ICD-10-CM | POA: Diagnosis present

## 2019-03-30 DIAGNOSIS — Z79899 Other long term (current) drug therapy: Secondary | ICD-10-CM

## 2019-03-30 DIAGNOSIS — Z794 Long term (current) use of insulin: Secondary | ICD-10-CM

## 2019-03-30 DIAGNOSIS — J9601 Acute respiratory failure with hypoxia: Secondary | ICD-10-CM | POA: Diagnosis present

## 2019-03-30 DIAGNOSIS — C801 Malignant (primary) neoplasm, unspecified: Secondary | ICD-10-CM | POA: Diagnosis present

## 2019-03-30 DIAGNOSIS — E872 Acidosis: Secondary | ICD-10-CM | POA: Diagnosis present

## 2019-03-30 DIAGNOSIS — Z923 Personal history of irradiation: Secondary | ICD-10-CM

## 2019-03-30 DIAGNOSIS — Z79890 Hormone replacement therapy (postmenopausal): Secondary | ICD-10-CM

## 2019-03-30 DIAGNOSIS — C7801 Secondary malignant neoplasm of right lung: Secondary | ICD-10-CM | POA: Diagnosis present

## 2019-03-30 DIAGNOSIS — R140 Abdominal distension (gaseous): Secondary | ICD-10-CM

## 2019-03-30 DIAGNOSIS — D631 Anemia in chronic kidney disease: Secondary | ICD-10-CM | POA: Diagnosis present

## 2019-03-30 DIAGNOSIS — J440 Chronic obstructive pulmonary disease with acute lower respiratory infection: Secondary | ICD-10-CM | POA: Diagnosis present

## 2019-03-30 DIAGNOSIS — N179 Acute kidney failure, unspecified: Secondary | ICD-10-CM | POA: Diagnosis present

## 2019-03-30 DIAGNOSIS — J189 Pneumonia, unspecified organism: Secondary | ICD-10-CM

## 2019-03-30 DIAGNOSIS — I5032 Chronic diastolic (congestive) heart failure: Secondary | ICD-10-CM | POA: Diagnosis present

## 2019-03-30 DIAGNOSIS — Z9221 Personal history of antineoplastic chemotherapy: Secondary | ICD-10-CM

## 2019-03-30 DIAGNOSIS — E1142 Type 2 diabetes mellitus with diabetic polyneuropathy: Secondary | ICD-10-CM | POA: Diagnosis present

## 2019-03-30 DIAGNOSIS — A419 Sepsis, unspecified organism: Principal | ICD-10-CM

## 2019-03-30 DIAGNOSIS — E1165 Type 2 diabetes mellitus with hyperglycemia: Secondary | ICD-10-CM | POA: Diagnosis present

## 2019-03-30 DIAGNOSIS — N1831 Chronic kidney disease, stage 3a: Secondary | ICD-10-CM | POA: Diagnosis present

## 2019-03-30 DIAGNOSIS — Z87891 Personal history of nicotine dependence: Secondary | ICD-10-CM

## 2019-03-30 DIAGNOSIS — Z23 Encounter for immunization: Secondary | ICD-10-CM

## 2019-03-30 DIAGNOSIS — Z833 Family history of diabetes mellitus: Secondary | ICD-10-CM

## 2019-03-30 DIAGNOSIS — Z20822 Contact with and (suspected) exposure to covid-19: Secondary | ICD-10-CM | POA: Diagnosis present

## 2019-03-30 LAB — CBC WITH DIFFERENTIAL/PLATELET
Abs Immature Granulocytes: 0.13 10*3/uL — ABNORMAL HIGH (ref 0.00–0.07)
Basophils Absolute: 0 10*3/uL (ref 0.0–0.1)
Basophils Relative: 0 %
Eosinophils Absolute: 0 10*3/uL (ref 0.0–0.5)
Eosinophils Relative: 1 %
HCT: 29.4 % — ABNORMAL LOW (ref 39.0–52.0)
Hemoglobin: 9.4 g/dL — ABNORMAL LOW (ref 13.0–17.0)
Immature Granulocytes: 2 %
Lymphocytes Relative: 23 %
Lymphs Abs: 1.9 10*3/uL (ref 0.7–4.0)
MCH: 29.7 pg (ref 26.0–34.0)
MCHC: 32 g/dL (ref 30.0–36.0)
MCV: 92.7 fL (ref 80.0–100.0)
Monocytes Absolute: 1.1 10*3/uL — ABNORMAL HIGH (ref 0.1–1.0)
Monocytes Relative: 13 %
Neutro Abs: 5.2 10*3/uL (ref 1.7–7.7)
Neutrophils Relative %: 61 %
Platelets: 202 10*3/uL (ref 150–400)
RBC: 3.17 MIL/uL — ABNORMAL LOW (ref 4.22–5.81)
RDW: 14.1 % (ref 11.5–15.5)
WBC: 8.3 10*3/uL (ref 4.0–10.5)
nRBC: 0 % (ref 0.0–0.2)

## 2019-03-30 LAB — COMPREHENSIVE METABOLIC PANEL
ALT: 58 U/L — ABNORMAL HIGH (ref 0–44)
AST: 21 U/L (ref 15–41)
Albumin: 3.2 g/dL — ABNORMAL LOW (ref 3.5–5.0)
Alkaline Phosphatase: 80 U/L (ref 38–126)
Anion gap: 12 (ref 5–15)
BUN: 23 mg/dL (ref 8–23)
CO2: 23 mmol/L (ref 22–32)
Calcium: 8.4 mg/dL — ABNORMAL LOW (ref 8.9–10.3)
Chloride: 102 mmol/L (ref 98–111)
Creatinine, Ser: 1.61 mg/dL — ABNORMAL HIGH (ref 0.61–1.24)
GFR calc Af Amer: 53 mL/min — ABNORMAL LOW (ref >60)
GFR calc non Af Amer: 45 mL/min — ABNORMAL LOW (ref >60)
Glucose, Bld: 282 mg/dL — ABNORMAL HIGH (ref 70–99)
Potassium: 3.8 mmol/L (ref 3.5–5.1)
Sodium: 137 mmol/L (ref 135–145)
Total Bilirubin: 0.5 mg/dL (ref 0.3–1.2)
Total Protein: 6.4 g/dL — ABNORMAL LOW (ref 6.5–8.1)

## 2019-03-30 LAB — CBG MONITORING, ED: Glucose-Capillary: 255 mg/dL — ABNORMAL HIGH (ref 70–99)

## 2019-03-30 LAB — LACTIC ACID, PLASMA: Lactic Acid, Venous: 2.7 mmol/L (ref 0.5–1.9)

## 2019-03-30 LAB — BRAIN NATRIURETIC PEPTIDE: B Natriuretic Peptide: 59.7 pg/mL (ref 0.0–100.0)

## 2019-03-30 LAB — TROPONIN I (HIGH SENSITIVITY): Troponin I (High Sensitivity): 7 ng/L (ref <18)

## 2019-03-30 LAB — SARS CORONAVIRUS 2 AG (30 MIN TAT): SARS Coronavirus 2 Ag: NEGATIVE

## 2019-03-30 LAB — LIPASE, BLOOD: Lipase: 33 U/L (ref 11–51)

## 2019-03-30 MED ORDER — ACETAMINOPHEN 650 MG RE SUPP
RECTAL | Status: AC
  Filled 2019-03-30: qty 1

## 2019-03-30 MED ORDER — SODIUM CHLORIDE 0.9 % IV BOLUS
500.0000 mL | Freq: Once | INTRAVENOUS | Status: AC
  Administered 2019-03-30: 500 mL via INTRAVENOUS

## 2019-03-30 MED ORDER — MORPHINE SULFATE (PF) 4 MG/ML IV SOLN
INTRAVENOUS | Status: AC
  Filled 2019-03-30: qty 1

## 2019-03-30 MED ORDER — MORPHINE SULFATE (PF) 4 MG/ML IV SOLN
4.0000 mg | Freq: Once | INTRAVENOUS | Status: AC
  Administered 2019-03-30: 4 mg via INTRAVENOUS

## 2019-03-30 MED ORDER — IOHEXOL 350 MG/ML SOLN
80.0000 mL | Freq: Once | INTRAVENOUS | Status: AC | PRN
  Administered 2019-03-30: 100 mL via INTRAVENOUS

## 2019-03-30 MED ORDER — ACETAMINOPHEN 650 MG RE SUPP
650.0000 mg | Freq: Once | RECTAL | Status: AC
  Administered 2019-03-30: 650 mg via RECTAL

## 2019-03-30 NOTE — ED Notes (Signed)
Pt port accessed using sterile technique. Blood return noted. Pt tolerated well.

## 2019-03-30 NOTE — ED Notes (Signed)
md made aware of  Pt

## 2019-03-30 NOTE — ED Notes (Signed)
ED Provider at bedside. 

## 2019-03-30 NOTE — ED Notes (Signed)
Patient transported to CT 

## 2019-03-30 NOTE — ED Triage Notes (Addendum)
Pt c/o SOB x today-cough, chest tightness x 2 days-to triage in w/c-resp distress noted-to tx room via w/c

## 2019-03-31 DIAGNOSIS — C3490 Malignant neoplasm of unspecified part of unspecified bronchus or lung: Secondary | ICD-10-CM | POA: Diagnosis not present

## 2019-03-31 DIAGNOSIS — E1165 Type 2 diabetes mellitus with hyperglycemia: Secondary | ICD-10-CM | POA: Diagnosis present

## 2019-03-31 DIAGNOSIS — D631 Anemia in chronic kidney disease: Secondary | ICD-10-CM | POA: Diagnosis present

## 2019-03-31 DIAGNOSIS — E1169 Type 2 diabetes mellitus with other specified complication: Secondary | ICD-10-CM | POA: Diagnosis not present

## 2019-03-31 DIAGNOSIS — Z79899 Other long term (current) drug therapy: Secondary | ICD-10-CM | POA: Diagnosis not present

## 2019-03-31 DIAGNOSIS — N179 Acute kidney failure, unspecified: Secondary | ICD-10-CM | POA: Diagnosis present

## 2019-03-31 DIAGNOSIS — I130 Hypertensive heart and chronic kidney disease with heart failure and stage 1 through stage 4 chronic kidney disease, or unspecified chronic kidney disease: Secondary | ICD-10-CM | POA: Diagnosis present

## 2019-03-31 DIAGNOSIS — Z79890 Hormone replacement therapy (postmenopausal): Secondary | ICD-10-CM | POA: Diagnosis not present

## 2019-03-31 DIAGNOSIS — J9601 Acute respiratory failure with hypoxia: Secondary | ICD-10-CM | POA: Diagnosis present

## 2019-03-31 DIAGNOSIS — Z794 Long term (current) use of insulin: Secondary | ICD-10-CM | POA: Diagnosis not present

## 2019-03-31 DIAGNOSIS — N1831 Chronic kidney disease, stage 3a: Secondary | ICD-10-CM | POA: Diagnosis present

## 2019-03-31 DIAGNOSIS — I5032 Chronic diastolic (congestive) heart failure: Secondary | ICD-10-CM | POA: Diagnosis present

## 2019-03-31 DIAGNOSIS — R6520 Severe sepsis without septic shock: Secondary | ICD-10-CM | POA: Diagnosis not present

## 2019-03-31 DIAGNOSIS — R0602 Shortness of breath: Secondary | ICD-10-CM | POA: Diagnosis present

## 2019-03-31 DIAGNOSIS — J440 Chronic obstructive pulmonary disease with acute lower respiratory infection: Secondary | ICD-10-CM | POA: Diagnosis present

## 2019-03-31 DIAGNOSIS — C7801 Secondary malignant neoplasm of right lung: Secondary | ICD-10-CM | POA: Diagnosis present

## 2019-03-31 DIAGNOSIS — Z87891 Personal history of nicotine dependence: Secondary | ICD-10-CM | POA: Diagnosis not present

## 2019-03-31 DIAGNOSIS — Z833 Family history of diabetes mellitus: Secondary | ICD-10-CM | POA: Diagnosis not present

## 2019-03-31 DIAGNOSIS — E1122 Type 2 diabetes mellitus with diabetic chronic kidney disease: Secondary | ICD-10-CM | POA: Diagnosis present

## 2019-03-31 DIAGNOSIS — Z23 Encounter for immunization: Secondary | ICD-10-CM | POA: Diagnosis not present

## 2019-03-31 DIAGNOSIS — A419 Sepsis, unspecified organism: Principal | ICD-10-CM

## 2019-03-31 DIAGNOSIS — E872 Acidosis: Secondary | ICD-10-CM | POA: Diagnosis present

## 2019-03-31 DIAGNOSIS — J189 Pneumonia, unspecified organism: Secondary | ICD-10-CM | POA: Diagnosis present

## 2019-03-31 DIAGNOSIS — E1142 Type 2 diabetes mellitus with diabetic polyneuropathy: Secondary | ICD-10-CM | POA: Diagnosis present

## 2019-03-31 DIAGNOSIS — C801 Malignant (primary) neoplasm, unspecified: Secondary | ICD-10-CM | POA: Diagnosis present

## 2019-03-31 DIAGNOSIS — Z20822 Contact with and (suspected) exposure to covid-19: Secondary | ICD-10-CM | POA: Diagnosis present

## 2019-03-31 DIAGNOSIS — Z9221 Personal history of antineoplastic chemotherapy: Secondary | ICD-10-CM | POA: Diagnosis not present

## 2019-03-31 DIAGNOSIS — Z923 Personal history of irradiation: Secondary | ICD-10-CM | POA: Diagnosis not present

## 2019-03-31 LAB — URINALYSIS, MICROSCOPIC (REFLEX)
Bacteria, UA: NONE SEEN
WBC, UA: NONE SEEN WBC/hpf (ref 0–5)

## 2019-03-31 LAB — URINALYSIS, ROUTINE W REFLEX MICROSCOPIC
Bilirubin Urine: NEGATIVE
Glucose, UA: NEGATIVE mg/dL
Ketones, ur: NEGATIVE mg/dL
Leukocytes,Ua: NEGATIVE
Nitrite: NEGATIVE
Protein, ur: 30 mg/dL — AB
Specific Gravity, Urine: 1.02 (ref 1.005–1.030)
pH: 6 (ref 5.0–8.0)

## 2019-03-31 LAB — SARS CORONAVIRUS 2 (TAT 6-24 HRS): SARS Coronavirus 2: NEGATIVE

## 2019-03-31 LAB — TROPONIN I (HIGH SENSITIVITY): Troponin I (High Sensitivity): 6 ng/L (ref <18)

## 2019-03-31 LAB — CBG MONITORING, ED
Glucose-Capillary: 111 mg/dL — ABNORMAL HIGH (ref 70–99)
Glucose-Capillary: 168 mg/dL — ABNORMAL HIGH (ref 70–99)

## 2019-03-31 LAB — GLUCOSE, CAPILLARY: Glucose-Capillary: 236 mg/dL — ABNORMAL HIGH (ref 70–99)

## 2019-03-31 LAB — LACTIC ACID, PLASMA: Lactic Acid, Venous: 1.6 mmol/L (ref 0.5–1.9)

## 2019-03-31 MED ORDER — SODIUM CHLORIDE 0.9 % IV SOLN
1.0000 g | INTRAVENOUS | Status: DC
  Filled 2019-03-31: qty 10

## 2019-03-31 MED ORDER — INSULIN ASPART 100 UNIT/ML ~~LOC~~ SOLN
0.0000 [IU] | Freq: Three times a day (TID) | SUBCUTANEOUS | Status: DC
  Administered 2019-04-02: 5 [IU] via SUBCUTANEOUS

## 2019-03-31 MED ORDER — MORPHINE SULFATE (PF) 4 MG/ML IV SOLN
4.0000 mg | Freq: Once | INTRAVENOUS | Status: AC
  Administered 2019-03-31: 4 mg via INTRAVENOUS
  Filled 2019-03-31: qty 1

## 2019-03-31 MED ORDER — FAMOTIDINE 20 MG PO TABS
20.0000 mg | ORAL_TABLET | Freq: Two times a day (BID) | ORAL | Status: DC
  Administered 2019-03-31 – 2019-04-02 (×4): 20 mg via ORAL
  Filled 2019-03-31 (×4): qty 1

## 2019-03-31 MED ORDER — INSULIN ASPART 100 UNIT/ML ~~LOC~~ SOLN: 0.0000 [IU] | Freq: Three times a day (TID) | SUBCUTANEOUS | Status: DC

## 2019-03-31 MED ORDER — MORPHINE SULFATE 15 MG PO TABS
15.0000 mg | ORAL_TABLET | Freq: Three times a day (TID) | ORAL | Status: DC | PRN
  Administered 2019-04-01: 15 mg via ORAL
  Filled 2019-03-31: qty 1

## 2019-03-31 MED ORDER — INSULIN GLARGINE 100 UNIT/ML ~~LOC~~ SOLN
25.0000 [IU] | Freq: Every day | SUBCUTANEOUS | Status: DC
  Administered 2019-03-31 – 2019-04-01 (×2): 25 [IU] via SUBCUTANEOUS
  Filled 2019-03-31 (×3): qty 0.25

## 2019-03-31 MED ORDER — SODIUM CHLORIDE 0.9% FLUSH
3.0000 mL | Freq: Two times a day (BID) | INTRAVENOUS | Status: DC
  Administered 2019-04-01 – 2019-04-02 (×3): 3 mL via INTRAVENOUS

## 2019-03-31 MED ORDER — SENNOSIDES-DOCUSATE SODIUM 8.6-50 MG PO TABS: 1.0000 | ORAL_TABLET | Freq: Every evening | ORAL | Status: DC | PRN

## 2019-03-31 MED ORDER — ONDANSETRON HCL 4 MG PO TABS: 4.0000 mg | ORAL_TABLET | Freq: Four times a day (QID) | ORAL | Status: DC | PRN

## 2019-03-31 MED ORDER — GABAPENTIN 300 MG PO CAPS
300.0000 mg | ORAL_CAPSULE | Freq: Two times a day (BID) | ORAL | Status: DC
  Administered 2019-03-31 – 2019-04-02 (×5): 300 mg via ORAL
  Filled 2019-03-31 (×5): qty 1

## 2019-03-31 MED ORDER — SODIUM CHLORIDE 0.9 % IV SOLN
INTRAVENOUS | Status: DC | PRN
  Administered 2019-03-31: 1000 mL via INTRAVENOUS

## 2019-03-31 MED ORDER — ACETAMINOPHEN 650 MG RE SUPP: 650.0000 mg | Freq: Four times a day (QID) | RECTAL | Status: DC | PRN

## 2019-03-31 MED ORDER — LEVALBUTEROL HCL 0.63 MG/3ML IN NEBU
0.6300 mg | INHALATION_SOLUTION | Freq: Three times a day (TID) | RESPIRATORY_TRACT | Status: DC
  Administered 2019-04-01 – 2019-04-02 (×5): 0.63 mg via RESPIRATORY_TRACT
  Filled 2019-03-31 (×5): qty 3

## 2019-03-31 MED ORDER — AMITRIPTYLINE HCL 25 MG PO TABS
50.0000 mg | ORAL_TABLET | Freq: Every day | ORAL | Status: DC
  Administered 2019-03-31 – 2019-04-01 (×2): 50 mg via ORAL
  Filled 2019-03-31 (×2): qty 1
  Filled 2019-03-31 (×2): qty 2

## 2019-03-31 MED ORDER — DM-GUAIFENESIN ER 30-600 MG PO TB12
1.0000 | ORAL_TABLET | Freq: Two times a day (BID) | ORAL | Status: DC
  Administered 2019-03-31 – 2019-04-02 (×4): 1 via ORAL
  Filled 2019-03-31 (×4): qty 1

## 2019-03-31 MED ORDER — LEVALBUTEROL HCL 0.63 MG/3ML IN NEBU
0.6300 mg | INHALATION_SOLUTION | Freq: Four times a day (QID) | RESPIRATORY_TRACT | Status: DC
  Administered 2019-03-31: 0.63 mg via RESPIRATORY_TRACT
  Filled 2019-03-31: qty 3

## 2019-03-31 MED ORDER — SODIUM CHLORIDE 0.9 % IV SOLN
2.0000 g | Freq: Once | INTRAVENOUS | Status: AC
  Administered 2019-03-31: 2 g via INTRAVENOUS
  Filled 2019-03-31: qty 20

## 2019-03-31 MED ORDER — ACETAMINOPHEN 325 MG PO TABS: 650.0000 mg | ORAL_TABLET | Freq: Four times a day (QID) | ORAL | Status: DC | PRN

## 2019-03-31 MED ORDER — GLIMEPIRIDE 2 MG PO TABS
2.0000 mg | ORAL_TABLET | Freq: Every day | ORAL | Status: DC
  Administered 2019-03-31 – 2019-04-01 (×2): 2 mg via ORAL
  Filled 2019-03-31 (×3): qty 1

## 2019-03-31 MED ORDER — DEXTROSE 5 % IV SOLN
250.0000 mg | INTRAVENOUS | Status: DC
  Filled 2019-03-31: qty 250

## 2019-03-31 MED ORDER — AMLODIPINE BESYLATE 5 MG PO TABS
5.0000 mg | ORAL_TABLET | Freq: Every day | ORAL | Status: DC
  Administered 2019-03-31 – 2019-04-02 (×3): 5 mg via ORAL
  Filled 2019-03-31 (×3): qty 1

## 2019-03-31 MED ORDER — FLUTICASONE FUROATE-VILANTEROL 200-25 MCG/INH IN AEPB
1.0000 | INHALATION_SPRAY | Freq: Every day | RESPIRATORY_TRACT | Status: DC
  Administered 2019-03-31 – 2019-04-02 (×3): 1 via RESPIRATORY_TRACT
  Filled 2019-03-31 (×3): qty 28

## 2019-03-31 MED ORDER — PANTOPRAZOLE SODIUM 40 MG PO TBEC
40.0000 mg | DELAYED_RELEASE_TABLET | Freq: Every day | ORAL | Status: DC
  Administered 2019-03-31 – 2019-04-02 (×3): 40 mg via ORAL
  Filled 2019-03-31 (×3): qty 1

## 2019-03-31 MED ORDER — LEVALBUTEROL HCL 0.63 MG/3ML IN NEBU: 0.6300 mg | INHALATION_SOLUTION | RESPIRATORY_TRACT | Status: DC | PRN

## 2019-03-31 MED ORDER — SODIUM CHLORIDE 0.9 % IV SOLN
2.0000 g | Freq: Two times a day (BID) | INTRAVENOUS | Status: DC
  Administered 2019-03-31 – 2019-04-01 (×2): 2 g via INTRAVENOUS
  Filled 2019-03-31 (×4): qty 2

## 2019-03-31 MED ORDER — METOPROLOL SUCCINATE ER 50 MG PO TB24
50.0000 mg | ORAL_TABLET | Freq: Every day | ORAL | Status: DC
  Administered 2019-03-31 – 2019-04-02 (×3): 50 mg via ORAL
  Filled 2019-03-31: qty 1
  Filled 2019-03-31: qty 2
  Filled 2019-03-31: qty 1

## 2019-03-31 MED ORDER — ALBUTEROL SULFATE HFA 108 (90 BASE) MCG/ACT IN AERS
2.0000 | INHALATION_SPRAY | Freq: Four times a day (QID) | RESPIRATORY_TRACT | Status: DC | PRN
  Filled 2019-03-31: qty 6.7

## 2019-03-31 MED ORDER — SODIUM CHLORIDE 0.9 % IV SOLN
500.0000 mg | Freq: Once | INTRAVENOUS | Status: AC
  Administered 2019-03-31: 500 mg via INTRAVENOUS
  Filled 2019-03-31: qty 500

## 2019-03-31 MED ORDER — INSULIN ASPART 100 UNIT/ML FLEXPEN
0.0000 [IU] | PEN_INJECTOR | Freq: Three times a day (TID) | SUBCUTANEOUS | Status: DC | PRN
  Filled 2019-03-31: qty 3

## 2019-03-31 MED ORDER — ONDANSETRON HCL 4 MG/2ML IJ SOLN: 4.0000 mg | Freq: Four times a day (QID) | INTRAMUSCULAR | Status: DC | PRN

## 2019-03-31 MED ORDER — HEPARIN SODIUM (PORCINE) 5000 UNIT/ML IJ SOLN
5000.0000 [IU] | Freq: Three times a day (TID) | INTRAMUSCULAR | Status: DC
  Administered 2019-03-31 – 2019-04-02 (×5): 5000 [IU] via SUBCUTANEOUS
  Filled 2019-03-31 (×5): qty 1

## 2019-03-31 MED ORDER — ALBUTEROL SULFATE (2.5 MG/3ML) 0.083% IN NEBU: 2.5000 mg | INHALATION_SOLUTION | Freq: Four times a day (QID) | RESPIRATORY_TRACT | Status: DC | PRN

## 2019-03-31 NOTE — ED Notes (Signed)
Oxygen 2L via [place] placed for oxygen sat 91%

## 2019-03-31 NOTE — ED Notes (Signed)
ED TO INPATIENT HANDOFF REPORT  ED Nurse Name and Phone #: Marisa Hua RN [phone]  S Name/Age/Gender Travis [name] 64 y.o. male Room/Bed: MH04/MH04  Code Status   Code Status: Not on file  Home/SNF/Other Home Patient oriented to: self, place, time and situation Is this baseline? Yes   Triage Complete: Triage complete  Chief Complaint Acute respiratory failure with hypoxia ([place]) [J96.01]  Triage Note Pt c/o SOB x today-cough, chest tightness x 2 days-to triage in w/c-resp distress noted-to tx room via w/c    Allergies No Known Allergies  Level of Care/Admitting Diagnosis ED Disposition    ED Disposition Condition [place]: [place] [[id number]]  Level of Care: Telemetry [5]  Admit to tele based on following criteria: Monitor for Ischemic changes  Covid Evaluation: Symptomatic Person Under Investigation (PUI)  Diagnosis: Acute respiratory failure with hypoxia [facility]) [[id number]]  Admitting Physician: [name] [phone]  Attending Physician: [name] [email or web address]  Estimated length of stay: past midnight tomorrow  Certification:: I certify this patient will need inpatient services for at least 2 midnights       B Medical/Surgery History Past Medical History:  Diagnosis Date  . Arthritis   . Cancer ([place])    lung  . Chronic kidney disease   . Diabetes mellitus    TYPE II  . Dyspnea    at times  . Hypertension   . Pneumonia    "years ago"   Past Surgical History:  Procedure Laterality Date  . COLONOSCOPY W/ POLYPECTOMY    . IR IMAGING GUIDED PORT INSERTION  05/05/2018  . NASAL FRACTURE SURGERY    . VIDEO BRONCHOSCOPY WITH ENDOBRONCHIAL ULTRASOUND Right 04/20/2018   Procedure: VIDEO BRONCHOSCOPY WITH ENDOBRONCHIAL ULTRASOUND;  Surgeon: Juanito Doom, MD;  Location: [place] OR;  Service: Thoracic;  Laterality: Right;     A IV Location/Drains/Wounds Patient Lines/Drains/Airways Status   Active  Line/Drains/Airways    Name:   Placement date:   Placement time:   Site:   Days:   Implanted Port 05/05/18 Left Chest   05/05/18    1508    Chest   330   Implanted Port Left Chest   --    --    Chest      Peripheral IV 03/30/19 Right Forearm   03/30/19    2301    Forearm   1   Incision (Closed) 04/20/18 N/A Other (Comment)   04/20/18    1256     345          Intake/Output Last 24 hours  Intake/Output Summary (Last 24 hours) at 03/31/2019 1625 Last data filed at 03/31/2019 1458 Gross per 24 hour  Intake 500 ml  Output 450 ml  Net 50 ml    Labs/Imaging Results for orders placed or performed during the hospital encounter of 03/30/19 (from the past 48 hour(s))  SARS Coronavirus 2 Ag (30 min TAT) - Nasal Swab (BD Veritor Kit)     Status: None   Collection Time: 03/30/19 10:40 PM   Specimen: Nasal Swab (BD Veritor Kit)  Result Value Ref Range   SARS Coronavirus 2 Ag NEGATIVE NEGATIVE    Comment: (NOTE) SARS-CoV-2 antigen NOT DETECTED.  Negative results are presumptive.  Negative results do not preclude SARS-CoV-2 infection and should not be used as the sole basis for treatment or other patient management decisions, including infection  control decisions, particularly in the presence of clinical signs and  symptoms consistent with COVID-19, or in those who have been in contact with the virus.  Negative results must be combined with clinical observations, patient history, and epidemiological information. The expected result is Negative. Fact Sheet for Patients: PodPark.tn Fact Sheet for Healthcare Providers: GiftContent.is This test is not yet approved or cleared by the [place] FDA and  has been authorized for detection and/or diagnosis of SARS-CoV-2 by FDA under an Emergency Use Authorization (EUA).  This EUA will remain in effect (meaning this test can be used) for the duration of  the COVID-19 de claration under  Section 564(b)(1) of the Act, 21 U.S.C. section 360bbb-3(b)(1), unless the authorization is terminated or revoked sooner. Performed at [facility], [address] [place], [place] [address]   POC CBG, ED     Status: Abnormal   Collection Time: 03/30/19 10:47 PM  Result Value Ref Range   Glucose-Capillary 255 (H) 70 - 99 mg/dL   Comment 1 Notify RN    Comment 2 Document in Chart   CBC with Differential     Status: Abnormal   Collection Time: 03/30/19 10:51 PM  Result Value Ref Range   WBC 8.3 4.0 - 10.5 K/uL   RBC 3.17 (L) 4.22 - 5.81 MIL/uL   Hemoglobin 9.4 (L) 13.0 - 17.0 g/dL   HCT 29.4 (L) 39.0 - 52.0 %   MCV 92.7 80.0 - 100.0 fL   MCH 29.7 26.0 - 34.0 pg   MCHC 32.0 30.0 - 36.0 g/dL   RDW 14.1 11.5 - 15.5 %   Platelets 202 150 - 400 K/uL   nRBC 0.0 0.0 - 0.2 %   Neutrophils Relative % 61 %   Neutro Abs 5.2 1.7 - 7.7 K/uL   Lymphocytes Relative 23 %   Lymphs Abs 1.9 0.7 - 4.0 K/uL   Monocytes Relative 13 %   Monocytes Absolute 1.1 (H) 0.1 - 1.0 K/uL   Eosinophils Relative 1 %   Eosinophils Absolute 0.0 0.0 - 0.5 K/uL   Basophils Relative 0 %   Basophils Absolute 0.0 0.0 - 0.1 K/uL   Immature Granulocytes 2 %   Abs Immature Granulocytes 0.13 (H) 0.00 - 0.07 K/uL    Comment: Performed at [facility], [address] [place], [place] [address]  Comprehensive metabolic panel     Status: Abnormal   Collection Time: 03/30/19 10:51 PM  Result Value Ref Range   Sodium 137 135 - 145 mmol/L   Potassium 3.8 3.5 - 5.1 mmol/L   Chloride 102 98 - 111 mmol/L   CO2 23 22 - 32 mmol/L   Glucose, Bld 282 (H) 70 - 99 mg/dL   BUN 23 8 - 23 mg/dL   Creatinine, Ser 1.61 (H) 0.61 - 1.24 mg/dL   Calcium 8.4 (L) 8.9 - 10.3 mg/dL   Total Protein 6.4 (L) 6.5 - 8.1 g/dL   Albumin 3.2 (L) 3.5 - 5.0 g/dL   AST 21 15 - 41 U/L   ALT 58 (H) 0 - 44 U/L   Alkaline Phosphatase 80 38 - 126 U/L   Total Bilirubin 0.5 0.3 - 1.2 mg/dL   GFR calc non Af Amer 45 (L) >60 mL/min    GFR calc Af Amer 53 (L) >60 mL/min   Anion gap 12 5 - 15    Comment: Performed at [facility], [address] [place], [place] [address]  Lactic acid, plasma     Status: Abnormal  Collection Time: 03/30/19 10:51 PM  Result Value Ref Range   Lactic Acid, Venous 2.7 (HH) 0.5 - 1.9 mmol/L    Comment: CRITICAL RESULT CALLED TO, READ BACK BY AND VERIFIED WITH: NEAL,K AT 2319 ON [id number] BY CHERESNOWSKY,T Performed at [facility], [address] [place], [place] [address]   Troponin I (High Sensitivity)     Status: None   Collection Time: 03/30/19 10:51 PM  Result Value Ref Range   Troponin I (High Sensitivity) 7 <18 ng/L    Comment: (NOTE) Elevated high sensitivity troponin I (hsTnI) values and significant  changes across serial measurements may suggest ACS but many other  chronic and acute conditions are known to elevate hsTnI results.  Refer to the "Links" section for chest pain algorithms and additional  guidance. Performed at [facility], [address] [place], [place] [address]   Brain natriuretic peptide     Status: None   Collection Time: 03/30/19 10:51 PM  Result Value Ref Range   B Natriuretic Peptide 59.7 0.0 - 100.0 pg/mL    Comment: Performed at [facility], [address] [place], [place] [address]  Lipase     Status: None   Collection Time: 03/30/19 10:51 PM  Result Value Ref Range   Lipase 33 11 - 51 U/L    Comment: Performed at [facility], [address] [place], [place] [address]  Troponin I (High Sensitivity)     Status: None   Collection Time: 03/31/19 12:49 AM  Result Value Ref Range   Troponin I (High Sensitivity) 6 <18 ng/L    Comment: (NOTE) Elevated high sensitivity troponin I (hsTnI) values and significant  changes across serial measurements may suggest ACS but many other  chronic and acute conditions are known to elevate hsTnI results.  Refer to the "Links" section for chest pain  algorithms and additional  guidance. Performed at [facility], [address] [place], [place] [address]   Lactic acid, plasma     Status: None   Collection Time: 03/31/19  1:00 AM  Result Value Ref Range   Lactic Acid, Venous 1.6 0.5 - 1.9 mmol/L    Comment: Performed at [facility], [address] [place], [place] [address]  SARS CORONAVIRUS 2 (TAT 6-24 HRS) Nasopharyngeal Nasopharyngeal Swab     Status: None   Collection Time: 03/31/19  1:24 AM   Specimen: Nasopharyngeal Swab  Result Value Ref Range   SARS Coronavirus 2 NEGATIVE NEGATIVE    Comment: (NOTE) SARS-CoV-2 target nucleic acids are NOT DETECTED. The SARS-CoV-2 RNA is generally detectable in upper and lower respiratory specimens during the acute phase of infection. Negative results do not preclude SARS-CoV-2 infection, do not rule out co-infections with other pathogens, and should not be used as the sole basis for treatment or other patient management decisions. Negative results must be combined with clinical observations, patient history, and epidemiological information. The expected result is Negative. Fact Sheet for Patients: SugarRoll.be Fact Sheet for Healthcare Providers: https://www.woods-mathews.com/ This test is not yet approved or cleared by the [place] FDA and  has been authorized for detection and/or diagnosis of SARS-CoV-2 by FDA under an Emergency Use Authorization (EUA). This EUA will remain  in effect (meaning this test can be used) for the duration of the COVID-19 declaration under Section 56 4(b)(1) of the Act, 21 U.S.C. section 360bbb-3(b)(1), unless the authorization is terminated or revoked  sooner. Performed at [facility], [address], [place], [place] [address]   Urinalysis, Routine w reflex microscopic     Status: Abnormal   Collection Time: 03/31/19  8:07 AM  Result Value Ref Range   Color, Urine YELLOW  YELLOW   APPearance CLEAR CLEAR   Specific Gravity, Urine 1.020 1.005 - 1.030   pH 6.0 5.0 - 8.0   Glucose, UA NEGATIVE NEGATIVE mg/dL   Hgb urine dipstick TRACE (A) NEGATIVE   Bilirubin Urine NEGATIVE NEGATIVE   Ketones, ur NEGATIVE NEGATIVE mg/dL   Protein, ur 30 (A) NEGATIVE mg/dL   Nitrite NEGATIVE NEGATIVE   Leukocytes,Ua NEGATIVE NEGATIVE    Comment: Performed at [facility], [address] [place], [place] [address]  Urinalysis, Microscopic (reflex)     Status: None   Collection Time: 03/31/19  8:07 AM  Result Value Ref Range   RBC / HPF 0-5 0 - 5 RBC/hpf   WBC, UA NONE SEEN 0 - 5 WBC/hpf   Bacteria, UA NONE SEEN NONE SEEN   Squamous Epithelial / LPF 0-5 0 - 5    Comment: Performed at [facility], [address] [place], [place] [address]  CBG monitoring, ED     Status: Abnormal   Collection Time: 03/31/19  9:15 AM  Result Value Ref Range   Glucose-Capillary 111 (H) 70 - 99 mg/dL   Comment 1 Notify RN    Comment 2 Document in Chart   POC CBG, ED     Status: Abnormal   Collection Time: 03/31/19  3:58 PM  Result Value Ref Range   Glucose-Capillary 168 (H) 70 - 99 mg/dL   CT Angio Chest PE W and/or Wo Contrast  Result Date: 03/31/2019 CLINICAL DATA:  Chest pain and tightness, history of lung malignancy EXAM: CT ANGIOGRAPHY CHEST WITH CONTRAST TECHNIQUE: Multidetector CT imaging of the chest was performed using the standard protocol during bolus administration of intravenous contrast. Multiplanar CT image reconstructions and MIPs were obtained to evaluate the vascular anatomy. CONTRAST:  196m OMNIPAQUE IOHEXOL 350 MG/ML SOLN COMPARISON:  Chest radiograph 03/30/2019, CTA 02/23/2019 FINDINGS: Cardiovascular: Satisfactory opacification the pulmonary arteries to the segmental level. No pulmonary artery filling defects are identified. Central pulmonary arteries are normal caliber. Normal heart size. No pericardial effusion. Few scattered coronary artery  calcifications are present. Atherosclerotic plaque within the normal caliber aorta. Normal 3 vessel branching of the arch. Proximal great vessels are mildly calcified without acute abnormality. There is an accessed left IJ approach Port-A-Cath with the tip positioned at the level of the superior cavoatrial junction. Mediastinum/Nodes: Stable size of a 9 mm AP window lymph node no new mediastinal, hilar or axillary adenopathy. Thyroid gland and thoracic inlet are unremarkable. Bowing of the trachea is a common finding with imaging during exhalation for the CT PA technique. Esophagus is unremarkable. Lungs/Pleura: Extensive cicatricial change in architectural distortion throughout both lungs but most pronounced within the right apex and superior segment right lower lobe. Stable appearance of the masslike opacity along the major fissure extending from the right hilum measuring approximately 14 by 21 mm in size (5/42) not significantly changed from 22 x 15 mm on comparison study. There are however increasingly coalescent opacities within the regions of architectural distortion predominantly in the superior segment left lower lobe. Several truncated airways and mucous plugs are noted within this region. Stable 4 mm nodule in the left upper lobe anteriorly (5/40). Upper Abdomen: No acute abnormalities present  in the visualized portions of the upper abdomen. Musculoskeletal: Multilevel degenerative changes are present in the imaged portions of the spine. Multilevel flowing anterior osteophytosis, compatible with features of diffuse idiopathic skeletal hyperostosis (DISH). No acute osseous abnormality or suspicious osseous lesion. Pseudoarticulations at the coracoclavicular intervals bilaterally are similar to prior. Review of the MIP images confirms the above findings. IMPRESSION: 1. No evidence for pulmonary embolus. 2. Stable appearance of the masslike opacity along the right major fissure extending from the right hilum.  3. Increasingly coalescent opacities in the superior segment left lower lobe, could reflect inflammation/radiation pneumonitis, possible infection with associated mucous plugging and thickened airways, or progression of disease with lymphovascular spread. Recommend correlation patient's clinical status and if further evaluation is warranted, multidisciplinary clinic referral is recommended. 4. Stable 4 mm left upper lobe nodule. 5.  Aortic Atherosclerosis (ICD10-I70.0). Electronically Signed   By: Lovena Le M.D.   On: 03/31/2019 00:05   DG Chest Port 1 View  Result Date: 03/30/2019 CLINICAL DATA:  Shortness of breath and cough. EXAM: PORTABLE CHEST 1 VIEW COMPARISON:  February 23, 2019 FINDINGS: There is stable left-sided venous Port-A-Cath positioning. Mild, stable right parahilar opacity is seen. There is no evidence of a pleural effusion or pneumothorax. The heart size and mediastinal contours are within normal limits. Degenerative changes seen throughout the thoracic spine. IMPRESSION: 1. Mild stable right parahilar opacity which may represent sequelae associated with known for tumor/post treatment changes. Electronically Signed   By: Virgina Norfolk M.D.   On: 03/30/2019 23:19    Pending Labs Unresulted Labs (From admission, onward)    Start     Ordered   03/31/19 0046  Urine culture  ONCE - STAT,   STAT     03/31/19 0046   03/30/19 2306  Blood culture (routine x 2)  BLOOD CULTURE X 2,   STAT    Question:  Patient immune status  Answer:  Normal   03/30/19 2306          Vitals/Pain Today's Vitals   03/31/19 1400 03/31/19 1430 03/31/19 1533 03/31/19 1616  BP: 131/80 (!) 145/85 (!) 149/84   Pulse: 92 95 94 (!) 102  Resp:   19 19  Temp:    98.6 F (37 C)  TempSrc:    Oral  SpO2: 100% 97% 100% 100%  Weight:      Height:      PainSc:        Isolation Precautions No active isolations  Medications Medications  0.9 %  sodium chloride infusion (1,000 mLs Intravenous New  Bag/Given 03/31/19 0113)  albuterol (VENTOLIN HFA) 108 (90 Base) MCG/ACT inhaler 2 puff (has no administration in time range)  amitriptyline (ELAVIL) tablet 50 mg (has no administration in time range)  amLODipine (NORVASC) tablet 5 mg (5 mg Oral Given 03/31/19 0918)  fluticasone furoate-vilanterol (BREO ELLIPTA) 200-25 MCG/INH 1 puff (1 puff Inhalation Given 03/31/19 1311)  glimepiride (AMARYL) tablet 2 mg (2 mg Oral Given 03/31/19 1304)  gabapentin (NEURONTIN) capsule 300 mg (300 mg Oral Given 03/31/19 0918)  insulin glargine (LANTUS) injection 25 Units (has no administration in time range)  insulin aspart (NOVOLOG) FlexPen 0-16 Units (has no administration in time range)  metoprolol succinate (TOPROL-XL) 24 hr tablet 50 mg (50 mg Oral Given 03/31/19 0918)  pantoprazole (PROTONIX) EC tablet 40 mg (40 mg Oral Given 03/31/19 0918)  cefTRIAXone (ROCEPHIN) 1 g in sodium chloride 0.9 % 100 mL IVPB (has no administration in time range)  azithromycin (ZITHROMAX)  250 mg in dextrose 5 % 125 mL IVPB (has no administration in time range)  acetaminophen (TYLENOL) suppository 650 mg (650 mg Rectal Given 03/30/19 2310)  morphine 4 MG/ML injection 4 mg (4 mg Intravenous Given 03/30/19 2341)  sodium chloride 0.9 % bolus 500 mL (0 mLs Intravenous Stopped 03/31/19 0130)  iohexol (OMNIPAQUE) 350 MG/ML injection 80 mL (100 mLs Intravenous Contrast Given 03/30/19 2346)  cefTRIAXone (ROCEPHIN) 2 g in sodium chloride 0.9 % 100 mL IVPB (0 g Intravenous Stopped 03/31/19 0147)  azithromycin (ZITHROMAX) 500 mg in sodium chloride 0.9 % 250 mL IVPB (0 mg Intravenous Stopped 03/31/19 0256)  morphine 4 MG/ML injection 4 mg (4 mg Intravenous Given 03/31/19 0418)  morphine 4 MG/ML injection 4 mg (4 mg Intravenous Given 03/31/19 0917)    Mobility walks Low fall risk   Focused Assessments none   R Recommendations: See Admitting Provider Note  Report given to:   Additional Notes:

## 2019-03-31 NOTE — Progress Notes (Addendum)
VAST consulted d/t implanted port dressing not having date on it. Pt reported that left chest port was accessed today at [place] unit and spoke with pt's nurse, Tiffany. She stated she would look at site and place date on dressing if Biopatch in place and dsg intact. VAST RN advised if anything else needed to place new IVT consult. Tiffany, RN verbalized understanding.   Updated information: VAST RN found note from [facility] ER nurse dated 03/30/19 at 2250 stating she accessed port using sterile technique with good blood return. Sent Secure Chat to Tiffany, RN relaying this information; advised that dressing date should be 03/30/19 and huber port needle will be due for change 04/06/19. Tiffany, RN returned [employer] that she received the information.

## 2019-03-31 NOTE — ED Notes (Signed)
ED Provider at bedside. 

## 2019-03-31 NOTE — H&P (Signed)
History and Physical  Travis [name] TGY:[id number] DOB: 1955/10/01 DOA: 03/30/2019   Patient coming from: Home & is able to ambulate  Chief Complaint: Shortness of breath  HPI: Travis [name] is a 64 y.o. 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  Patient reported worsening symptoms especially with dyspnea, nonproductive cough with epigastric pain only during coughing.  Denies any other kind of chest pain, denies any fever/chills or any sick contacts or Covid exposure.  Patient denies any nausea/vomiting, abdominal pain, diarrhea, any recent weight loss.  Of note, patient is followed at [facility] for his lung cancer, completed therapy in April 2020, and has a follow-up on 04/08/2019.   ED Course: Patient noted to be febrile, tachycardic, with increased work of breathing, desaturated with exertion and subsequently needed about 2 L of nasal cannula, labs showed creatinine of 1.61 lactic acid of 2.7, chest x-ray unremarkable, CTA chest showed evidence of left lower lobe inflammation concerning for infectious versus progressing neoplastic process.  Code sepsis was initiated due to the elevated lactic acid, patient received IV fluids and started on IV ceftriaxone/azithromycin.  Patient admitted for further management   Review of Systems: Review of systems are otherwise negative   Past Medical History:  Diagnosis Date  . Arthritis   . Cancer ([place])    lung  . Chronic kidney disease   . Diabetes mellitus    TYPE II  . Dyspnea    at times  . Hypertension   . Pneumonia    "years ago"   Past Surgical History:  Procedure Laterality Date  . COLONOSCOPY W/ POLYPECTOMY    . IR IMAGING GUIDED PORT INSERTION   05/05/2018  . NASAL FRACTURE SURGERY    . VIDEO BRONCHOSCOPY WITH ENDOBRONCHIAL ULTRASOUND Right 04/20/2018   Procedure: VIDEO BRONCHOSCOPY WITH ENDOBRONCHIAL ULTRASOUND;  Surgeon: Juanito Doom, MD;  Location: [place] [facility] [place] [facility];  Service: Thoracic;  Laterality: Right;    Social History:  reports that he quit smoking about a year ago. His smoking use included cigarettes. He has a 45.00 pack-year smoking history. He quit smokeless tobacco use about 46 years ago.  His smokeless tobacco use included chew. He reports previous alcohol use. He reports that he does not use drugs.   No Known Allergies  Family History  Problem Relation Age of Onset  . Leukemia Sister   . Cancer Brother   . Diabetes Mother       Prior to Admission medications   Medication Sig Start Date End Date Taking? Authorizing Provider  albuterol (VENTOLIN HFA) 108 (90 Base) MCG/ACT inhaler Inhale 2 puffs into the lungs every 6 (six) hours as needed for wheezing or shortness of breath. 07/09/18  Yes Tish Men, MD  amitriptyline (ELAVIL) 50 MG tablet Take 50 mg by mouth at bedtime.   Yes [provider]  amLODipine (NORVASC) 5 MG tablet Take 5 mg by mouth daily. 04/10/18  Yes [provider]  budesonide-formoterol (SYMBICORT) 160-4.5 MCG/ACT inhaler Inhale 2 puffs into the lungs 2 (two) times daily. 07/09/18  Yes Tish Men, MD  famotidine (PEPCID) 20 MG tablet Take 20 mg by mouth 2 (two) times daily. 03/11/19  Yes [provider]  furosemide (LASIX) 40 MG tablet Take 40 mg by mouth 2 (two) times  daily. 03/25/19  Yes [provider]  gabapentin (NEURONTIN) 300 MG capsule Take 2 capsules by mouth twice daily Patient taking differently: Take 300 mg by mouth 2 (two) times daily.  12/25/18  Yes Tish Men, MD  glimepiride (AMARYL) 2 MG tablet Take 2 mg by mouth daily with breakfast.  08/01/18  Yes [provider]  insulin aspart (NOVOLOG) 100 UNIT/ML FlexPen Inject 6-18 Units into the skin See admin  instructions. BS 180-200 add extra 2 units BS 201-250 add 5 units BS 251-300 add 7 units 301-350 add 10 units 351-400 add 12 units  bs >400 call PCP 03/25/19  Yes [provider]  Insulin Glargine (LANTUS) 100 UNIT/ML Solostar Pen Inject 25 Units into the skin at bedtime. 03/22/19 04/21/19 Yes Domenic Polite, MD  lidocaine (XYLOCAINE) 2 % solution Patient: Mix 1part 2% viscous lidocaine, 1part H20. Swish & swallow 24mL of diluted mixture, 15min before meals and at bedtime, up to QID Patient taking differently: Use as directed 15 mLs in the mouth or throat 4 (four) times daily as needed for mouth pain. Patient: Mix 1part 2% viscous lidocaine, 1part H20. Swish & swallow 32mL of diluted mixture, 87min before meals and at bedtime, up to QID 05/29/18  Yes Tish Men, MD  magnesium oxide (MAG-OX) 400 (241.3 Mg) MG tablet Take 1 tablet by mouth 2 (two) times daily. 03/15/19  Yes [provider]  metoprolol succinate (TOPROL-XL) 50 MG 24 hr tablet Take 50 mg by mouth daily. 03/13/19  Yes [provider]  morphine (MSIR) 15 MG tablet Take 15 mg by mouth every 8 (eight) hours as needed for pain. 01/22/19  Yes [provider]  pantoprazole (PROTONIX) 40 MG tablet Take 40 mg by mouth daily. 03/05/19  Yes [provider]  potassium chloride (KLOR-CON) 10 MEQ tablet Take 10 mEq by mouth daily. 03/11/19  Yes [provider]  torsemide (DEMADEX) 20 MG tablet Take 1 tablet (20 mg total) by mouth 2 (two) times daily. 03/24/19  Yes Domenic Polite, MD  traMADol (ULTRAM) 50 MG tablet Take 1 tablet (50 mg total) by mouth every 8 (eight) hours as needed for moderate pain. 09/04/18  Yes Tish Men, MD  TRELEGY ELLIPTA 100-62.5-25 MCG/INH AEPB Inhale 1 puff into the lungs daily. 02/26/19  Yes [provider]  glucose blood test strip 4x a day 07/01/18   Shamleffer, Melanie Crazier, MD  Insulin Pen Needle (BD PEN NEEDLE NANO U/F) 32G X 4 MM MISC 4x daily 07/01/18    Shamleffer, Melanie Crazier, MD  metFORMIN (GLUCOPHAGE-XR) 500 MG 24 hr tablet Take 2 tablets (1,000 mg total) by mouth 2 (two) times daily. Patient not taking: Reported on 03/21/2019 06/19/18 09/17/18  Tish Men, MD  sucralfate (CARAFATE) 1 g tablet Take 1 tablet (1 g total) by mouth 4 (four) times daily. Patient not taking: Reported on 03/21/2019 06/29/18   Kyung Rudd, MD  prochlorperazine (COMPAZINE) 10 MG tablet Take 1 tablet (10 mg total) by mouth every 6 (six) hours as needed (Nausea or vomiting). 04/24/18 08/04/18  Tish Men, MD    Physical Exam: BP (!) 144/71 (BP Location: Left Arm)   Pulse (!) 102   Temp 98.1 F (36.7 C) (Oral)   Resp 20   Ht 5\' 10"  (1.778 m)   Wt 86.2 kg   SpO2 100%   BMI 27.26 kg/m   General: NAD Eyes: Normal ENT: Normal Neck: Supple Cardiovascular: S1, S2 present Respiratory: Diminished air entry bilaterally Abdomen: Soft, nontender, nondistended, bowel sounds  present Skin: Normal Musculoskeletal: Trace bilateral pedal edema noted Psychiatric: Normal mood Neurologic: No obvious focal neurologic deficits noted          Labs on Admission:  Basic Metabolic Panel: Recent Labs  Lab 03/30/19 2251  NA 137  K 3.8  CL 102  CO2 23  GLUCOSE 282*  BUN 23  CREATININE 1.61*  CALCIUM 8.4*   Liver Function Tests: Recent Labs  Lab 03/30/19 2251  AST 21  ALT 58*  ALKPHOS 80  BILITOT 0.5  PROT 6.4*  ALBUMIN 3.2*   Recent Labs  Lab 03/30/19 2251  LIPASE 33   No results for input(s): AMMONIA in the last 168 hours. CBC: Recent Labs  Lab 03/30/19 2251  WBC 8.3  NEUTROABS 5.2  HGB 9.4*  HCT 29.4*  MCV 92.7  PLT 202   Cardiac Enzymes: No results for input(s): CKTOTAL, CKMB, CKMBINDEX, TROPONINI in the last 168 hours.  BNP (last 3 results) Recent Labs    02/23/19 1644 03/30/19 2251  BNP 55.6 59.7    ProBNP (last 3 results) No results for input(s): PROBNP in the last 8760 hours.  CBG: Recent Labs  Lab 03/30/19 2247  03/31/19 0915 03/31/19 1558  GLUCAP 255* 111* 168*    Radiological Exams on Admission: CT Angio Chest PE W and/or Wo Contrast  Result Date: 03/31/2019 CLINICAL DATA:  Chest pain and tightness, history of lung malignancy EXAM: CT ANGIOGRAPHY CHEST WITH CONTRAST TECHNIQUE: Multidetector CT imaging of the chest was performed using the standard protocol during bolus administration of intravenous contrast. Multiplanar CT image reconstructions and MIPs were obtained to evaluate the vascular anatomy. CONTRAST:  161mL OMNIPAQUE IOHEXOL 350 MG/ML SOLN COMPARISON:  Chest radiograph 03/30/2019, CTA 02/23/2019 FINDINGS: Cardiovascular: Satisfactory opacification the pulmonary arteries to the segmental level. No pulmonary artery filling defects are identified. Central pulmonary arteries are normal caliber. Normal heart size. No pericardial effusion. Few scattered coronary artery calcifications are present. Atherosclerotic plaque within the normal caliber aorta. Normal 3 vessel branching of the arch. Proximal great vessels are mildly calcified without acute abnormality. There is an accessed left IJ approach Port-A-Cath with the tip positioned at the level of the superior cavoatrial junction. Mediastinum/Nodes: Stable size of a 9 mm AP window lymph node no new mediastinal, hilar or axillary adenopathy. Thyroid gland and thoracic inlet are unremarkable. Bowing of the trachea is a common finding with imaging during exhalation for the CT PA technique. Esophagus is unremarkable. Lungs/Pleura: Extensive cicatricial change in architectural distortion throughout both lungs but most pronounced within the right apex and superior segment right lower lobe. Stable appearance of the masslike opacity along the major fissure extending from the right hilum measuring approximately 14 by 21 mm in size (5/42) not significantly changed from 22 x 15 mm on comparison study. There are however increasingly coalescent opacities within the regions  of architectural distortion predominantly in the superior segment left lower lobe. Several truncated airways and mucous plugs are noted within this region. Stable 4 mm nodule in the left upper lobe anteriorly (5/40). Upper Abdomen: No acute abnormalities present in the visualized portions of the upper abdomen. Musculoskeletal: Multilevel degenerative changes are present in the imaged portions of the spine. Multilevel flowing anterior osteophytosis, compatible with features of diffuse idiopathic skeletal hyperostosis (DISH). No acute osseous abnormality or suspicious osseous lesion. Pseudoarticulations at the coracoclavicular intervals bilaterally are similar to prior. Review of the MIP images confirms the above findings. IMPRESSION: 1. No evidence for pulmonary embolus. 2. Stable appearance of the  masslike opacity along the right major fissure extending from the right hilum. 3. Increasingly coalescent opacities in the superior segment left lower lobe, could reflect inflammation/radiation pneumonitis, possible infection with associated mucous plugging and thickened airways, or progression of disease with lymphovascular spread. Recommend correlation patient's clinical status and if further evaluation is warranted, multidisciplinary clinic referral is recommended. 4. Stable 4 mm left upper lobe nodule. 5.  Aortic Atherosclerosis (ICD10-I70.0). Electronically Signed   By: Lovena Le M.D.   On: 03/31/2019 00:05   DG Chest Port 1 View  Result Date: 03/30/2019 CLINICAL DATA:  Shortness of breath and cough. EXAM: PORTABLE CHEST 1 VIEW COMPARISON:  February 23, 2019 FINDINGS: There is stable left-sided venous Port-A-Cath positioning. Mild, stable right parahilar opacity is seen. There is no evidence of a pleural effusion or pneumothorax. The heart size and mediastinal contours are within normal limits. Degenerative changes seen throughout the thoracic spine. IMPRESSION: 1. Mild stable right parahilar opacity which may  represent sequelae associated with known for tumor/post treatment changes. Electronically Signed   By: Virgina Norfolk M.D.   On: 03/30/2019 23:19    EKG: Independently reviewed.  Sinus tachycardia, no acute ST changes  Assessment/Plan Present on Admission: . Acute respiratory failure with hypoxia (HCC)  Active Problems:   Acute respiratory failure with hypoxia (HCC)   Sepsis likely 2/2 acute hypoxemic respiratory failure likely 2/2 HCAP versus worsening neoplastic process On presentation, febrile, tachycardic, tachypneic, lactic acidosis Currently afebrile, with no leukocytosis Currently requiring about 2 L of oxygen [place] COVID-19 test negative Lactic acid 2.7--> 1.6 status post IV fluid CTA chest showed no evidence of PE, possible left lower lobe opacities could reflect inflammation/radiation pneumonitis versus possible infection with associated mucous plugging and thickened airways or progression of disease with lymphovascular spread Status post IV ceftriaxone, azithromycin, will switch to cefepime to cover for HCAP Duo nebs scheduled, as needed, cough suppressant Supplemental oxygen as needed  ??Diabetes mellitus type II with hyperglycemia A1c pending  Continue SSI, glargine, glimepiride, Accu-Cheks, hypoglycemic protocol  ?? AKI Vs CKD stage IIIa Creatinine currently at new baseline Status post IV fluid Hold home ARB Daily BMP  Anemia of chronic disease Hemoglobin currently around baseline Daily CBC  Chronic diastolic HF Stable BNP 99.3 Echo done on 07/2018 showed EF of greater than 65%, left ventricular diastolic parameters are consistent with impaired relaxation  History of COPD Follows up with pulmonary at [facility] III lung cancer Follows up with oncology at [facility] Next appointment is on 04/08/2019  Chronic pain/osteoarthritis/diabetic peripheral neuropathy Continue home gabapentin, morphine IR      DVT prophylaxis: Heparin  Code Status:  Full  Family Communication: None at bedside  Disposition Plan: Likely home once stable  Consults called: None  Admission status: Inpatient    Alma Friendly MD [facility]   If 7PM-7AM, please contact night-coverage [email or web address]  03/31/2019, 6:48 PM

## 2019-03-31 NOTE — ED Provider Notes (Signed)
[facility] EMERGENCY DEPARTMENT Provider Note  CSN: [id number] Arrival date & time: 03/30/19 2235  Chief Complaint(s) Shortness of Breath  HPI Travis [name] is a 64 y.o. male with a past medical history listed below including lung cancer status post chemoradiation therapy completed in April who presents to the emergency department for several days of shortness of breath, coughing and epigastric pain.  Symptoms were gradual and worsened since onset.  Shortness of breath worse with exertion.  No known fevers or chills.  Patient reports being started on Lasix couple of weeks ago during his admission.  Reports he had peripheral edema which improved.  No known sick contacts or Covid exposure.  Currently no chest pain.  No nausea or vomiting.  No other physical complaints.  HPI  Past Medical History Past Medical History:  Diagnosis Date  . Arthritis   . Cancer ([place])    lung  . Chronic kidney disease   . Diabetes mellitus    TYPE II  . Dyspnea    at times  . Hypertension   . Pneumonia    "years ago"   Patient Active Problem List   Diagnosis Date Noted  . Acute respiratory failure with hypoxia ([place]) 03/31/2019  . Hyperglycemia 03/21/2019  . AKI (acute kidney injury) ([place]) 03/21/2019  . ARF (acute renal failure) ([place]) 03/20/2019  . Exertional dyspnea 09/04/2018  . Leukopenia due to antineoplastic chemotherapy ([place]) 06/05/2018  . Diabetes ([place]) 05/15/2018  . Diabetic neuropathy ([place]) 05/08/2018  . 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 COPD (chronic obstructive pulmonary disease) ([place]) 04/24/2018  . Right lower lobe lung mass   . Anemia due to antineoplastic chemotherapy 04/06/2018  . Lung cancer ([place]) 04/03/2018   Home Medication(s) Prior to Admission medications   Medication Sig Start Date End Date Taking? Authorizing Provider  albuterol (VENTOLIN HFA) 108 (90 Base) MCG/ACT inhaler Inhale 2 puffs  into the lungs every 6 (six) hours as needed for wheezing or shortness of breath. 07/09/18   Tish Men, MD  amitriptyline (ELAVIL) 50 MG tablet Take 50 mg by mouth at bedtime.    [provider]  amLODipine (NORVASC) 5 MG tablet Take 5 mg by mouth daily. 04/10/18   [provider]  budesonide-formoterol (SYMBICORT) 160-4.5 MCG/ACT inhaler Inhale 2 puffs into the lungs 2 (two) times daily. 07/09/18   Tish Men, MD  famotidine (PEPCID) 20 MG tablet Take 20 mg by mouth 2 (two) times daily. 03/11/19   [provider]  furosemide (LASIX) 40 MG tablet Take 40 mg by mouth 2 (two) times daily. 03/25/19   [provider]  gabapentin (NEURONTIN) 300 MG capsule Take 2 capsules by mouth twice daily Patient taking differently: Take 300 mg by mouth 2 (two) times daily.  12/25/18   Tish Men, MD  glimepiride (AMARYL) 2 MG tablet Take 2 mg by mouth daily with breakfast.  08/01/18   [provider]  glucose blood test strip 4x a day 07/01/18   Shamleffer, Melanie Crazier, MD  insulin aspart (NOVOLOG FLEXPEN) 100 UNIT/ML FlexPen Inject 6 Units into the skin 3 (three) times daily with meals. Patient taking differently: Inject 0-16 Units into the skin 3 (three) times daily as needed for high blood sugar.  07/01/18   Shamleffer, Melanie Crazier, MD  Insulin Glargine (LANTUS) 100 UNIT/ML Solostar Pen Inject 25 Units into the skin at bedtime. 03/22/19 04/21/19  Domenic Polite, MD  Insulin Pen Needle (BD PEN  NEEDLE NANO U/F) 32G X 4 MM MISC 4x daily 07/01/18   Shamleffer, Melanie Crazier, MD  lidocaine (XYLOCAINE) 2 % solution Patient: Mix 1part 2% viscous lidocaine, 1part H20. Swish & swallow 43mL of diluted mixture, 86min before meals and at bedtime, up to QID Patient taking differently: Use as directed 15 mLs in the mouth or throat 4 (four) times daily as needed for mouth pain. Patient: Mix 1part 2% viscous lidocaine, 1part H20. Swish & swallow 50mL of diluted mixture, 56min before meals  and at bedtime, up to QID 05/29/18   Tish Men, MD  magnesium oxide (MAG-OX) 400 (241.3 Mg) MG tablet Take 1 tablet by mouth 2 (two) times daily. 03/15/19   [provider]  metFORMIN (GLUCOPHAGE-XR) 500 MG 24 hr tablet Take 2 tablets (1,000 mg total) by mouth 2 (two) times daily. Patient not taking: Reported on 03/21/2019 06/19/18 09/17/18  Tish Men, MD  metoprolol succinate (TOPROL-XL) 50 MG 24 hr tablet Take 50 mg by mouth daily. 03/13/19   [provider]  morphine (MSIR) 15 MG tablet Take 15 mg by mouth every 8 (eight) hours as needed for pain. 01/22/19   [provider]  pantoprazole (PROTONIX) 40 MG tablet Take 40 mg by mouth daily. 03/05/19   [provider]  potassium chloride (KLOR-CON) 10 MEQ tablet Take 10 mEq by mouth daily. 03/11/19   [provider]  sucralfate (CARAFATE) 1 g tablet Take 1 tablet (1 g total) by mouth 4 (four) times daily. Patient not taking: Reported on 03/21/2019 06/29/18   Kyung Rudd, MD  torsemide (DEMADEX) 20 MG tablet Take 1 tablet (20 mg total) by mouth 2 (two) times daily. 03/24/19   Domenic Polite, MD  traMADol (ULTRAM) 50 MG tablet Take 1 tablet (50 mg total) by mouth every 8 (eight) hours as needed for moderate pain. 09/04/18   Tish Men, MD  TRELEGY ELLIPTA 100-62.5-25 MCG/INH AEPB Inhale 1 puff into the lungs daily. 02/26/19   [provider]  prochlorperazine (COMPAZINE) 10 MG tablet Take 1 tablet (10 mg total) by mouth every 6 (six) hours as needed (Nausea or vomiting). 04/24/18 08/04/18  Tish Men, MD                                                                                                                                    Past Surgical History Past Surgical History:  Procedure Laterality Date  . COLONOSCOPY W/ POLYPECTOMY    . IR IMAGING GUIDED PORT INSERTION  05/05/2018  . NASAL FRACTURE SURGERY    . VIDEO BRONCHOSCOPY WITH ENDOBRONCHIAL ULTRASOUND Right 04/20/2018   Procedure: VIDEO  BRONCHOSCOPY WITH ENDOBRONCHIAL ULTRASOUND;  Surgeon: Juanito Doom, MD;  Location: [facility] OR;  Service: Thoracic;  Laterality: Right;   Family History Family History  Problem Relation Age of Onset  . Leukemia Sister   . Cancer Brother   . Diabetes Mother     Social History  Social History   Tobacco Use  . Smoking status: Former Smoker    Packs/day: 1.00    Years: 45.00    Pack years: 45.00    Types: Cigarettes    Quit date: 03/31/2018    Years since quitting: 1.0  . Smokeless tobacco: Former Systems developer    Types: Chew    Quit date: 03/25/1973  Substance Use Topics  . Alcohol use: Not Currently  . Drug use: No   Allergies Patient has no known allergies.  Review of Systems Review of Systems All other systems are reviewed and are negative for acute change except as noted in the HPI  Physical Exam Vital Signs  I have reviewed the triage vital signs BP (!) 145/88   Pulse (!) 101   Temp (!) 100.9 F (38.3 C) (Rectal)   Resp 19   SpO2 95%   Physical Exam Vitals reviewed.  Constitutional:      General: He is not in acute distress.    Appearance: He is well-developed. He is not diaphoretic.  HENT:     Head: Normocephalic and atraumatic.     Nose: Nose normal.  Eyes:     General: No scleral icterus.       Right eye: No discharge.        Left eye: No discharge.     Conjunctiva/sclera: Conjunctivae normal.     Pupils: Pupils are equal, round, and reactive to light.  Cardiovascular:     Rate and Rhythm: Normal rate and regular rhythm.     Heart sounds: No murmur. No friction rub. No gallop.   Pulmonary:     Effort: Pulmonary effort is normal. Tachypnea present. No accessory muscle usage or retractions.     Breath sounds: No stridor or decreased air movement. Examination of the left-lower field reveals rales. Rales present. No wheezing or rhonchi.     Comments: Speaks in short sentences. Abdominal:     General: There is no distension.     Palpations: Abdomen is soft.      Tenderness: There is no abdominal tenderness.  Musculoskeletal:        General: No tenderness.     Cervical back: Normal range of motion and neck supple.     Right lower leg: 1+ Pitting Edema present.     Left lower leg: 1+ Pitting Edema present.  Skin:    General: Skin is warm and dry.     Findings: No erythema or rash.  Neurological:     Mental Status: He is alert and oriented to person, place, and time.     ED Results and Treatments Labs (all labs ordered are listed, but only abnormal results are displayed) Labs Reviewed  CBC WITH DIFFERENTIAL/PLATELET - Abnormal; Notable for the following components:      Result Value   RBC 3.17 (*)    Hemoglobin 9.4 (*)    HCT 29.4 (*)    Monocytes Absolute 1.1 (*)    Abs Immature Granulocytes 0.13 (*)    All other components within normal limits  COMPREHENSIVE METABOLIC PANEL - Abnormal; Notable for the following components:   Glucose, Bld 282 (*)    Creatinine, Ser 1.61 (*)    Calcium 8.4 (*)    Total Protein 6.4 (*)    Albumin 3.2 (*)    ALT 58 (*)    GFR calc non Af Amer 45 (*)    GFR calc Af Amer 53 (*)    All other components within normal limits  LACTIC ACID, PLASMA - Abnormal; Notable for the following components:   Lactic Acid, Venous 2.7 (*)    All other components within normal limits  CBG MONITORING, ED - Abnormal; Notable for the following components:   Glucose-Capillary 255 (*)    All other components within normal limits  SARS CORONAVIRUS 2 AG (30 MIN TAT)  CULTURE, BLOOD (ROUTINE X 2)  CULTURE, BLOOD (ROUTINE X 2)  URINE CULTURE  SARS CORONAVIRUS 2 (TAT 6-24 HRS)  LACTIC ACID, PLASMA  BRAIN NATRIURETIC PEPTIDE  LIPASE, BLOOD  URINALYSIS, ROUTINE W REFLEX MICROSCOPIC  TROPONIN I (HIGH SENSITIVITY)  TROPONIN I (HIGH SENSITIVITY)                                                                                                                         EKG  EKG Interpretation  Date/Time:  Tuesday March 30 2019  22:49:28 EST Ventricular Rate:  118 PR Interval:    QRS Duration: 83 QT Interval:  294 QTC Calculation: 412 R Axis:   70 Text Interpretation: Sinus tachycardia Probable left atrial enlargement No significant change since last tracing Confirmed by Addison Lank [phone]) on 03/30/2019 11:12:12 PM      Radiology CT Angio Chest PE W and/or Wo Contrast  Result Date: 03/31/2019 CLINICAL DATA:  Chest pain and tightness, history of lung malignancy EXAM: CT ANGIOGRAPHY CHEST WITH CONTRAST TECHNIQUE: Multidetector CT imaging of the chest was performed using the standard protocol during bolus administration of intravenous contrast. Multiplanar CT image reconstructions and MIPs were obtained to evaluate the vascular anatomy. CONTRAST:  136mL OMNIPAQUE IOHEXOL 350 MG/ML SOLN COMPARISON:  Chest radiograph 03/30/2019, CTA 02/23/2019 FINDINGS: Cardiovascular: Satisfactory opacification the pulmonary arteries to the segmental level. No pulmonary artery filling defects are identified. Central pulmonary arteries are normal caliber. Normal heart size. No pericardial effusion. Few scattered coronary artery calcifications are present. Atherosclerotic plaque within the normal caliber aorta. Normal 3 vessel branching of the arch. Proximal great vessels are mildly calcified without acute abnormality. There is an accessed left IJ approach Port-A-Cath with the tip positioned at the level of the superior cavoatrial junction. Mediastinum/Nodes: Stable size of a 9 mm AP window lymph node no new mediastinal, hilar or axillary adenopathy. Thyroid gland and thoracic inlet are unremarkable. Bowing of the trachea is a common finding with imaging during exhalation for the CT PA technique. Esophagus is unremarkable. Lungs/Pleura: Extensive cicatricial change in architectural distortion throughout both lungs but most pronounced within the right apex and superior segment right lower lobe. Stable appearance of the masslike opacity along the  major fissure extending from the right hilum measuring approximately 14 by 21 mm in size (5/42) not significantly changed from 22 x 15 mm on comparison study. There are however increasingly coalescent opacities within the regions of architectural distortion predominantly in the superior segment left lower lobe. Several truncated airways and mucous plugs are noted within this region. Stable 4 mm nodule in the left upper lobe anteriorly (5/40). Upper Abdomen: No  acute abnormalities present in the visualized portions of the upper abdomen. Musculoskeletal: Multilevel degenerative changes are present in the imaged portions of the spine. Multilevel flowing anterior osteophytosis, compatible with features of diffuse idiopathic skeletal hyperostosis (DISH). No acute osseous abnormality or suspicious osseous lesion. Pseudoarticulations at the coracoclavicular intervals bilaterally are similar to prior. Review of the MIP images confirms the above findings. IMPRESSION: 1. No evidence for pulmonary embolus. 2. Stable appearance of the masslike opacity along the right major fissure extending from the right hilum. 3. Increasingly coalescent opacities in the superior segment left lower lobe, could reflect inflammation/radiation pneumonitis, possible infection with associated mucous plugging and thickened airways, or progression of disease with lymphovascular spread. Recommend correlation patient's clinical status and if further evaluation is warranted, multidisciplinary clinic referral is recommended. 4. Stable 4 mm left upper lobe nodule. 5.  Aortic Atherosclerosis (ICD10-I70.0). Electronically Signed   By: Lovena Le M.D.   On: 03/31/2019 00:05   DG Chest Port 1 View  Result Date: 03/30/2019 CLINICAL DATA:  Shortness of breath and cough. EXAM: PORTABLE CHEST 1 VIEW COMPARISON:  February 23, 2019 FINDINGS: There is stable left-sided venous Port-A-Cath positioning. Mild, stable right parahilar opacity is seen. There is no  evidence of a pleural effusion or pneumothorax. The heart size and mediastinal contours are within normal limits. Degenerative changes seen throughout the thoracic spine. IMPRESSION: 1. Mild stable right parahilar opacity which may represent sequelae associated with known for tumor/post treatment changes. Electronically Signed   By: Virgina Norfolk M.D.   On: 03/30/2019 23:19    Pertinent labs & imaging results that were available during my care of the patient were reviewed by me and considered in my medical decision making (see chart for details).  Medications Ordered in ED Medications  0.9 %  sodium chloride infusion (1,000 mLs Intravenous New Bag/Given 03/31/19 0113)  acetaminophen (TYLENOL) suppository 650 mg (650 mg Rectal Given 03/30/19 2310)  morphine 4 MG/ML injection 4 mg (4 mg Intravenous Given 03/30/19 2341)  sodium chloride 0.9 % bolus 500 mL (0 mLs Intravenous Stopped 03/31/19 0130)  iohexol (OMNIPAQUE) 350 MG/ML injection 80 mL (100 mLs Intravenous Contrast Given 03/30/19 2346)  cefTRIAXone (ROCEPHIN) 2 g in sodium chloride 0.9 % 100 mL IVPB (0 g Intravenous Stopped 03/31/19 0147)  azithromycin (ZITHROMAX) 500 mg in sodium chloride 0.9 % 250 mL IVPB (0 mg Intravenous Stopped 03/31/19 0256)                                                                                                                                    Procedures .Critical Care Performed by: Fatima Blank, MD Authorized by: Fatima Blank, MD     CRITICAL CARE Performed by: Grayce Sessions Renaud Celli Total critical care time: 50 minutes Critical care time was exclusive of separately billable procedures and treating other patients. Critical care was necessary to treat or prevent imminent or life-threatening deterioration. Critical care  was time spent personally by me on the following activities: development of treatment plan with patient and/or surrogate as well as nursing, discussions with consultants,  evaluation of patient's response to treatment, examination of patient, obtaining history from patient or surrogate, ordering and performing treatments and interventions, ordering and review of laboratory studies, ordering and review of radiographic studies, pulse oximetry and re-evaluation of patient's condition.   (including critical care time)  Medical Decision Making / ED Course I have reviewed the nursing notes for this encounter and the patient's prior records (if available in EHR or on provided paperwork).   Kailo Kosik was evaluated in Emergency Department on 03/31/2019 for the symptoms described in the history of present illness. He was evaluated in the context of the global COVID-19 pandemic, which necessitated consideration that the patient might be at risk for infection with the SARS-CoV-2 virus that causes COVID-19. Institutional protocols and algorithms that pertain to the evaluation of patients at risk for COVID-19 are in a state of rapid change based on information released by regulatory bodies including the CDC and federal and state organizations. These policies and algorithms were followed during the patient's care in the ED.  Patient noted to be febrile, tachycardic and with increased work of breathing.  Lungs with good movement throughout.  Desatted with exertion.  Placed on 2 L nasal cannula.  rales in the left lower base.  Chest x-ray stable from prior without new opacities.  CT PE study obtained to rule out PE and assess for lung disease.  This showed evidence of left lower lobe inflammation concerning for infectious versus progressing neoplastic process.  Given source of likely infection with elevated lactic acid, code sepsis was initiated and patient was started on empiric antibiotics.  Small IV fluid bolus.  Lactic acid improved.  Patient does have mild peripheral edema but CT without evidence of pulmonary edema and BNP within normal limits, making CHF exacerbation less  likely.  Initial Covid was negative.  Case discussed with Dr. Hal Hope for admission to [facility].      Final Clinical Impression(s) / ED Diagnoses Final diagnoses:  Community acquired pneumonia of left lower lobe of lung  Sepsis with acute hypoxic respiratory failure without septic shock, due to unspecified organism [facility])      This chart was dictated using voice recognition software.  Despite best efforts to proofread,  errors can occur which can change the documentation meaning.   Fatima Blank, MD 03/31/19 [phone]

## 2019-03-31 NOTE — Progress Notes (Signed)
Pharmacy Antibiotic Note  Travis [name] is a 64 y.o. male admitted on 03/30/2019 with SOB.  Pt with hx of lung cancer.  Pharmacy has been consulted for cefepime for HCAP.  Plan: Cefepime 2gm IV q12h Follow renal function and clinical course  Height: 5\' 10"  (177.8 cm) Weight: 190 lb (86.2 kg) IBW/kg (Calculated) : 73  Temp (24hrs), Avg:99 F (37.2 C), Min:98.1 F (36.7 C), Max:100.9 F (38.3 C)  Recent Labs  Lab 03/30/19 2251 03/31/19 0100  WBC 8.3  --   CREATININE 1.61*  --   LATICACIDVEN 2.7* 1.6    Estimated Creatinine Clearance: 49.7 mL/min (A) (by C-G formula based on SCr of 1.61 mg/dL (H)).    No Known Allergies   Thank you for allowing pharmacy to be a part of this patient's care.  Dolly Rias RPh 03/31/2019, 6:15 PM

## 2019-04-01 ENCOUNTER — Inpatient Hospital Stay (HOSPITAL_COMMUNITY): Payer: BLUE CROSS/BLUE SHIELD

## 2019-04-01 DIAGNOSIS — J189 Pneumonia, unspecified organism: Secondary | ICD-10-CM

## 2019-04-01 LAB — URINE CULTURE: Culture: NO GROWTH

## 2019-04-01 LAB — HEMOGLOBIN A1C
Hgb A1c MFr Bld: 9 % — ABNORMAL HIGH (ref 4.8–5.6)
Mean Plasma Glucose: 211.6 mg/dL

## 2019-04-01 LAB — COMPREHENSIVE METABOLIC PANEL
ALT: 43 U/L (ref 0–44)
AST: 14 U/L — ABNORMAL LOW (ref 15–41)
Albumin: 2.7 g/dL — ABNORMAL LOW (ref 3.5–5.0)
Alkaline Phosphatase: 72 U/L (ref 38–126)
Anion gap: 9 (ref 5–15)
BUN: 12 mg/dL (ref 8–23)
CO2: 23 mmol/L (ref 22–32)
Calcium: 8.4 mg/dL — ABNORMAL LOW (ref 8.9–10.3)
Chloride: 106 mmol/L (ref 98–111)
Creatinine, Ser: 1.14 mg/dL (ref 0.61–1.24)
GFR calc Af Amer: >60 mL/min (ref >60)
GFR calc non Af Amer: >60 mL/min (ref >60)
Glucose, Bld: 115 mg/dL — ABNORMAL HIGH (ref 70–99)
Potassium: 3.8 mmol/L (ref 3.5–5.1)
Sodium: 138 mmol/L (ref 135–145)
Total Bilirubin: 0.5 mg/dL (ref 0.3–1.2)
Total Protein: 5.8 g/dL — ABNORMAL LOW (ref 6.5–8.1)

## 2019-04-01 LAB — GLUCOSE, CAPILLARY
Glucose-Capillary: 109 mg/dL — ABNORMAL HIGH (ref 70–99)
Glucose-Capillary: 89 mg/dL (ref 70–99)
Glucose-Capillary: 113 mg/dL — ABNORMAL HIGH (ref 70–99)

## 2019-04-01 LAB — CBC
HCT: 26.4 % — ABNORMAL LOW (ref 39.0–52.0)
Hemoglobin: 8.3 g/dL — ABNORMAL LOW (ref 13.0–17.0)
MCH: 29.4 pg (ref 26.0–34.0)
MCHC: 31.4 g/dL (ref 30.0–36.0)
MCV: 93.6 fL (ref 80.0–100.0)
Platelets: 201 10*3/uL (ref 150–400)
RBC: 2.82 MIL/uL — ABNORMAL LOW (ref 4.22–5.81)
RDW: 13.9 % (ref 11.5–15.5)
WBC: 6.1 10*3/uL (ref 4.0–10.5)
nRBC: 0.3 % — ABNORMAL HIGH (ref 0.0–0.2)

## 2019-04-01 LAB — HIV ANTIBODY (ROUTINE TESTING W REFLEX): HIV Screen 4th Generation wRfx: NONREACTIVE

## 2019-04-01 MED ORDER — CHLORHEXIDINE GLUCONATE CLOTH 2 % EX PADS
6.0000 | MEDICATED_PAD | Freq: Every day | CUTANEOUS | Status: DC
  Administered 2019-04-01 – 2019-04-02 (×2): 6 via TOPICAL

## 2019-04-01 MED ORDER — SODIUM CHLORIDE 0.9 % IV SOLN
2.0000 g | Freq: Three times a day (TID) | INTRAVENOUS | Status: DC
  Administered 2019-04-01 – 2019-04-02 (×3): 2 g via INTRAVENOUS
  Filled 2019-04-01 (×5): qty 2

## 2019-04-01 MED ORDER — PNEUMOCOCCAL VAC POLYVALENT 25 MCG/0.5ML IJ INJ
0.5000 mL | INJECTION | INTRAMUSCULAR | Status: AC
  Administered 2019-04-02: 0.5 mL via INTRAMUSCULAR
  Filled 2019-04-01: qty 0.5

## 2019-04-01 NOTE — Progress Notes (Signed)
Pharmacy Antibiotic Note  Arjuna Doeden is a 64 y.o. male admitted on 03/30/2019 with SOB.  Pt with hx of lung cancer.  Pharmacy has been consulted for cefepime for HCAP.  Assessment/Plan: Renal function improved and estimated CrCl > 75ml/min.   Will change cefepime from 2g IV q12h to 2gm IV q8h. Follow renal function and clinical course.  Height: 5\' 10"  (177.8 cm) Weight: 193 lb 12.6 oz (87.9 kg) IBW/kg (Calculated) : 73  Temp (24hrs), Avg:98.5 F (36.9 C), Min:97.8 F (36.6 C), Max:99.9 F (37.7 C)  Recent Labs  Lab 03/30/19 2251 03/31/19 0100 04/01/19 0539  WBC 8.3  --  6.1  CREATININE 1.61*  --  1.14  LATICACIDVEN 2.7* 1.6  --     Estimated Creatinine Clearance: 76 mL/min (by C-G formula based on SCr of 1.14 mg/dL).    No Known Allergies   Thank you for allowing pharmacy to be a part of this patient's care.  Shela Commons, PharmD, BCPS 04/01/2019, 11:15 AM

## 2019-04-01 NOTE — Progress Notes (Signed)
Date was placed on dressing of implanted port this shift. Norlene Duel RN, BSN

## 2019-04-01 NOTE — Progress Notes (Signed)
PROGRESS NOTE  Travis [name] WUJ:[id number] DOB: [birth year] DOA: 03/30/2019 PCP: Brock Ra, PA-C  HPI/Recap of past 24 hours: Travis [name] is a 64 y.o. 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 Of note, patient is followed at [facility] for his lung cancer, completed therapy in April 2020, and has a follow-up on 04/08/2019. In the ED, patient noted to be febrile, tachycardic, with increased work of breathing, desaturated with exertion and subsequently needed about 2 L of nasal cannula, labs showed creatinine of 1.61 lactic acid of 2.7, chest x-ray unremarkable, CTA chest showed evidence of left lower lobe inflammation concerning for infectious versus progressing neoplastic process.  Code sepsis was initiated due to the elevated lactic acid, patient received IV fluids and started on IV ceftriaxone/azithromycin.  Patient admitted for further management.    Today, patient still complained of some shortness of breath and none productive coughing spells with associated epigastric pain, reported generalized fatigue.  Denies any other chest pain, abdominal pain, nausea/vomiting, fever/chills.  Appears to be somewhat lethargic.  Assessment/Plan: Active Problems:   Acute respiratory failure with hypoxia (HCC)   Sepsis likely 2/2 acute hypoxemic respiratory failure likely 2/2 HCAP versus worsening neoplastic process On presentation, febrile, tachycardic, tachypneic, lactic acidosis Currently afebrile, with no leukocytosis Currently requiring about 2 L of oxygen [place] COVID-19 test negative Lactic acid 2.7--> 1.6 status post IV fluid BC x2, NGTD UC no growth CTA chest showed no evidence of PE, possible  left lower lobe opacities could reflect inflammation/radiation pneumonitis versus possible infection with associated mucous plugging and thickened airways or progression of disease with lymphovascular spread Continue cefepime to cover for HCAP Duo nebs scheduled, as needed, cough suppressant Supplemental oxygen as needed  Diabetes mellitus type II with hyperglycemia CBGs improved A1c 9, uncontrolled Continue SSI, glargine, Accu-Cheks, hypoglycemic protocol Hold home glimepiride  AKI Creatinine currently back to normal Status post IV fluid Hold home ARB, diuretics Daily BMP  Anemia of chronic disease Hemoglobin baseline 8-9, currently around baseline Daily CBC  Chronic diastolic HF Stable, appears euvolemic BNP 59.7 Echo done on 07/2018 showed EF of greater than 65%, left ventricular diastolic parameters are consistent with impaired relaxation Hold home diuretics  History of COPD Follows up with pulmonary at [facility] lung cancer Follows up with oncology at [facility] Next appointment is on 04/08/2019  Chronic pain/osteoarthritis/diabetic peripheral neuropathy Continue home gabapentin, morphine IR          Malnutrition Type:      Malnutrition Characteristics:      Nutrition Interventions:       Estimated body mass index is 27.81 kg/m as calculated from the following:   Height as of this encounter: '5\' 10"'  (1.778 m).   Weight as of this encounter: 87.9 kg.     Code Status: Full  Family Communication: None at bedside  Disposition Plan: Likely home   Consultants:  None  Procedures:  None  Antimicrobials:  Cefepime  DVT prophylaxis: Heparin [place]   Objective: Vitals:   04/01/19 0829 04/01/19 0851 04/01/19 1351 04/01/19 1351  BP:  134/77  137/73  Pulse:  (!) 103  (!) 105  Resp:    (!) 24  Temp:  98.3 F (36.8 C)  98.5 F (36.9 C)  TempSrc:  Oral  Oral  SpO2: 98% 95% 92% 93%  Weight:      Height:         Intake/Output Summary (Last 24 hours) at 04/01/2019 1555 Last data filed at 04/01/2019 0600 Gross per 24 hour  Intake 100.4 ml  Output --  Net 100.4 ml   Filed Weights   03/31/19 0759 04/01/19 0500  Weight: 86.2 kg 87.9 kg    Exam:  General: NAD, lethargic, deconditioned  Cardiovascular: S1, S2 present  Respiratory:  Diminished air entry bilaterally  Abdomen: Soft, nontender, nondistended, bowel sounds present  Musculoskeletal: No bilateral pedal edema noted  Skin: Normal  Psychiatry: Normal mood  Data Reviewed: CBC: Recent Labs  Lab 03/30/19 2251 04/01/19 0539  WBC 8.3 6.1  NEUTROABS 5.2  --   HGB 9.4* 8.3*  HCT 29.4* 26.4*  MCV 92.7 93.6  PLT 202 970   Basic Metabolic Panel: Recent Labs  Lab 03/30/19 2251 04/01/19 0539  NA 137 138  K 3.8 3.8  CL 102 106  CO2 23 23  GLUCOSE 282* 115*  BUN 23 12  CREATININE 1.61* 1.14  CALCIUM 8.4* 8.4*   GFR: Estimated Creatinine Clearance: 76 mL/min (by C-G formula based on SCr of 1.14 mg/dL). Liver Function Tests: Recent Labs  Lab 03/30/19 2251 04/01/19 0539  AST 21 14*  ALT 58* 43  ALKPHOS 80 72  BILITOT 0.5 0.5  PROT 6.4* 5.8*  ALBUMIN 3.2* 2.7*   Recent Labs  Lab 03/30/19 2251  LIPASE 33   No results for input(s): AMMONIA in the last 168 hours. Coagulation Profile: No results for input(s): INR, PROTIME in the last 168 hours. Cardiac Enzymes: No results for input(s): CKTOTAL, CKMB, CKMBINDEX, TROPONINI in the last 168 hours. BNP (last 3 results) No results for input(s): PROBNP in the last 8760 hours. HbA1C: Recent Labs    04/01/19 0539  HGBA1C 9.0*   CBG: Recent Labs  Lab 03/31/19 0915 03/31/19 1558 03/31/19 2123 04/01/19 0838 04/01/19 1200  GLUCAP 111* 168* 236* 109* 89   Lipid Profile: No results for input(s): CHOL, HDL, LDLCALC, TRIG, CHOLHDL, LDLDIRECT in the last 72 hours. Thyroid Function Tests: No results for input(s): TSH, T4TOTAL, FREET4, T3FREE, THYROIDAB in the last  72 hours. Anemia Panel: No results for input(s): VITAMINB12, FOLATE, FERRITIN, TIBC, IRON, RETICCTPCT in the last 72 hours. Urine analysis:    Component Value Date/Time   COLORURINE YELLOW 03/31/2019 0807   APPEARANCEUR CLEAR 03/31/2019 0807   LABSPEC 1.020 03/31/2019 0807   PHURINE 6.0 03/31/2019 0807   GLUCOSEU NEGATIVE 03/31/2019 0807   HGBUR TRACE (A) 03/31/2019 0807   BILIRUBINUR NEGATIVE 03/31/2019 0807   KETONESUR NEGATIVE 03/31/2019 0807   PROTEINUR 30 (A) 03/31/2019 0807   NITRITE NEGATIVE 03/31/2019 0807   LEUKOCYTESUR NEGATIVE 03/31/2019 0807   Sepsis Labs: '@LABRCNTIP' (procalcitonin:4,lacticidven:4)  ) Recent Results (from the past 240 hour(s))  SARS Coronavirus 2 Ag (30 min TAT) - Nasal Swab (BD Veritor Kit)     Status: None   Collection Time: 03/30/19 10:40 PM   Specimen: Nasal Swab (BD Veritor Kit)  Result Value Ref Range Status   SARS Coronavirus 2 Ag NEGATIVE NEGATIVE Final    Comment: (NOTE) SARS-CoV-2 antigen NOT DETECTED.  Negative results are presumptive.  Negative results do not preclude SARS-CoV-2 infection and should not be used as the sole basis for treatment or other patient management decisions, including infection  control decisions, particularly in the presence of clinical signs and  symptoms consistent with COVID-19,  or in those who have been in contact with the virus.  Negative results must be combined with clinical observations, patient history, and epidemiological information. The expected result is Negative. Fact Sheet for Patients: PodPark.tn Fact Sheet for Healthcare Providers: GiftContent.is This test is not yet approved or cleared by the [place] FDA and  has been authorized for detection and/or diagnosis of SARS-CoV-2 by FDA under an Emergency Use Authorization (EUA).  This EUA will remain in effect (meaning this test can be used) for the duration of  the COVID-19 de  claration under Section 564(b)(1) of the Act, 21 U.S.C. section 360bbb-3(b)(1), unless the authorization is terminated or revoked sooner. Performed at [facility], [address] [place], [place] [address]   Blood culture (routine x 2)     Status: None (Preliminary result)   Collection Time: 03/30/19 10:50 PM   Specimen: BLOOD  Result Value Ref Range Status   Specimen Description   Final    BLOOD PORTA CATH Performed at [facility], [address] [place], [place] [address]    Special Requests   Final    BOTTLES DRAWN AEROBIC AND ANAEROBIC Blood Culture adequate volume Performed at [facility], [address], [place], [place] [address]    Culture   Final    NO GROWTH 1 DAY Performed at [facility], [place] [address], [place], [place] [address]    Report Status PENDING  Incomplete  Blood culture (routine x 2)     Status: None (Preliminary result)   Collection Time: 03/30/19 11:05 PM   Specimen: BLOOD RIGHT ARM  Result Value Ref Range Status   Specimen Description   Final    BLOOD RIGHT ARM Performed at [facility], [address] [place], [place] [address]    Special Requests   Final    BOTTLES DRAWN AEROBIC AND ANAEROBIC Blood Culture adequate volume Performed at [facility], [address], [place], [place] [address]    Culture   Final    NO GROWTH 1 DAY Performed at [facility], [place] [address] [place], [place] [address]    Report Status PENDING  Incomplete  SARS CORONAVIRUS 2 (TAT 6-24 HRS) Nasopharyngeal Nasopharyngeal Swab     Status: None   Collection Time: 03/31/19  1:24 AM   Specimen: Nasopharyngeal Swab  Result Value Ref Range Status   SARS Coronavirus 2 NEGATIVE NEGATIVE Final    Comment: (NOTE) SARS-CoV-2 target nucleic acids are NOT DETECTED. The SARS-CoV-2 RNA is generally detectable in upper and lower respiratory specimens during the acute phase of infection. Negative results do  not preclude SARS-CoV-2 infection, do not rule out co-infections with other pathogens, and should not be used as the sole basis for treatment or other patient management decisions. Negative results must be combined with clinical observations, patient history, and epidemiological information. The expected result is Negative. Fact Sheet for Patients: SugarRoll.be Fact Sheet for Healthcare Providers: https://www.woods-mathews.com/ This test is not yet approved or cleared by the [place] FDA and  has been authorized for detection and/or diagnosis of SARS-CoV-2 by FDA under an Emergency Use Authorization (EUA). This EUA will remain  in effect (meaning this test can be used) for the duration of the COVID-19 declaration under Section 56 4(b)(1) of the Act, 21 U.S.C. section 360bbb-3(b)(1), unless the authorization is terminated or revoked sooner. Performed at [facility], [place] [address],  [place], [place] [address]   Urine culture     Status: None   Collection Time: 03/31/19  8:07 AM   Specimen: In/Out Cath Urine  Result Value Ref Range Status   Specimen Description   Final    IN/OUT CATH URINE Performed at [facility], [address] [place], [place] [address]    Special Requests   Final    NONE Performed at [facility], [address] [place], [place] [address]    Culture   Final    NO GROWTH Performed at [facility], [place] [address] [place],  [address]    Report Status 04/01/2019 FINAL  Final      Studies: No results found.  Scheduled Meds: . amitriptyline  50 mg Oral QHS  . amLODipine  5 mg Oral Daily  . Chlorhexidine Gluconate Cloth  6 each Topical Daily  . dextromethorphan-guaiFENesin  1 tablet Oral BID  . famotidine  20 mg Oral BID  . fluticasone furoate-vilanterol  1 puff Inhalation Daily  . gabapentin  300 mg Oral BID  . glimepiride  2 mg Oral Q breakfast  . heparin  5,000  Units Subcutaneous Q8H  . insulin aspart  0-15 Units Subcutaneous TID WC  . insulin glargine  25 Units Subcutaneous QHS  . levalbuterol  0.63 mg Nebulization TID  . metoprolol succinate  50 mg Oral Daily  . pantoprazole  40 mg Oral Daily  . sodium chloride flush  3 mL Intravenous Q12H    Continuous Infusions: . sodium chloride 1,000 mL (03/31/19 0113)  . ceFEPime (MAXIPIME) IV       LOS: 1 day     Alma Friendly, MD [facility]  If 7PM-7AM, please contact night-coverage [email or web address] 04/01/2019, 3:55 PM

## 2019-04-01 NOTE — Progress Notes (Signed)
   04/01/19 1351  Vitals  Temp 98.5 F (36.9 C)  Temp Source Oral  BP 137/73  MAP (mmHg) 91  BP Location Left Arm  BP Method Automatic  Patient Position (if appropriate) Lying  Pulse Rate (!) 105  Resp (!) 24  Oxygen Therapy  SpO2 93 %  O2 Device Nasal Cannula  O2 Flow Rate (L/min) 2 L/min  MEWS Score  MEWS RR 1  MEWS Pulse 1  MEWS Systolic 0  MEWS LOC 0  MEWS Temp 0  MEWS Score 2  MEWS Score Color Yellow  MEWS Assessment  Is this an acute change? Yes  MEWS guidelines implemented *See Row Information* Yellow  Provider Notification  Provider Name/Title Dr. Horris Latino  Date Provider Notified 04/01/19  Time Provider Notified 1412  Notification Type Page Shea Evans)  Notification Reason Change in status   Pt is laying in bed, relaxing, watching tv. Notified MD, no new orders

## 2019-04-02 DIAGNOSIS — E1169 Type 2 diabetes mellitus with other specified complication: Secondary | ICD-10-CM

## 2019-04-02 DIAGNOSIS — C3490 Malignant neoplasm of unspecified part of unspecified bronchus or lung: Secondary | ICD-10-CM

## 2019-04-02 DIAGNOSIS — Z794 Long term (current) use of insulin: Secondary | ICD-10-CM

## 2019-04-02 LAB — GLUCOSE, CAPILLARY
Glucose-Capillary: 113 mg/dL — ABNORMAL HIGH (ref 70–99)
Glucose-Capillary: 230 mg/dL — ABNORMAL HIGH (ref 70–99)

## 2019-04-02 MED ORDER — SENNOSIDES-DOCUSATE SODIUM 8.6-50 MG PO TABS
1.0000 | ORAL_TABLET | Freq: Two times a day (BID) | ORAL | Status: DC
  Administered 2019-04-02: 1 via ORAL
  Filled 2019-04-02: qty 1

## 2019-04-02 MED ORDER — DM-GUAIFENESIN ER 30-600 MG PO TB12: 1.0000 | ORAL_TABLET | Freq: Two times a day (BID) | ORAL | 0 refills | Status: AC

## 2019-04-02 MED ORDER — POLYETHYLENE GLYCOL 3350 17 G PO PACK
17.0000 g | PACK | Freq: Every day | ORAL | Status: DC
  Administered 2019-04-02: 17 g via ORAL
  Filled 2019-04-02: qty 1

## 2019-04-02 MED ORDER — POLYETHYLENE GLYCOL 3350 17 G PO PACK: 17.0000 g | PACK | Freq: Every day | ORAL | 0 refills | Status: DC

## 2019-04-02 MED ORDER — HEPARIN SOD (PORK) LOCK FLUSH 100 UNIT/ML IV SOLN
500.0000 [IU] | INTRAVENOUS | Status: AC | PRN
  Administered 2019-04-02: 500 [IU]
  Filled 2019-04-02: qty 5

## 2019-04-02 MED ORDER — CEFDINIR 300 MG PO CAPS: 600.0000 mg | ORAL_CAPSULE | Freq: Every day | ORAL | 0 refills | Status: AC

## 2019-04-02 NOTE — Discharge Summary (Signed)
Discharge Summary  Travis [name] EHM:[id number] DOB: Feb 16, 1956  PCP: Brock Ra, PA-C  Admit date: 03/30/2019 Discharge date: 04/02/2019  Time spent: 40 mins   Recommendations for Outpatient Follow-up:  1. PCP in 1 week 2. Oncology follow-up as scheduled at [facility] on 04/07/2019  Discharge Diagnoses:  Active Hospital Problems   Diagnosis Date Noted  . Acute respiratory failure with hypoxia ([place]) 03/31/2019    Resolved Hospital Problems  No resolved problems to display.    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 In the ED, patient noted to be febrile, tachycardic, with increased work of breathing, desaturated with exertion and subsequently needed about 2 L of nasal cannula, labs showed creatinine of 1.61 lactic acid of 2.7, chest x-ray unremarkable, CTA chest showed evidence of left lower lobe inflammation concerning for infectious versus progressing neoplastic process.Code sepsis was initiated due to the elevated lactic acid, patient received IV fluids and started on IV ceftriaxone/azithromycin. Patient admitted for further management.    Today, patient reports feeling much better, denies any new complaints, still with some cough/pleuritic chest pain, denies any abdominal pain,  nausea/vomiting, fever/chills, dizziness.  Patient was able to ambulate the hallway without any worsening shortness of breath or chest pain, saturated above 90% on room air.  Patient advised to follow-up with PCP in 1 week, as well as oncology as scheduled.   Hospital Course:  Active Problems:   Acute respiratory failure with hypoxia (HCC)   Sepsis likely 2/2 acute hypoxemic respiratory failure likely 2/2 HCAP versus worsening neoplastic process On presentation, febrile, tachycardic, tachypneic, lactic acidosis Currently afebrile, with no leukocytosis Currently not requiring supplemental O2 COVID-19 test negative Lactic acid 2.7-->1.6 status post IV fluid BC x2, NGTD UC no growth CTA chest showed no evidence of PE, possible left lower lobe opacities could reflect inflammation/radiation pneumonitis versus possible infection with associated mucous plugging and thickened airways or progression of disease with lymphovascular spread S/P cefepime to cover for HCAP, switch to cefdinir to complete 7 days total Continue home inhalers, cough suppressant Follow-up with PCP in 1 week  Diabetes mellitus type II with hyperglycemia CBGs improved A1c 9, uncontrolled Continue home insulin regimen, glimepiride  AKI Creatinine currently back to normal Status post IV fluid  Anemia of chronic disease Hemoglobin baseline 8-9, currently around baseline  Chronic diastolic HF Stable, appears euvolemic BNP 59.7 Echo done on 07/2018 showed EF of greater than 65%, left ventricular diastolic parameters are consistent with impaired relaxation Continue home diuretics  History of COPD Follow up with pulmonary at [facility] III lung cancer Follow up with oncology at [facility] Next appointment is on 04/07/2019  Chronic pain/osteoarthritis/diabetic peripheral neuropathy Continue home gabapentin, morphine IR        Malnutrition Type:      Malnutrition Characteristics:  Nutrition Interventions:      Estimated body mass index is 28.15 kg/m as calculated from the following:   Height as of this encounter: 5' 10" (1.778 m).   Weight as of this encounter: 89 kg.    Procedures:  None  Consultations:  None  Discharge Exam: BP (!) 142/80 (BP Location: Left Arm)   Pulse (!) 101   Temp (!) 97.5 F (36.4 C) (Oral)   Resp 20   Ht 5' 10" (1.778 m)   Wt 89 kg   SpO2 93%   BMI 28.15 kg/m   General: NAD Cardiovascular: S1, S2 present Respiratory: Diminished breath sounds bilaterally  Discharge Instructions You were cared for by a hospitalist during your hospital stay. If you have any questions about your discharge medications or the care you received while you were in the hospital after you are discharged, you can call the unit and asked to speak with the hospitalist on call if the hospitalist that took care of you is not available. Once you are discharged, your primary care physician will handle any further medical issues. Please note that NO REFILLS for any discharge medications will be authorized once you are discharged, as it is imperative that you return to your primary care physician (or establish a relationship with a primary care physician if you do not have one) for your aftercare needs so that they can reassess your need for medications and monitor your lab values.  Discharge Instructions    Diet - low sodium heart healthy   Complete by: As directed    Increase activity slowly   Complete by: As directed      Allergies as of 04/02/2019   No Known Allergies     Medication List    STOP taking these medications   metFORMIN 500 MG 24 hr tablet Commonly known as: GLUCOPHAGE-XR   sucralfate 1 g tablet Commonly known as: Carafate     TAKE these medications   albuterol 108 (90 Base) MCG/ACT inhaler Commonly known as: VENTOLIN HFA Inhale 2 puffs into the lungs every 6 (six) hours as needed for wheezing or shortness of breath.    amitriptyline 50 MG tablet Commonly known as: ELAVIL Take 50 mg by mouth at bedtime.   amLODipine 5 MG tablet Commonly known as: NORVASC Take 5 mg by mouth daily.   budesonide-formoterol 160-4.5 MCG/ACT inhaler Commonly known as: Symbicort Inhale 2 puffs into the lungs 2 (two) times daily.   cefdinir 300 MG capsule Commonly known as: OMNICEF Take 2 capsules (600 mg total) by mouth daily for 5 days.   dextromethorphan-guaiFENesin 30-600 MG 12hr tablet Commonly known as: MUCINEX DM Take 1 tablet by mouth 2 (two) times daily for 7 days.   famotidine 20 MG tablet Commonly known as: PEPCID Take 20 mg by mouth 2 (two) times daily.   furosemide 40 MG tablet Commonly known as: LASIX Take 40 mg by mouth 2 (two) times daily.   gabapentin 300 MG capsule Commonly known as: NEURONTIN Take 2 capsules by mouth twice daily What changed: how much to take   glimepiride 2 MG tablet Commonly known as: AMARYL Take 2 mg by mouth daily with breakfast.   glucose blood test strip 4x a day   insulin aspart 100 UNIT/ML FlexPen Commonly known as: NOVOLOG Inject 6-18 Units into the skin See admin instructions. BS 180-200 add extra 2 units BS 201-250 add 5 units BS 251-300 add 7 units 301-350 add 10 units 351-400 add 12 units  bs >  400 call PCP   Insulin Glargine 100 UNIT/ML Solostar Pen Commonly known as: LANTUS Inject 25 Units into the skin at bedtime.   Insulin Pen Needle 32G X 4 MM Misc Commonly known as: BD Pen Needle Nano U/F 4x daily   lidocaine 2 % solution Commonly known as: XYLOCAINE Patient: Mix 1part 2% viscous lidocaine, 1part H20. Swish & swallow 35m of diluted mixture, 323m before meals and at bedtime, up to QID What changed:   how much to take  how to take this  when to take this  reasons to take this   magnesium oxide 400 (241.3 Mg) MG tablet Commonly known as: MAG-OX Take 1 tablet by mouth 2 (two) times daily.   metoprolol succinate 50 MG 24 hr  tablet Commonly known as: TOPROL-XL Take 50 mg by mouth daily.   morphine 15 MG tablet Commonly known as: MSIR Take 15 mg by mouth every 8 (eight) hours as needed for pain.   pantoprazole 40 MG tablet Commonly known as: PROTONIX Take 40 mg by mouth daily.   polyethylene glycol 17 g packet Commonly known as: MIRALAX / GLYCOLAX Take 17 g by mouth daily. Start taking on: April 03, 2019   potassium chloride 10 MEQ tablet Commonly known as: KLOR-CON Take 10 mEq by mouth daily.   torsemide 20 MG tablet Commonly known as: DEMADEX Take 1 tablet (20 mg total) by mouth 2 (two) times daily.   traMADol 50 MG tablet Commonly known as: ULTRAM Take 1 tablet (50 mg total) by mouth every 8 (eight) hours as needed for moderate pain.   Trelegy Ellipta 100-62.5-25 MCG/INH Aepb Generic drug: Fluticasone-Umeclidin-Vilant Inhale 1 puff into the lungs daily.      No Known Allergies Follow-up Information    MaBrock RaPAVermontSchedule an appointment as soon as possible for a visit in 1 week(s).   Contact information: [address] [phone]            The results of significant diagnostics from this hospitalization (including imaging, microbiology, ancillary and laboratory) are listed below for reference.    Significant Diagnostic Studies: CT Angio Chest PE W and/or Wo Contrast  Result Date: 03/31/2019 CLINICAL DATA:  Chest pain and tightness, history of lung malignancy EXAM: CT ANGIOGRAPHY CHEST WITH CONTRAST TECHNIQUE: Multidetector CT imaging of the chest was performed using the standard protocol during bolus administration of intravenous contrast. Multiplanar CT image reconstructions and MIPs were obtained to evaluate the vascular anatomy. CONTRAST:  1006mMNIPAQUE IOHEXOL 350 MG/ML SOLN COMPARISON:  Chest radiograph 03/30/2019, CTA 02/23/2019 FINDINGS: Cardiovascular: Satisfactory opacification the pulmonary arteries to the segmental level. No  pulmonary artery filling defects are identified. Central pulmonary arteries are normal caliber. Normal heart size. No pericardial effusion. Few scattered coronary artery calcifications are present. Atherosclerotic plaque within the normal caliber aorta. Normal 3 vessel branching of the arch. Proximal great vessels are mildly calcified without acute abnormality. There is an accessed left IJ approach Port-A-Cath with the tip positioned at the level of the superior cavoatrial junction. Mediastinum/Nodes: Stable size of a 9 mm AP window lymph node no new mediastinal, hilar or axillary adenopathy. Thyroid gland and thoracic inlet are unremarkable. Bowing of the trachea is a common finding with imaging during exhalation for the CT PA technique. Esophagus is unremarkable. Lungs/Pleura: Extensive cicatricial change in architectural distortion throughout both lungs but most pronounced within the right apex and superior segment right lower lobe. Stable appearance of the masslike opacity along the major  fissure extending from the right hilum measuring approximately 14 by 21 mm in size (5/42) not significantly changed from 22 x 15 mm on comparison study. There are however increasingly coalescent opacities within the regions of architectural distortion predominantly in the superior segment left lower lobe. Several truncated airways and mucous plugs are noted within this region. Stable 4 mm nodule in the left upper lobe anteriorly (5/40). Upper Abdomen: No acute abnormalities present in the visualized portions of the upper abdomen. Musculoskeletal: Multilevel degenerative changes are present in the imaged portions of the spine. Multilevel flowing anterior osteophytosis, compatible with features of diffuse idiopathic skeletal hyperostosis (DISH). No acute osseous abnormality or suspicious osseous lesion. Pseudoarticulations at the coracoclavicular intervals bilaterally are similar to prior. Review of the MIP images confirms the  above findings. IMPRESSION: 1. No evidence for pulmonary embolus. 2. Stable appearance of the masslike opacity along the right major fissure extending from the right hilum. 3. Increasingly coalescent opacities in the superior segment left lower lobe, could reflect inflammation/radiation pneumonitis, possible infection with associated mucous plugging and thickened airways, or progression of disease with lymphovascular spread. Recommend correlation patient's clinical status and if further evaluation is warranted, multidisciplinary clinic referral is recommended. 4. Stable 4 mm left upper lobe nodule. 5.  Aortic Atherosclerosis (ICD10-I70.0). Electronically Signed   By: Lovena Le M.D.   On: 03/31/2019 00:05   DG Chest Port 1 View  Result Date: 03/30/2019 CLINICAL DATA:  Shortness of breath and cough. EXAM: PORTABLE CHEST 1 VIEW COMPARISON:  February 23, 2019 FINDINGS: There is stable left-sided venous Port-A-Cath positioning. Mild, stable right parahilar opacity is seen. There is no evidence of a pleural effusion or pneumothorax. The heart size and mediastinal contours are within normal limits. Degenerative changes seen throughout the thoracic spine. IMPRESSION: 1. Mild stable right parahilar opacity which may represent sequelae associated with known for tumor/post treatment changes. Electronically Signed   By: Virgina Norfolk M.D.   On: 03/30/2019 23:19   DG Abd Portable 1V  Result Date: 04/01/2019 CLINICAL DATA:  Abdominal distension EXAM: PORTABLE ABDOMEN - 1 VIEW COMPARISON:  08/08/2018 FINDINGS: Scattered large and small bowel gas is noted. Fecal material is noted throughout the colon consistent with a degree of mild constipation. Degenerative changes of lumbar spine are seen. No abnormal mass or abnormal calcifications are noted. IMPRESSION: Changes of constipation. Electronically Signed   By: Inez Catalina M.D.   On: 04/01/2019 18:25    Microbiology: Recent Results (from the past 240 hour(s))  SARS  Coronavirus 2 Ag (30 min TAT) - Nasal Swab (BD Veritor Kit)     Status: None   Collection Time: 03/30/19 10:40 PM   Specimen: Nasal Swab (BD Veritor Kit)  Result Value Ref Range Status   SARS Coronavirus 2 Ag NEGATIVE NEGATIVE Final    Comment: (NOTE) SARS-CoV-2 antigen NOT DETECTED.  Negative results are presumptive.  Negative results do not preclude SARS-CoV-2 infection and should not be used as the sole basis for treatment or other patient management decisions, including infection  control decisions, particularly in the presence of clinical signs and  symptoms consistent with COVID-19, or in those who have been in contact with the virus.  Negative results must be combined with clinical observations, patient history, and epidemiological information. The expected result is Negative. Fact Sheet for Patients: PodPark.tn Fact Sheet for Healthcare Providers: GiftContent.is This test is not yet approved or cleared by the [place] FDA and  has been authorized for detection and/or diagnosis of SARS-CoV-2  by FDA under an Emergency Use Authorization (EUA).  This EUA will remain in effect (meaning this test can be used) for the duration of  the COVID-19 de claration under Section 564(b)(1) of the Act, 21 U.S.C. section 360bbb-3(b)(1), unless the authorization is terminated or revoked sooner. Performed at [facility], [address] [place], [place] [address]   Blood culture (routine x 2)     Status: None (Preliminary result)   Collection Time: 03/30/19 10:50 PM   Specimen: BLOOD  Result Value Ref Range Status   Specimen Description   Final    BLOOD PORTA CATH Performed at [facility], [address] [place], [place] [address]    Special Requests   Final    BOTTLES DRAWN AEROBIC AND ANAEROBIC Blood Culture adequate volume Performed at [facility], [address] [place], [place]  [address]    Culture   Final    NO GROWTH 2 DAYS Performed at [facility], [place] [address], [place], [place] [address]    Report Status PENDING  Incomplete  Blood culture (routine x 2)     Status: None (Preliminary result)   Collection Time: 03/30/19 11:05 PM   Specimen: BLOOD RIGHT ARM  Result Value Ref Range Status   Specimen Description   Final    BLOOD RIGHT ARM Performed at [facility], [address] [place], [place] [address]    Special Requests   Final    BOTTLES DRAWN AEROBIC AND ANAEROBIC Blood Culture adequate volume Performed at [facility], [address] [place], [place] [address]    Culture   Final    NO GROWTH 2 DAYS Performed at [facility], [place] [address], [place], [place] [address]    Report Status PENDING  Incomplete  SARS CORONAVIRUS 2 (TAT 6-24 HRS) Nasopharyngeal Nasopharyngeal Swab     Status: None   Collection Time: 03/31/19  1:24 AM   Specimen: Nasopharyngeal Swab  Result Value Ref Range Status   SARS Coronavirus 2 NEGATIVE NEGATIVE Final    Comment: (NOTE) SARS-CoV-2 target nucleic acids are NOT DETECTED. The SARS-CoV-2 RNA is generally detectable in upper and lower respiratory specimens during the acute phase of infection. Negative results do not preclude SARS-CoV-2 infection, do not rule out co-infections with other pathogens, and should not be used as the sole basis for treatment or other patient management decisions. Negative results must be combined with clinical observations, patient history, and epidemiological information. The expected result is Negative. Fact Sheet for Patients: SugarRoll.be Fact Sheet for Healthcare Providers: https://www.woods-mathews.com/ This test is not yet approved or cleared by the [place] FDA and  has been authorized for detection and/or diagnosis of SARS-CoV-2 by FDA under an Emergency Use Authorization (EUA). This EUA will remain   in effect (meaning this test can be used) for the duration of the COVID-19 declaration under Section 56 4(b)(1) of the Act, 21 U.S.C. section 360bbb-3(b)(1), unless the authorization is terminated or revoked sooner. Performed at [facility], [place] [address], [place], [place] [address]   Urine culture     Status: None   Collection Time: 03/31/19  8:07 AM   Specimen: In/Out Cath Urine  Result Value Ref Range Status   Specimen Description   Final    IN/OUT CATH URINE Performed at [facility], [address] [place], [place] [address]    Special Requests  Final    NONE Performed at [facility], [address], [place], [place] [address]    Culture   Final    NO GROWTH Performed at [facility], [place] [address] [place], [place] [address]    Report Status 04/01/2019 FINAL  Final     Labs: Basic Metabolic Panel: Recent Labs  Lab 03/30/19 2251 04/01/19 0539  NA 137 138  K 3.8 3.8  CL 102 106  CO2 23 23  GLUCOSE 282* 115*  BUN 23 12  CREATININE 1.61* 1.14  CALCIUM 8.4* 8.4*   Liver Function Tests: Recent Labs  Lab 03/30/19 2251 04/01/19 0539  AST 21 14*  ALT 58* 43  ALKPHOS 80 72  BILITOT 0.5 0.5  PROT 6.4* 5.8*  ALBUMIN 3.2* 2.7*   Recent Labs  Lab 03/30/19 2251  LIPASE 33   No results for input(s): AMMONIA in the last 168 hours. CBC: Recent Labs  Lab 03/30/19 2251 04/01/19 0539  WBC 8.3 6.1  NEUTROABS 5.2  --   HGB 9.4* 8.3*  HCT 29.4* 26.4*  MCV 92.7 93.6  PLT 202 201   Cardiac Enzymes: No results for input(s): CKTOTAL, CKMB, CKMBINDEX, TROPONINI in the last 168 hours. BNP: BNP (last 3 results) Recent Labs    02/23/19 1644 03/30/19 2251  BNP 55.6 59.7    ProBNP (last 3 results) No results for input(s): PROBNP in the last 8760 hours.  CBG: Recent Labs  Lab 04/01/19 0838 04/01/19 1200 04/01/19 1817 04/02/19 0747 04/02/19 1153  GLUCAP 109* 89 113* 113* 230*       Signed:  Alma Friendly, MD [facility] 04/02/2019, 12:13 PM

## 2019-04-02 NOTE — Progress Notes (Signed)
Pt ambulated in hall. O2 sats remained >90% on RA. Pt denied SOB. Will notify MD.

## 2019-04-02 NOTE — Progress Notes (Signed)
Pt is being discharged home. Discharge instructions including medications and follow up appointments given. Pt had no questions at this time.

## 2019-04-05 LAB — CULTURE, BLOOD (ROUTINE X 2)
Culture: NO GROWTH
Culture: NO GROWTH
Special Requests: ADEQUATE
Special Requests: ADEQUATE

## 2019-05-23 ENCOUNTER — Other Ambulatory Visit: Payer: Self-pay | Admitting: Hematology

## 2019-05-23 DIAGNOSIS — G629 Polyneuropathy, unspecified: Secondary | ICD-10-CM

## 2019-05-24 ENCOUNTER — Other Ambulatory Visit: Payer: Self-pay

## 2019-05-24 ENCOUNTER — Encounter (HOSPITAL_BASED_OUTPATIENT_CLINIC_OR_DEPARTMENT_OTHER): Payer: Self-pay

## 2019-05-24 ENCOUNTER — Emergency Department (HOSPITAL_BASED_OUTPATIENT_CLINIC_OR_DEPARTMENT_OTHER): Payer: BLUE CROSS/BLUE SHIELD

## 2019-05-24 ENCOUNTER — Emergency Department (HOSPITAL_BASED_OUTPATIENT_CLINIC_OR_DEPARTMENT_OTHER)
Admission: EM | Admit: 2019-05-24 | Discharge: 2019-05-25 | Disposition: A | Discharge status: Home / Self Care | Payer: BLUE CROSS/BLUE SHIELD | Attending: Emergency Medicine | Admitting: Emergency Medicine

## 2019-05-24 DIAGNOSIS — Z79899 Other long term (current) drug therapy: Secondary | ICD-10-CM | POA: Insufficient documentation

## 2019-05-24 DIAGNOSIS — Z87891 Personal history of nicotine dependence: Secondary | ICD-10-CM | POA: Insufficient documentation

## 2019-05-24 DIAGNOSIS — Z794 Long term (current) use of insulin: Secondary | ICD-10-CM | POA: Insufficient documentation

## 2019-05-24 DIAGNOSIS — E1140 Type 2 diabetes mellitus with diabetic neuropathy, unspecified: Secondary | ICD-10-CM | POA: Insufficient documentation

## 2019-05-24 DIAGNOSIS — R0602 Shortness of breath: Secondary | ICD-10-CM | POA: Diagnosis not present

## 2019-05-24 DIAGNOSIS — I10 Essential (primary) hypertension: Secondary | ICD-10-CM | POA: Diagnosis not present

## 2019-05-24 DIAGNOSIS — M25552 Pain in left hip: Secondary | ICD-10-CM

## 2019-05-24 LAB — CBC WITH DIFFERENTIAL/PLATELET
Abs Immature Granulocytes: 0.03 10*3/uL (ref 0.00–0.07)
Basophils Absolute: 0 10*3/uL (ref 0.0–0.1)
Basophils Relative: 0 %
Eosinophils Absolute: 0.2 10*3/uL (ref 0.0–0.5)
Eosinophils Relative: 3 %
HCT: 37.8 % — ABNORMAL LOW (ref 39.0–52.0)
Hemoglobin: 11.9 g/dL — ABNORMAL LOW (ref 13.0–17.0)
Immature Granulocytes: 0 %
Lymphocytes Relative: 21 %
Lymphs Abs: 1.4 10*3/uL (ref 0.7–4.0)
MCH: 28.7 pg (ref 26.0–34.0)
MCHC: 31.5 g/dL (ref 30.0–36.0)
MCV: 91.1 fL (ref 80.0–100.0)
Monocytes Absolute: 0.8 10*3/uL (ref 0.1–1.0)
Monocytes Relative: 12 %
Neutro Abs: 4.4 10*3/uL (ref 1.7–7.7)
Neutrophils Relative %: 64 %
Platelets: 285 10*3/uL (ref 150–400)
RBC: 4.15 MIL/uL — ABNORMAL LOW (ref 4.22–5.81)
RDW: 14.2 % (ref 11.5–15.5)
WBC: 6.8 10*3/uL (ref 4.0–10.5)
nRBC: 0 % (ref 0.0–0.2)

## 2019-05-24 LAB — BASIC METABOLIC PANEL
Anion gap: 10 (ref 5–15)
BUN: 16 mg/dL (ref 8–23)
CO2: 25 mmol/L (ref 22–32)
Calcium: 9.3 mg/dL (ref 8.9–10.3)
Chloride: 105 mmol/L (ref 98–111)
Creatinine, Ser: 1.29 mg/dL — ABNORMAL HIGH (ref 0.61–1.24)
GFR calc Af Amer: >60 mL/min (ref >60)
GFR calc non Af Amer: 59 mL/min — ABNORMAL LOW (ref >60)
Glucose, Bld: 93 mg/dL (ref 70–99)
Potassium: 3.6 mmol/L (ref 3.5–5.1)
Sodium: 140 mmol/L (ref 135–145)

## 2019-05-24 MED ORDER — IOHEXOL 350 MG/ML SOLN
75.0000 mL | Freq: Once | INTRAVENOUS | Status: AC | PRN
  Administered 2019-05-24: 75 mL via INTRAVENOUS

## 2019-05-24 MED ORDER — SODIUM CHLORIDE 0.9 % IV SOLN
INTRAVENOUS | Status: DC | PRN
  Administered 2019-05-24: 1000 mL via INTRAVENOUS

## 2019-05-24 NOTE — Discharge Instructions (Signed)
Your lab work was reassuring.  Your CT of your chest did show some changes that are concerning for recurrence of cancer.  You need to follow-up with your hematologist and oncologist Dr. For further evaluation of this.  Then tomorrow and let them know that you were in the emergency department.  Your hip pain is most likely musculoskeletal. You can take Tylenol or Ibuprofen as directed for pain. You can alternate Tylenol and Ibuprofen every 4 hours. If you take Tylenol at 1pm, then you can take Ibuprofen at 5pm. Then you can take Tylenol again at 9pm.   Return the emergency department for any worsening shortness of breath, chest pain, difficulty walking or any other worsening or concerning symptoms.

## 2019-05-24 NOTE — ED Notes (Signed)
Ambulated patient per provider order to end of hall and back to patient room. Patient stable, pulse ox sustained 95% until just before return to room where it decreased to 92% on room air. Patient expresses no new discomfort.

## 2019-05-24 NOTE — ED Triage Notes (Signed)
Pt arrives to ED with c/o SOB X1 week, states he recently had a lung biopsy. Pt also c/o back pain radiating into left leg.

## 2019-05-24 NOTE — ED Provider Notes (Signed)
[name] Provider Note   CSN: [id number] Arrival date & time: 05/24/19  1639     History Chief Complaint  Patient presents with  . Shortness of Breath    Travis [name] is a 64 y.o. male past medical 3 of lung cancer, diabetes, hypertension, pneumonia who presents for evaluation of persistent shortness of breath.  He states that he has had shortness of breath for a while but feels like over the last week or so, it has been worse.  He states he feels like it is worse with exertion.  He has not noted any chest pain.  Patient states that he has been following with heme-onc at [facility].  He states that there was some abnormalities on a recent chest x-ray and so they had recently gotten a biopsy.  He states that it occurred about a week ago but he still does not know the results.  Patient states that he has had a cough but states the cough has been persistent.  Is not productive.  He has not noted any fevers.  Patient also reports some pain in his left hip that began today.  Patient states that when he was trying to get into his truck, his foot slipped from the railing and he hit it on the pavement.  He did not actually fall down or land on the hip.  He has been able to ambulate since then.  He feels like the pain radiates from the left hip into the back.  No numbness/weakness, redness, warmth, erythema.  The history is provided by the patient.       Past Medical History:  Diagnosis Date  . Arthritis   . Cancer ([place])    lung  . Chronic kidney disease   . Diabetes mellitus    TYPE II  . Dyspnea    at times  . Hypertension   . Pneumonia    "years ago"    Patient Active Problem List   Diagnosis Date Noted  . Acute respiratory failure with hypoxia ([place]) 03/31/2019  . Hyperglycemia 03/21/2019  . AKI (acute kidney injury) ([place]) 03/21/2019  . ARF (acute renal failure) ([place]) 03/20/2019  . Exertional dyspnea 09/04/2018  . Leukopenia due to  antineoplastic chemotherapy ([place]) 06/05/2018  . Diabetes ([place]) 05/15/2018  . Diabetic neuropathy ([place]) 05/08/2018  . 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 COPD (chronic obstructive pulmonary disease) ([place]) 04/24/2018  . Right lower lobe lung mass   . Anemia due to antineoplastic chemotherapy 04/06/2018  . Lung cancer ([place]) 04/03/2018    Past Surgical History:  Procedure Laterality Date  . COLONOSCOPY W/ POLYPECTOMY    . IR IMAGING GUIDED PORT INSERTION  05/05/2018  . NASAL FRACTURE SURGERY    . VIDEO BRONCHOSCOPY WITH ENDOBRONCHIAL ULTRASOUND Right 04/20/2018   Procedure: VIDEO BRONCHOSCOPY WITH ENDOBRONCHIAL ULTRASOUND;  Surgeon: Juanito Doom, MD;  Location: [facility] OR;  Service: Thoracic;  Laterality: Right;       Family History  Problem Relation Age of Onset  . Leukemia Sister   . Cancer Brother   . Diabetes Mother     Social History   Tobacco Use  . Smoking status: Former Smoker    Packs/day: 1.00    Years: 45.00    Pack years: 45.00    Types: Cigarettes    Quit date: 03/31/2018    Years since quitting: 1.1  . Smokeless tobacco: Former Systems developer  Types: Sarina Ser    Quit date: 03/25/1973  Substance Use Topics  . Alcohol use: Not Currently  . Drug use: No    Home Medications Prior to Admission medications   Medication Sig Start Date End Date Taking? Authorizing Provider  albuterol (VENTOLIN HFA) 108 (90 Base) MCG/ACT inhaler Inhale 2 puffs into the lungs every 6 (six) hours as needed for wheezing or shortness of breath. 07/09/18   Tish Men, MD  amitriptyline (ELAVIL) 50 MG tablet Take 50 mg by mouth at bedtime.    [provider]  amLODipine (NORVASC) 5 MG tablet Take 5 mg by mouth daily. 04/10/18   [provider]  budesonide-formoterol (SYMBICORT) 160-4.5 MCG/ACT inhaler Inhale 2 puffs into the lungs 2 (two) times daily. 07/09/18   Tish Men, MD  famotidine (PEPCID) 20 MG tablet Take 20  mg by mouth 2 (two) times daily. 03/11/19   [provider]  furosemide (LASIX) 40 MG tablet Take 40 mg by mouth 2 (two) times daily. 03/25/19   [provider]  gabapentin (NEURONTIN) 300 MG capsule Take 2 capsules by mouth twice daily Patient taking differently: Take 300 mg by mouth 2 (two) times daily.  12/25/18   Tish Men, MD  glimepiride (AMARYL) 2 MG tablet Take 2 mg by mouth daily with breakfast.  08/01/18   [provider]  glucose blood test strip 4x a day 07/01/18   Shamleffer, Melanie Crazier, MD  insulin aspart (NOVOLOG) 100 UNIT/ML FlexPen Inject 6-18 Units into the skin See admin instructions. BS 180-200 add extra 2 units BS 201-250 add 5 units BS 251-300 add 7 units 301-350 add 10 units 351-400 add 12 units  bs >400 call PCP 03/25/19   [provider]  Insulin Glargine (LANTUS) 100 UNIT/ML Solostar Pen Inject 25 Units into the skin at bedtime. 03/22/19 04/21/19  Domenic Polite, MD  Insulin Pen Needle (BD PEN NEEDLE NANO U/F) 32G X 4 MM MISC 4x daily 07/01/18   Shamleffer, Melanie Crazier, MD  lidocaine (XYLOCAINE) 2 % solution Patient: Mix 1part 2% viscous lidocaine, 1part H20. Swish & swallow 74mL of diluted mixture, 72min before meals and at bedtime, up to QID Patient taking differently: Use as directed 15 mLs in the mouth or throat 4 (four) times daily as needed for mouth pain. Patient: Mix 1part 2% viscous lidocaine, 1part H20. Swish & swallow 43mL of diluted mixture, 53min before meals and at bedtime, up to QID 05/29/18   Tish Men, MD  magnesium oxide (MAG-OX) 400 (241.3 Mg) MG tablet Take 1 tablet by mouth 2 (two) times daily. 03/15/19   [provider]  metoprolol succinate (TOPROL-XL) 50 MG 24 hr tablet Take 50 mg by mouth daily. 03/13/19   [provider]  morphine (MSIR) 15 MG tablet Take 15 mg by mouth every 8 (eight) hours as needed for pain. 01/22/19   [provider]  pantoprazole (PROTONIX) 40 MG tablet Take 40 mg by  mouth daily. 03/05/19   [provider]  polyethylene glycol (MIRALAX / GLYCOLAX) 17 g packet Take 17 g by mouth daily. 04/03/19   Alma Friendly, MD  potassium chloride (KLOR-CON) 10 MEQ tablet Take 10 mEq by mouth daily. 03/11/19   [provider]  torsemide (DEMADEX) 20 MG tablet Take 1 tablet (20 mg total) by mouth 2 (two) times daily. 03/24/19   Domenic Polite, MD  traMADol (ULTRAM) 50 MG tablet Take 1 tablet (50 mg total) by mouth every 8 (eight) hours as needed for moderate  pain. 09/04/18   Tish Men, MD  TRELEGY ELLIPTA 100-62.5-25 MCG/INH AEPB Inhale 1 puff into the lungs daily. 02/26/19   [provider]  prochlorperazine (COMPAZINE) 10 MG tablet Take 1 tablet (10 mg total) by mouth every 6 (six) hours as needed (Nausea or vomiting). 04/24/18 08/04/18  Tish Men, MD    Allergies    Patient has no known allergies.  Review of Systems   Review of Systems  Constitutional: Negative for fever.  Respiratory: Positive for shortness of breath. Negative for cough.   Cardiovascular: Negative for chest pain and leg swelling.  Gastrointestinal: Negative for abdominal pain, nausea and vomiting.  Genitourinary: Negative for dysuria and hematuria.  Musculoskeletal: Positive for back pain.       Hip pain  Neurological: Negative for weakness and headaches.  All other systems reviewed and are negative.   Physical Exam Updated Vital Signs BP (!) 148/81 (BP Location: Right Arm)   Pulse 85   Temp (!) 97.3 F (36.3 C) (Oral)   Resp 18   Ht 5\' 8"  (1.727 m)   Wt 86.2 kg   SpO2 93%   BMI 28.89 kg/m   Physical Exam Vitals and nursing note reviewed.  Constitutional:      Appearance: Normal appearance. He is well-developed.  HENT:     Head: Normocephalic and atraumatic.  Eyes:     General: Lids are normal.     Conjunctiva/sclera: Conjunctivae normal.     Pupils: Pupils are equal, round, and reactive to light.  Cardiovascular:     Rate and Rhythm: Normal rate  and regular rhythm.     Pulses: Normal pulses.     Heart sounds: Normal heart sounds. No murmur. No friction rub. No gallop.   Pulmonary:     Effort: Pulmonary effort is normal.     Breath sounds: Normal breath sounds.     Comments: Lungs clear to auscultation bilaterally.  Symmetric chest rise.  No wheezing, rales, rhonchi.  No evidence of respiratory distress.  Lungs clear to auscultation bilaterally. Abdominal:     Palpations: Abdomen is soft. Abdomen is not rigid.     Tenderness: There is no abdominal tenderness. There is no guarding.  Musculoskeletal:        General: Normal range of motion.     Cervical back: Full passive range of motion without pain.     Comments: Tenderness palpation left hip.  No deformity or crepitus noted.  No instability noted.  Tenderness palpation extends to the paraspinal muscles of the lower lumbar region.  No midline tenderness.  Flexion/tension intact.  No bony tenderness noted to left femur, left knee, left tib-fib, left ankle.  No tenderness palpation of the right lower extremity.  Full range of motion without any difficulty.  Bilateral lower extremities are symmetric in appearance without any overlying warmth, erythema, edema.  Skin:    General: Skin is warm and dry.     Capillary Refill: Capillary refill takes less than 2 seconds.  Neurological:     Mental Status: He is alert and oriented to person, place, and time.  Psychiatric:        Speech: Speech normal.     ED Results / Procedures / Treatments   Labs (all labs ordered are listed, but only abnormal results are displayed) Labs Reviewed  BASIC METABOLIC PANEL - Abnormal; Notable for the following components:      Result Value   Creatinine, Ser 1.29 (*)    GFR calc non Af Wyvonnia Lora  59 (*)    All other components within normal limits  CBC WITH DIFFERENTIAL/PLATELET - Abnormal; Notable for the following components:   RBC 4.15 (*)    Hemoglobin 11.9 (*)    HCT 37.8 (*)    All other components  within normal limits    EKG EKG Interpretation  Date/Time:  Monday May 24 2019 16:49:49 EST Ventricular Rate:  83 PR Interval:  166 QRS Duration: 86 QT Interval:  374 QTC Calculation: 439 R Axis:   76 Text Interpretation: Normal sinus rhythm Nonspecific T wave abnormality Abnormal ECG rate slower, otherwise similar to previous Confirmed by Theotis Burrow [phone]) on 05/24/2019 4:56:50 PM   Radiology DG Chest 2 View  Result Date: 05/24/2019 CLINICAL DATA:  Shortness of breath, history of lung cancer EXAM: CHEST - 2 VIEW COMPARISON:  03/30/2019 FINDINGS: Left chest wall port catheter is unchanged. Progression of right perihilar/lower lobe opacity. Stable elevation of the right hemidiaphragm. No pleural effusion. No pneumothorax. Stable cardiomediastinal contours. No acute osseous abnormality. IMPRESSION: Increased right perihilar/lower lobe opacity. This nonspecific and could reflect progression of treatment changes, pneumonia, or progression of disease. Electronically Signed   By: Macy Mis M.D.   On: 05/24/2019 18:53   CT Angio Chest PE W and/or Wo Contrast  Result Date: 05/24/2019 CLINICAL DATA:  Shortness of breath for 1 week, history of biopsy-proven lung carcinoma EXAM: CT ANGIOGRAPHY CHEST WITH CONTRAST TECHNIQUE: Multidetector CT imaging of the chest was performed using the standard protocol during bolus administration of intravenous contrast. Multiplanar CT image reconstructions and MIPs were obtained to evaluate the vascular anatomy. CONTRAST:  89mL OMNIPAQUE IOHEXOL 350 MG/ML SOLN COMPARISON:  Chest x-ray from earlier in the same day and prior CT from 03/30/2019 FINDINGS: Cardiovascular: Thoracic aorta and its branches demonstrate atherosclerotic calcifications without aneurysmal dilatation or dissection. No significant coronary calcifications are seen. No cardiac enlargement is noted. The pulmonary artery shows a normal branching pattern. No filling defects to suggest pulmonary  emboli are identified. Some attenuation of some of the lesions in the right lower lobe is noted related to the underlying mass lesion. Left chest wall port is seen with the catheter tip at the cavoatrial junction. Mediastinum/Nodes: Thoracic inlet is within normal limits. No sizable hilar or mediastinal adenopathy is noted. The esophagus as visualized appears within normal limits. Lungs/Pleura: The left lung is well aerated. A tiny area of nodular thickening along the major fissure is noted best seen on image number 59 of series 5 stable from the prior exam. Previously seen nodule in the medial aspect of the left upper lobe is again noted on image number 40 of series 5. No new nodular changes on the left are seen. On the right there are changes in the right hilum and extending posteriorly within the superior segment of the right lower lobe. These have increased in the interval from the prior exam consistent with progressive consolidation peripherally and likely progressive underlying neoplasm. The previously noted masslike density has increased in size measuring at least 3.4 x 1.9 cm although the actual degree of involvement is difficult due to the more peripheral consolidation. Additionally, there is some increase in nodular soft tissue density seen in the right upper lobe best seen on image number 41 of series 5 measuring approximately 13 by 9 mm in greatest dimension. No sizable effusion is seen. Some similar changes in the anterior aspect of the right upper lobe are noted. A tiny nodule is noted on image number 32 of series 5 in  the right upper lobe laterally relatively stable from the prior exam. Upper Abdomen: Visualized upper abdomen shows no acute abnormality. Musculoskeletal: Degenerative changes of the thoracic spine are noted. No acute rib abnormality is noted. No erosive changes are seen. Review of the MIP images confirms the above findings. IMPRESSION: No evidence of pulmonary emboli. Progression in the  masslike density in the superior segment of the right lower lobe as described with some more peripheral consolidation. A increase in nodularity is noted best seen on image number 41 of series 5 as described. These changes are consistent with progression of neoplasm. The need for further evaluation can be determined on a clinical basis. Emphysematous changes with scattered small nodular changes seen bilaterally stable from the previous exam. Aortic Atherosclerosis (ICD10-I70.0) and Emphysema (ICD10-J43.9). Electronically Signed   By: Inez Catalina M.D.   On: 05/24/2019 21:14   DG Hip Unilat W or Wo Pelvis 2-3 Views Left  Result Date: 05/24/2019 CLINICAL DATA:  64 year old male with fall and trauma to the left hip. EXAM: DG HIP (WITH OR WITHOUT PELVIS) 2-3V LEFT COMPARISON:  Abdominal radiograph dated 08/08/2018. FINDINGS: There is no acute fracture or dislocation. The bones are mildly osteopenic. There is mild osteoarthritic changes of the hips. The soft tissues are unremarkable. IMPRESSION: No acute fracture or dislocation. Electronically Signed   By: Anner Crete M.D.   On: 05/24/2019 21:25    Procedures Procedures (including critical care time)  Medications Ordered in ED Medications  0.9 %  sodium chloride infusion (1,000 mLs Intravenous New Bag/Given 05/24/19 1940)  iohexol (OMNIPAQUE) 350 MG/ML injection 75 mL (75 mLs Intravenous Contrast Given 05/24/19 2049)    ED Course  I have reviewed the triage vital signs and the nursing notes.  Pertinent labs & imaging results that were available during my care of the patient were reviewed by me and considered in my medical decision making (see chart for details).    MDM Rules/Calculators/A&P                      64 year old male with past history of lung cancer presents for evaluation of shortness of breath.  He states he is always had some mild shortness of breath since his lung cancer diagnosis about a year ago.  He states that over the last  week, he feels like it has been worse.  No chest pain.  Has a persistent cough but no fevers.  He reports that his oncologist recently did a biopsy.  Also reporting some left hip pain that began today.  He reports he was try to get into his truck and states his foot slipped off the railing, hitting the ground.  He did not actually fall and hit the ground.  On initially arrival, he is afebrile, nontoxic-appearing.  Vital signs are stable.  Consider infectious process versus recurrence of neoplasm.  Also consider PE given history of cancer diagnosis.  Do not suspect cardiac abnormalities.  Additionally, his leg exam is concerning for musculoskeletal injury.  Doubt fracture dislocation.  History/physical exam not concerning for ischemic limb, DVT.  We will plan for x-rays, labs, CTA of chest.  CBC shows no leukocytosis.  Hemoglobin stable 11.9.  BMP shows BUN of 16, creatinine of 1.29.  CT of chest shows progression the masslike density in the superior segment of the right lower lobe as described with more peripheral consolidation.  This is consistent with progression of neoplasm.  He has emphysematous changes noted.  Hip x-ray negative  for any acute bony abnormality.  Discussed results with patient.  Here in the ED, he has been hemodynamically stable with no signs of hypoxia.  I discussed with patient that he will need to follow-up with his heme-onc doctors.  He already has had a biopsy.  Patient was able to ambulate with O2 sats maintaining 95.  When he came back to the room, he was 92 but he has been sitting at 93 this entire time.  At this time, he is hemodynamically stable.  Patient can be followed up with outpatient. Discussed patient with Dr. Rex Kras. At this time, patient exhibits no emergent life-threatening condition that require further evaluation in ED or admission. Patient had ample opportunity for questions and discussion. All patient's questions were answered with full understanding.   Portions  of this note were generated with [employer]. Dictation errors may occur despite best attempts at proofreading.  Final Clinical Impression(s) / ED Diagnoses Final diagnoses:  SOB (shortness of breath)  Left hip pain    Rx / DC Orders ED Discharge Orders    None       Desma Mcgregor 05/25/19 0012    Little, Wenda Overland, MD 05/27/19 [phone]

## 2019-05-25 MED ORDER — HEPARIN SOD (PORK) LOCK FLUSH 100 UNIT/ML IV SOLN
INTRAVENOUS | Status: AC
  Administered 2019-05-25: 500 [IU]
  Filled 2019-05-25: qty 5

## 2019-06-12 ENCOUNTER — Other Ambulatory Visit: Payer: Self-pay

## 2019-06-12 ENCOUNTER — Encounter (HOSPITAL_BASED_OUTPATIENT_CLINIC_OR_DEPARTMENT_OTHER): Payer: Self-pay | Admitting: *Deleted

## 2019-06-12 ENCOUNTER — Emergency Department (HOSPITAL_BASED_OUTPATIENT_CLINIC_OR_DEPARTMENT_OTHER)
Admission: EM | Admit: 2019-06-12 | Discharge: 2019-06-12 | Disposition: A | Discharge status: Home / Self Care | Payer: BLUE CROSS/BLUE SHIELD | Attending: Emergency Medicine | Admitting: Emergency Medicine

## 2019-06-12 DIAGNOSIS — Z79899 Other long term (current) drug therapy: Secondary | ICD-10-CM | POA: Insufficient documentation

## 2019-06-12 DIAGNOSIS — I129 Hypertensive chronic kidney disease with stage 1 through stage 4 chronic kidney disease, or unspecified chronic kidney disease: Secondary | ICD-10-CM | POA: Diagnosis not present

## 2019-06-12 DIAGNOSIS — E1122 Type 2 diabetes mellitus with diabetic chronic kidney disease: Secondary | ICD-10-CM | POA: Insufficient documentation

## 2019-06-12 DIAGNOSIS — Z9221 Personal history of antineoplastic chemotherapy: Secondary | ICD-10-CM | POA: Insufficient documentation

## 2019-06-12 DIAGNOSIS — M545 Low back pain: Secondary | ICD-10-CM | POA: Insufficient documentation

## 2019-06-12 DIAGNOSIS — Z85118 Personal history of other malignant neoplasm of bronchus and lung: Secondary | ICD-10-CM | POA: Insufficient documentation

## 2019-06-12 DIAGNOSIS — M5442 Lumbago with sciatica, left side: Secondary | ICD-10-CM

## 2019-06-12 DIAGNOSIS — Z87891 Personal history of nicotine dependence: Secondary | ICD-10-CM | POA: Insufficient documentation

## 2019-06-12 DIAGNOSIS — N189 Chronic kidney disease, unspecified: Secondary | ICD-10-CM | POA: Insufficient documentation

## 2019-06-12 DIAGNOSIS — M79605 Pain in left leg: Secondary | ICD-10-CM | POA: Diagnosis not present

## 2019-06-12 DIAGNOSIS — E1140 Type 2 diabetes mellitus with diabetic neuropathy, unspecified: Secondary | ICD-10-CM | POA: Insufficient documentation

## 2019-06-12 DIAGNOSIS — J449 Chronic obstructive pulmonary disease, unspecified: Secondary | ICD-10-CM | POA: Diagnosis not present

## 2019-06-12 DIAGNOSIS — Z794 Long term (current) use of insulin: Secondary | ICD-10-CM | POA: Diagnosis not present

## 2019-06-12 LAB — URINALYSIS, ROUTINE W REFLEX MICROSCOPIC
Bilirubin Urine: NEGATIVE
Glucose, UA: 250 mg/dL — AB
Hgb urine dipstick: NEGATIVE
Ketones, ur: NEGATIVE mg/dL
Leukocytes,Ua: NEGATIVE
Nitrite: NEGATIVE
Protein, ur: NEGATIVE mg/dL
Specific Gravity, Urine: 1.02 (ref 1.005–1.030)
pH: 5.5 (ref 5.0–8.0)

## 2019-06-12 MED ORDER — LIDOCAINE 5 % EX PTCH: 1.0000 | MEDICATED_PATCH | CUTANEOUS | 0 refills | Status: DC

## 2019-06-12 MED ORDER — PREDNISONE 10 MG PO TABS: 40.0000 mg | ORAL_TABLET | Freq: Every day | ORAL | 0 refills | Status: AC

## 2019-06-12 NOTE — Discharge Instructions (Signed)
Please pick up medications and take as prescribed Continue monitoring your blood glucose at home. The steroids can elevate your sugars incidentally.  Follow up with your PCP for further evaluation of your pain.  Return to the ED for any worsening symptoms including worsening pain, numbness into your groin, holding onto urine, if you begin to have accidents and pee/poop on yourself, or any other concerning symptoms.

## 2019-06-12 NOTE — ED Triage Notes (Signed)
Pt reports left leg pain "for a while". States pain starts in his low back, wraps around hip and radiates down his left leg. States pain is relieved if he "sits the right way". He was seen here for same earlier this month. Pt ambulated to triage with slow but steady gait

## 2019-06-12 NOTE — ED Provider Notes (Signed)
[facility] Provider Note   CSN: [id number] Arrival date & time: 06/12/19  2032     History Chief Complaint  Patient presents with  . Leg Pain    Travis [name] is a 64 y.o. male with PMhx lung cancer (in remission), CKD, diabetes, HTN who presents to the ED today complaining of gradual onset, constant, waxing and waning, left lower back pain radiating down left leg x 3-4 weeks. Pt reports he was seen in the ED for this same pain at the beginning of the month. Per chart review pt was seen on 03/01 for SOB as well as left hip pain. He had an xray done at that time of his hip without acute findings. Pt was advised to take Tylenol and to follow up with his PCP as time of discharge. He reports he does not have a follow up appointment for another 2 weeks. He has been taking Tylenol without relief. Pt reports the pain is exacerbated with sitting a certain way or standing for prolonged periods of time. He denies any fevers or chills. No recent spinal manipulation. No hx IVDA. Denies urinary retention, urinary or bowel incontinence, saddle anesthesia, or any other associated symptoms.   The history is provided by the patient and medical records.       Past Medical History:  Diagnosis Date  . Arthritis   . Cancer ([place])    lung  . Chronic kidney disease   . Diabetes mellitus    TYPE II  . Dyspnea    at times  . Hypertension   . Pneumonia    "years ago"    Patient Active Problem List   Diagnosis Date Noted  . Acute respiratory failure with hypoxia ([place]) 03/31/2019  . Hyperglycemia 03/21/2019  . AKI (acute kidney injury) ([place]) 03/21/2019  . ARF (acute renal failure) ([place]) 03/20/2019  . Exertional dyspnea 09/04/2018  . Leukopenia due to antineoplastic chemotherapy ([place]) 06/05/2018  . Diabetes ( [place]) 05/15/2018  . Diabetic neuropathy ([place]) 05/08/2018  . 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 COPD (chronic obstructive pulmonary disease) ([place]) 04/24/2018  . Right lower lobe lung mass   . Anemia due to antineoplastic chemotherapy 04/06/2018  . Lung cancer ([place]) 04/03/2018    Past Surgical History:  Procedure Laterality Date  . COLONOSCOPY W/ POLYPECTOMY    . IR IMAGING GUIDED PORT INSERTION  05/05/2018  . NASAL FRACTURE SURGERY    . VIDEO BRONCHOSCOPY WITH ENDOBRONCHIAL ULTRASOUND Right 04/20/2018   Procedure: VIDEO BRONCHOSCOPY WITH ENDOBRONCHIAL ULTRASOUND;  Surgeon: Juanito Doom, MD;  Location: [facility] OR;  Service: Thoracic;  Laterality: Right;       Family History  Problem Relation Age of Onset  . Leukemia Sister   . Cancer Brother   . Diabetes Mother     Social History   Tobacco Use  . Smoking status: Former Smoker    Packs/day: 1.00    Years: 45.00    Pack years: 45.00    Types: Cigarettes    Quit date: 03/31/2018    Years since quitting: 1.2  . Smokeless tobacco: Former Systems developer    Types: Chew    Quit date: 03/25/1973  Substance Use Topics  . Alcohol use: Not Currently  . Drug use: No    Home Medications Prior to Admission medications   Medication Sig Start Date End Date Taking? Authorizing Provider  albuterol (VENTOLIN HFA) 108 (90 Base) MCG/ACT inhaler Inhale  2 puffs into the lungs every 6 (six) hours as needed for wheezing or shortness of breath. 07/09/18   Tish Men, MD  amitriptyline (ELAVIL) 50 MG tablet Take 50 mg by mouth at bedtime.    [provider]  amLODipine (NORVASC) 5 MG tablet Take 5 mg by mouth daily. 04/10/18   [provider]  budesonide-formoterol (SYMBICORT) 160-4.5 MCG/ACT inhaler Inhale 2 puffs into the lungs 2 (two) times daily. 07/09/18   Tish Men, MD  famotidine (PEPCID) 20 MG tablet Take 20 mg by mouth 2 (two) times daily. 03/11/19   [provider]  furosemide (LASIX) 40 MG tablet Take 40 mg by mouth 2 (two) times daily. 03/25/19   [provider]  gabapentin (NEURONTIN) 300 MG  capsule Take 2 capsules by mouth twice daily Patient taking differently: Take 300 mg by mouth 2 (two) times daily.  12/25/18   Tish Men, MD  glimepiride (AMARYL) 2 MG tablet Take 2 mg by mouth daily with breakfast.  08/01/18   [provider]  glucose blood test strip 4x a day 07/01/18   Shamleffer, Melanie Crazier, MD  insulin aspart (NOVOLOG) 100 UNIT/ML FlexPen Inject 6-18 Units into the skin See admin instructions. BS 180-200 add extra 2 units BS 201-250 add 5 units BS 251-300 add 7 units 301-350 add 10 units 351-400 add 12 units  bs >400 call PCP 03/25/19   [provider]  Insulin Glargine (LANTUS) 100 UNIT/ML Solostar Pen Inject 25 Units into the skin at bedtime. 03/22/19 04/21/19  Domenic Polite, MD  Insulin Pen Needle (BD PEN NEEDLE NANO U/F) 32G X 4 MM MISC 4x daily 07/01/18   Shamleffer, Melanie Crazier, MD  lidocaine (LIDODERM) 5 % Place 1 patch onto the skin daily. Remove & Discard patch within 12 hours or as directed by MD 06/12/19   Eustaquio Maize, PA-C  lidocaine (XYLOCAINE) 2 % solution Patient: Mix 1part 2% viscous lidocaine, 1part H20. Swish & swallow 109mL of diluted mixture, 30min before meals and at bedtime, up to QID Patient taking differently: Use as directed 15 mLs in the mouth or throat 4 (four) times daily as needed for mouth pain. Patient: Mix 1part 2% viscous lidocaine, 1part H20. Swish & swallow 89mL of diluted mixture, 53min before meals and at bedtime, up to QID 05/29/18   Tish Men, MD  magnesium oxide (MAG-OX) 400 (241.3 Mg) MG tablet Take 1 tablet by mouth 2 (two) times daily. 03/15/19   [provider]  metoprolol succinate (TOPROL-XL) 50 MG 24 hr tablet Take 50 mg by mouth daily. 03/13/19   [provider]  morphine (MSIR) 15 MG tablet Take 15 mg by mouth every 8 (eight) hours as needed for pain. 01/22/19   [provider]  pantoprazole (PROTONIX) 40 MG tablet Take 40 mg by mouth daily. 03/05/19   [provider]    polyethylene glycol (MIRALAX / GLYCOLAX) 17 g packet Take 17 g by mouth daily. 04/03/19   Alma Friendly, MD  potassium chloride (KLOR-CON) 10 MEQ tablet Take 10 mEq by mouth daily. 03/11/19   [provider]  predniSONE (DELTASONE) 10 MG tablet Take 4 tablets (40 mg total) by mouth daily for 5 days. 06/12/19 06/17/19  Eustaquio Maize, PA-C  torsemide (DEMADEX) 20 MG tablet Take 1 tablet (20 mg total) by mouth 2 (two) times daily. 03/24/19   Domenic Polite, MD  traMADol (ULTRAM) 50 MG tablet Take 1 tablet (50 mg total) by mouth every 8 (eight) hours as needed  for moderate pain. 09/04/18   Tish Men, MD  TRELEGY ELLIPTA 100-62.5-25 MCG/INH AEPB Inhale 1 puff into the lungs daily. 02/26/19   [provider]  prochlorperazine (COMPAZINE) 10 MG tablet Take 1 tablet (10 mg total) by mouth every 6 (six) hours as needed (Nausea or vomiting). 04/24/18 08/04/18  Tish Men, MD    Allergies    Patient has no known allergies.  Review of Systems   Review of Systems  Constitutional: Negative for chills and fever.  Musculoskeletal: Positive for arthralgias and back pain.  Neurological: Negative for weakness and numbness.  All other systems reviewed and are negative.   Physical Exam Updated Vital Signs BP (!) 143/70 (BP Location: Left Arm)   Pulse (!) 112   Temp 98.4 F (36.9 C) (Oral)   Resp 18   Ht 5\' 8"  (1.727 m)   Wt 86.1 kg   SpO2 96%   BMI 28.86 kg/m   Physical Exam Vitals and nursing note reviewed.  Constitutional:      Appearance: He is not ill-appearing or diaphoretic.  HENT:     Head: Normocephalic and atraumatic.  Eyes:     Conjunctiva/sclera: Conjunctivae normal.  Cardiovascular:     Rate and Rhythm: Normal rate and regular rhythm.  Pulmonary:     Effort: Pulmonary effort is normal.     Breath sounds: Normal breath sounds. No wheezing, rhonchi or rales.  Abdominal:     Palpations: Abdomen is soft.     Tenderness: There is no abdominal tenderness.   Musculoskeletal:     Cervical back: Neck supple.       Back:     Comments: No C, T, or L midline spinal TTP. + left paralumbar spinal TTP. No obvious hip tenderness to palpation. + SLR on left. Negative SLR on R. Strength and sensation intact to BLEs. 2+ DP and PT pulses.   Skin:    General: Skin is warm and dry.  Neurological:     Mental Status: He is alert.     ED Results / Procedures / Treatments   Labs (all labs ordered are listed, but only abnormal results are displayed) Labs Reviewed  URINALYSIS, ROUTINE W REFLEX MICROSCOPIC - Abnormal; Notable for the following components:      Result Value   Glucose, UA 250 (*)    All other components within normal limits    EKG None  Radiology No results found.  Procedures Procedures (including critical care time)  Medications Ordered in ED Medications - No data to display  ED Course  I have reviewed the triage vital signs and the nursing notes.  Pertinent labs & imaging results that were available during my care of the patient were reviewed by me and considered in my medical decision making (see chart for details).  64 year old male who presents to the ED today complaining of left lower back pain radiating down left leg x3 weeks.  Seen in the ED 3 weeks ago reporting pain was in his left hip.  He had a left hip x-ray done without acute findings.  He has been taking Tylenol without relief.  On arrival to the ED patient is afebrile, mildly tachycardic, nontachynpeic. During exam pt's HR in the 90s. He has no midline spinal tenderness but positive lumbar paraspinal TTP. He does report sometimes it wraps around to the front of his leg into his inguinal area however no pain directly into his testicles and denying urinary symptoms. No red flag symptoms concerning for cauda equina,  spinal epidural abscess, or AAA. Will check a U/A at this time to ensure there is no Hgb to account for kidney stones however pt without any CVA tenderness on  exam. Findings are more consistent with sciatica. Pt does however have diabetes and last A1c in January of 9.0.   U/A with glucose but no other acute findings. Had lengthy discussion with pt regarding risks vs benefits of steroids with his diabetes and elevated A1c. He understands that it may increase his blood glucose level and would still like to try to see if it helps with his symptoms. Will discharge home with steroid burst and have pt follow up with his PCP for further evaluation. Strict return precautions discussed with pt including worsening pain, numbness in his groin, urinary retention, urinary or bowel incontinence, or any other concerning symptoms. Pt is in agreement with plan and stable for discharge home.   This note was prepared using Dragon voice recognition software and may include unintentional dictation errors due to the inherent limitations of voice recognition software.     MDM Rules/Calculators/A&P                       Final Clinical Impression(s) / ED Diagnoses Final diagnoses:  Acute left-sided low back pain with left-sided sciatica    Rx / DC Orders ED Discharge Orders         Ordered    lidocaine (LIDODERM) 5 %  Every 24 hours     06/12/19 2258    predniSONE (DELTASONE) 10 MG tablet  Daily     06/12/19 2258           Discharge Instructions     Please pick up medications and take as prescribed Continue monitoring your blood glucose at home. The steroids can elevate your sugars incidentally.  Follow up with your PCP for further evaluation of your pain.  Return to the ED for any worsening symptoms including worsening pain, numbness into your groin, holding onto urine, if you begin to have accidents and pee/poop on yourself, or any other concerning symptoms.        Eustaquio Maize, PA-C 06/12/19 2259    Isla Pence, MD 06/12/19 [phone]

## 2019-08-24 ENCOUNTER — Other Ambulatory Visit: Payer: Self-pay | Admitting: Hematology

## 2019-10-08 ENCOUNTER — Emergency Department (HOSPITAL_BASED_OUTPATIENT_CLINIC_OR_DEPARTMENT_OTHER): Payer: BLUE CROSS/BLUE SHIELD

## 2019-10-08 ENCOUNTER — Other Ambulatory Visit: Payer: Self-pay

## 2019-10-08 ENCOUNTER — Emergency Department (HOSPITAL_BASED_OUTPATIENT_CLINIC_OR_DEPARTMENT_OTHER)
Admission: EM | Admit: 2019-10-08 | Discharge: 2019-10-09 | Disposition: A | Discharge status: Home / Self Care | Payer: BLUE CROSS/BLUE SHIELD | Attending: Emergency Medicine | Admitting: Emergency Medicine

## 2019-10-08 ENCOUNTER — Encounter (HOSPITAL_BASED_OUTPATIENT_CLINIC_OR_DEPARTMENT_OTHER): Payer: Self-pay | Admitting: *Deleted

## 2019-10-08 DIAGNOSIS — Z794 Long term (current) use of insulin: Secondary | ICD-10-CM | POA: Diagnosis not present

## 2019-10-08 DIAGNOSIS — Y939 Activity, unspecified: Secondary | ICD-10-CM | POA: Diagnosis not present

## 2019-10-08 DIAGNOSIS — N189 Chronic kidney disease, unspecified: Secondary | ICD-10-CM | POA: Insufficient documentation

## 2019-10-08 DIAGNOSIS — Y999 Unspecified external cause status: Secondary | ICD-10-CM | POA: Diagnosis not present

## 2019-10-08 DIAGNOSIS — X501XXA Overexertion from prolonged static or awkward postures, initial encounter: Secondary | ICD-10-CM | POA: Insufficient documentation

## 2019-10-08 DIAGNOSIS — Z79899 Other long term (current) drug therapy: Secondary | ICD-10-CM | POA: Diagnosis not present

## 2019-10-08 DIAGNOSIS — Z7951 Long term (current) use of inhaled steroids: Secondary | ICD-10-CM | POA: Insufficient documentation

## 2019-10-08 DIAGNOSIS — Z87891 Personal history of nicotine dependence: Secondary | ICD-10-CM | POA: Insufficient documentation

## 2019-10-08 DIAGNOSIS — Z85118 Personal history of other malignant neoplasm of bronchus and lung: Secondary | ICD-10-CM | POA: Insufficient documentation

## 2019-10-08 DIAGNOSIS — Y929 Unspecified place or not applicable: Secondary | ICD-10-CM | POA: Diagnosis not present

## 2019-10-08 DIAGNOSIS — E1122 Type 2 diabetes mellitus with diabetic chronic kidney disease: Secondary | ICD-10-CM | POA: Insufficient documentation

## 2019-10-08 DIAGNOSIS — S96811A Strain of other specified muscles and tendons at ankle and foot level, right foot, initial encounter: Secondary | ICD-10-CM | POA: Diagnosis not present

## 2019-10-08 DIAGNOSIS — R0602 Shortness of breath: Secondary | ICD-10-CM | POA: Diagnosis not present

## 2019-10-08 DIAGNOSIS — I129 Hypertensive chronic kidney disease with stage 1 through stage 4 chronic kidney disease, or unspecified chronic kidney disease: Secondary | ICD-10-CM | POA: Insufficient documentation

## 2019-10-08 DIAGNOSIS — S8991XA Unspecified injury of right lower leg, initial encounter: Secondary | ICD-10-CM | POA: Diagnosis present

## 2019-10-08 LAB — CBC WITH DIFFERENTIAL/PLATELET
Abs Immature Granulocytes: 0.02 10*3/uL (ref 0.00–0.07)
Basophils Absolute: 0.1 10*3/uL (ref 0.0–0.1)
Basophils Relative: 1 %
Eosinophils Absolute: 0.3 10*3/uL (ref 0.0–0.5)
Eosinophils Relative: 4 %
HCT: 35.2 % — ABNORMAL LOW (ref 39.0–52.0)
Hemoglobin: 11.5 g/dL — ABNORMAL LOW (ref 13.0–17.0)
Immature Granulocytes: 0 %
Lymphocytes Relative: 20 %
Lymphs Abs: 1.4 10*3/uL (ref 0.7–4.0)
MCH: 29.1 pg (ref 26.0–34.0)
MCHC: 32.7 g/dL (ref 30.0–36.0)
MCV: 89.1 fL (ref 80.0–100.0)
Monocytes Absolute: 0.8 10*3/uL (ref 0.1–1.0)
Monocytes Relative: 11 %
Neutro Abs: 4.5 10*3/uL (ref 1.7–7.7)
Neutrophils Relative %: 64 %
Platelets: 254 10*3/uL (ref 150–400)
RBC: 3.95 MIL/uL — ABNORMAL LOW (ref 4.22–5.81)
RDW: 13.4 % (ref 11.5–15.5)
WBC: 7 10*3/uL (ref 4.0–10.5)
nRBC: 0 % (ref 0.0–0.2)

## 2019-10-08 LAB — COMPREHENSIVE METABOLIC PANEL
ALT: 18 U/L (ref 0–44)
AST: 16 U/L (ref 15–41)
Albumin: 4 g/dL (ref 3.5–5.0)
Alkaline Phosphatase: 114 U/L (ref 38–126)
Anion gap: 10 (ref 5–15)
BUN: 17 mg/dL (ref 8–23)
CO2: 24 mmol/L (ref 22–32)
Calcium: 9 mg/dL (ref 8.9–10.3)
Chloride: 102 mmol/L (ref 98–111)
Creatinine, Ser: 1.29 mg/dL — ABNORMAL HIGH (ref 0.61–1.24)
GFR calc Af Amer: >60 mL/min (ref >60)
GFR calc non Af Amer: 59 mL/min — ABNORMAL LOW (ref >60)
Glucose, Bld: 296 mg/dL — ABNORMAL HIGH (ref 70–99)
Potassium: 4.4 mmol/L (ref 3.5–5.1)
Sodium: 136 mmol/L (ref 135–145)
Total Bilirubin: 0.1 mg/dL — ABNORMAL LOW (ref 0.3–1.2)
Total Protein: 7.3 g/dL (ref 6.5–8.1)

## 2019-10-08 LAB — TROPONIN I (HIGH SENSITIVITY): Troponin I (High Sensitivity): 5 ng/L (ref <18)

## 2019-10-08 LAB — D-DIMER, QUANTITATIVE: D-Dimer, Quant: 0.92 ug/mL-FEU — ABNORMAL HIGH (ref 0.00–0.50)

## 2019-10-08 NOTE — ED Triage Notes (Addendum)
Pt c/o right calf pain with injury x 1 day , also c/o SOB x 1 week

## 2019-10-09 ENCOUNTER — Encounter (HOSPITAL_BASED_OUTPATIENT_CLINIC_OR_DEPARTMENT_OTHER): Payer: Self-pay | Admitting: Radiology

## 2019-10-09 ENCOUNTER — Emergency Department (HOSPITAL_BASED_OUTPATIENT_CLINIC_OR_DEPARTMENT_OTHER): Payer: BLUE CROSS/BLUE SHIELD

## 2019-10-09 LAB — CBG MONITORING, ED: Glucose-Capillary: 234 mg/dL — ABNORMAL HIGH (ref 70–99)

## 2019-10-09 MED ORDER — IOHEXOL 350 MG/ML SOLN
100.0000 mL | Freq: Once | INTRAVENOUS | Status: AC | PRN
  Administered 2019-10-09: 100 mL via INTRAVENOUS

## 2019-10-09 MED ORDER — HEPARIN SOD (PORK) LOCK FLUSH 100 UNIT/ML IV SOLN
500.0000 [IU] | Freq: Once | INTRAVENOUS | Status: AC
  Administered 2019-10-09: 500 [IU]
  Filled 2019-10-09: qty 5

## 2019-10-09 MED ORDER — HYDROCODONE-ACETAMINOPHEN 5-325 MG PO TABS: 1.0000 | ORAL_TABLET | Freq: Four times a day (QID) | ORAL | 0 refills | Status: DC | PRN

## 2019-10-09 NOTE — ED Notes (Signed)
Patient transported to CT 

## 2019-10-09 NOTE — ED Notes (Signed)
ED Provider at bedside. 

## 2019-10-09 NOTE — ED Notes (Signed)
Attempted IV access with [place] without success. RT in to try for antecubital IV for CT scan.

## 2019-10-09 NOTE — ED Provider Notes (Signed)
[facility] Provider Note: Georgena Spurling, MD, FACEP  CSN: [id number] MRN: [id number] ARRIVAL: 10/08/19 at 2024 ROOM: [place]  Leg Injury and Shortness of Breath   HISTORY OF PRESENT ILLNESS  10/09/19 12:54 AM Travis [name] is a 64 y.o. male who was pushing a refrigerator up a hill 3 days ago and felt a pop in his right calf.  He has subsequently had pain in his mid right calf which she rates as a 7 out of 10.  It is worse with movement and palpation.  He has limited dorsiflexion of the right foot due to pain.  He is still able to ambulate.  He had bilateral lower extremity Dopplers 09/03/2019 which were negative for DVTs.  He also complains of having shortness of breath for the past week which is moderate.  He recently got his albuterol inhaler filled but this has not been adequately relieving his shortness of breath.  He is a lung cancer patient but states his cancer is in remission.   Past Medical History:  Diagnosis Date  . Arthritis   . Cancer ([place])    lung  . Chronic kidney disease   . Diabetes mellitus    TYPE II  . Dyspnea    at times  . Hypertension   . Pneumonia    "years ago"    Past Surgical History:  Procedure Laterality Date  . COLONOSCOPY W/ POLYPECTOMY    . IR IMAGING GUIDED PORT INSERTION  05/05/2018  . NASAL FRACTURE SURGERY    . VIDEO BRONCHOSCOPY WITH ENDOBRONCHIAL ULTRASOUND Right 04/20/2018   Procedure: VIDEO BRONCHOSCOPY WITH ENDOBRONCHIAL ULTRASOUND;  Surgeon: Juanito Doom, MD;  Location: [facility] OR;  Service: Thoracic;  Laterality: Right;    Family History  Problem Relation Age of Onset  . Leukemia Sister   . Cancer Brother   . Diabetes Mother     Social History   Tobacco Use  . Smoking status: Former Smoker    Packs/day: 1.00    Years: 45.00    Pack years: 45.00    Types: Cigarettes    Quit date: 03/31/2018    Years since quitting: 1.5  . Smokeless tobacco: Former Systems developer    Types: [place] date:  03/25/1973  Vaping Use  . Vaping Use: Never used  Substance Use Topics  . Alcohol use: Not Currently  . Drug use: No    Prior to Admission medications   Medication Sig Start Date End Date Taking? Authorizing Provider  albuterol (VENTOLIN HFA) 108 (90 Base) MCG/ACT inhaler Inhale 2 puffs into the lungs every 6 (six) hours as needed for wheezing or shortness of breath. 07/09/18   Tish Men, MD  amitriptyline (ELAVIL) 50 MG tablet Take 50 mg by mouth at bedtime.    [provider]  amLODipine (NORVASC) 5 MG tablet Take 5 mg by mouth daily. 04/10/18   [provider]  budesonide-formoterol (SYMBICORT) 160-4.5 MCG/ACT inhaler Inhale 2 puffs into the lungs 2 (two) times daily. 07/09/18   Tish Men, MD  DULoxetine (CYMBALTA) 30 MG capsule Take 30 mg by mouth daily. 09/13/19   [provider]  famotidine (PEPCID) 20 MG tablet Take 20 mg by mouth 2 (two) times daily. 03/11/19   [provider]  furosemide (LASIX) 40 MG tablet Take 40 mg by mouth 2 (two) times daily. 03/25/19   [provider]  gabapentin (NEURONTIN) 300 MG capsule Take 2 capsules by mouth twice daily Patient  taking differently: Take 300 mg by mouth 2 (two) times daily.  12/25/18   Tish Men, MD  glimepiride (AMARYL) 2 MG tablet Take 2 mg by mouth daily with breakfast.  08/01/18   [provider]  glucose blood test strip 4x a day 07/01/18   Shamleffer, Melanie Crazier, MD  HYDROcodone-acetaminophen (NORCO) 5-325 MG tablet Take 1 tablet by mouth every 6 (six) hours as needed for severe pain. 10/09/19   Shawniece Oyola, MD  insulin aspart (NOVOLOG) 100 UNIT/ML FlexPen Inject 6-18 Units into the skin See admin instructions. BS 180-200 add extra 2 units BS 201-250 add 5 units BS 251-300 add 7 units 301-350 add 10 units 351-400 add 12 units  bs >400 call PCP 03/25/19   [provider]  Insulin Glargine (LANTUS) 100 UNIT/ML Solostar Pen Inject 25 Units into the skin at bedtime. 03/22/19 04/21/19   Domenic Polite, MD  Insulin Pen Needle (BD PEN NEEDLE NANO U/F) 32G X 4 MM MISC 4x daily 07/01/18   Shamleffer, Melanie Crazier, MD  lidocaine (LIDODERM) 5 % Place 1 patch onto the skin daily. Remove & Discard patch within 12 hours or as directed by MD 06/12/19   Eustaquio Maize, PA-C  lidocaine (XYLOCAINE) 2 % solution Patient: Mix 1part 2% viscous lidocaine, 1part H20. Swish & swallow 32mL of diluted mixture, 25min before meals and at bedtime, up to QID Patient taking differently: Use as directed 15 mLs in the mouth or throat 4 (four) times daily as needed for mouth pain. Patient: Mix 1part 2% viscous lidocaine, 1part H20. Swish & swallow 65mL of diluted mixture, 96min before meals and at bedtime, up to QID 05/29/18   Tish Men, MD  losartan (COZAAR) 25 MG tablet Take 25 mg by mouth daily. 09/03/19   [provider]  magnesium oxide (MAG-OX) 400 (241.3 Mg) MG tablet Take 1 tablet by mouth 2 (two) times daily. 03/15/19   [provider]  metoprolol succinate (TOPROL-XL) 50 MG 24 hr tablet Take 50 mg by mouth daily. 03/13/19   [provider]  morphine (MSIR) 15 MG tablet Take 15 mg by mouth every 8 (eight) hours as needed for pain. 01/22/19   [provider]  pantoprazole (PROTONIX) 40 MG tablet Take 40 mg by mouth daily. 03/05/19   [provider]  polyethylene glycol (MIRALAX / GLYCOLAX) 17 g packet Take 17 g by mouth daily. 04/03/19   Alma Friendly, MD  potassium chloride (KLOR-CON) 10 MEQ tablet Take 10 mEq by mouth daily. 03/11/19   [provider]  rosuvastatin (CRESTOR) 10 MG tablet Take 10 mg by mouth daily. 09/03/19   [provider]  torsemide (DEMADEX) 20 MG tablet Take 1 tablet (20 mg total) by mouth 2 (two) times daily. 03/24/19   Domenic Polite, MD  traMADol (ULTRAM) 50 MG tablet Take 1 tablet (50 mg total) by mouth every 8 (eight) hours as needed for moderate pain. 09/04/18   Tish Men, MD  TRELEGY ELLIPTA 100-62.5-25  MCG/INH AEPB Inhale 1 puff into the lungs daily. 02/26/19   [provider]  prochlorperazine (COMPAZINE) 10 MG tablet Take 1 tablet (10 mg total) by mouth every 6 (six) hours as needed (Nausea or vomiting). 04/24/18 08/04/18  Tish Men, MD    Allergies Patient has no known allergies.   REVIEW OF SYSTEMS  Negative except as noted here or in the History of Present Illness.   PHYSICAL EXAMINATION  Initial Vital Signs Blood pressure (!) 146/78, pulse 81, temperature 98.4 F (  36.9 C), resp. rate (!) 24, height 5\' 10"  (1.778 m), weight 89.8 kg, SpO2 95 %.  Examination General: Well-developed, well-nourished male in no acute distress; appearance consistent with age of record HENT: normocephalic; atraumatic Eyes: pupils equal, round and reactive to light; extraocular muscles intact Neck: supple Heart: regular rate and rhythm Lungs: Mildly coarse sounds bilaterally Abdomen: soft; nondistended; nontender; bowel sounds present Extremities: No deformity; +1 edema of lower legs; tenderness of right mid calf with limited dorsiflexion, negative Thompson test Neurologic: Awake, alert and oriented; motor function intact in all extremities and symmetric; no facial droop Skin: Warm and dry Psychiatric: Normal mood and affect   RESULTS  Summary of this visit's results, reviewed and interpreted by myself:   EKG Interpretation  Date/Time:  Friday October 08 2019 20:43:15 EDT Ventricular Rate:  90 PR Interval:  166 QRS Duration: 90 QT Interval:  362 QTC Calculation: 442 R Axis:   77 Text Interpretation: Normal sinus rhythm Artifact No significant change was found Confirmed by Telecia Larocque, Jenny Reichmann [id number]) on 10/08/2019 10:47:54 PM      Laboratory Studies: Results for orders placed or performed during the hospital encounter of 10/08/19 (from the past 24 hour(s))  CBC with Differential     Status: Abnormal   Collection Time: 10/08/19  8:40 PM  Result Value Ref Range   WBC 7.0 4.0 - 10.5 K/uL    RBC 3.95 (L) 4.22 - 5.81 MIL/uL   Hemoglobin 11.5 (L) 13.0 - 17.0 g/dL   HCT 35.2 (L) 39 - 52 %   MCV 89.1 80.0 - 100.0 fL   MCH 29.1 26.0 - 34.0 pg   MCHC 32.7 30.0 - 36.0 g/dL   RDW 13.4 11.5 - 15.5 %   Platelets 254 150 - 400 K/uL   nRBC 0.0 0.0 - 0.2 %   Neutrophils Relative % 64 %   Neutro Abs 4.5 1.7 - 7.7 K/uL   Lymphocytes Relative 20 %   Lymphs Abs 1.4 0.7 - 4.0 K/uL   Monocytes Relative 11 %   Monocytes Absolute 0.8 0 - 1 K/uL   Eosinophils Relative 4 %   Eosinophils Absolute 0.3 0 - 0 K/uL   Basophils Relative 1 %   Basophils Absolute 0.1 0 - 0 K/uL   Immature Granulocytes 0 %   Abs Immature Granulocytes 0.02 0.00 - 0.07 K/uL  Comprehensive metabolic panel     Status: Abnormal   Collection Time: 10/08/19  8:40 PM  Result Value Ref Range   Sodium 136 135 - 145 mmol/L   Potassium 4.4 3.5 - 5.1 mmol/L   Chloride 102 98 - 111 mmol/L   CO2 24 22 - 32 mmol/L   Glucose, Bld 296 (H) 70 - 99 mg/dL   BUN 17 8 - 23 mg/dL   Creatinine, Ser 1.29 (H) 0.61 - 1.24 mg/dL   Calcium 9.0 8.9 - 10.3 mg/dL   Total Protein 7.3 6.5 - 8.1 g/dL   Albumin 4.0 3.5 - 5.0 g/dL   AST 16 15 - 41 U/L   ALT 18 0 - 44 U/L   Alkaline Phosphatase 114 38 - 126 U/L   Total Bilirubin 0.1 (L) 0.3 - 1.2 mg/dL   GFR calc non Af Amer 59 (L) >60 mL/min   GFR calc Af Amer >60 >60 mL/min   Anion gap 10 5 - 15  Troponin I (High Sensitivity)     Status: None   Collection Time: 10/08/19  8:40 PM  Result Value Ref Range  Troponin I (High Sensitivity) 5 <18 ng/L  D-dimer, quantitative (not at [facility])     Status: Abnormal   Collection Time: 10/08/19  8:40 PM  Result Value Ref Range   D-Dimer, Quant 0.92 (H) 0.00 - 0.50 ug/mL-FEU  CBG monitoring, ED     Status: Abnormal   Collection Time: 10/09/19  2:55 AM  Result Value Ref Range   Glucose-Capillary 234 (H) 70 - 99 mg/dL   Imaging Studies: DG Chest 2 View  Result Date: 10/08/2019 CLINICAL DATA:  64 year old male with shortness of breath. History of lung  cancer. EXAM: CHEST - 2 VIEW COMPARISON:  Chest radiograph dated 05/24/2019. FINDINGS: Left pectoral Port-A-Cath in similar position. Right hilar confluent and streaky densities which appears slightly more confluent compared to prior radiograph and corresponding to the consolidative changes seen on the prior CT primarily in the right lower lobe. The left lung is clear. No pneumothorax. No pleural effusion. The cardiac silhouette is within limits. Degenerative changes of spine. No acute osseous pathology. IMPRESSION: Right hilar consolidative changes similar or slightly more confluent compared to the prior radiograph. Electronically Signed   By: Anner Crete M.D.   On: 10/08/2019 21:22   CT Angio Chest PE W and/or Wo Contrast  Result Date: 10/09/2019 CLINICAL DATA:  64 year old male with shortness of breath. Elevated D-dimer. History of lung cancer. EXAM: CT ANGIOGRAPHY CHEST WITH CONTRAST TECHNIQUE: Multidetector CT imaging of the chest was performed using the standard protocol during bolus administration of intravenous contrast. Multiplanar CT image reconstructions and MIPs were obtained to evaluate the vascular anatomy. CONTRAST:  182mL OMNIPAQUE IOHEXOL 350 MG/ML SOLN COMPARISON:  Chest radiograph dated 10/08/2019. FINDINGS: Cardiovascular: There is no cardiomegaly or pericardial effusion. Mild atherosclerotic calcification of the thoracic aorta. No aneurysmal dilatation or dissection. The origins of the great vessels of the aortic arch appear patent as visualized. Evaluation of the pulmonary arteries is limited due to respiratory motion artifact. No pulmonary artery embolus identified. Mediastinum/Nodes: Top-normal anterior mediastinal lymph node in the prevascular space measures 10 mm in short axis minimally increased in size since the prior CT. Additional right paratracheal lymph node measures 9 mm short axis, increased in size since the prior CT. Evaluation of the right hilar lymph node is limited due  to consolidative changes of the adjacent lung. The esophagus is grossly unremarkable. No mediastinal fluid collection. A Port-A-Cath is seen with tip in the region of the cavoatrial junction. Lungs/Pleura: There is consolidative changes of the superior segment of the right lower lobe as well as right upper lobe/suprahilar patchy and streaky densities, progressed compared to the prior CT. There is a 4 mm right upper lobe nodule (28/5) similar to prior CT. Additional 4 mm nodule along the left fissure noted (53/5) similar to prior CT. There is no pleural effusion or pneumothorax. The central airways are patent. Upper Abdomen: No acute abnormality. Musculoskeletal: Degenerative changes of the spine. No acute osseous pathology. Review of the MIP images confirms the above findings. IMPRESSION: 1. No CT evidence of pulmonary embolism. 2. Progression of the right hilar and right lung masslike densities since the prior CT as well as slight interval increase in several mediastinal lymph nodes concerning for progression of disease. Clinical correlation is recommended. 3. Aortic Atherosclerosis (ICD10-I70.0). Electronically Signed   By: Anner Crete M.D.   On: 10/09/2019 03:36    ED COURSE and MDM  Nursing notes, initial and subsequent vitals signs, including pulse oximetry, reviewed and interpreted by myself.  Vitals:   10/08/19  2035 10/08/19 2342 10/09/19 0015 10/09/19 0100  BP: (!) 158/80 (!) 144/81 (!) 146/78 (!) 158/80  Pulse: 93 82 81 78  Resp: 18 18 (!) 24 (!) 22  Temp: 98 F (36.7 C)  98.4 F (36.9 C)   SpO2: 92% 96% 95% 100%  Weight:      Height:       Medications  iohexol (OMNIPAQUE) 350 MG/ML injection 100 mL (100 mLs Intravenous Contrast Given 10/09/19 0253)   The patient's calf injury is consistent with rupture of the plantaris longus tendon which causes pain but usually no functional deficit.  The patient was advised that this will resolve on its own although may he may have pain in the  meantime as it heals.  He was advised of the CT findings suggesting increased size of the cancer.  He will need follow-up with his oncologist/pulmonologist.   PROCEDURES  Procedures   ED DIAGNOSES     ICD-10-CM   1. Rupture of right plantaris tendon, initial encounter  X45.038U   2. Shortness of breath  R06.02        Shanon Rosser, MD 10/09/19 [phone]

## 2019-10-09 NOTE — Discharge Instructions (Signed)
Your calf pain is likely due to rupture of the plantaris tendon.  This tendon is not important in humans (it is an important muscle in horses) and you should not suffer any permanent disability once it is healed.  You may find that rest and elevation will help with your symptoms.

## 2019-10-09 NOTE — ED Notes (Signed)
Attempted IV insertion X 2 with ultrasound. Not able to cannulate and thread vein successfully. Patient tolerated well.

## 2020-03-02 ENCOUNTER — Emergency Department (HOSPITAL_BASED_OUTPATIENT_CLINIC_OR_DEPARTMENT_OTHER)
Admission: EM | Admit: 2020-03-02 | Discharge: 2020-03-02 | Disposition: A | Discharge status: Home / Self Care | Payer: Medicare Other | Attending: Emergency Medicine | Admitting: Emergency Medicine

## 2020-03-02 ENCOUNTER — Other Ambulatory Visit: Payer: Self-pay

## 2020-03-02 ENCOUNTER — Emergency Department (HOSPITAL_BASED_OUTPATIENT_CLINIC_OR_DEPARTMENT_OTHER): Payer: Medicare Other

## 2020-03-02 ENCOUNTER — Encounter (HOSPITAL_BASED_OUTPATIENT_CLINIC_OR_DEPARTMENT_OTHER): Payer: Self-pay

## 2020-03-02 DIAGNOSIS — E1122 Type 2 diabetes mellitus with diabetic chronic kidney disease: Secondary | ICD-10-CM | POA: Diagnosis not present

## 2020-03-02 DIAGNOSIS — N189 Chronic kidney disease, unspecified: Secondary | ICD-10-CM | POA: Insufficient documentation

## 2020-03-02 DIAGNOSIS — Z7984 Long term (current) use of oral hypoglycemic drugs: Secondary | ICD-10-CM | POA: Insufficient documentation

## 2020-03-02 DIAGNOSIS — Z87891 Personal history of nicotine dependence: Secondary | ICD-10-CM | POA: Diagnosis not present

## 2020-03-02 DIAGNOSIS — B349 Viral infection, unspecified: Secondary | ICD-10-CM | POA: Insufficient documentation

## 2020-03-02 DIAGNOSIS — Z7951 Long term (current) use of inhaled steroids: Secondary | ICD-10-CM | POA: Insufficient documentation

## 2020-03-02 DIAGNOSIS — Z85118 Personal history of other malignant neoplasm of bronchus and lung: Secondary | ICD-10-CM | POA: Diagnosis not present

## 2020-03-02 DIAGNOSIS — M25562 Pain in left knee: Secondary | ICD-10-CM | POA: Diagnosis not present

## 2020-03-02 DIAGNOSIS — E1140 Type 2 diabetes mellitus with diabetic neuropathy, unspecified: Secondary | ICD-10-CM | POA: Insufficient documentation

## 2020-03-02 DIAGNOSIS — R0602 Shortness of breath: Secondary | ICD-10-CM | POA: Diagnosis present

## 2020-03-02 DIAGNOSIS — G8929 Other chronic pain: Secondary | ICD-10-CM | POA: Diagnosis not present

## 2020-03-02 DIAGNOSIS — E1165 Type 2 diabetes mellitus with hyperglycemia: Secondary | ICD-10-CM | POA: Insufficient documentation

## 2020-03-02 DIAGNOSIS — J441 Chronic obstructive pulmonary disease with (acute) exacerbation: Secondary | ICD-10-CM | POA: Diagnosis not present

## 2020-03-02 DIAGNOSIS — Z794 Long term (current) use of insulin: Secondary | ICD-10-CM | POA: Insufficient documentation

## 2020-03-02 DIAGNOSIS — Z79899 Other long term (current) drug therapy: Secondary | ICD-10-CM | POA: Insufficient documentation

## 2020-03-02 DIAGNOSIS — Z20822 Contact with and (suspected) exposure to covid-19: Secondary | ICD-10-CM | POA: Diagnosis not present

## 2020-03-02 DIAGNOSIS — I129 Hypertensive chronic kidney disease with stage 1 through stage 4 chronic kidney disease, or unspecified chronic kidney disease: Secondary | ICD-10-CM | POA: Insufficient documentation

## 2020-03-02 LAB — CBC WITH DIFFERENTIAL/PLATELET
Abs Immature Granulocytes: 0.03 10*3/uL (ref 0.00–0.07)
Basophils Absolute: 0 10*3/uL (ref 0.0–0.1)
Basophils Relative: 0 %
Eosinophils Absolute: 0.1 10*3/uL (ref 0.0–0.5)
Eosinophils Relative: 2 %
HCT: 37.8 % — ABNORMAL LOW (ref 39.0–52.0)
Hemoglobin: 12.2 g/dL — ABNORMAL LOW (ref 13.0–17.0)
Immature Granulocytes: 0 %
Lymphocytes Relative: 14 %
Lymphs Abs: 1 10*3/uL (ref 0.7–4.0)
MCH: 28.6 pg (ref 26.0–34.0)
MCHC: 32.3 g/dL (ref 30.0–36.0)
MCV: 88.7 fL (ref 80.0–100.0)
Monocytes Absolute: 1 10*3/uL (ref 0.1–1.0)
Monocytes Relative: 13 %
Neutro Abs: 5.1 10*3/uL (ref 1.7–7.7)
Neutrophils Relative %: 71 %
Platelets: 242 10*3/uL (ref 150–400)
RBC: 4.26 MIL/uL (ref 4.22–5.81)
RDW: 14.2 % (ref 11.5–15.5)
Smear Review: NORMAL
WBC: 7.2 10*3/uL (ref 4.0–10.5)
nRBC: 0 % (ref 0.0–0.2)

## 2020-03-02 LAB — COMPREHENSIVE METABOLIC PANEL
ALT: 15 U/L (ref 0–44)
AST: 12 U/L — ABNORMAL LOW (ref 15–41)
Albumin: 3.6 g/dL (ref 3.5–5.0)
Alkaline Phosphatase: 80 U/L (ref 38–126)
Anion gap: 10 (ref 5–15)
BUN: 24 mg/dL — ABNORMAL HIGH (ref 8–23)
CO2: 21 mmol/L — ABNORMAL LOW (ref 22–32)
Calcium: 8.9 mg/dL (ref 8.9–10.3)
Chloride: 104 mmol/L (ref 98–111)
Creatinine, Ser: 1.41 mg/dL — ABNORMAL HIGH (ref 0.61–1.24)
GFR, Estimated: 56 mL/min — ABNORMAL LOW (ref >60)
Glucose, Bld: 306 mg/dL — ABNORMAL HIGH (ref 70–99)
Potassium: 4.3 mmol/L (ref 3.5–5.1)
Sodium: 135 mmol/L (ref 135–145)
Total Bilirubin: 0.6 mg/dL (ref 0.3–1.2)
Total Protein: 6.9 g/dL (ref 6.5–8.1)

## 2020-03-02 LAB — RESP PANEL BY RT-PCR (FLU A&B, COVID) ARPGX2
Influenza A by PCR: NEGATIVE
Influenza B by PCR: NEGATIVE
SARS Coronavirus 2 by RT PCR: NEGATIVE

## 2020-03-02 LAB — TROPONIN I (HIGH SENSITIVITY)
Troponin I (High Sensitivity): 3 ng/L (ref <18)
Troponin I (High Sensitivity): 3 ng/L (ref <18)

## 2020-03-02 MED ORDER — ONDANSETRON HCL 4 MG/2ML IJ SOLN
4.0000 mg | Freq: Once | INTRAMUSCULAR | Status: AC
  Administered 2020-03-02: 4 mg via INTRAVENOUS
  Filled 2020-03-02: qty 2

## 2020-03-02 MED ORDER — OXYCODONE-ACETAMINOPHEN 5-325 MG PO TABS: 1.0000 | ORAL_TABLET | ORAL | 0 refills | Status: AC | PRN

## 2020-03-02 MED ORDER — SODIUM CHLORIDE 0.9 % IV BOLUS
1000.0000 mL | Freq: Once | INTRAVENOUS | Status: AC
  Administered 2020-03-02: 1000 mL via INTRAVENOUS

## 2020-03-02 MED ORDER — HEPARIN SOD (PORK) LOCK FLUSH 100 UNIT/ML IV SOLN
500.0000 [IU] | Freq: Once | INTRAVENOUS | Status: AC
  Administered 2020-03-02: 500 [IU]
  Filled 2020-03-02: qty 5

## 2020-03-02 MED ORDER — ONDANSETRON 4 MG PO TBDP: 4.0000 mg | ORAL_TABLET | Freq: Three times a day (TID) | ORAL | 0 refills | Status: DC | PRN

## 2020-03-02 NOTE — Discharge Instructions (Signed)
Take Zofran as needed for nausea.  Take Percocet as needed for flareups of your knee pain.  Note this can make you drowsy and should not be taken while driving or operating heavy machinery.  For your symptoms today, please follow-up with your primary doctor.  Ideally to be seen early next week.  Keep appointment with your oncologist.  If you develop any worsening shortness of breath, chest pain, vomiting or other new concerning symptom, return to ER for reassessment.

## 2020-03-02 NOTE — ED Notes (Signed)
Pt also complaining of left knee pain after a fall 3 months ago, wants that evaluated by provider. Pt stating he is having left shoulder pain as well, which is the same pain he had in his right shoulder when he was diagnosed with lung cancer.

## 2020-03-02 NOTE — ED Notes (Signed)
Pt tolerating PO fluids/crackers

## 2020-03-02 NOTE — ED Triage Notes (Addendum)
Pt states has been SOB, diarrhea/vomiting, intermittent cough and states has been losing his taste since Monday. States he can drink fluids, but unable to eat. States abdominal soreness. Has received 2 Pfizer vaccines. Hx of lung cancer, in remission for about a year and a half per pt.

## 2020-03-02 NOTE — ED Notes (Addendum)
Ambulated from lobby to room 12.  SpO2 96-100%, HR 105-115, +DOE.

## 2020-03-02 NOTE — ED Notes (Signed)
Pt provided with ginger ale and crackers for PO challenge

## 2020-03-02 NOTE — ED Provider Notes (Addendum)
[facility] EMERGENCY DEPARTMENT Provider Note   CSN: [id number] Arrival date & time: 03/02/20  3151     History Chief Complaint  Patient presents with  . Shortness of Breath    Travis [name] is a 64 y.o. male.  Presents to ER with concern for mood symptoms.  Patient reports over the last few days, since Monday he has been feeling generally ill, fatigued.  Has had intermittent dry cough, occasional episode of loose stools and vomiting with nausea.  No blood in stool or blood in vomit.  Denies abdominal pain.  Has been able to drink fluids but no appetite for solids.  Endorses some shortness of breath, denies any chest pain, denies leg swelling or generalized leg pain.  Patient reports secondary complaint of left knee pain.  He reports that his knee has been bothering him over the last few months.  No significant changes today, no falls, no joint swelling.  History of lung cancer, patient reports he has been in remission.  Has been vaccinated with COVID-19.  HPI     Past Medical History:  Diagnosis Date  . Arthritis   . Cancer ([place])    lung  . Chronic kidney disease   . Diabetes mellitus    TYPE II  . Dyspnea    at times  . Hypertension   . Pneumonia    "years ago"    Patient Active Problem List   Diagnosis Date Noted  . Acute respiratory failure with hypoxia ([place]) 03/31/2019  . Hyperglycemia 03/21/2019  . AKI (acute kidney injury) ([place]) 03/21/2019  . ARF (acute renal failure) ([place]) 03/20/2019  . Exertional dyspnea 09/04/2018  . Leukopenia due to antineoplastic chemotherapy ([place]) 06/05/2018  . Diabetes ([place]) 05/15/2018  . Diabetic neuropathy ([place]) 05/08/2018  . 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 COPD (chronic obstructive pulmonary disease) ([place]) 04/24/2018  . Right lower lobe lung mass   . Anemia due to antineoplastic chemotherapy 04/06/2018  . Lung cancer ([place]) 04/03/2018     Past Surgical History:  Procedure Laterality Date  . COLONOSCOPY W/ POLYPECTOMY    . IR IMAGING GUIDED PORT INSERTION  05/05/2018  . NASAL FRACTURE SURGERY    . VIDEO BRONCHOSCOPY WITH ENDOBRONCHIAL ULTRASOUND Right 04/20/2018   Procedure: VIDEO BRONCHOSCOPY WITH ENDOBRONCHIAL ULTRASOUND;  Surgeon: Juanito Doom, MD;  Location: [facility] OR;  Service: Thoracic;  Laterality: Right;       Family History  Problem Relation Age of Onset  . Leukemia Sister   . Cancer Brother   . Diabetes Mother     Social History   Tobacco Use  . Smoking status: Former Smoker    Packs/day: 1.00    Years: 45.00    Pack years: 45.00    Types: Cigarettes    Quit date: 03/31/2018    Years since quitting: 1.9  . Smokeless tobacco: Former Systems developer    Types: [place] date: 03/25/1973  Vaping Use  . Vaping Use: Never used  Substance Use Topics  . Alcohol use: Not Currently  . Drug use: No    Home Medications Prior to Admission medications   Medication Sig Start Date End Date Taking? Authorizing Provider  albuterol (VENTOLIN HFA) 108 (90 Base) MCG/ACT inhaler Inhale 2 puffs into the lungs every 6 (six) hours as needed for wheezing or shortness of breath. 07/09/18   Tish Men, MD  amitriptyline (ELAVIL) 50 MG tablet Take 50 mg  by mouth at bedtime.    [provider]  amLODipine (NORVASC) 5 MG tablet Take 5 mg by mouth daily. 04/10/18   [provider]  budesonide-formoterol (SYMBICORT) 160-4.5 MCG/ACT inhaler Inhale 2 puffs into the lungs 2 (two) times daily. 07/09/18   Tish Men, MD  DULoxetine (CYMBALTA) 30 MG capsule Take 30 mg by mouth daily. 09/13/19   [provider]  famotidine (PEPCID) 20 MG tablet Take 20 mg by mouth 2 (two) times daily. 03/11/19   [provider]  furosemide (LASIX) 40 MG tablet Take 40 mg by mouth 2 (two) times daily. 03/25/19   [provider]  gabapentin (NEURONTIN) 300 MG capsule Take 2 capsules by mouth twice daily Patient taking  differently: Take 300 mg by mouth 2 (two) times daily.  12/25/18   Tish Men, MD  glimepiride (AMARYL) 2 MG tablet Take 2 mg by mouth daily with breakfast.  08/01/18   [provider]  glucose blood test strip 4x a day 07/01/18   Shamleffer, Melanie Crazier, MD  HYDROcodone-acetaminophen (NORCO) 5-325 MG tablet Take 1 tablet by mouth every 6 (six) hours as needed for severe pain. 10/09/19   Molpus, John, MD  insulin aspart (NOVOLOG) 100 UNIT/ML FlexPen Inject 6-18 Units into the skin See admin instructions. BS 180-200 add extra 2 units BS 201-250 add 5 units BS 251-300 add 7 units 301-350 add 10 units 351-400 add 12 units  bs >400 call PCP 03/25/19   [provider]  Insulin Glargine (LANTUS) 100 UNIT/ML Solostar Pen Inject 25 Units into the skin at bedtime. 03/22/19 04/21/19  Domenic Polite, MD  Insulin Pen Needle (BD PEN NEEDLE NANO U/F) 32G X 4 MM MISC 4x daily 07/01/18   Shamleffer, Melanie Crazier, MD  lidocaine (LIDODERM) 5 % Place 1 patch onto the skin daily. Remove & Discard patch within 12 hours or as directed by MD 06/12/19   Eustaquio Maize, PA-C  lidocaine (XYLOCAINE) 2 % solution Patient: Mix 1part 2% viscous lidocaine, 1part H20. Swish & swallow 90mL of diluted mixture, 43min before meals and at bedtime, up to QID Patient taking differently: Use as directed 15 mLs in the mouth or throat 4 (four) times daily as needed for mouth pain. Patient: Mix 1part 2% viscous lidocaine, 1part H20. Swish & swallow 8mL of diluted mixture, 53min before meals and at bedtime, up to QID 05/29/18   Tish Men, MD  losartan (COZAAR) 25 MG tablet Take 25 mg by mouth daily. 09/03/19   [provider]  magnesium oxide (MAG-OX) 400 (241.3 Mg) MG tablet Take 1 tablet by mouth 2 (two) times daily. 03/15/19   [provider]  metoprolol succinate (TOPROL-XL) 50 MG 24 hr tablet Take 50 mg by mouth daily. 03/13/19   [provider]  morphine (MSIR) 15 MG tablet Take 15 mg by mouth every  8 (eight) hours as needed for pain. 01/22/19   [provider]  ondansetron (ZOFRAN ODT) 4 MG disintegrating tablet Take 1 tablet (4 mg total) by mouth every 8 (eight) hours as needed for nausea or vomiting. 03/02/20   Lucrezia Starch, MD  oxyCODONE-acetaminophen (PERCOCET/ROXICET) 5-325 MG tablet Take 1 tablet by mouth every 4 (four) hours as needed for up to 3 days for severe pain. 03/02/20 03/05/20  Lucrezia Starch, MD  pantoprazole (PROTONIX) 40 MG tablet Take 40 mg by mouth daily. 03/05/19   [provider]  polyethylene glycol (MIRALAX / GLYCOLAX) 17 g packet Take 17 g by mouth daily.  04/03/19   Alma Friendly, MD  potassium chloride (KLOR-CON) 10 MEQ tablet Take 10 mEq by mouth daily. 03/11/19   [provider]  rosuvastatin (CRESTOR) 10 MG tablet Take 10 mg by mouth daily. 09/03/19   [provider]  torsemide (DEMADEX) 20 MG tablet Take 1 tablet (20 mg total) by mouth 2 (two) times daily. 03/24/19   Domenic Polite, MD  traMADol (ULTRAM) 50 MG tablet Take 1 tablet (50 mg total) by mouth every 8 (eight) hours as needed for moderate pain. 09/04/18   Tish Men, MD  TRELEGY ELLIPTA 100-62.5-25 MCG/INH AEPB Inhale 1 puff into the lungs daily. 02/26/19   [provider]  prochlorperazine (COMPAZINE) 10 MG tablet Take 1 tablet (10 mg total) by mouth every 6 (six) hours as needed (Nausea or vomiting). 04/24/18 08/04/18  Tish Men, MD    Allergies    Patient has no known allergies.  Review of Systems   Review of Systems  Constitutional: Positive for chills and fatigue. Negative for fever.  HENT: Negative for ear pain and sore throat.   Eyes: Negative for pain and visual disturbance.  Respiratory: Positive for cough. Negative for shortness of breath.   Cardiovascular: Negative for chest pain and palpitations.  Gastrointestinal: Positive for diarrhea, nausea and vomiting. Negative for abdominal pain.  Genitourinary: Negative for dysuria and  hematuria.  Musculoskeletal: Negative for arthralgias and back pain.  Skin: Negative for color change and rash.  Neurological: Negative for seizures and syncope.  All other systems reviewed and are negative.   Physical Exam Updated Vital Signs BP 134/74   Pulse 95   Temp 97.8 F (36.6 C) (Oral)   Resp 17   Ht 5\' 10"  (1.778 m)   Wt 90.7 kg   SpO2 100%   BMI 28.70 kg/m   Physical Exam Vitals and nursing note reviewed.  Constitutional:      Appearance: He is well-developed and well-nourished.  HENT:     Head: Normocephalic and atraumatic.  Eyes:     Conjunctiva/sclera: Conjunctivae normal.  Cardiovascular:     Rate and Rhythm: Normal rate and regular rhythm.     Heart sounds: No murmur heard.   Pulmonary:     Effort: Pulmonary effort is normal. No respiratory distress.     Breath sounds: Normal breath sounds.  Chest:     Chest wall: No mass or deformity.  Abdominal:     Palpations: Abdomen is soft.     Tenderness: There is no abdominal tenderness. There is no guarding or rebound.  Musculoskeletal:        General: No edema.     Cervical back: Neck supple.     Comments: LLE: no deformity, edema, joint swelling noted throughout leg, knee appears normal, normal distal sensation and pulses intact  Skin:    General: Skin is warm and dry.  Neurological:     General: No focal deficit present.     Mental Status: He is alert.  Psychiatric:        Mood and Affect: Mood and affect and mood normal.        Behavior: Behavior normal.     ED Results / Procedures / Treatments   Labs (all labs ordered are listed, but only abnormal results are displayed) Labs Reviewed  CBC WITH DIFFERENTIAL/PLATELET - Abnormal; Notable for the following components:      Result Value   Hemoglobin 12.2 (*)    HCT 37.8 (*)    All other components within  normal limits  COMPREHENSIVE METABOLIC PANEL - Abnormal; Notable for the following components:   CO2 21 (*)    Glucose, Bld 306 (*)    BUN  24 (*)    Creatinine, Ser 1.41 (*)    AST 12 (*)    GFR, Estimated 56 (*)    All other components within normal limits  RESP PANEL BY RT-PCR (FLU A&B, COVID) [id number]  TROPONIN I (HIGH SENSITIVITY)  TROPONIN I (HIGH SENSITIVITY)    EKG None  03/02/20 1004: sinus tachycardia, rate 103 normal intervals, no acute ST segment or t wave inversions, no significant change when compared to prior in July 2021   Radiology DG Chest Portable 1 View  Result Date: 03/02/2020 CLINICAL DATA:  Cough, shortness of breath EXAM: PORTABLE CHEST 1 VIEW COMPARISON:  10/08/2019 FINDINGS: Left Port-A-Cath remains in place, unchanged. Heart is normal size. Right perihilar opacity is stable since prior study, corresponding to consolidative airspace disease seen on prior CT. Left lung is clear. No effusions or acute bony abnormality. IMPRESSION: Stable right perihilar consolidative opacity. No acute findings. Electronically Signed   By: Rolm Baptise M.D.   On: 03/02/2020 10:41   DG Knee Complete 4 Views Left  Result Date: 03/02/2020 CLINICAL DATA:  Left knee pain after fall 2 months ago. EXAM: LEFT KNEE - COMPLETE 4+ VIEW COMPARISON:  None. FINDINGS: No evidence of fracture, dislocation, or joint effusion. Mild narrowing of medial joint space is noted. Soft tissues are unremarkable. IMPRESSION: Mild degenerative joint disease. No acute abnormality seen in the left knee. Electronically Signed   By: Marijo Conception M.D.   On: 03/02/2020 12:08    Procedures Procedures (including critical care time)  Medications Ordered in ED Medications  sodium chloride 0.9 % bolus 1,000 mL (0 mLs Intravenous Stopped 03/02/20 1212)  ondansetron (ZOFRAN) injection 4 mg (4 mg Intravenous Given 03/02/20 1103)  heparin lock flush 100 unit/mL (500 Units Intracatheter Given 03/02/20 1410)    ED Course  I have reviewed the triage vital signs and the nursing notes.  Pertinent labs & imaging results that were available during my care of the  patient were reviewed by me and considered in my medical decision making (see chart for details).    MDM Rules/Calculators/A&P                         64 year old male presents to ER with concern for cough, malaise, nausea vomiting, diarrhea as well as chronic knee pain.  On exam patient noted to be well-appearing with stable vital signs.  CXR negative for acute pathology.  No pneumonia.  EKG without acute ischemic change, troponin within normal limits.  Doubt ACS.  His electrolytes were grossly within normal limits, creatinine slightly elevated from baseline.  Suspect mild dehydration.  Abdomen exam benign.  Suspect acute viral process most likely.  Covid and flu negative.  Provided patient with fluids, antiemetics.  Symptoms improved, tolerating p.o. without difficulty.  Regarding his knee pain, plain film was negative, he is ambulatory, recommend recheck with sports medicine clinic.  Stressed need for close follow-up with his primary doctor, additionally recommended keeping his appointment with his oncologist which he reports is next week.   After the discussed management above, the patient was determined to be safe for discharge.  The patient was in agreement with this plan and all questions regarding their care were answered.  ED return precautions were discussed and the patient will return to the ED  with any significant worsening of condition.  Final Clinical Impression(s) / ED Diagnoses Final diagnoses:  Viral syndrome  Chronic pain of left knee    Rx / DC Orders ED Discharge Orders         Ordered    oxyCODONE-acetaminophen (PERCOCET/ROXICET) 5-325 MG tablet  Every 4 hours PRN        03/02/20 1356    ondansetron (ZOFRAN ODT) 4 MG disintegrating tablet  Every 8 hours PRN        03/02/20 1357           Lucrezia Starch, MD 03/03/20 1518    Lucrezia Starch, MD 03/03/20 1521

## 2020-06-15 ENCOUNTER — Emergency Department (HOSPITAL_BASED_OUTPATIENT_CLINIC_OR_DEPARTMENT_OTHER): Payer: Medicare HMO

## 2020-06-15 ENCOUNTER — Emergency Department (HOSPITAL_BASED_OUTPATIENT_CLINIC_OR_DEPARTMENT_OTHER)
Admission: EM | Admit: 2020-06-15 | Discharge: 2020-06-15 | Disposition: A | Discharge status: Home / Self Care | Payer: Medicare HMO | Attending: Emergency Medicine | Admitting: Emergency Medicine

## 2020-06-15 ENCOUNTER — Encounter (HOSPITAL_BASED_OUTPATIENT_CLINIC_OR_DEPARTMENT_OTHER): Payer: Self-pay | Admitting: Emergency Medicine

## 2020-06-15 ENCOUNTER — Other Ambulatory Visit: Payer: Self-pay

## 2020-06-15 DIAGNOSIS — E1140 Type 2 diabetes mellitus with diabetic neuropathy, unspecified: Secondary | ICD-10-CM | POA: Diagnosis not present

## 2020-06-15 DIAGNOSIS — Z79899 Other long term (current) drug therapy: Secondary | ICD-10-CM | POA: Insufficient documentation

## 2020-06-15 DIAGNOSIS — W268XXA Contact with other sharp object(s), not elsewhere classified, initial encounter: Secondary | ICD-10-CM | POA: Diagnosis not present

## 2020-06-15 DIAGNOSIS — Z85118 Personal history of other malignant neoplasm of bronchus and lung: Secondary | ICD-10-CM | POA: Diagnosis not present

## 2020-06-15 DIAGNOSIS — R0602 Shortness of breath: Secondary | ICD-10-CM

## 2020-06-15 DIAGNOSIS — N189 Chronic kidney disease, unspecified: Secondary | ICD-10-CM | POA: Insufficient documentation

## 2020-06-15 DIAGNOSIS — J449 Chronic obstructive pulmonary disease, unspecified: Secondary | ICD-10-CM | POA: Insufficient documentation

## 2020-06-15 DIAGNOSIS — S61212A Laceration without foreign body of right middle finger without damage to nail, initial encounter: Secondary | ICD-10-CM | POA: Insufficient documentation

## 2020-06-15 DIAGNOSIS — R5383 Other fatigue: Secondary | ICD-10-CM | POA: Diagnosis not present

## 2020-06-15 DIAGNOSIS — E1122 Type 2 diabetes mellitus with diabetic chronic kidney disease: Secondary | ICD-10-CM | POA: Insufficient documentation

## 2020-06-15 DIAGNOSIS — R630 Anorexia: Secondary | ICD-10-CM | POA: Insufficient documentation

## 2020-06-15 DIAGNOSIS — Z20822 Contact with and (suspected) exposure to covid-19: Secondary | ICD-10-CM | POA: Insufficient documentation

## 2020-06-15 DIAGNOSIS — M7989 Other specified soft tissue disorders: Secondary | ICD-10-CM | POA: Insufficient documentation

## 2020-06-15 DIAGNOSIS — K429 Umbilical hernia without obstruction or gangrene: Secondary | ICD-10-CM | POA: Insufficient documentation

## 2020-06-15 DIAGNOSIS — I129 Hypertensive chronic kidney disease with stage 1 through stage 4 chronic kidney disease, or unspecified chronic kidney disease: Secondary | ICD-10-CM | POA: Diagnosis not present

## 2020-06-15 DIAGNOSIS — Z794 Long term (current) use of insulin: Secondary | ICD-10-CM | POA: Insufficient documentation

## 2020-06-15 DIAGNOSIS — R059 Cough, unspecified: Secondary | ICD-10-CM | POA: Diagnosis not present

## 2020-06-15 DIAGNOSIS — R000 Tachycardia, unspecified: Secondary | ICD-10-CM | POA: Insufficient documentation

## 2020-06-15 DIAGNOSIS — Z87891 Personal history of nicotine dependence: Secondary | ICD-10-CM | POA: Diagnosis not present

## 2020-06-15 DIAGNOSIS — T1490XA Injury, unspecified, initial encounter: Secondary | ICD-10-CM

## 2020-06-15 DIAGNOSIS — Y92009 Unspecified place in unspecified non-institutional (private) residence as the place of occurrence of the external cause: Secondary | ICD-10-CM | POA: Diagnosis not present

## 2020-06-15 DIAGNOSIS — Z7951 Long term (current) use of inhaled steroids: Secondary | ICD-10-CM | POA: Diagnosis not present

## 2020-06-15 DIAGNOSIS — S6991XA Unspecified injury of right wrist, hand and finger(s), initial encounter: Secondary | ICD-10-CM | POA: Diagnosis present

## 2020-06-15 LAB — COMPREHENSIVE METABOLIC PANEL
ALT: 24 U/L (ref 0–44)
AST: 20 U/L (ref 15–41)
Albumin: 3.7 g/dL (ref 3.5–5.0)
Alkaline Phosphatase: 91 U/L (ref 38–126)
Anion gap: 11 (ref 5–15)
BUN: 23 mg/dL (ref 8–23)
CO2: 17 mmol/L — ABNORMAL LOW (ref 22–32)
Calcium: 9.2 mg/dL (ref 8.9–10.3)
Chloride: 108 mmol/L (ref 98–111)
Creatinine, Ser: 1.38 mg/dL — ABNORMAL HIGH (ref 0.61–1.24)
GFR, Estimated: 57 mL/min — ABNORMAL LOW (ref >60)
Glucose, Bld: 299 mg/dL — ABNORMAL HIGH (ref 70–99)
Potassium: 4.3 mmol/L (ref 3.5–5.1)
Sodium: 136 mmol/L (ref 135–145)
Total Bilirubin: 0.4 mg/dL (ref 0.3–1.2)
Total Protein: 6.9 g/dL (ref 6.5–8.1)

## 2020-06-15 LAB — CBC
HCT: 37.9 % — ABNORMAL LOW (ref 39.0–52.0)
Hemoglobin: 12.5 g/dL — ABNORMAL LOW (ref 13.0–17.0)
MCH: 28.8 pg (ref 26.0–34.0)
MCHC: 33 g/dL (ref 30.0–36.0)
MCV: 87.3 fL (ref 80.0–100.0)
Platelets: 228 10*3/uL (ref 150–400)
RBC: 4.34 MIL/uL (ref 4.22–5.81)
RDW: 13.9 % (ref 11.5–15.5)
WBC: 8.1 10*3/uL (ref 4.0–10.5)
nRBC: 0 % (ref 0.0–0.2)

## 2020-06-15 LAB — LIPASE, BLOOD: Lipase: 44 U/L (ref 11–51)

## 2020-06-15 LAB — URINALYSIS, ROUTINE W REFLEX MICROSCOPIC
Bilirubin Urine: NEGATIVE
Glucose, UA: >=500 mg/dL — AB
Ketones, ur: NEGATIVE mg/dL
Leukocytes,Ua: NEGATIVE
Nitrite: NEGATIVE
Protein, ur: 100 mg/dL — AB
Specific Gravity, Urine: 1.025 (ref 1.005–1.030)
pH: 5.5 (ref 5.0–8.0)

## 2020-06-15 LAB — RESP PANEL BY RT-PCR (FLU A&B, COVID) ARPGX2
Influenza A by PCR: NEGATIVE
Influenza B by PCR: NEGATIVE
SARS Coronavirus 2 by RT PCR: NEGATIVE

## 2020-06-15 LAB — URINALYSIS, MICROSCOPIC (REFLEX)

## 2020-06-15 LAB — TROPONIN I (HIGH SENSITIVITY)
Troponin I (High Sensitivity): 5 ng/L (ref <18)
Troponin I (High Sensitivity): 5 ng/L (ref <18)

## 2020-06-15 LAB — BRAIN NATRIURETIC PEPTIDE: B Natriuretic Peptide: 37.7 pg/mL (ref 0.0–100.0)

## 2020-06-15 MED ORDER — ALBUTEROL SULFATE HFA 108 (90 BASE) MCG/ACT IN AERS
2.0000 | INHALATION_SPRAY | Freq: Once | RESPIRATORY_TRACT | Status: AC
  Administered 2020-06-15: 2 via RESPIRATORY_TRACT
  Filled 2020-06-15: qty 6.7

## 2020-06-15 MED ORDER — IOHEXOL 350 MG/ML SOLN
100.0000 mL | Freq: Once | INTRAVENOUS | Status: AC | PRN
  Administered 2020-06-15: 100 mL via INTRAVENOUS

## 2020-06-15 MED ORDER — CEPHALEXIN 500 MG PO CAPS: 500.0000 mg | ORAL_CAPSULE | Freq: Two times a day (BID) | ORAL | 0 refills | Status: AC

## 2020-06-15 MED ORDER — LIDOCAINE HCL (PF) 1 % IJ SOLN
10.0000 mL | Freq: Once | INTRAMUSCULAR | Status: DC
  Filled 2020-06-15: qty 10

## 2020-06-15 MED ORDER — CEPHALEXIN 250 MG PO CAPS
500.0000 mg | ORAL_CAPSULE | Freq: Once | ORAL | Status: AC
  Administered 2020-06-15: 500 mg via ORAL
  Filled 2020-06-15: qty 2

## 2020-06-15 MED ORDER — AEROCHAMBER Z-STAT PLUS/MEDIUM MISC
1.0000 | Freq: Once | Status: DC
  Filled 2020-06-15: qty 1

## 2020-06-15 NOTE — ED Notes (Signed)
ED Provider at bedside. 

## 2020-06-15 NOTE — ED Notes (Signed)
Presents with Shortness of Breath, onset two weeks ago, denies any fever, intermittent non-prod cough. Placed on cont cardiac monitoring, NSR intermittent ST, no ectopy noted. Resp noted on monitor at 24/min with RA POX at 95%. Pt is alert and oriented, MAE x 4.

## 2020-06-15 NOTE — ED Notes (Signed)
Patient transported to CT 

## 2020-06-15 NOTE — ED Notes (Signed)
Laceration to right hand, knuckle to middle finger. No bleeding at this time

## 2020-06-15 NOTE — ED Triage Notes (Addendum)
Reports having SOB for the last two weeks.  Hx of lung ca.  Reports currently in remission.  Also reports pain across abdomen if he coughs.  Also has cut to right middle finger cutting shingles today.bleeding controlled in triage.

## 2020-06-15 NOTE — ED Notes (Signed)
Also c/o abd pain due to having shortness of breath per pt statement

## 2020-06-15 NOTE — ED Provider Notes (Signed)
[facility] EMERGENCY DEPARTMENT Provider Note   CSN: [id number] Arrival date & time: 06/15/20  1454     History Chief Complaint  Patient presents with  . Shortness of Breath    Travis [name] is a 65 y.o. male with history of lung cancer presents with 2 weeks of progressively worsening shortness of breath.  Patient appears short of breath even at rest while speaking to this provider.  He endorses that it is significantly worse with any even minimal activity such as walking to the restroom.  He has intermittent very rare nonproductive cough.  Additionally endorses weight gain in the last month, he is unable to quantify it, however states that feels his abdomen is more protuberant than normal and he has swelling in his bottom eyelids and ankles bilaterally that is worse than normal for him.  He endorses right-sided chest pain that does not radiate anywhere, but denies palpitations.  He denies nausea or vomiting, diarrhea, fevers, chills at home.  Does endorse significant fatigue and anorexia today.  Endorses generalized abdominal pain progressively worsening over the last month.  Denies dysuria, urinary frequency or urgency.  Denies melena, hematochezia, constipation.  Additionally patient sustained a laceration to the right dorsal middle finger when he brushed up against a piece of sharp metal at his home.  He is up-to-date on his tetanus vaccine.  Hemostatic at this time.  I personally reviewed this patient's medical records.  He has history of hypertension, type 2 diabetes, CKD, lung cancer with completed treatment approximately 2 years ago, and COPD.  HPI     Past Medical History:  Diagnosis Date  . Arthritis   . Cancer ([place])    lung  . Chronic kidney disease   . Diabetes mellitus    TYPE II  . Dyspnea    at times  . Hypertension   . Pneumonia    "years ago"    Patient Active Problem List   Diagnosis Date Noted  . Acute respiratory failure with hypoxia ([place])  03/31/2019  . Hyperglycemia 03/21/2019  . AKI (acute kidney injury) ([place]) 03/21/2019  . ARF (acute renal failure) ([place]) 03/20/2019  . Exertional dyspnea 09/04/2018  . Leukopenia due to antineoplastic chemotherapy ([place]) 06/05/2018  . Diabetes ([place]) 05/15/2018  . Diabetic neuropathy ([place]) 05/08/2018  . 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 COPD (chronic obstructive pulmonary disease) ([place]) 04/24/2018  . Right lower lobe lung mass   . Anemia due to antineoplastic chemotherapy 04/06/2018  . Lung cancer ([place]) 04/03/2018    Past Surgical History:  Procedure Laterality Date  . COLONOSCOPY W/ POLYPECTOMY    . IR IMAGING GUIDED PORT INSERTION  05/05/2018  . NASAL FRACTURE SURGERY    . VIDEO BRONCHOSCOPY WITH ENDOBRONCHIAL ULTRASOUND Right 04/20/2018   Procedure: VIDEO BRONCHOSCOPY WITH ENDOBRONCHIAL ULTRASOUND;  Surgeon: Juanito Doom, MD;  Location: [facility] OR;  Service: Thoracic;  Laterality: Right;       Family History  Problem Relation Age of Onset  . Leukemia Sister   . Cancer Brother   . Diabetes Mother     Social History   Tobacco Use  . Smoking status: Former Smoker    Packs/day: 1.00    Years: 45.00    Pack years: 45.00    Types: Cigarettes    Quit date: 03/31/2018    Years since quitting: 2.2  . Smokeless tobacco: Former Systems developer    Types: Risk analyst  date: 03/25/1973  Vaping Use  . Vaping Use: Never used  Substance Use Topics  . Alcohol use: Not Currently  . Drug use: No    Home Medications Prior to Admission medications   Medication Sig Start Date End Date Taking? Authorizing Provider  cephALEXin (KEFLEX) 500 MG capsule Take 1 capsule (500 mg total) by mouth 2 (two) times daily for 5 days. 06/15/20 06/20/20 Yes Henrietta Cieslewicz R, PA-C  albuterol (VENTOLIN HFA) 108 (90 Base) MCG/ACT inhaler Inhale 2 puffs into the lungs every 6 (six) hours as needed for wheezing or shortness of breath. 07/09/18    Tish Men, MD  amitriptyline (ELAVIL) 50 MG tablet Take 50 mg by mouth at bedtime.    [provider]  amLODipine (NORVASC) 5 MG tablet Take 5 mg by mouth daily. 04/10/18   [provider]  budesonide-formoterol (SYMBICORT) 160-4.5 MCG/ACT inhaler Inhale 2 puffs into the lungs 2 (two) times daily. 07/09/18   Tish Men, MD  DULoxetine (CYMBALTA) 30 MG capsule Take 30 mg by mouth daily. 09/13/19   [provider]  famotidine (PEPCID) 20 MG tablet Take 20 mg by mouth 2 (two) times daily. 03/11/19   [provider]  furosemide (LASIX) 40 MG tablet Take 40 mg by mouth 2 (two) times daily. 03/25/19   [provider]  gabapentin (NEURONTIN) 300 MG capsule Take 2 capsules by mouth twice daily Patient taking differently: Take 300 mg by mouth 2 (two) times daily.  12/25/18   Tish Men, MD  glimepiride (AMARYL) 2 MG tablet Take 2 mg by mouth daily with breakfast.  08/01/18   [provider]  glucose blood test strip 4x a day 07/01/18   Shamleffer, Melanie Crazier, MD  HYDROcodone-acetaminophen (NORCO) 5-325 MG tablet Take 1 tablet by mouth every 6 (six) hours as needed for severe pain. 10/09/19   Molpus, John, MD  insulin aspart (NOVOLOG) 100 UNIT/ML FlexPen Inject 6-18 Units into the skin See admin instructions. BS 180-200 add extra 2 units BS 201-250 add 5 units BS 251-300 add 7 units 301-350 add 10 units 351-400 add 12 units  bs >400 call PCP 03/25/19   [provider]  Insulin Glargine (LANTUS) 100 UNIT/ML Solostar Pen Inject 25 Units into the skin at bedtime. 03/22/19 04/21/19  Domenic Polite, MD  Insulin Pen Needle (BD PEN NEEDLE NANO U/F) 32G X 4 MM MISC 4x daily 07/01/18   Shamleffer, Melanie Crazier, MD  lidocaine (LIDODERM) 5 % Place 1 patch onto the skin daily. Remove & Discard patch within 12 hours or as directed by MD 06/12/19   Eustaquio Maize, PA-C  lidocaine (XYLOCAINE) 2 % solution Patient: Mix 1part 2% viscous lidocaine, 1part H20. Swish &  swallow 20mL of diluted mixture, 73min before meals and at bedtime, up to QID Patient taking differently: Use as directed 15 mLs in the mouth or throat 4 (four) times daily as needed for mouth pain. Patient: Mix 1part 2% viscous lidocaine, 1part H20. Swish & swallow 3mL of diluted mixture, 52min before meals and at bedtime, up to QID 05/29/18   Tish Men, MD  losartan (COZAAR) 25 MG tablet Take 25 mg by mouth daily. 09/03/19   [provider]  magnesium oxide (MAG-OX) 400 (241.3 Mg) MG tablet Take 1 tablet by mouth 2 (two) times daily. 03/15/19   [provider]  metoprolol succinate (TOPROL-XL) 50 MG 24 hr tablet Take 50 mg by mouth daily. 03/13/19   [provider]  morphine (MSIR) 15 MG tablet Take  15 mg by mouth every 8 (eight) hours as needed for pain. 01/22/19   [provider]  ondansetron (ZOFRAN ODT) 4 MG disintegrating tablet Take 1 tablet (4 mg total) by mouth every 8 (eight) hours as needed for nausea or vomiting. 03/02/20   Lucrezia Starch, MD  pantoprazole (PROTONIX) 40 MG tablet Take 40 mg by mouth daily. 03/05/19   [provider]  polyethylene glycol (MIRALAX / GLYCOLAX) 17 g packet Take 17 g by mouth daily. 04/03/19   Alma Friendly, MD  potassium chloride (KLOR-CON) 10 MEQ tablet Take 10 mEq by mouth daily. 03/11/19   [provider]  rosuvastatin (CRESTOR) 10 MG tablet Take 10 mg by mouth daily. 09/03/19   [provider]  torsemide (DEMADEX) 20 MG tablet Take 1 tablet (20 mg total) by mouth 2 (two) times daily. 03/24/19   Domenic Polite, MD  traMADol (ULTRAM) 50 MG tablet Take 1 tablet (50 mg total) by mouth every 8 (eight) hours as needed for moderate pain. 09/04/18   Tish Men, MD  TRELEGY ELLIPTA 100-62.5-25 MCG/INH AEPB Inhale 1 puff into the lungs daily. 02/26/19   [provider]  prochlorperazine (COMPAZINE) 10 MG tablet Take 1 tablet (10 mg total) by mouth every 6 (six) hours as needed (Nausea or  vomiting). 04/24/18 08/04/18  Tish Men, MD    Allergies    Patient has no known allergies.  Review of Systems   Review of Systems  Constitutional: Positive for appetite change, fatigue and unexpected weight change. Negative for chills, diaphoresis and fever.       Endorses weight gain in the last month, protuberance of his abdomen  HENT: Negative.   Eyes: Negative.   Respiratory: Positive for shortness of breath. Negative for cough, chest tightness, wheezing and stridor.   Cardiovascular: Positive for chest pain and leg swelling. Negative for palpitations.  Gastrointestinal: Positive for abdominal distention. Negative for abdominal pain, constipation, diarrhea, nausea and vomiting.  Genitourinary: Negative.   Musculoskeletal: Negative.   Skin: Negative.   Neurological: Negative.   Hematological: Negative.   Psychiatric/Behavioral: Negative.     Physical Exam Updated Vital Signs BP 126/72   Pulse (!) 101   Temp 97.9 F (36.6 C) (Oral)   Resp 18   Ht 5\' 10"  (1.778 m)   Wt 93 kg   SpO2 100%   BMI 29.41 kg/m   Physical Exam Vitals and nursing note reviewed.  Constitutional:      Appearance: He is overweight. He is not toxic-appearing.  HENT:     Head: Normocephalic and atraumatic.     Nose: Nose normal.     Mouth/Throat:     Mouth: Mucous membranes are moist.     Pharynx: Oropharynx is clear. Uvula midline. No oropharyngeal exudate or posterior oropharyngeal erythema.     Tonsils: No tonsillar exudate.  Eyes:     General: Vision grossly intact.        Right eye: No discharge.        Left eye: No discharge.     Extraocular Movements: Extraocular movements intact.     Conjunctiva/sclera: Conjunctivae normal.     Pupils: Pupils are equal, round, and reactive to light.     Comments: Mild edema of lower lids bilaterally, without crepitus, erythema, or TTP.   Neck:     Vascular: JVD present.     Trachea: Trachea and phonation normal.  Cardiovascular:     Rate and  Rhythm: Regular rhythm. Tachycardia present.  Pulses:          Radial pulses are 2+ on the right side and 2+ on the left side.       Dorsalis pedis pulses are 1+ on the right side.     Heart sounds: No murmur heard.     Comments: Cardiac exam complicated by transmitted upper airway noises.  Pulmonary:     Effort: Tachypnea present. No accessory muscle usage or respiratory distress.     Breath sounds: Transmitted upper airway sounds present. Examination of the right-lower field reveals decreased breath sounds and rhonchi. Examination of the left-lower field reveals decreased breath sounds. Decreased breath sounds and rhonchi present. No wheezing or rales.  Chest:     Chest wall: Tenderness present. No mass, lacerations, deformity, swelling, crepitus or edema.    Abdominal:     General: Abdomen is protuberant. Bowel sounds are normal. There is no distension.     Palpations: Abdomen is soft. There is no pulsatile mass.     Tenderness: There is generalized abdominal tenderness. There is no right CVA tenderness, left CVA tenderness, guarding or rebound.     Hernia: A hernia is present. Hernia is present in the umbilical area.  Musculoskeletal:        General: No deformity.     Right wrist: Normal.     Left wrist: Normal.     Right hand: Laceration and tenderness present. No bony tenderness. Normal range of motion. Normal capillary refill. Normal pulse.     Left hand: Normal.       Hands:     Cervical back: Neck supple. No crepitus. No pain with movement, spinous process tenderness or muscular tenderness.     Right lower leg: 2+ Edema present.     Left lower leg: 2+ Edema present.  Lymphadenopathy:     Cervical: No cervical adenopathy.  Skin:    General: Skin is warm and dry.     Capillary Refill: Capillary refill takes less than 2 seconds.     Nails: There is clubbing.  Neurological:     General: No focal deficit present.     Mental Status: He is alert and oriented to person,  place, and time. Mental status is at baseline.     Sensory: Sensation is intact.     Motor: Motor function is intact.     Gait: Gait is intact.  Psychiatric:        Mood and Affect: Mood normal.     ED Results / Procedures / Treatments   Labs (all labs ordered are listed, but only abnormal results are displayed) Labs Reviewed  COMPREHENSIVE METABOLIC PANEL - Abnormal; Notable for the following components:      Result Value   CO2 17 (*)    Glucose, Bld 299 (*)    Creatinine, Ser 1.38 (*)    GFR, Estimated 57 (*)    All other components within normal limits  CBC - Abnormal; Notable for the following components:   Hemoglobin 12.5 (*)    HCT 37.9 (*)    All other components within normal limits  URINALYSIS, ROUTINE W REFLEX MICROSCOPIC - Abnormal; Notable for the following components:   Glucose, UA >=500 (*)    Hgb urine dipstick MODERATE (*)    Protein, ur 100 (*)    All other components within normal limits  URINALYSIS, MICROSCOPIC (REFLEX) - Abnormal; Notable for the following components:   Bacteria, UA RARE (*)    All other components within normal limits  RESP PANEL BY RT-PCR (FLU A&B, COVID) ARPGX2  LIPASE, BLOOD  BRAIN NATRIURETIC PEPTIDE  TROPONIN I (HIGH SENSITIVITY)  TROPONIN I (HIGH SENSITIVITY)    EKG EKG Interpretation  Date/Time:  Thursday June 15 2020 15:07:27 EDT Ventricular Rate:  107 PR Interval:  174 QRS Duration: 82 QT Interval:  326 QTC Calculation: 435 R Axis:   78 Text Interpretation: Sinus tachycardia Nonspecific T wave abnormality Abnormal ECG No significant change since prior 7/21 Confirmed by Aletta Edouard [id number]) on 06/15/2020 3:13:34 PM   Radiology CT Angio Chest PE W/Cm &/Or Wo Cm  Result Date: 06/15/2020 CLINICAL DATA:  History of lung cancer, short of breath, abdominal pain EXAM: CT ANGIOGRAPHY CHEST CT ABDOMEN AND PELVIS WITH CONTRAST TECHNIQUE: Multidetector CT imaging of the chest was performed using the standard protocol during  bolus administration of intravenous contrast. Multiplanar CT image reconstructions and MIPs were obtained to evaluate the vascular anatomy. Multidetector CT imaging of the abdomen and pelvis was performed using the standard protocol during bolus administration of intravenous contrast. CONTRAST:  160mL OMNIPAQUE IOHEXOL 350 MG/ML SOLN COMPARISON:  06/15/2020 04/12/2020 08/27/2019 FINDINGS: CTA CHEST FINDINGS Cardiovascular: This is a technically adequate evaluation of the pulmonary vasculature. No filling defects or pulmonary emboli. The heart is unremarkable without pericardial effusion. No evidence of thoracic aortic aneurysm or dissection. Mild atherosclerosis of the aortic arch. Mediastinum/Nodes: No enlarged mediastinal, hilar, or axillary lymph nodes. Thyroid gland, trachea, and esophagus demonstrate no significant findings. Lungs/Pleura: Stable post radiation changes within the right perihilar region, greatest in the right lower lobe. There are stable subcentimeter pulmonary nodules within the right lung: Right upper lobe, image 23/5, 3 mm. Right lower lobe, image 44/5, 3 mm. Right lower lobe, image 52/5, 5 mm. No acute airspace disease, effusion, or pneumothorax. Stable bronchiectasis. Central airways are patent. Musculoskeletal: No acute or destructive bony lesions. Reconstructed images demonstrate no additional findings. Review of the MIP images confirms the above findings. CT ABDOMEN and PELVIS FINDINGS Hepatobiliary: No focal liver abnormality is seen. No gallstones, gallbladder wall thickening, or biliary dilatation. Pancreas: Unremarkable. No pancreatic ductal dilatation or surrounding inflammatory changes. Spleen: Normal in size without focal abnormality. Adrenals/Urinary Tract: Adrenal glands are unremarkable. Kidneys are normal, without renal calculi, focal lesion, or hydronephrosis. Bladder is unremarkable. Stomach/Bowel: No bowel obstruction or ileus. Normal appendix right lower quadrant. No bowel  wall thickening or inflammatory change. Vascular/Lymphatic: Aortic atherosclerosis. No enlarged abdominal or pelvic lymph nodes. Reproductive: Prostate is unremarkable. Other: No free fluid or free gas. Small fat containing umbilical hernia. No bowel herniation. Musculoskeletal: No acute or destructive bony lesions. Reconstructed images demonstrate no additional findings. Review of the MIP images confirms the above findings. IMPRESSION: 1. No evidence of pulmonary embolus. 2. Stable subcentimeter right-sided pulmonary nodules, likely benign. Continued follow-up recommended. 3. Stable post radiation changes, greatest in the right lower lobe. 4. No acute intra-abdominal or intrapelvic process. 5.  Aortic Atherosclerosis (ICD10-I70.0). Electronically Signed   By: Randa Ngo M.D.   On: 06/15/2020 20:01   CT Abdomen Pelvis W Contrast  Result Date: 06/15/2020 CLINICAL DATA:  History of lung cancer, short of breath, abdominal pain EXAM: CT ANGIOGRAPHY CHEST CT ABDOMEN AND PELVIS WITH CONTRAST TECHNIQUE: Multidetector CT imaging of the chest was performed using the standard protocol during bolus administration of intravenous contrast. Multiplanar CT image reconstructions and MIPs were obtained to evaluate the vascular anatomy. Multidetector CT imaging of the abdomen and pelvis was performed using the standard protocol during bolus administration of intravenous contrast. CONTRAST:  184mL OMNIPAQUE IOHEXOL 350 MG/ML SOLN COMPARISON:  06/15/2020 04/12/2020 08/27/2019 FINDINGS: CTA CHEST FINDINGS Cardiovascular: This is a technically adequate evaluation of the pulmonary vasculature. No filling defects or pulmonary emboli. The heart is unremarkable without pericardial effusion. No evidence of thoracic aortic aneurysm or dissection. Mild atherosclerosis of the aortic arch. Mediastinum/Nodes: No enlarged mediastinal, hilar, or axillary lymph nodes. Thyroid gland, trachea, and esophagus demonstrate no significant findings.  Lungs/Pleura: Stable post radiation changes within the right perihilar region, greatest in the right lower lobe. There are stable subcentimeter pulmonary nodules within the right lung: Right upper lobe, image 23/5, 3 mm. Right lower lobe, image 44/5, 3 mm. Right lower lobe, image 52/5, 5 mm. No acute airspace disease, effusion, or pneumothorax. Stable bronchiectasis. Central airways are patent. Musculoskeletal: No acute or destructive bony lesions. Reconstructed images demonstrate no additional findings. Review of the MIP images confirms the above findings. CT ABDOMEN and PELVIS FINDINGS Hepatobiliary: No focal liver abnormality is seen. No gallstones, gallbladder wall thickening, or biliary dilatation. Pancreas: Unremarkable. No pancreatic ductal dilatation or surrounding inflammatory changes. Spleen: Normal in size without focal abnormality. Adrenals/Urinary Tract: Adrenal glands are unremarkable. Kidneys are normal, without renal calculi, focal lesion, or hydronephrosis. Bladder is unremarkable. Stomach/Bowel: No bowel obstruction or ileus. Normal appendix right lower quadrant. No bowel wall thickening or inflammatory change. Vascular/Lymphatic: Aortic atherosclerosis. No enlarged abdominal or pelvic lymph nodes. Reproductive: Prostate is unremarkable. Other: No free fluid or free gas. Small fat containing umbilical hernia. No bowel herniation. Musculoskeletal: No acute or destructive bony lesions. Reconstructed images demonstrate no additional findings. Review of the MIP images confirms the above findings. IMPRESSION: 1. No evidence of pulmonary embolus. 2. Stable subcentimeter right-sided pulmonary nodules, likely benign. Continued follow-up recommended. 3. Stable post radiation changes, greatest in the right lower lobe. 4. No acute intra-abdominal or intrapelvic process. 5.  Aortic Atherosclerosis (ICD10-I70.0). Electronically Signed   By: Randa Ngo M.D.   On: 06/15/2020 20:01   DG Chest Portable 1  View  Result Date: 06/15/2020 CLINICAL DATA:  Shortness of breath EXAM: PORTABLE CHEST 1 VIEW COMPARISON:  03/02/2020.  CT 10/09/2019 FINDINGS: Left Port-A-Cath remains in place, unchanged. Platelike scarring in the right upper lobe, stable. Soft tissue fullness in the right hilum is also stable since prior study and prior CT. Left lung clear. No effusions. No acute bony abnormality. IMPRESSION: No active cardiopulmonary disease. Electronically Signed   By: Rolm Baptise M.D.   On: 06/15/2020 17:16   DG Hand Complete Right  Result Date: 06/15/2020 CLINICAL DATA:  Right hand injury EXAM: RIGHT HAND - COMPLETE 3+ VIEW COMPARISON:  None. FINDINGS: Degenerative changes within the IP joints and 1st carpometacarpal joint with spurring. No bony erosions. No acute bony abnormality. Specifically, no fracture, subluxation, or dislocation. IMPRESSION: Mild degenerative changes throughout the right hand and wrist. No acute bony abnormality. Electronically Signed   By: Rolm Baptise M.D.   On: 06/15/2020 17:14    Procedures .Marland KitchenLaceration Repair  Date/Time: 06/15/2020 9:49 PM Performed by: Emeline Darling, PA-C Authorized by: Emeline Darling, PA-C   Consent:    Consent obtained:  Verbal   Consent given by:  Patient   Risks discussed:  Infection, need for additional repair, pain, poor cosmetic result and poor wound healing   Alternatives discussed:  No treatment and delayed treatment Universal protocol:    Procedure explained and questions answered to patient or proxy's satisfaction: yes     Relevant documents present and verified: yes  Test results available: yes     Imaging studies available: yes     Required blood products, implants, devices, and special equipment available: yes     Site/side marked: yes     Immediately prior to procedure, a time out was called: yes     Patient identity confirmed:  Verbally with patient Anesthesia:    Anesthesia method:  Local infiltration and nerve  block   Local anesthetic:  Lidocaine 1% w/o epi   Block location:  Rihgt middle finger   Block needle gauge:  25 G   Block anesthetic:  Lidocaine 1% w/o epi   Block technique:  Digital block   Block injection procedure:  Anatomic landmarks identified Laceration details:    Location:  Finger   Finger location:  R long finger   Length (cm):  3 Pre-procedure details:    Preparation:  Patient was prepped and draped in usual sterile fashion Exploration:    Imaging obtained: x-ray     Imaging outcome: foreign body not noted     Wound extent: no foreign bodies/material noted, no tendon damage noted and no underlying fracture noted   Treatment:    Area cleansed with:  Betadine and saline   Amount of cleaning:  Standard   Irrigation solution:  Sterile saline   Irrigation method:  Pressure wash   Debridement:  None Skin repair:    Repair method:  Sutures   Suture size:  4-0   Suture material:  Prolene   Suture technique:  Simple interrupted   Number of sutures:  3 Approximation:    Approximation:  Close Repair type:    Repair type:  Simple Post-procedure details:    Dressing:  Antibiotic ointment and non-adherent dressing   Procedure completion:  Tolerated well, no immediate complications    Medications Ordered in ED Medications  lidocaine (PF) (XYLOCAINE) 1 % injection 10 mL (has no administration in time range)  aerochamber Z-Stat Plus/medium 1 each (has no administration in time range)  iohexol (OMNIPAQUE) 350 MG/ML injection 100 mL (100 mLs Intravenous Contrast Given 06/15/20 1924)  albuterol (VENTOLIN HFA) 108 (90 Base) MCG/ACT inhaler 2 puff (2 puffs Inhalation Given 06/15/20 2304)  cephALEXin (KEFLEX) capsule 500 mg (500 mg Oral Given 06/15/20 2302)    ED Course  I have reviewed the triage vital signs and the nursing notes.  Pertinent labs & imaging results that were available during my care of the patient were reviewed by me and considered in my medical decision making  (see chart for details).  Clinical Course as of 06/16/20 0018  Thu Jun 15, 2020  1630 Boostrix administered at outside facility January 2022. Patient is up to date.   [RS]  2315 Ambulatory O2 95-96% on RA.  [RS]    Clinical Course User Index [RS] Kayliah Tindol, Sharlene Dory   MDM Rules/Calculators/A&P                         65 year old male with history of lung cancer presents with 2 weeks of progressively worsening shortness of breath.  History of diabetes and COPD. Additionally with lac to right middle finger.   Differential diagnosis for this patient symptoms includes but is not limited to ACS, PE, CHF, pneumonia, pneumothorax, arrhythmia, recurrence of his malignancy, bronchospasm, COPD, pulmonary hypertension, anemia.  Hypertensive and tachycardic on intake.  Vital signs otherwise normal.  Cardiopulmonary exam revealed distant heart sounds significant transmitted upper airway noises with diminished breath sounds in the  bases and rhonchi throughout.  Patient with increased work of breathing, accessory muscle use, tachypnea.  Additionally patient's abdomen is protuberant though soft and generally tender.  Bilateral lower extremity 2+ edema, nonpitting, though tender to palpation.  Basic laboratory studies obtained in triage.  CBC with mild anemia hemoglobin 12.5, patient baseline.  CMP with hyperglycemia 299, creatinine elevated to 1.38, however appears to be a patient baseline.  UA with significant glucose, moderate hemoglobin, rare bacteria.  Lipase is normal at 44.  Will proceed with troponin, dimer, BNP, and Covid swab.  Additionally we will proceed with chest x-ray.  Troponin negative, 5, BNP negative, COVID-19 test is negative as well.  D-dimer was not obtained given patient's history of malignancy.  Concern for persistent shortness of breath, will proceed with CT angiogram of the chest, as well as CT scan of the abdomen pelvis given recent abdominal pain and tenderness to palpation  on today's exam.  CTA of the chest negative for PE, stable right-sided pulmonary nodules.  CT of the abdomen and pelvis without acute intra-abdominal or intrapelvic process. Ambulatory O2 sat is normal on room air.  Patient without increased work of breathing at this time, no longer tachypneic.  While exact cause of patient's shortness of breath remains unclear, there does not appear to be any emergent etiology at this time.  Suspect COPD component, recommend close pulmonology follow-up.  Laceration repaired as above with sutures x3, patient should return in 10 to 14 days for suture removal.  Given depth of the wound, patient was prescribed outpatient course of cephalexin to prevent infection.  Plain film negative for underlying fracture or dislocation.  Given reassuring laboratory studies, imaging, vital signs in the emergency department today, no further work-up is warranted at this time.  Suspect COPD contributing to worsening shortness of breath.  Recommend pulmonology follow-up.  Redell voiced understanding of his medical evaluation and treatment plan.  Each of his questions was answered to his expressed satisfaction.  Return precautions given.  Patient is stable and appropriate for discharge at this time.  This chart was dictated using voice recognition software, Dragon. Despite the best efforts of this provider to proofread and correct errors, errors may still occur which can change documentation meaning.  Final Clinical Impression(s) / ED Diagnoses Final diagnoses:  Laceration of right middle finger without foreign body without damage to nail, initial encounter  Shortness of breath    Rx / DC Orders ED Discharge Orders         Ordered    cephALEXin (KEFLEX) 500 MG capsule  2 times daily        06/15/20 2318           Townes Fuhs, Gypsy Balsam, PA-C 06/16/20 0018    Hayden Rasmussen, MD 06/16/20 1037

## 2020-06-15 NOTE — Discharge Instructions (Addendum)
You were seen in the ER for your shortness of breath and abdominal pain. Your blood work, CT scans, and EKG were very reassuring. While the exact cause of your shortness of breath remains unclear, there is not any emergent problem with your heart or lungs.  I suspect this is related to your COPD and recommend you follow-up with your pulmonologist, your lung doctor.  Additionally you had a cut to the right middle finger, which was repaired with sutures.  You had 3 sutures placed which will need to be removed in 10 to 14 days.  You may return to the emergency department for suture removal.  Please apply a clean dressing to the wound daily and apply antibiotics if you are going to be leaving your home.  You may wash your hands as normal, please do not leave your hands submerged in water while your sutures are in place and your wound is healing.  You have been prescribed an antibiotic to take daily for the next 5 days at home to prevent development of infection in your laceration.  Please take the entire course as prescribed.  Return to the emergency department if you develop any worsening shortness of breath, chest pain, palpitations, nausea vomiting does not stop, fevers, chills, redness, drainage from your wound as these could be signs of infection.

## 2020-06-15 NOTE — ED Notes (Signed)
Pt ambulated from room 6 to room 4 and back. Pt O2 stats remain from 95% to 96%. HR 106 and RR 20. Pt denies any sob but complains of pain in his feet.

## 2020-10-06 ENCOUNTER — Other Ambulatory Visit: Payer: Self-pay

## 2020-10-06 ENCOUNTER — Emergency Department (HOSPITAL_BASED_OUTPATIENT_CLINIC_OR_DEPARTMENT_OTHER): Payer: Medicare HMO

## 2020-10-06 ENCOUNTER — Inpatient Hospital Stay (HOSPITAL_BASED_OUTPATIENT_CLINIC_OR_DEPARTMENT_OTHER)
Admission: EM | Admit: 2020-10-06 | Discharge: 2020-10-08 | DRG: 191 | Disposition: A | Discharge status: Home / Self Care | Payer: Medicare HMO | Attending: Student | Admitting: Student

## 2020-10-06 ENCOUNTER — Encounter (HOSPITAL_BASED_OUTPATIENT_CLINIC_OR_DEPARTMENT_OTHER): Payer: Self-pay | Admitting: *Deleted

## 2020-10-06 DIAGNOSIS — I5032 Chronic diastolic (congestive) heart failure: Secondary | ICD-10-CM | POA: Diagnosis present

## 2020-10-06 DIAGNOSIS — E1142 Type 2 diabetes mellitus with diabetic polyneuropathy: Secondary | ICD-10-CM

## 2020-10-06 DIAGNOSIS — N1831 Chronic kidney disease, stage 3a: Secondary | ICD-10-CM

## 2020-10-06 DIAGNOSIS — J441 Chronic obstructive pulmonary disease with (acute) exacerbation: Principal | ICD-10-CM | POA: Diagnosis present

## 2020-10-06 DIAGNOSIS — C801 Malignant (primary) neoplasm, unspecified: Secondary | ICD-10-CM | POA: Diagnosis present

## 2020-10-06 DIAGNOSIS — E1165 Type 2 diabetes mellitus with hyperglycemia: Secondary | ICD-10-CM | POA: Diagnosis not present

## 2020-10-06 DIAGNOSIS — N179 Acute kidney failure, unspecified: Secondary | ICD-10-CM | POA: Diagnosis not present

## 2020-10-06 DIAGNOSIS — Z23 Encounter for immunization: Secondary | ICD-10-CM

## 2020-10-06 DIAGNOSIS — Z7951 Long term (current) use of inhaled steroids: Secondary | ICD-10-CM

## 2020-10-06 DIAGNOSIS — Z87891 Personal history of nicotine dependence: Secondary | ICD-10-CM

## 2020-10-06 DIAGNOSIS — Z923 Personal history of irradiation: Secondary | ICD-10-CM

## 2020-10-06 DIAGNOSIS — Z85118 Personal history of other malignant neoplasm of bronchus and lung: Secondary | ICD-10-CM

## 2020-10-06 DIAGNOSIS — E1122 Type 2 diabetes mellitus with diabetic chronic kidney disease: Secondary | ICD-10-CM

## 2020-10-06 DIAGNOSIS — D631 Anemia in chronic kidney disease: Secondary | ICD-10-CM | POA: Diagnosis present

## 2020-10-06 DIAGNOSIS — E1129 Type 2 diabetes mellitus with other diabetic kidney complication: Secondary | ICD-10-CM | POA: Diagnosis present

## 2020-10-06 DIAGNOSIS — T465X5A Adverse effect of other antihypertensive drugs, initial encounter: Secondary | ICD-10-CM | POA: Diagnosis not present

## 2020-10-06 DIAGNOSIS — Z833 Family history of diabetes mellitus: Secondary | ICD-10-CM

## 2020-10-06 DIAGNOSIS — Z79899 Other long term (current) drug therapy: Secondary | ICD-10-CM

## 2020-10-06 DIAGNOSIS — G629 Polyneuropathy, unspecified: Secondary | ICD-10-CM

## 2020-10-06 DIAGNOSIS — I130 Hypertensive heart and chronic kidney disease with heart failure and stage 1 through stage 4 chronic kidney disease, or unspecified chronic kidney disease: Secondary | ICD-10-CM | POA: Diagnosis present

## 2020-10-06 DIAGNOSIS — Z806 Family history of leukemia: Secondary | ICD-10-CM

## 2020-10-06 DIAGNOSIS — N183 Chronic kidney disease, stage 3 unspecified: Secondary | ICD-10-CM | POA: Diagnosis present

## 2020-10-06 DIAGNOSIS — Z20822 Contact with and (suspected) exposure to covid-19: Secondary | ICD-10-CM | POA: Diagnosis present

## 2020-10-06 DIAGNOSIS — R0902 Hypoxemia: Secondary | ICD-10-CM

## 2020-10-06 DIAGNOSIS — Z9221 Personal history of antineoplastic chemotherapy: Secondary | ICD-10-CM

## 2020-10-06 DIAGNOSIS — C7801 Secondary malignant neoplasm of right lung: Secondary | ICD-10-CM | POA: Diagnosis present

## 2020-10-06 DIAGNOSIS — J9611 Chronic respiratory failure with hypoxia: Secondary | ICD-10-CM | POA: Diagnosis present

## 2020-10-06 DIAGNOSIS — T502X5A Adverse effect of carbonic-anhydrase inhibitors, benzothiadiazides and other diuretics, initial encounter: Secondary | ICD-10-CM | POA: Diagnosis not present

## 2020-10-06 DIAGNOSIS — Z794 Long term (current) use of insulin: Secondary | ICD-10-CM

## 2020-10-06 DIAGNOSIS — T380X5A Adverse effect of glucocorticoids and synthetic analogues, initial encounter: Secondary | ICD-10-CM | POA: Diagnosis present

## 2020-10-06 LAB — CBC WITH DIFFERENTIAL/PLATELET
Abs Immature Granulocytes: 0.06 10*3/uL (ref 0.00–0.07)
Basophils Absolute: 0.1 10*3/uL (ref 0.0–0.1)
Basophils Relative: 1 %
Eosinophils Absolute: 0.2 10*3/uL (ref 0.0–0.5)
Eosinophils Relative: 2 %
HCT: 34.8 % — ABNORMAL LOW (ref 39.0–52.0)
Hemoglobin: 11.4 g/dL — ABNORMAL LOW (ref 13.0–17.0)
Immature Granulocytes: 1 %
Lymphocytes Relative: 26 %
Lymphs Abs: 1.9 10*3/uL (ref 0.7–4.0)
MCH: 30 pg (ref 26.0–34.0)
MCHC: 32.8 g/dL (ref 30.0–36.0)
MCV: 91.6 fL (ref 80.0–100.0)
Monocytes Absolute: 0.8 10*3/uL (ref 0.1–1.0)
Monocytes Relative: 11 %
Neutro Abs: 4.4 10*3/uL (ref 1.7–7.7)
Neutrophils Relative %: 59 %
Platelets: 297 10*3/uL (ref 150–400)
RBC: 3.8 MIL/uL — ABNORMAL LOW (ref 4.22–5.81)
RDW: 14.6 % (ref 11.5–15.5)
WBC: 7.4 10*3/uL (ref 4.0–10.5)
nRBC: 0 % (ref 0.0–0.2)

## 2020-10-06 LAB — COMPREHENSIVE METABOLIC PANEL
ALT: 20 U/L (ref 0–44)
AST: 21 U/L (ref 15–41)
Albumin: 4.1 g/dL (ref 3.5–5.0)
Alkaline Phosphatase: 95 U/L (ref 38–126)
Anion gap: 8 (ref 5–15)
BUN: 30 mg/dL — ABNORMAL HIGH (ref 8–23)
CO2: 24 mmol/L (ref 22–32)
Calcium: 8.8 mg/dL — ABNORMAL LOW (ref 8.9–10.3)
Chloride: 103 mmol/L (ref 98–111)
Creatinine, Ser: 2.14 mg/dL — ABNORMAL HIGH (ref 0.61–1.24)
GFR, Estimated: 34 mL/min — ABNORMAL LOW (ref >60)
Glucose, Bld: 188 mg/dL — ABNORMAL HIGH (ref 70–99)
Potassium: 3.9 mmol/L (ref 3.5–5.1)
Sodium: 135 mmol/L (ref 135–145)
Total Bilirubin: 0.9 mg/dL (ref 0.3–1.2)
Total Protein: 7.4 g/dL (ref 6.5–8.1)

## 2020-10-06 MED ORDER — HYDROCODONE BIT-HOMATROP MBR 5-1.5 MG/5ML PO SOLN: 5.0000 mL | Freq: Once | ORAL | Status: DC

## 2020-10-06 MED ORDER — ALBUTEROL SULFATE HFA 108 (90 BASE) MCG/ACT IN AERS
2.0000 | INHALATION_SPRAY | Freq: Once | RESPIRATORY_TRACT | Status: AC
  Administered 2020-10-06: 2 via RESPIRATORY_TRACT
  Filled 2020-10-06: qty 6.7

## 2020-10-06 MED ORDER — HYDROCOD POLST-CPM POLST ER 10-8 MG/5ML PO SUER
5.0000 mL | Freq: Once | ORAL | Status: AC
  Administered 2020-10-06: 5 mL via ORAL
  Filled 2020-10-06: qty 5

## 2020-10-06 MED ORDER — PREDNISONE 50 MG PO TABS
60.0000 mg | ORAL_TABLET | Freq: Once | ORAL | Status: AC
  Administered 2020-10-06: 60 mg via ORAL
  Filled 2020-10-06: qty 1

## 2020-10-06 NOTE — ED Provider Notes (Signed)
[facility] Provider Note   CSN: [id number] Arrival date & time: 10/06/20  2253     History Chief Complaint  Patient presents with   Shortness of Breath    Herchel Hopkin is a 65 y.o. male.  65 year old male with multi medical problems document below who presents emerged from today secondary to shortness of breath.  He has a history of acute renal failure, diabetes, hypertension, lung cancer status post chemo and radiation a shin that is been in remission for couple years.  He also has COPD but no longer smokes.  He states over the last couple days is a progressively worsening shortness of breath.  He also has cough with epigastric and left upper quadrant pain associated with it.  No known fevers.  No productive cough.  No lower extremity swelling today but he does have intermittently.  States compliance with his medications including his torsemide.  No sick contacts.   Shortness of Breath     Past Medical History:  Diagnosis Date   Arthritis    Cancer ([place])    lung   Chronic kidney disease    Diabetes mellitus    TYPE II   Dyspnea    at times   Hypertension    Pneumonia    "years ago"    Patient Active Problem List   Diagnosis Date Noted   Acute respiratory failure with hypoxia ([place]) 03/31/2019   Hyperglycemia 03/21/2019   AKI (acute kidney injury) ([place]) 03/21/2019   ARF (acute renal failure) ([place]) 03/20/2019   Exertional dyspnea 09/04/2018   Leukopenia due to antineoplastic chemotherapy ([place]) 06/05/2018   Diabetes ([place]) 05/15/2018   Diabetic neuropathy ([place]) 05/08/2018   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 [facility]) 04/30/2018   COPD (chronic obstructive pulmonary disease) ([place]) 04/24/2018   Right lower lobe lung mass    Anemia due to antineoplastic chemotherapy 04/06/2018   Lung cancer ([place]) 04/03/2018    Past Surgical History:  Procedure Laterality Date   COLONOSCOPY W/  POLYPECTOMY     IR IMAGING GUIDED PORT INSERTION  05/05/2018   NASAL FRACTURE SURGERY     VIDEO BRONCHOSCOPY WITH ENDOBRONCHIAL ULTRASOUND Right 04/20/2018   Procedure: VIDEO BRONCHOSCOPY WITH ENDOBRONCHIAL ULTRASOUND;  Surgeon: Juanito Doom, MD;  Location: [facility] OR;  Service: Thoracic;  Laterality: Right;       Family History  Problem Relation Age of Onset   Leukemia Sister    Cancer Brother    Diabetes Mother     Social History   Tobacco Use   Smoking status: Former    Packs/day: 1.00    Years: 45.00    Pack years: 45.00    Types: Cigarettes    Quit date: 03/31/2018    Years since quitting: 2.5   Smokeless tobacco: Former    Types: Chew    Quit date: 03/25/1973  Vaping Use   Vaping Use: Never used  Substance Use Topics   Alcohol use: Not Currently   Drug use: No    Home Medications Prior to Admission medications   Medication Sig Start Date End Date Taking? Authorizing Provider  albuterol (VENTOLIN HFA) 108 (90 Base) MCG/ACT inhaler Inhale 2 puffs into the lungs every 6 (six) hours as needed for wheezing or shortness of breath. 07/09/18   Tish Men, MD  amitriptyline (ELAVIL) 50 MG tablet Take 50 mg by mouth at bedtime.    [provider]  amLODipine (NORVASC) 5 MG tablet  Take 5 mg by mouth daily. 04/10/18   [provider]  budesonide-formoterol (SYMBICORT) 160-4.5 MCG/ACT inhaler Inhale 2 puffs into the lungs 2 (two) times daily. 07/09/18   Tish Men, MD  DULoxetine (CYMBALTA) 30 MG capsule Take 30 mg by mouth daily. 09/13/19   [provider]  famotidine (PEPCID) 20 MG tablet Take 20 mg by mouth 2 (two) times daily. 03/11/19   [provider]  furosemide (LASIX) 40 MG tablet Take 40 mg by mouth 2 (two) times daily. 03/25/19   [provider]  gabapentin (NEURONTIN) 300 MG capsule Take 2 capsules by mouth twice daily Patient taking differently: Take 300 mg by mouth 2 (two) times daily.  12/25/18   Tish Men, MD  glimepiride  (AMARYL) 2 MG tablet Take 2 mg by mouth daily with breakfast.  08/01/18   [provider]  glucose blood test strip 4x a day 07/01/18   Shamleffer, Melanie Crazier, MD  HYDROcodone-acetaminophen (NORCO) 5-325 MG tablet Take 1 tablet by mouth every 6 (six) hours as needed for severe pain. 10/09/19   Molpus, John, MD  insulin aspart (NOVOLOG) 100 UNIT/ML FlexPen Inject 6-18 Units into the skin See admin instructions. BS 180-200 add extra 2 units BS 201-250 add 5 units BS 251-300 add 7 units 301-350 add 10 units 351-400 add 12 units  bs >400 call PCP 03/25/19   [provider]  Insulin Glargine (LANTUS) 100 UNIT/ML Solostar Pen Inject 25 Units into the skin at bedtime. 03/22/19 04/21/19  Domenic Polite, MD  Insulin Pen Needle (BD PEN NEEDLE NANO U/F) 32G X 4 MM MISC 4x daily 07/01/18   Shamleffer, Melanie Crazier, MD  lidocaine (LIDODERM) 5 % Place 1 patch onto the skin daily. Remove & Discard patch within 12 hours or as directed by MD 06/12/19   Eustaquio Maize, PA-C  lidocaine (XYLOCAINE) 2 % solution Patient: Mix 1part 2% viscous lidocaine, 1part H20. Swish & swallow 38mL of diluted mixture, 8min before meals and at bedtime, up to QID Patient taking differently: Use as directed 15 mLs in the mouth or throat 4 (four) times daily as needed for mouth pain. Patient: Mix 1part 2% viscous lidocaine, 1part H20. Swish & swallow 77mL of diluted mixture, 69min before meals and at bedtime, up to QID 05/29/18   Tish Men, MD  losartan (COZAAR) 25 MG tablet Take 25 mg by mouth daily. 09/03/19   [provider]  magnesium oxide (MAG-OX) 400 (241.3 Mg) MG tablet Take 1 tablet by mouth 2 (two) times daily. 03/15/19   [provider]  metoprolol succinate (TOPROL-XL) 50 MG 24 hr tablet Take 50 mg by mouth daily. 03/13/19   [provider]  morphine (MSIR) 15 MG tablet Take 15 mg by mouth every 8 (eight) hours as needed for pain. 01/22/19   [provider]  ondansetron (ZOFRAN  ODT) 4 MG disintegrating tablet Take 1 tablet (4 mg total) by mouth every 8 (eight) hours as needed for nausea or vomiting. 03/02/20   Lucrezia Starch, MD  pantoprazole (PROTONIX) 40 MG tablet Take 40 mg by mouth daily. 03/05/19   [provider]  polyethylene glycol (MIRALAX / GLYCOLAX) 17 g packet Take 17 g by mouth daily. 04/03/19   Alma Friendly, MD  potassium chloride (KLOR-CON) 10 MEQ tablet Take 10 mEq by mouth daily. 03/11/19   [provider]  rosuvastatin (CRESTOR) 10 MG tablet Take 10 mg by mouth daily. 09/03/19   [provider]  torsemide (DEMADEX) 20  MG tablet Take 1 tablet (20 mg total) by mouth 2 (two) times daily. 03/24/19   Domenic Polite, MD  traMADol (ULTRAM) 50 MG tablet Take 1 tablet (50 mg total) by mouth every 8 (eight) hours as needed for moderate pain. 09/04/18   Tish Men, MD  TRELEGY ELLIPTA 100-62.5-25 MCG/INH AEPB Inhale 1 puff into the lungs daily. 02/26/19   [provider]  prochlorperazine (COMPAZINE) 10 MG tablet Take 1 tablet (10 mg total) by mouth every 6 (six) hours as needed (Nausea or vomiting). 04/24/18 08/04/18  Tish Men, MD    Allergies    Patient has no known allergies.  Review of Systems   Review of Systems  Respiratory:  Positive for shortness of breath.   All other systems reviewed and are negative.  Physical Exam Updated Vital Signs BP 124/82   Pulse (!) 111   Temp 97.8 F (36.6 C) (Oral)   Resp (!) 28   Ht 5\' 11"  (1.803 m)   Wt 93.9 kg   SpO2 91% Comment: amb  BMI 28.87 kg/m   Physical Exam Vitals and nursing note reviewed.  Constitutional:      Appearance: He is well-developed.  HENT:     Head: Normocephalic and atraumatic.  Eyes:     Pupils: Pupils are equal, round, and reactive to light.  Cardiovascular:     Rate and Rhythm: Normal rate.  Pulmonary:     Effort: Pulmonary effort is normal. No respiratory distress.     Breath sounds: No decreased breath sounds or wheezing.  Chest:      Chest wall: No tenderness.  Abdominal:     General: There is no distension.  Musculoskeletal:        General: Normal range of motion.     Cervical back: Normal range of motion.  Skin:    General: Skin is warm and dry.  Neurological:     Mental Status: He is alert.    ED Results / Procedures / Treatments   Labs (all labs ordered are listed, but only abnormal results are displayed) Labs Reviewed  CBC WITH DIFFERENTIAL/PLATELET - Abnormal; Notable for the following components:      Result Value   RBC 3.80 (*)    Hemoglobin 11.4 (*)    HCT 34.8 (*)    All other components within normal limits  COMPREHENSIVE METABOLIC PANEL    EKG EKG Interpretation  Date/Time:  Friday October 06 2020 23:04:59 EDT Ventricular Rate:  102 PR Interval:  176 QRS Duration: 93 QT Interval:  334 QTC Calculation: 435 R Axis:   63 Text Interpretation: Sinus tachycardia Borderline ST elevation, anterior leads Confirmed by Merrily Pew [phone]) on 10/06/2020 11:34:28 PM  Radiology No results found.  Procedures Procedures   Medications Ordered in ED Medications  albuterol (VENTOLIN HFA) 108 (90 Base) MCG/ACT inhaler 2 puff (has no administration in time range)  predniSONE (DELTASONE) tablet 60 mg (has no administration in time range)  chlorpheniramine-HYDROcodone (TUSSIONEX) 10-8 MG/5ML suspension 5 mL (has no administration in time range)    ED Course  I have reviewed the triage vital signs and the nursing notes.  Pertinent labs & imaging results that were available during my care of the patient were reviewed by me and considered in my medical decision making (see chart for details).    MDM Rules/Calculators/A&P                         Mildly hypoxic on room  air with ambulation at 91%.  Mildly tachycardic.  He has had multiple PE studies in the past however he is high for and his x-ray looks pretty baseline.  We will treat him for bronchitis and evaluate for PE. No PE. Multiple reevaluations  and still with hypoxia to 89-91 at rest, 83 when sleeping. Still tachypneic. Will check COVID and d/w hospitalist for admission.   Final Clinical Impression(s) / ED Diagnoses Final diagnoses:  COPD exacerbation ([place])  Hypoxia    Rx / DC Orders ED Discharge Orders     None        Leler Brion, Corene Cornea, MD 10/07/20 [id number]

## 2020-10-06 NOTE — ED Triage Notes (Signed)
Pt amb in triage with labored breathing o2 sat 91% RA , c/o SOB x 3 days HX lung CA

## 2020-10-07 ENCOUNTER — Emergency Department (HOSPITAL_BASED_OUTPATIENT_CLINIC_OR_DEPARTMENT_OTHER): Payer: Medicare HMO

## 2020-10-07 ENCOUNTER — Encounter (HOSPITAL_COMMUNITY): Payer: Self-pay | Admitting: Family Medicine

## 2020-10-07 DIAGNOSIS — J9611 Chronic respiratory failure with hypoxia: Secondary | ICD-10-CM | POA: Diagnosis present

## 2020-10-07 DIAGNOSIS — I5032 Chronic diastolic (congestive) heart failure: Secondary | ICD-10-CM | POA: Diagnosis present

## 2020-10-07 DIAGNOSIS — Z20822 Contact with and (suspected) exposure to covid-19: Secondary | ICD-10-CM | POA: Diagnosis present

## 2020-10-07 DIAGNOSIS — N1831 Chronic kidney disease, stage 3a: Secondary | ICD-10-CM

## 2020-10-07 DIAGNOSIS — Z923 Personal history of irradiation: Secondary | ICD-10-CM | POA: Diagnosis not present

## 2020-10-07 DIAGNOSIS — T380X5A Adverse effect of glucocorticoids and synthetic analogues, initial encounter: Secondary | ICD-10-CM | POA: Diagnosis present

## 2020-10-07 DIAGNOSIS — E1129 Type 2 diabetes mellitus with other diabetic kidney complication: Secondary | ICD-10-CM | POA: Diagnosis present

## 2020-10-07 DIAGNOSIS — Z79899 Other long term (current) drug therapy: Secondary | ICD-10-CM | POA: Diagnosis not present

## 2020-10-07 DIAGNOSIS — E1122 Type 2 diabetes mellitus with diabetic chronic kidney disease: Secondary | ICD-10-CM | POA: Diagnosis present

## 2020-10-07 DIAGNOSIS — D631 Anemia in chronic kidney disease: Secondary | ICD-10-CM | POA: Diagnosis present

## 2020-10-07 DIAGNOSIS — Z7951 Long term (current) use of inhaled steroids: Secondary | ICD-10-CM | POA: Diagnosis not present

## 2020-10-07 DIAGNOSIS — Z87891 Personal history of nicotine dependence: Secondary | ICD-10-CM | POA: Diagnosis not present

## 2020-10-07 DIAGNOSIS — T465X5A Adverse effect of other antihypertensive drugs, initial encounter: Secondary | ICD-10-CM | POA: Diagnosis not present

## 2020-10-07 DIAGNOSIS — Z806 Family history of leukemia: Secondary | ICD-10-CM | POA: Diagnosis not present

## 2020-10-07 DIAGNOSIS — E1165 Type 2 diabetes mellitus with hyperglycemia: Secondary | ICD-10-CM | POA: Diagnosis not present

## 2020-10-07 DIAGNOSIS — C7801 Secondary malignant neoplasm of right lung: Secondary | ICD-10-CM | POA: Diagnosis not present

## 2020-10-07 DIAGNOSIS — N183 Chronic kidney disease, stage 3 unspecified: Secondary | ICD-10-CM | POA: Diagnosis present

## 2020-10-07 DIAGNOSIS — J441 Chronic obstructive pulmonary disease with (acute) exacerbation: Secondary | ICD-10-CM | POA: Diagnosis present

## 2020-10-07 DIAGNOSIS — Z833 Family history of diabetes mellitus: Secondary | ICD-10-CM | POA: Diagnosis not present

## 2020-10-07 DIAGNOSIS — N179 Acute kidney failure, unspecified: Secondary | ICD-10-CM | POA: Diagnosis not present

## 2020-10-07 DIAGNOSIS — Z9221 Personal history of antineoplastic chemotherapy: Secondary | ICD-10-CM | POA: Diagnosis not present

## 2020-10-07 DIAGNOSIS — Z794 Long term (current) use of insulin: Secondary | ICD-10-CM | POA: Diagnosis not present

## 2020-10-07 DIAGNOSIS — I130 Hypertensive heart and chronic kidney disease with heart failure and stage 1 through stage 4 chronic kidney disease, or unspecified chronic kidney disease: Secondary | ICD-10-CM | POA: Diagnosis present

## 2020-10-07 DIAGNOSIS — T502X5A Adverse effect of carbonic-anhydrase inhibitors, benzothiadiazides and other diuretics, initial encounter: Secondary | ICD-10-CM | POA: Diagnosis not present

## 2020-10-07 DIAGNOSIS — Z85118 Personal history of other malignant neoplasm of bronchus and lung: Secondary | ICD-10-CM | POA: Diagnosis not present

## 2020-10-07 DIAGNOSIS — Z23 Encounter for immunization: Secondary | ICD-10-CM | POA: Diagnosis not present

## 2020-10-07 LAB — BRAIN NATRIURETIC PEPTIDE: B Natriuretic Peptide: 26.7 pg/mL (ref 0.0–100.0)

## 2020-10-07 LAB — TROPONIN I (HIGH SENSITIVITY)
Troponin I (High Sensitivity): 4 ng/L (ref <18)
Troponin I (High Sensitivity): 4 ng/L (ref <18)

## 2020-10-07 LAB — RESP PANEL BY RT-PCR (FLU A&B, COVID) ARPGX2
Influenza A by PCR: NEGATIVE
Influenza B by PCR: NEGATIVE
SARS Coronavirus 2 by RT PCR: NEGATIVE

## 2020-10-07 LAB — GLUCOSE, CAPILLARY
Glucose-Capillary: 397 mg/dL — ABNORMAL HIGH (ref 70–99)
Glucose-Capillary: 313 mg/dL — ABNORMAL HIGH (ref 70–99)

## 2020-10-07 MED ORDER — IPRATROPIUM-ALBUTEROL 0.5-2.5 (3) MG/3ML IN SOLN
3.0000 mL | Freq: Four times a day (QID) | RESPIRATORY_TRACT | Status: DC
  Administered 2020-10-07: 3 mL via RESPIRATORY_TRACT
  Filled 2020-10-07: qty 3

## 2020-10-07 MED ORDER — ALBUTEROL SULFATE (2.5 MG/3ML) 0.083% IN NEBU: 2.5000 mg | INHALATION_SOLUTION | RESPIRATORY_TRACT | Status: DC | PRN

## 2020-10-07 MED ORDER — HYDRALAZINE HCL 20 MG/ML IJ SOLN: 10.0000 mg | INTRAMUSCULAR | Status: DC | PRN

## 2020-10-07 MED ORDER — INSULIN GLARGINE 100 UNIT/ML SOLOSTAR PEN: 25.0000 [IU] | PEN_INJECTOR | Freq: Every day | SUBCUTANEOUS | Status: DC

## 2020-10-07 MED ORDER — IPRATROPIUM-ALBUTEROL 0.5-2.5 (3) MG/3ML IN SOLN
3.0000 mL | RESPIRATORY_TRACT | Status: DC
  Administered 2020-10-07 (×2): 3 mL via RESPIRATORY_TRACT
  Filled 2020-10-07 (×2): qty 3

## 2020-10-07 MED ORDER — IPRATROPIUM BROMIDE 0.02 % IN SOLN: 0.5000 mg | Freq: Four times a day (QID) | RESPIRATORY_TRACT | Status: DC

## 2020-10-07 MED ORDER — METOPROLOL SUCCINATE ER 50 MG PO TB24
50.0000 mg | ORAL_TABLET | Freq: Every day | ORAL | Status: DC
  Administered 2020-10-07 – 2020-10-08 (×2): 50 mg via ORAL
  Filled 2020-10-07 (×2): qty 1

## 2020-10-07 MED ORDER — IPRATROPIUM-ALBUTEROL 0.5-2.5 (3) MG/3ML IN SOLN
3.0000 mL | Freq: Four times a day (QID) | RESPIRATORY_TRACT | Status: DC
  Administered 2020-10-07 – 2020-10-08 (×3): 3 mL via RESPIRATORY_TRACT
  Filled 2020-10-07 (×3): qty 3

## 2020-10-07 MED ORDER — INSULIN ASPART 100 UNIT/ML IJ SOLN
0.0000 [IU] | Freq: Three times a day (TID) | INTRAMUSCULAR | Status: DC
  Administered 2020-10-07: 9 [IU] via SUBCUTANEOUS

## 2020-10-07 MED ORDER — IOHEXOL 350 MG/ML SOLN
100.0000 mL | Freq: Once | INTRAVENOUS | Status: AC | PRN
  Administered 2020-10-07: 100 mL via INTRAVENOUS

## 2020-10-07 MED ORDER — SODIUM CHLORIDE 0.9 % IV SOLN
1.0000 g | INTRAVENOUS | Status: DC
  Administered 2020-10-07: 1 g via INTRAVENOUS
  Filled 2020-10-07: qty 10
  Filled 2020-10-07: qty 1

## 2020-10-07 MED ORDER — ACETAMINOPHEN 650 MG RE SUPP: 650.0000 mg | Freq: Four times a day (QID) | RECTAL | Status: DC | PRN

## 2020-10-07 MED ORDER — INSULIN GLARGINE 100 UNIT/ML ~~LOC~~ SOLN
25.0000 [IU] | Freq: Every day | SUBCUTANEOUS | Status: DC
  Filled 2020-10-07: qty 0.25

## 2020-10-07 MED ORDER — ENOXAPARIN SODIUM 40 MG/0.4ML IJ SOSY
40.0000 mg | PREFILLED_SYRINGE | INTRAMUSCULAR | Status: DC
  Administered 2020-10-07: 40 mg via SUBCUTANEOUS
  Filled 2020-10-07: qty 0.4

## 2020-10-07 MED ORDER — BUDESONIDE 0.25 MG/2ML IN SUSP
0.2500 mg | Freq: Two times a day (BID) | RESPIRATORY_TRACT | Status: DC
  Administered 2020-10-07 – 2020-10-08 (×2): 0.25 mg via RESPIRATORY_TRACT
  Filled 2020-10-07 (×2): qty 2

## 2020-10-07 MED ORDER — ALBUTEROL SULFATE (2.5 MG/3ML) 0.083% IN NEBU: 2.5000 mg | INHALATION_SOLUTION | Freq: Four times a day (QID) | RESPIRATORY_TRACT | Status: DC

## 2020-10-07 MED ORDER — COVID-19 MRNA VACC (MODERNA) 50 MCG/0.25ML IM SUSP
0.2500 mL | Freq: Once | INTRAMUSCULAR | Status: AC
  Administered 2020-10-08: 0.25 mL via INTRAMUSCULAR
  Filled 2020-10-07: qty 0.25

## 2020-10-07 MED ORDER — INSULIN GLARGINE 100 UNIT/ML ~~LOC~~ SOLN
30.0000 [IU] | Freq: Every day | SUBCUTANEOUS | Status: DC
  Administered 2020-10-07: 30 [IU] via SUBCUTANEOUS
  Filled 2020-10-07 (×3): qty 0.3

## 2020-10-07 MED ORDER — ACETAMINOPHEN 325 MG PO TABS: 650.0000 mg | ORAL_TABLET | Freq: Four times a day (QID) | ORAL | Status: DC | PRN

## 2020-10-07 MED ORDER — LACTATED RINGERS IV BOLUS
500.0000 mL | Freq: Once | INTRAVENOUS | Status: AC
  Administered 2020-10-07: 500 mL via INTRAVENOUS

## 2020-10-07 NOTE — ED Notes (Signed)
To CT via stretcher with RN. Pt tolerated well

## 2020-10-07 NOTE — H&P (Signed)
History and Physical    Travis [name] YYT:[id number] DOB: 22-May-1955 DOA: 10/06/2020  PCP: Brock Ra, PA-C  Patient coming from: Home.  Chief Complaint: Shortness of breath and cough with chest pain.  HPI: Travis [name] is a 65 y.o. male with history of COPD, lung cancer status post chemoradiation followed at [facility], diabetes mellitus type 2, chronic diastolic CHF, chronic kidney disease stage III baseline creatinine around 3 presents to the ER with complaints of having 3 days of nonproductive cough producing some chest pain across the chest and doing swing with shortness of breath.  ED Course: In the ER patient was found to be wheezing and hypoxic requiring 2 L oxygen to maintain sats.  CT angiogram of the chest was negative for pulmonary embolism.  Labs show hemoglobin of 11.4 BNP 26.7 creatinine 2.14 blood glucose 188 COVID test was negative.  EKG shows nonspecific ST-T changes.  Patient was given steroids nebulizer treatment admitted for COPD exacerbation requiring oxygen.  Review of Systems: As per HPI, rest all negative.   Past Medical History:  Diagnosis Date   Arthritis    Cancer ([place])    lung   Chronic kidney disease    Diabetes mellitus    TYPE II   Dyspnea    at times   Hypertension    Pneumonia    "years ago"    Past Surgical History:  Procedure Laterality Date   COLONOSCOPY W/ POLYPECTOMY     IR IMAGING GUIDED PORT INSERTION  05/05/2018   NASAL FRACTURE SURGERY     VIDEO BRONCHOSCOPY WITH ENDOBRONCHIAL ULTRASOUND Right 04/20/2018   Procedure: VIDEO BRONCHOSCOPY WITH ENDOBRONCHIAL ULTRASOUND;  Surgeon: Juanito Doom, MD;  Location: [facility];  Service: Thoracic;  Laterality: Right;     reports that he quit smoking about 2 years ago. His smoking use included cigarettes. He has a 45.00 pack-year smoking history. He quit smokeless tobacco use about 47 years ago.  His smokeless tobacco use included chew. He reports previous alcohol use. He reports that he does  not use drugs.  No Known Allergies  Family History  Problem Relation Age of Onset   Leukemia Sister    Cancer Brother    Diabetes Mother     Prior to Admission medications   Medication Sig Start Date End Date Taking? Authorizing Provider  albuterol (VENTOLIN HFA) 108 (90 Base) MCG/ACT inhaler Inhale 2 puffs into the lungs every 6 (six) hours as needed for wheezing or shortness of breath. 07/09/18   Tish Men, MD  amitriptyline (ELAVIL) 50 MG tablet Take 50 mg by mouth at bedtime.    [provider]  amLODipine (NORVASC) 5 MG tablet Take 5 mg by mouth daily. 04/10/18   [provider]  budesonide-formoterol (SYMBICORT) 160-4.5 MCG/ACT inhaler Inhale 2 puffs into the lungs 2 (two) times daily. 07/09/18   Tish Men, MD  DULoxetine (CYMBALTA) 30 MG capsule Take 30 mg by mouth daily. 09/13/19   [provider]  famotidine (PEPCID) 20 MG tablet Take 20 mg by mouth 2 (two) times daily. 03/11/19   [provider]  furosemide (LASIX) 40 MG tablet Take 40 mg by mouth 2 (two) times daily. 03/25/19   [provider]  gabapentin (NEURONTIN) 300 MG capsule Take 2 capsules by mouth twice daily Patient taking differently: Take 300 mg by mouth 2 (two) times daily.  12/25/18   Tish Men, MD  glimepiride (AMARYL) 2 MG tablet Take 2 mg by mouth daily with breakfast.  08/01/18  [provider]  glucose blood test strip 4x a day 07/01/18   Shamleffer, Melanie Crazier, MD  HYDROcodone-acetaminophen (NORCO) 5-325 MG tablet Take 1 tablet by mouth every 6 (six) hours as needed for severe pain. 10/09/19   Molpus, John, MD  insulin aspart (NOVOLOG) 100 UNIT/ML FlexPen Inject 6-18 Units into the skin See admin instructions. BS 180-200 add extra 2 units BS 201-250 add 5 units BS 251-300 add 7 units 301-350 add 10 units 351-400 add 12 units  bs >400 call PCP 03/25/19   [provider]  Insulin Glargine (LANTUS) 100 UNIT/ML Solostar Pen Inject 25 Units into the skin at  bedtime. 03/22/19 04/21/19  Domenic Polite, MD  Insulin Pen Needle (BD PEN NEEDLE NANO U/F) 32G X 4 MM MISC 4x daily 07/01/18   Shamleffer, Melanie Crazier, MD  lidocaine (LIDODERM) 5 % Place 1 patch onto the skin daily. Remove & Discard patch within 12 hours or as directed by MD 06/12/19   Eustaquio Maize, PA-C  lidocaine (XYLOCAINE) 2 % solution Patient: Mix 1part 2% viscous lidocaine, 1part H20. Swish & swallow 43mL of diluted mixture, 68min before meals and at bedtime, up to QID Patient taking differently: Use as directed 15 mLs in the mouth or throat 4 (four) times daily as needed for mouth pain. Patient: Mix 1part 2% viscous lidocaine, 1part H20. Swish & swallow 23mL of diluted mixture, 30min before meals and at bedtime, up to QID 05/29/18   Tish Men, MD  losartan (COZAAR) 25 MG tablet Take 25 mg by mouth daily. 09/03/19   [provider]  magnesium oxide (MAG-OX) 400 (241.3 Mg) MG tablet Take 1 tablet by mouth 2 (two) times daily. 03/15/19   [provider]  metoprolol succinate (TOPROL-XL) 50 MG 24 hr tablet Take 50 mg by mouth daily. 03/13/19   [provider]  morphine (MSIR) 15 MG tablet Take 15 mg by mouth every 8 (eight) hours as needed for pain. 01/22/19   [provider]  ondansetron (ZOFRAN ODT) 4 MG disintegrating tablet Take 1 tablet (4 mg total) by mouth every 8 (eight) hours as needed for nausea or vomiting. 03/02/20   Lucrezia Starch, MD  pantoprazole (PROTONIX) 40 MG tablet Take 40 mg by mouth daily. 03/05/19   [provider]  polyethylene glycol (MIRALAX / GLYCOLAX) 17 g packet Take 17 g by mouth daily. 04/03/19   Alma Friendly, MD  potassium chloride (KLOR-CON) 10 MEQ tablet Take 10 mEq by mouth daily. 03/11/19   [provider]  rosuvastatin (CRESTOR) 10 MG tablet Take 10 mg by mouth daily. 09/03/19   [provider]  torsemide (DEMADEX) 20 MG tablet Take 1 tablet (20 mg total) by mouth 2 (two) times daily.  03/24/19   Domenic Polite, MD  traMADol (ULTRAM) 50 MG tablet Take 1 tablet (50 mg total) by mouth every 8 (eight) hours as needed for moderate pain. 09/04/18   Tish Men, MD  TRELEGY ELLIPTA 100-62.5-25 MCG/INH AEPB Inhale 1 puff into the lungs daily. 02/26/19   [provider]  prochlorperazine (COMPAZINE) 10 MG tablet Take 1 tablet (10 mg total) by mouth every 6 (six) hours as needed (Nausea or vomiting). 04/24/18 08/04/18  Tish Men, MD    Physical Exam: Constitutional: Moderately built and nourished. Vitals:   10/07/20 1000 10/07/20 1227 10/07/20 1357 10/07/20 1401  BP: 127/68 128/65    Pulse: 96 97    Resp: 18 20    Temp:  97.8 F (36.6 C)  TempSrc:  Oral    SpO2: 96% 100% 99% 99%  Weight:      Height:       Eyes: Anicteric no pallor. ENMT: No discharge from the ears eyes nose and mouth. Neck: No JVD appreciated no mass felt. Respiratory: Mild expiratory wheeze and no crepitations. Cardiovascular: S1-S2 heard. Abdomen: Soft nontender bowel sound present. Musculoskeletal: Left great toe has discoloration. Skin: Left great toe has discoloration. Neurologic: Alert awake oriented to time place and person.  Moves all extremities. Psychiatric: Appears normal.  Normal affect.   Labs on Admission: I have personally reviewed following labs and imaging studies  CBC: Recent Labs  Lab 10/06/20 2308  WBC 7.4  NEUTROABS 4.4  HGB 11.4*  HCT 34.8*  MCV 91.6  PLT 767   Basic Metabolic Panel: Recent Labs  Lab 10/06/20 2308  NA 135  K 3.9  CL 103  CO2 24  GLUCOSE 188*  BUN 30*  CREATININE 2.14*  CALCIUM 8.8*   GFR: Estimated Creatinine Clearance: 41.3 mL/min (A) (by C-G formula based on SCr of 2.14 mg/dL (H)). Liver Function Tests: Recent Labs  Lab 10/06/20 2308  AST 21  ALT 20  ALKPHOS 95  BILITOT 0.9  PROT 7.4  ALBUMIN 4.1   No results for input(s): LIPASE, AMYLASE in the last 168 hours. No results for input(s): AMMONIA in the last 168  hours. Coagulation Profile: No results for input(s): INR, PROTIME in the last 168 hours. Cardiac Enzymes: No results for input(s): CKTOTAL, CKMB, CKMBINDEX, TROPONINI in the last 168 hours. BNP (last 3 results) No results for input(s): PROBNP in the last 8760 hours. HbA1C: No results for input(s): HGBA1C in the last 72 hours. CBG: No results for input(s): GLUCAP in the last 168 hours. Lipid Profile: No results for input(s): CHOL, HDL, LDLCALC, TRIG, CHOLHDL, LDLDIRECT in the last 72 hours. Thyroid Function Tests: No results for input(s): TSH, T4TOTAL, FREET4, T3FREE, THYROIDAB in the last 72 hours. Anemia Panel: No results for input(s): VITAMINB12, FOLATE, FERRITIN, TIBC, IRON, RETICCTPCT in the last 72 hours. Urine analysis:    Component Value Date/Time   COLORURINE YELLOW 06/15/2020 1516   APPEARANCEUR CLEAR 06/15/2020 1516   LABSPEC 1.025 06/15/2020 1516   PHURINE 5.5 06/15/2020 1516   GLUCOSEU >=500 (A) 06/15/2020 1516   HGBUR MODERATE (A) 06/15/2020 1516   BILIRUBINUR NEGATIVE 06/15/2020 1516   KETONESUR NEGATIVE 06/15/2020 1516   PROTEINUR 100 (A) 06/15/2020 1516   NITRITE NEGATIVE 06/15/2020 1516   LEUKOCYTESUR NEGATIVE 06/15/2020 1516   Sepsis Labs: @LABRCNTIP (procalcitonin:4,lacticidven:4) ) Recent Results (from the past 240 hour(s))  Resp Panel by RT-PCR (Flu A&B, Covid) Nasopharyngeal Swab     Status: None   Collection Time: 10/07/20  4:22 AM   Specimen: Nasopharyngeal Swab; Nasopharyngeal(NP) swabs in vial transport medium  Result Value Ref Range Status   SARS Coronavirus 2 by RT PCR NEGATIVE NEGATIVE Final    Comment: (NOTE) SARS-CoV-2 target nucleic acids are NOT DETECTED.  The SARS-CoV-2 RNA is generally detectable in upper respiratory specimens during the acute phase of infection. The lowest concentration of SARS-CoV-2 viral copies this assay can detect is 138 copies/mL. A negative result does not preclude SARS-Cov-2 infection and should not be used as  the sole basis for treatment or other patient management decisions. A negative result may occur with  improper specimen collection/handling, submission of specimen other than nasopharyngeal swab, presence of viral mutation(s) within the areas targeted by this assay, and inadequate number of viral copies(<138 copies/mL). A negative  result must be combined with clinical observations, patient history, and epidemiological information. The expected result is Negative.  Fact Sheet for Patients:  EntrepreneurPulse.com.au  Fact Sheet for Healthcare Providers:  IncredibleEmployment.be  This test is no t yet approved or cleared by the [place] FDA and  has been authorized for detection and/or diagnosis of SARS-CoV-2 by FDA under an Emergency Use Authorization (EUA). This EUA will remain  in effect (meaning this test can be used) for the duration of the COVID-19 declaration under Section 564(b)(1) of the Act, 21 U.S.C.section 360bbb-3(b)(1), unless the authorization is terminated  or revoked sooner.       Influenza A by PCR NEGATIVE NEGATIVE Final   Influenza B by PCR NEGATIVE NEGATIVE Final    Comment: (NOTE) The Xpert Xpress SARS-CoV-2/FLU/RSV plus assay is intended as an aid in the diagnosis of influenza from Nasopharyngeal swab specimens and should not be used as a sole basis for treatment. Nasal washings and aspirates are unacceptable for Xpert Xpress SARS-CoV-2/FLU/RSV testing.  Fact Sheet for Patients: EntrepreneurPulse.com.au  Fact Sheet for Healthcare Providers: IncredibleEmployment.be  This test is not yet approved or cleared by the [place] FDA and has been authorized for detection and/or diagnosis of SARS-CoV-2 by FDA under an Emergency Use Authorization (EUA). This EUA will remain in effect (meaning this test can be used) for the duration of the COVID-19 declaration under Section 564(b)(1) of  the Act, 21 U.S.C. section 360bbb-3(b)(1), unless the authorization is terminated or revoked.  Performed at [facility], [address] [place], [place] [address]      Radiological Exams on Admission: CT Angio Chest PE W and/or Wo Contrast  Result Date: 10/07/2020 CLINICAL DATA:  Dyspnea, hypoxia, history of lung cancer. EXAM: CT ANGIOGRAPHY CHEST WITH CONTRAST TECHNIQUE: Multidetector CT imaging of the chest was performed using the standard protocol during bolus administration of intravenous contrast. Multiplanar CT image reconstructions and MIPs were obtained to evaluate the vascular anatomy. CONTRAST:  170mL OMNIPAQUE IOHEXOL 350 MG/ML SOLN COMPARISON:  08/31/2020, 06/15/2020, 10/09/2019 FINDINGS: Cardiovascular: There is adequate opacification of the pulmonary arterial tree. No intraluminal filling defect identified to suggest acute pulmonary embolism. The central pulmonary arteries are of normal caliber. Left internal jugular central venous catheter tip noted within the superior cavoatrial junction. No significant coronary artery calcification. Global cardiac size within normal limits. No pericardial effusion. Mild atherosclerotic calcification within the thoracic aorta. No aortic aneurysm. Mediastinum/Nodes: The visualized thyroid is unremarkable. No pathologic thoracic adenopathy. The esophagus is unremarkable. Lungs/Pleura: There is again seen dense consolidation involving the superior segment of the right lower lobe as well as thickening of the peribronchovascular interstitium of the right upper lobe centrally and right-sided volume loss in keeping with post radiation change. No superimposed confluent pulmonary infiltrate. There is multiple noncalcified pulmonary nodules noted within the right lung which are stable since immediate prior examination measuring 4 mm within the right upper lobe at axial image # 32 and 6 mm within the right lower lobe at axial image # 58. Nodule within  the left upper lobe anteriorly at axial image # 41 is stable since remote prior examination of 07/02/2018 and is safely considered benign. No pneumothorax or pleural effusion. No central obstructing lesion. Upper Abdomen: No acute abnormality. Musculoskeletal: No chest wall abnormality. No acute or significant osseous findings. Review of the MIP images confirms the above findings. IMPRESSION: No acute pulmonary embolism. Stable post radiation changes involving the a right lower lobe and right upper lobe with  associated right-sided volume loss. No evidence of recurrence or residual disease within the thorax. Multiple right-sided indeterminate pulmonary nodule stable since remote prior examination of 10/09/2019. Repeat CT scan in 12 months (from today's scan) is recommended for high-risk patients. This recommendation follows the consensus statement: Guidelines for Management of Incidental Pulmonary Nodules Detected on CT Images: From the Fleischner Society 2017; Radiology 2017; 284:228-243. Aortic Atherosclerosis (ICD10-I70.0). Electronically Signed   By: Fidela Salisbury MD   On: 10/07/2020 02:05   DG Chest Portable 1 View  Result Date: 10/06/2020 CLINICAL DATA:  Shortness of breath EXAM: PORTABLE CHEST 1 VIEW COMPARISON:  06/15/2020 CT 08/31/2020 FINDINGS: Left-sided central venous port tip over the SVC. Stable right hilar density and distortion. No acute superimposed airspace disease. Normal cardiac size. No pneumothorax. IMPRESSION: Stable right hilar density and distortion. No acute interval change compared to prior radiograph Electronically Signed   By: Donavan Foil M.D.   On: 10/06/2020 23:37    EKG: Independently reviewed.  Normal sinus rhythm with nonspecific changes.  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  We will continue with nebulizer Pulmicort and keep patient on ceftriaxone. Diabetes mellitus type 2 on Lantus insulin.  20 scale coverage. History of diastolic CHF holding patient's torsemide at this time.  Patient does not look to be in CHF.  And also has received IV contrast so we will closely follow renal function. History of hypertension we will keep patient on Toprol and.  IV hydralazine holding Cozaar due to patient receiving IV contrast and need to follow his creatinine. Chronic kidney disease stage III creatinine appears to be at baseline when compared to the 1 in June 2022 in [place]. History of lung cancer being followed at [facility].  Status post chemo and radiation. Anemia appears to be chronic follow CBC.  Since patient has respiratory failure with requiring oxygen with COPD exacerbation with multiple comorbidities will need inpatient status.  DVT prophylaxis: Lovenox. Code Status: Full code. Family Communication: Discussed with patient. Disposition Plan: Home. Consults called: None. Admission status: Inpatient.   [name] MD [facility] Pager [phone].  If 7PM-7AM, please contact night-coverage [email or web address] Password [facility]  10/07/2020, 3:18 PM

## 2020-10-07 NOTE — Consult Note (Signed)
WOC Nurse Consult Note: Reason for Consult: Chronic, nonhealing wounds to left great toe (distal tip) and left second digit (medial aspect). Seen by Dr. Zigmund Gottron at the outpatient wound care center for these wounds since May and last seen 09/26/20. Wound type: Neuropathic Pressure Injury POA: N/A Measurement: top be obtained by bedside RN and documents on flow sheet Wound bed: red, dry Drainage (amount, consistency, odor) scant serous Periwound: dry, intact Dressing procedure/placement/frequency: I will implement a conservative POC for use while in house using xeroform gauze and a daily dressing change and encourage patient to resume dressings every other day with Ultrafoam as directed by Dr. Zigmund Gottron upon discharge.  He is to keep all scheduled follow up appointments as he is at high risk for digit and limb loss due to diabetes and neuropathy.  [facility] nursing team will not follow, but will remain available to this patient, the nursing and medical teams.  Please re-consult if needed. Thanks, Maudie Flakes, MSN, RN, Flasher, Arther Abbott  Pager# [phone]

## 2020-10-07 NOTE — ED Notes (Signed)
Cont to await for room assignment for admission, resting quietly at this time, RT currently at bedside

## 2020-10-07 NOTE — ED Notes (Signed)
Phone Handoff Report provided to [facility] at [facility]

## 2020-10-07 NOTE — ED Notes (Signed)
Phone Handoff report given to [facility]

## 2020-10-07 NOTE — ED Notes (Signed)
Pt back from CT via stretcher with RN. Pt tolerated well. Pt placed back on monitor with VS cycling.

## 2020-10-08 DIAGNOSIS — C7801 Secondary malignant neoplasm of right lung: Secondary | ICD-10-CM | POA: Diagnosis not present

## 2020-10-08 DIAGNOSIS — J441 Chronic obstructive pulmonary disease with (acute) exacerbation: Secondary | ICD-10-CM | POA: Diagnosis not present

## 2020-10-08 DIAGNOSIS — I5032 Chronic diastolic (congestive) heart failure: Secondary | ICD-10-CM

## 2020-10-08 DIAGNOSIS — N179 Acute kidney failure, unspecified: Secondary | ICD-10-CM

## 2020-10-08 DIAGNOSIS — E1165 Type 2 diabetes mellitus with hyperglycemia: Secondary | ICD-10-CM | POA: Diagnosis not present

## 2020-10-08 DIAGNOSIS — C801 Malignant (primary) neoplasm, unspecified: Secondary | ICD-10-CM

## 2020-10-08 LAB — BASIC METABOLIC PANEL
Anion gap: 9 (ref 5–15)
BUN: 38 mg/dL — ABNORMAL HIGH (ref 8–23)
CO2: 26 mmol/L (ref 22–32)
Calcium: 9 mg/dL (ref 8.9–10.3)
Chloride: 101 mmol/L (ref 98–111)
Creatinine, Ser: 2.07 mg/dL — ABNORMAL HIGH (ref 0.61–1.24)
GFR, Estimated: 35 mL/min — ABNORMAL LOW (ref >60)
Glucose, Bld: 325 mg/dL — ABNORMAL HIGH (ref 70–99)
Potassium: 4.2 mmol/L (ref 3.5–5.1)
Sodium: 136 mmol/L (ref 135–145)

## 2020-10-08 LAB — CBC
HCT: 33.2 % — ABNORMAL LOW (ref 39.0–52.0)
Hemoglobin: 10.2 g/dL — ABNORMAL LOW (ref 13.0–17.0)
MCH: 29 pg (ref 26.0–34.0)
MCHC: 30.7 g/dL (ref 30.0–36.0)
MCV: 94.3 fL (ref 80.0–100.0)
Platelets: 273 10*3/uL (ref 150–400)
RBC: 3.52 MIL/uL — ABNORMAL LOW (ref 4.22–5.81)
RDW: 14.8 % (ref 11.5–15.5)
WBC: 8.3 10*3/uL (ref 4.0–10.5)
nRBC: 0 % (ref 0.0–0.2)

## 2020-10-08 LAB — HEMOGLOBIN A1C
Hgb A1c MFr Bld: 7.2 % — ABNORMAL HIGH (ref 4.8–5.6)
Mean Plasma Glucose: 159.94 mg/dL

## 2020-10-08 LAB — GLUCOSE, CAPILLARY
Glucose-Capillary: 289 mg/dL — ABNORMAL HIGH (ref 70–99)
Glucose-Capillary: 309 mg/dL — ABNORMAL HIGH (ref 70–99)

## 2020-10-08 LAB — HIV ANTIBODY (ROUTINE TESTING W REFLEX): HIV Screen 4th Generation wRfx: NONREACTIVE

## 2020-10-08 MED ORDER — PREDNISONE 20 MG PO TABS: 40.0000 mg | ORAL_TABLET | Freq: Every day | ORAL | 0 refills | Status: AC

## 2020-10-08 MED ORDER — INSULIN GLARGINE 100 UNIT/ML SOLOSTAR PEN: 35.0000 [IU] | PEN_INJECTOR | Freq: Every day | SUBCUTANEOUS | 0 refills | Status: DC

## 2020-10-08 MED ORDER — POTASSIUM CHLORIDE ER 10 MEQ PO TBCR: 10.0000 meq | EXTENDED_RELEASE_TABLET | Freq: Every day | ORAL | Status: DC

## 2020-10-08 MED ORDER — INSULIN ASPART 100 UNIT/ML IJ SOLN: 0.0000 [IU] | Freq: Three times a day (TID) | INTRAMUSCULAR | Status: DC

## 2020-10-08 MED ORDER — INSULIN GLARGINE 100 UNIT/ML ~~LOC~~ SOLN
25.0000 [IU] | Freq: Two times a day (BID) | SUBCUTANEOUS | Status: DC
  Administered 2020-10-08: 25 [IU] via SUBCUTANEOUS
  Filled 2020-10-08: qty 0.25

## 2020-10-08 MED ORDER — INSULIN ASPART 100 UNIT/ML IJ SOLN: 0.0000 [IU] | Freq: Every day | INTRAMUSCULAR | Status: DC

## 2020-10-08 MED ORDER — EMPAGLIFLOZIN 25 MG PO TABS: 25.0000 mg | ORAL_TABLET | Freq: Every day | ORAL | Status: DC

## 2020-10-08 MED ORDER — GABAPENTIN 300 MG PO CAPS: 600.0000 mg | ORAL_CAPSULE | Freq: Two times a day (BID) | ORAL | 0 refills | Status: DC

## 2020-10-08 MED ORDER — INSULIN ASPART 100 UNIT/ML IJ SOLN
0.0000 [IU] | Freq: Three times a day (TID) | INTRAMUSCULAR | Status: DC
  Administered 2020-10-08: 8 [IU] via SUBCUTANEOUS
  Administered 2020-10-08: 11 [IU] via SUBCUTANEOUS

## 2020-10-08 MED ORDER — PREDNISONE 20 MG PO TABS: 40.0000 mg | ORAL_TABLET | Freq: Every day | ORAL | Status: DC

## 2020-10-08 MED ORDER — DOXYCYCLINE HYCLATE 100 MG PO CAPS: 100.0000 mg | ORAL_CAPSULE | Freq: Two times a day (BID) | ORAL | 0 refills | Status: AC

## 2020-10-08 MED ORDER — TORSEMIDE 20 MG PO TABS
20.0000 mg | ORAL_TABLET | Freq: Two times a day (BID) | ORAL | Status: DC
Start: 2020-10-16 — End: 2023-12-01

## 2020-10-08 MED ORDER — INSULIN ASPART 100 UNIT/ML IJ SOLN
4.0000 [IU] | Freq: Three times a day (TID) | INTRAMUSCULAR | Status: DC
  Administered 2020-10-08: 4 [IU] via SUBCUTANEOUS

## 2020-10-08 MED ORDER — INSULIN GLARGINE 100 UNIT/ML SOLOSTAR PEN: PEN_INJECTOR | SUBCUTANEOUS | 0 refills | Status: DC

## 2020-10-08 MED ORDER — LOSARTAN POTASSIUM 25 MG PO TABS: 25.0000 mg | ORAL_TABLET | Freq: Every day | ORAL | Status: DC

## 2020-10-08 NOTE — Progress Notes (Signed)
Pt alert and oriented. D/C instructions given, pt d/cd to home.

## 2020-10-08 NOTE — Discharge Summary (Signed)
Physician Discharge Summary  Travis [name] RJJ:[id number] DOB: 1955-05-14 DOA: 10/06/2020  PCP: Brock Ra, PA-C  Admit date: 10/06/2020 Discharge date: 10/08/2020  Admitted From: Home Disposition: Home  Recommendations for Outpatient Follow-up:  Follow ups as below. Please obtain CBC/BMP/Mag at follow up Please follow up on the following pending results: None  Home Health: None indicated Equipment/Devices: Not indicated  Discharge Condition: Stable CODE STATUS: Full code  Hospital Course: 65 year old M with PMH of COPD not on oxygen, lung cancer s/p chemoradiation at [place], DM-2, diastolic CHF, KZS-0F and diabetic wound presenting with dry cough, shortness of breath and chest pain with cough, and admitted for COPD exacerbation and AKI on CKD-3A.  CTA chest negative for PE or pneumonia.  BNP and high-sensitivity troponin within normal.  COVID-19 PCR negative.  Patient was started on steroid, antibiotics, breathing treatments and IV fluid and admitted.  Eventually, respiratory symptoms and AKI improved, and he felt well and ready to go home.  Patient is discharged on prednisone for 2 more days and doxycycline for 4 more days.  Advised to hold his Jardiance, torsemide, losartan and potassium for 1 week until he follows up with his PCP and have his kidney numbers rechecked.  He was encouraged to keep good hydration to help with AKI and prevent DKA while on prednisone.  He is also advised to use Lantus 35 units twice daily, and and go back to 35 units daily after that.   See individual problem list below for more on  hospital course.  Discharge Diagnoses:  COPD exacerbation: Liberated of oxygen.  Improved. -Discharged on prednisone for 2 more days-short course given history of diabetes -P.o. doxycycline for 4 more days -Continue home Trelegy Ellipta and rescue inhalers  DM-2 with hyperglycemia and diabetic wound: No signs of infection.  CBG elevated due to steroid.  A1c 7.2%. -Lantus  at 35 units twice daily for the next 2 days while on steroid, then 35 units daily -Continue home glimepiride -Hold Jardiance for 1 week -Encourage good hydration -Holding diuretics -Follow-up with PCP in 3 to 4 days -Daily dressing as recommended by [facility] RN-see recommendation below  Essential hypertension Chronic diastolic CHF: Appears euvolemic. -Advised to hold torsemide and potassium for 1 week  AKI on CKD-3A: Likely due to diuretics and losartan.  Improved. -Advised to hold torsemide and losartan for 1 week -Follow-up with PCP in 3 to 4 days -Recheck renal function at follow-up  History of lung cancer s/p chemoradiation at [facility] -Outpatient follow-up with his oncologist  Anemia of chronic disease: H&H relatively stable -Repeat CBC at follow-up   Body mass index is 28.87 kg/m.            Discharge Exam: Vitals:   10/08/20 0531 10/08/20 0924  BP: 139/70 (!) 143/75  Pulse: 88 93  Resp: 18 18  Temp: 98.2 F (36.8 C) 97.6 F (36.4 C)  SpO2: 95% 96%    GENERAL: No apparent distress.  Nontoxic. HEENT: MMM.  Vision and hearing grossly intact.  NECK: Supple.  No apparent JVD.  RESP: On RA no IWOB.  Fair aeration bilaterally. CVS:  RRR. Heart sounds normal.  ABD/GI/GU: Bowel sounds present. Soft. Non tender.  MSK/EXT:  Moves extremities. No apparent deformity. No edema.  SKIN: Diabetic/neuropathic wound over left great toe and second toe without signs of infection NEURO: Awake, alert and oriented appropriately.  No apparent focal neuro deficit. PSYCH: Calm. Normal affect.   Discharge Instructions  Discharge Instructions     Call MD  for:  difficulty breathing, headache or visual disturbances   Complete by: As directed    Call MD for:  extreme fatigue   Complete by: As directed    Call MD for:  persistant dizziness or light-headedness   Complete by: As directed    Call MD for:  persistant nausea and vomiting   Complete by: As directed    Call MD for:   severe uncontrolled pain   Complete by: As directed    Call MD for:  temperature >100.4   Complete by: As directed    Diet - low sodium heart healthy   Complete by: As directed    Diet Carb Modified   Complete by: As directed    Discharge instructions   Complete by: As directed    It has been a pleasure taking care of you!  You were hospitalized due to COPD exacerbation and acute kidney injury.  We have started you on steroid, antibiotic and breathing treatments.  With that,  your breathing has improved.  We are discharging you on more prednisone and antibiotic to complete treatment course.  Continue using your breathing treatments.   In regards to your kidney injury, we recommend holding losartan, torsemide, Jardiance and potassium until you follow-up with your primary care doctor next week.  Your primary care doctor can decide when to restart those medications.  Meanwhile, recommend good hydration over the next couple of days while you are on steroid.  Please review your new medication list and the directions on your medications before you take them.  Please call your primary care doctor office as soon as possible to schedule hospital follow-up in a week.   Take care,   Discharge wound care:   Complete by: As directed    Wound care to left foot, Great and 2nd digit full thickness wounds:   Cleanse with NS, apt gently dry. Cover wounds with size appropriate piece of xeroform gauze Kellie Simmering # 294). Top with dry gauze 2x2 and secure with conform bandaging/paper tape. Change daily.   Increase activity slowly   Complete by: As directed       Allergies as of 10/08/2020   No Known Allergies      Medication List     STOP taking these medications    HYDROcodone-acetaminophen 5-325 MG tablet Commonly known as: Norco       TAKE these medications    albuterol 108 (90 Base) MCG/ACT inhaler Commonly known as: VENTOLIN HFA Inhale 2 puffs into the lungs every 6 (six) hours as  needed for wheezing or shortness of breath.   amitriptyline 50 MG tablet Commonly known as: ELAVIL Take 50 mg by mouth at bedtime.   amLODipine 5 MG tablet Commonly known as: NORVASC Take 5 mg by mouth daily.   budesonide-formoterol 160-4.5 MCG/ACT inhaler Commonly known as: Symbicort Inhale 2 puffs into the lungs 2 (two) times daily.   doxycycline 100 MG capsule Commonly known as: VIBRAMYCIN Take 1 capsule (100 mg total) by mouth 2 (two) times daily for 4 days.   DULoxetine 30 MG capsule Commonly known as: CYMBALTA Take 30 mg by mouth daily.   empagliflozin 25 MG Tabs tablet Commonly known as: JARDIANCE Take 1 tablet (25 mg total) by mouth daily. Start taking on: October 16, 2020 What changed: These instructions start on October 16, 2020. If you are unsure what to do until then, ask your doctor or other care provider.   famotidine 20 MG tablet Commonly known as: PEPCID Take 20  mg by mouth daily.   gabapentin 300 MG capsule Commonly known as: NEURONTIN Take 2 capsules (600 mg total) by mouth 2 (two) times daily. What changed:  how much to take when to take this   glimepiride 2 MG tablet Commonly known as: AMARYL Take 2 mg by mouth daily with breakfast.   glucose blood test strip 4x a day   insulin aspart 100 UNIT/ML FlexPen Commonly known as: NOVOLOG Inject 6-16 Units into the skin See admin instructions. BS 180-200 add extra 2 units BS 201-250 add 5 units BS 251-300 add 7 units 301-350 add 10 units 351-400 add 12 units  bs >400 call PCP   insulin glargine 100 UNIT/ML Solostar Pen Commonly known as: LANTUS Inject 35 Units into the skin at bedtime.   Insulin Pen Needle 32G X 4 MM Misc Commonly known as: BD Pen Needle Nano U/F 4x daily   ketoconazole 2 % cream Commonly known as: NIZORAL Apply 1 application topically 2 (two) times daily as needed for irritation.   lidocaine 5 % Commonly known as: Lidoderm Place 1 patch onto the skin daily. Remove & Discard patch  within 12 hours or as directed by MD   losartan 25 MG tablet Commonly known as: COZAAR Take 1 tablet (25 mg total) by mouth daily. Start taking on: October 16, 2020 What changed: These instructions start on October 16, 2020. If you are unsure what to do until then, ask your doctor or other care provider.   magnesium oxide 400 (241.3 Mg) MG tablet Commonly known as: MAG-OX Take 1 tablet by mouth 2 (two) times daily.   pantoprazole 40 MG tablet Commonly known as: PROTONIX Take 40 mg by mouth daily.   potassium chloride 10 MEQ tablet Commonly known as: KLOR-CON Take 1 tablet (10 mEq total) by mouth daily. Start taking on: October 16, 2020 What changed: These instructions start on October 16, 2020. If you are unsure what to do until then, ask your doctor or other care provider.   predniSONE 20 MG tablet Commonly known as: DELTASONE Take 2 tablets (40 mg total) by mouth daily with breakfast for 2 days. Start taking on: October 09, 2020   rosuvastatin 10 MG tablet Commonly known as: CRESTOR Take 10 mg by mouth daily.   torsemide 20 MG tablet Commonly known as: DEMADEX Take 1 tablet (20 mg total) by mouth 2 (two) times daily. Start taking on: October 16, 2020 What changed: These instructions start on October 16, 2020. If you are unsure what to do until then, ask your doctor or other care provider.   traMADol 50 MG tablet Commonly known as: ULTRAM Take 1 tablet (50 mg total) by mouth every 8 (eight) hours as needed for moderate pain.   Trelegy Ellipta 100-62.5-25 MCG/INH Aepb Generic drug: Fluticasone-Umeclidin-Vilant Inhale 1 puff into the lungs daily.   Vitamin D (Ergocalciferol) 1.25 MG (50000 UNIT) Caps capsule Commonly known as: DRISDOL Take 50,000 Units by mouth once a week.       ASK your doctor about these medications    lidocaine 2 % solution Commonly known as: XYLOCAINE Patient: Mix 1part 2% viscous lidocaine, 1part H20. Swish & swallow 73mL of diluted mixture, 45min before meals  and at bedtime, up to QID   morphine 15 MG tablet Commonly known as: MSIR Take 15 mg by mouth every 8 (eight) hours as needed for pain.   ondansetron 4 MG disintegrating tablet Commonly known as: Zofran ODT Take 1 tablet (4 mg total) by mouth  every 8 (eight) hours as needed for nausea or vomiting.   polyethylene glycol 17 g packet Commonly known as: MIRALAX / GLYCOLAX Take 17 g by mouth daily.               Discharge Care Instructions  (From admission, onward)           Start     Ordered   10/08/20 0000  Discharge wound care:       Comments: Wound care to left foot, Great and 2nd digit full thickness wounds:   Cleanse with NS, apt gently dry. Cover wounds with size appropriate piece of xeroform gauze Kellie Simmering # 294). Top with dry gauze 2x2 and secure with conform bandaging/paper tape. Change daily.   10/08/20 1140            Consultations: None  Procedures/Studies:   CT Angio Chest PE W and/or Wo Contrast  Result Date: 10/07/2020 CLINICAL DATA:  Dyspnea, hypoxia, history of lung cancer. EXAM: CT ANGIOGRAPHY CHEST WITH CONTRAST TECHNIQUE: Multidetector CT imaging of the chest was performed using the standard protocol during bolus administration of intravenous contrast. Multiplanar CT image reconstructions and MIPs were obtained to evaluate the vascular anatomy. CONTRAST:  165mL OMNIPAQUE IOHEXOL 350 MG/ML SOLN COMPARISON:  08/31/2020, 06/15/2020, 10/09/2019 FINDINGS: Cardiovascular: There is adequate opacification of the pulmonary arterial tree. No intraluminal filling defect identified to suggest acute pulmonary embolism. The central pulmonary arteries are of normal caliber. Left internal jugular central venous catheter tip noted within the superior cavoatrial junction. No significant coronary artery calcification. Global cardiac size within normal limits. No pericardial effusion. Mild atherosclerotic calcification within the thoracic aorta. No aortic aneurysm.  Mediastinum/Nodes: The visualized thyroid is unremarkable. No pathologic thoracic adenopathy. The esophagus is unremarkable. Lungs/Pleura: There is again seen dense consolidation involving the superior segment of the right lower lobe as well as thickening of the peribronchovascular interstitium of the right upper lobe centrally and right-sided volume loss in keeping with post radiation change. No superimposed confluent pulmonary infiltrate. There is multiple noncalcified pulmonary nodules noted within the right lung which are stable since immediate prior examination measuring 4 mm within the right upper lobe at axial image # 32 and 6 mm within the right lower lobe at axial image # 58. Nodule within the left upper lobe anteriorly at axial image # 41 is stable since remote prior examination of 07/02/2018 and is safely considered benign. No pneumothorax or pleural effusion. No central obstructing lesion. Upper Abdomen: No acute abnormality. Musculoskeletal: No chest wall abnormality. No acute or significant osseous findings. Review of the MIP images confirms the above findings. IMPRESSION: No acute pulmonary embolism. Stable post radiation changes involving the a right lower lobe and right upper lobe with associated right-sided volume loss. No evidence of recurrence or residual disease within the thorax. Multiple right-sided indeterminate pulmonary nodule stable since remote prior examination of 10/09/2019. Repeat CT scan in 12 months (from today's scan) is recommended for high-risk patients. This recommendation follows the consensus statement: Guidelines for Management of Incidental Pulmonary Nodules Detected on CT Images: From the Fleischner Society 2017; Radiology 2017; 284:228-243. Aortic Atherosclerosis (ICD10-I70.0). Electronically Signed   By: Fidela Salisbury MD   On: 10/07/2020 02:05   DG Chest Portable 1 View  Result Date: 10/06/2020 CLINICAL DATA:  Shortness of breath EXAM: PORTABLE CHEST 1 VIEW  COMPARISON:  06/15/2020 CT 08/31/2020 FINDINGS: Left-sided central venous port tip over the SVC. Stable right hilar density and distortion. No acute superimposed airspace disease. Normal cardiac  size. No pneumothorax. IMPRESSION: Stable right hilar density and distortion. No acute interval change compared to prior radiograph Electronically Signed   By: Donavan Foil M.D.   On: 10/06/2020 23:37       The results of significant diagnostics from this hospitalization (including imaging, microbiology, ancillary and laboratory) are listed below for reference.     Microbiology: Recent Results (from the past 240 hour(s))  Resp Panel by RT-PCR (Flu A&B, Covid) Nasopharyngeal Swab     Status: None   Collection Time: 10/07/20  4:22 AM   Specimen: Nasopharyngeal Swab; Nasopharyngeal(NP) swabs in vial transport medium  Result Value Ref Range Status   SARS Coronavirus 2 by RT PCR NEGATIVE NEGATIVE Final    Comment: (NOTE) SARS-CoV-2 target nucleic acids are NOT DETECTED.  The SARS-CoV-2 RNA is generally detectable in upper respiratory specimens during the acute phase of infection. The lowest concentration of SARS-CoV-2 viral copies this assay can detect is 138 copies/mL. A negative result does not preclude SARS-Cov-2 infection and should not be used as the sole basis for treatment or other patient management decisions. A negative result may occur with  improper specimen collection/handling, submission of specimen other than nasopharyngeal swab, presence of viral mutation(s) within the areas targeted by this assay, and inadequate number of viral copies(<138 copies/mL). A negative result must be combined with clinical observations, patient history, and epidemiological information. The expected result is Negative.  Fact Sheet for Patients:  EntrepreneurPulse.com.au  Fact Sheet for Healthcare Providers:  IncredibleEmployment.be  This test is no t yet approved or  cleared by the [place] FDA and  has been authorized for detection and/or diagnosis of SARS-CoV-2 by FDA under an Emergency Use Authorization (EUA). This EUA will remain  in effect (meaning this test can be used) for the duration of the COVID-19 declaration under Section 564(b)(1) of the Act, 21 U.S.C.section 360bbb-3(b)(1), unless the authorization is terminated  or revoked sooner.       Influenza A by PCR NEGATIVE NEGATIVE Final   Influenza B by PCR NEGATIVE NEGATIVE Final    Comment: (NOTE) The Xpert Xpress SARS-CoV-2/FLU/RSV plus assay is intended as an aid in the diagnosis of influenza from Nasopharyngeal swab specimens and should not be used as a sole basis for treatment. Nasal washings and aspirates are unacceptable for Xpert Xpress SARS-CoV-2/FLU/RSV testing.  Fact Sheet for Patients: EntrepreneurPulse.com.au  Fact Sheet for Healthcare Providers: IncredibleEmployment.be  This test is not yet approved or cleared by the [place] FDA and has been authorized for detection and/or diagnosis of SARS-CoV-2 by FDA under an Emergency Use Authorization (EUA). This EUA will remain in effect (meaning this test can be used) for the duration of the COVID-19 declaration under Section 564(b)(1) of the Act, 21 U.S.C. section 360bbb-3(b)(1), unless the authorization is terminated or revoked.  Performed at [facility], [address] [place], [place] [address]      Labs:  CBC: Recent Labs  Lab 10/06/20 2308 10/08/20 0410  WBC 7.4 8.3  NEUTROABS 4.4  --   HGB 11.4* 10.2*  HCT 34.8* 33.2*  MCV 91.6 94.3  PLT 297 273   BMP &GFR Recent Labs  Lab 10/06/20 2308 10/08/20 0410  NA 135 136  K 3.9 4.2  CL 103 101  CO2 24 26  GLUCOSE 188* 325*  BUN 30* 38*  CREATININE 2.14* 2.07*  CALCIUM 8.8* 9.0   Estimated Creatinine Clearance: 42.7 mL/min (A) (by C-G formula based on SCr of 2.07 mg/dL (H)). Liver &  Pancreas: Recent Labs  Lab 10/06/20 2308  AST 21  ALT 20  ALKPHOS 95  BILITOT 0.9  PROT 7.4  ALBUMIN 4.1   No results for input(s): LIPASE, AMYLASE in the last 168 hours. No results for input(s): AMMONIA in the last 168 hours. Diabetic: No results for input(s): HGBA1C in the last 72 hours. Recent Labs  Lab 10/07/20 1655 10/07/20 2107 10/08/20 0722  GLUCAP 397* 313* 289*   Cardiac Enzymes: No results for input(s): CKTOTAL, CKMB, CKMBINDEX, TROPONINI in the last 168 hours. No results for input(s): PROBNP in the last 8760 hours. Coagulation Profile: No results for input(s): INR, PROTIME in the last 168 hours. Thyroid Function Tests: No results for input(s): TSH, T4TOTAL, FREET4, T3FREE, THYROIDAB in the last 72 hours. Lipid Profile: No results for input(s): CHOL, HDL, LDLCALC, TRIG, CHOLHDL, LDLDIRECT in the last 72 hours. Anemia Panel: No results for input(s): VITAMINB12, FOLATE, FERRITIN, TIBC, IRON, RETICCTPCT in the last 72 hours. Urine analysis:    Component Value Date/Time   COLORURINE YELLOW 06/15/2020 1516   APPEARANCEUR CLEAR 06/15/2020 1516   LABSPEC 1.025 06/15/2020 1516   PHURINE 5.5 06/15/2020 1516   GLUCOSEU >=500 (A) 06/15/2020 1516   HGBUR MODERATE (A) 06/15/2020 1516   BILIRUBINUR NEGATIVE 06/15/2020 1516   KETONESUR NEGATIVE 06/15/2020 1516   PROTEINUR 100 (A) 06/15/2020 1516   NITRITE NEGATIVE 06/15/2020 1516   LEUKOCYTESUR NEGATIVE 06/15/2020 1516   Sepsis Labs: Invalid input(s): PROCALCITONIN, LACTICIDVEN   Time coordinating discharge: 35 minutes  SIGNED:  Mercy Riding, MD  [facility] 10/08/2020, 11:41 AM  If 7PM-7AM, please contact night-coverage [email or web address]

## 2020-10-08 NOTE — TOC Progression Note (Signed)
Transition of Care [facility]) - Progression Note    Patient Details  Name: Travis [name] MRN: [id number] Date of Birth: [birth year]  Transition of Care [facility]) CM/SW Contact  Floetta Brickey, Marta Lamas,  Phone Number: 10/08/2020, 2:13 PM  Clinical Narrative:     Inpatient status never changed to observation status; therefore, Code 44 not given to patient.  Paper form not given to patient and billing status was not changed.        Expected Discharge Plan and Services           Expected Discharge Date: 10/08/20                                     Social Determinants of Health (SDOH) Interventions    Readmission Risk Interventions No flowsheet data found.  Nat Christen, BSW, MSW, LCSW  Licensed Education officer, [facility]  Mailing [place] [address], [employer] [place], [place] [address] Physical Address[address], [place], [place] [address] Toll Free Main # [phone] Fax # [phone] Cell # [phone]  [email or web address]

## 2020-11-13 ENCOUNTER — Encounter: Payer: Self-pay | Admitting: Hematology

## 2020-11-13 ENCOUNTER — Emergency Department (HOSPITAL_BASED_OUTPATIENT_CLINIC_OR_DEPARTMENT_OTHER): Payer: Medicare HMO

## 2020-11-13 ENCOUNTER — Other Ambulatory Visit: Payer: Self-pay

## 2020-11-13 ENCOUNTER — Emergency Department (HOSPITAL_BASED_OUTPATIENT_CLINIC_OR_DEPARTMENT_OTHER)
Admission: EM | Admit: 2020-11-13 | Discharge: 2020-11-13 | Disposition: A | Discharge status: Home / Self Care | Payer: Medicare HMO | Attending: Emergency Medicine | Admitting: Emergency Medicine

## 2020-11-13 ENCOUNTER — Encounter (HOSPITAL_BASED_OUTPATIENT_CLINIC_OR_DEPARTMENT_OTHER): Payer: Self-pay | Admitting: Radiology

## 2020-11-13 DIAGNOSIS — Z7984 Long term (current) use of oral hypoglycemic drugs: Secondary | ICD-10-CM | POA: Insufficient documentation

## 2020-11-13 DIAGNOSIS — Z20822 Contact with and (suspected) exposure to covid-19: Secondary | ICD-10-CM | POA: Insufficient documentation

## 2020-11-13 DIAGNOSIS — R112 Nausea with vomiting, unspecified: Secondary | ICD-10-CM

## 2020-11-13 DIAGNOSIS — E1122 Type 2 diabetes mellitus with diabetic chronic kidney disease: Secondary | ICD-10-CM | POA: Insufficient documentation

## 2020-11-13 DIAGNOSIS — Z79899 Other long term (current) drug therapy: Secondary | ICD-10-CM | POA: Diagnosis not present

## 2020-11-13 DIAGNOSIS — I129 Hypertensive chronic kidney disease with stage 1 through stage 4 chronic kidney disease, or unspecified chronic kidney disease: Secondary | ICD-10-CM | POA: Diagnosis not present

## 2020-11-13 DIAGNOSIS — E875 Hyperkalemia: Secondary | ICD-10-CM | POA: Diagnosis not present

## 2020-11-13 DIAGNOSIS — Z794 Long term (current) use of insulin: Secondary | ICD-10-CM | POA: Insufficient documentation

## 2020-11-13 DIAGNOSIS — Z7951 Long term (current) use of inhaled steroids: Secondary | ICD-10-CM | POA: Diagnosis not present

## 2020-11-13 DIAGNOSIS — E1140 Type 2 diabetes mellitus with diabetic neuropathy, unspecified: Secondary | ICD-10-CM | POA: Diagnosis not present

## 2020-11-13 DIAGNOSIS — Z87891 Personal history of nicotine dependence: Secondary | ICD-10-CM | POA: Insufficient documentation

## 2020-11-13 DIAGNOSIS — R42 Dizziness and giddiness: Secondary | ICD-10-CM | POA: Insufficient documentation

## 2020-11-13 DIAGNOSIS — N183 Chronic kidney disease, stage 3 unspecified: Secondary | ICD-10-CM | POA: Diagnosis not present

## 2020-11-13 DIAGNOSIS — R1084 Generalized abdominal pain: Secondary | ICD-10-CM | POA: Diagnosis present

## 2020-11-13 DIAGNOSIS — Z85118 Personal history of other malignant neoplasm of bronchus and lung: Secondary | ICD-10-CM | POA: Diagnosis not present

## 2020-11-13 DIAGNOSIS — J441 Chronic obstructive pulmonary disease with (acute) exacerbation: Secondary | ICD-10-CM | POA: Diagnosis not present

## 2020-11-13 LAB — CBC WITH DIFFERENTIAL/PLATELET
Abs Immature Granulocytes: 0.06 10*3/uL (ref 0.00–0.07)
Basophils Absolute: 0 10*3/uL (ref 0.0–0.1)
Basophils Relative: 0 %
Eosinophils Absolute: 0.1 10*3/uL (ref 0.0–0.5)
Eosinophils Relative: 1 %
HCT: 41.5 % (ref 39.0–52.0)
Hemoglobin: 13.7 g/dL (ref 13.0–17.0)
Immature Granulocytes: 0 %
Lymphocytes Relative: 5 %
Lymphs Abs: 0.8 10*3/uL (ref 0.7–4.0)
MCH: 29.5 pg (ref 26.0–34.0)
MCHC: 33 g/dL (ref 30.0–36.0)
MCV: 89.4 fL (ref 80.0–100.0)
Monocytes Absolute: 0.8 10*3/uL (ref 0.1–1.0)
Monocytes Relative: 5 %
Neutro Abs: 13.3 10*3/uL — ABNORMAL HIGH (ref 1.7–7.7)
Neutrophils Relative %: 89 %
Platelets: 258 10*3/uL (ref 150–400)
RBC: 4.64 MIL/uL (ref 4.22–5.81)
RDW: 13.6 % (ref 11.5–15.5)
WBC: 15 10*3/uL — ABNORMAL HIGH (ref 4.0–10.5)
nRBC: 0 % (ref 0.0–0.2)

## 2020-11-13 LAB — LIPASE, BLOOD: Lipase: 28 U/L (ref 11–51)

## 2020-11-13 LAB — BASIC METABOLIC PANEL
Anion gap: 8 (ref 5–15)
BUN: 22 mg/dL (ref 8–23)
CO2: 22 mmol/L (ref 22–32)
Calcium: 9 mg/dL (ref 8.9–10.3)
Chloride: 107 mmol/L (ref 98–111)
Creatinine, Ser: 1.76 mg/dL — ABNORMAL HIGH (ref 0.61–1.24)
GFR, Estimated: 43 mL/min — ABNORMAL LOW (ref >60)
Glucose, Bld: 141 mg/dL — ABNORMAL HIGH (ref 70–99)
Potassium: 5.1 mmol/L (ref 3.5–5.1)
Sodium: 137 mmol/L (ref 135–145)

## 2020-11-13 LAB — RESP PANEL BY RT-PCR (FLU A&B, COVID) ARPGX2
Influenza A by PCR: NEGATIVE
Influenza B by PCR: NEGATIVE
SARS Coronavirus 2 by RT PCR: NEGATIVE

## 2020-11-13 LAB — COMPREHENSIVE METABOLIC PANEL
ALT: 21 U/L (ref 0–44)
AST: 18 U/L (ref 15–41)
Albumin: 4.1 g/dL (ref 3.5–5.0)
Alkaline Phosphatase: 93 U/L (ref 38–126)
Anion gap: 8 (ref 5–15)
BUN: 22 mg/dL (ref 8–23)
CO2: 23 mmol/L (ref 22–32)
Calcium: 9.4 mg/dL (ref 8.9–10.3)
Chloride: 104 mmol/L (ref 98–111)
Creatinine, Ser: 1.94 mg/dL — ABNORMAL HIGH (ref 0.61–1.24)
GFR, Estimated: 38 mL/min — ABNORMAL LOW (ref >60)
Glucose, Bld: 297 mg/dL — ABNORMAL HIGH (ref 70–99)
Potassium: 6 mmol/L — ABNORMAL HIGH (ref 3.5–5.1)
Sodium: 135 mmol/L (ref 135–145)
Total Bilirubin: 0.4 mg/dL (ref 0.3–1.2)
Total Protein: 7.9 g/dL (ref 6.5–8.1)

## 2020-11-13 LAB — CBG MONITORING, ED
Glucose-Capillary: 294 mg/dL — ABNORMAL HIGH (ref 70–99)
Glucose-Capillary: 115 mg/dL — ABNORMAL HIGH (ref 70–99)

## 2020-11-13 LAB — URINALYSIS, ROUTINE W REFLEX MICROSCOPIC
Bilirubin Urine: NEGATIVE
Glucose, UA: NEGATIVE mg/dL
Hgb urine dipstick: NEGATIVE
Ketones, ur: NEGATIVE mg/dL
Leukocytes,Ua: NEGATIVE
Nitrite: NEGATIVE
Protein, ur: 100 mg/dL — AB
Specific Gravity, Urine: 1.015 (ref 1.005–1.030)
pH: 5 (ref 5.0–8.0)

## 2020-11-13 LAB — URINALYSIS, MICROSCOPIC (REFLEX): WBC, UA: NONE SEEN WBC/hpf (ref 0–5)

## 2020-11-13 MED ORDER — ACETAMINOPHEN 325 MG PO TABS
650.0000 mg | ORAL_TABLET | Freq: Once | ORAL | Status: AC
  Administered 2020-11-13: 650 mg via ORAL
  Filled 2020-11-13: qty 2

## 2020-11-13 MED ORDER — ONDANSETRON HCL 4 MG/2ML IJ SOLN
4.0000 mg | Freq: Once | INTRAMUSCULAR | Status: AC
  Administered 2020-11-13: 4 mg via INTRAVENOUS
  Filled 2020-11-13: qty 2

## 2020-11-13 MED ORDER — INSULIN ASPART 100 UNIT/ML IV SOLN
5.0000 [IU] | Freq: Once | INTRAVENOUS | Status: AC
  Administered 2020-11-13: 5 [IU] via INTRAVENOUS

## 2020-11-13 MED ORDER — LOKELMA 10 G PO PACK: 10.0000 g | PACK | Freq: Two times a day (BID) | ORAL | 0 refills | Status: AC

## 2020-11-13 MED ORDER — SODIUM CHLORIDE 0.9 % IV BOLUS
500.0000 mL | Freq: Once | INTRAVENOUS | Status: AC
  Administered 2020-11-13: 500 mL via INTRAVENOUS

## 2020-11-13 MED ORDER — SODIUM CHLORIDE 0.9 % IV BOLUS
1000.0000 mL | Freq: Once | INTRAVENOUS | Status: AC
  Administered 2020-11-13: 1000 mL via INTRAVENOUS

## 2020-11-13 MED ORDER — PROCHLORPERAZINE EDISYLATE 10 MG/2ML IJ SOLN
10.0000 mg | Freq: Once | INTRAMUSCULAR | Status: AC
  Administered 2020-11-13: 10 mg via INTRAVENOUS
  Filled 2020-11-13: qty 2

## 2020-11-13 MED ORDER — IOHEXOL 300 MG/ML  SOLN
75.0000 mL | Freq: Once | INTRAMUSCULAR | Status: AC | PRN
  Administered 2020-11-13: 75 mL via INTRAVENOUS

## 2020-11-13 MED ORDER — DIPHENHYDRAMINE HCL 50 MG/ML IJ SOLN
12.5000 mg | Freq: Once | INTRAMUSCULAR | Status: AC
  Administered 2020-11-13: 12.5 mg via INTRAVENOUS
  Filled 2020-11-13: qty 1

## 2020-11-13 MED ORDER — SODIUM ZIRCONIUM CYCLOSILICATE 10 G PO PACK
10.0000 g | PACK | Freq: Once | ORAL | Status: AC
  Administered 2020-11-13: 10 g via ORAL
  Filled 2020-11-13: qty 1

## 2020-11-13 NOTE — Discharge Instructions (Addendum)
Take the medication Lokelma as directed to help bring down your potassium.  Make an appointment to follow-up with your primary care doctor to have potassium rechecked.

## 2020-11-13 NOTE — ED Provider Notes (Signed)
Patient turned over to check potassium.  Improved significantly with the Lokelma.  Is now 5.1.  In the fluids.  Patient stable based on game plan after discussion with hospitalist for discharge home and follow-up with primary care doctor.  We will have patient take the [facility] twice a day for the next 3 days.   Fredia Sorrow, MD 11/13/20 [phone]

## 2020-11-13 NOTE — ED Triage Notes (Signed)
Pt states started having diarrhea and LUQ pain this morning with headache. C/o dizziness and nausea.

## 2020-11-13 NOTE — ED Provider Notes (Signed)
[name] Provider Note   CSN: [id number] Arrival date & time: 11/13/20  1120     History Chief Complaint  Patient presents with   Abdominal Pain    Travis [name] is a 65 y.o. male.  The history is provided by the patient.  Abdominal Pain Pain location:  Epigastric and LUQ Pain quality: aching and bloating   Pain radiates to:  Does not radiate Pain severity:  Mild Onset quality:  Gradual Duration:  6 hours Timing:  Intermittent Progression:  Waxing and waning Chronicity:  New Context: not sick contacts and not suspicious food intake   Relieved by:  Nothing Worsened by:  Nothing Associated symptoms: diarrhea, nausea and vomiting   Associated symptoms: no chest pain, no chills, no cough, no dysuria, no fatigue, no fever, no hematemesis, no hematochezia, no hematuria, no shortness of breath and no sore throat   Risk factors: has not had multiple surgeries   Risk factors comment:  CKD, DM     Past Medical History:  Diagnosis Date   Arthritis    Cancer ([place])    lung   Chronic kidney disease    Diabetes mellitus    TYPE II   Dyspnea    at times   Hypertension    Pneumonia    "years ago"    Patient Active Problem List   Diagnosis Date Noted   COPD exacerbation ([place]) 10/07/2020   CKD (chronic kidney disease) stage 3, GFR 30-59 ml/min ([place]) 10/07/2020   DM (diabetes mellitus), type 2 with renal complications ([place]) 99/83/3825   Acute respiratory failure with hypoxia ([place]) 03/31/2019   Hyperglycemia 03/21/2019   AKI (acute kidney injury) ([place]) 03/21/2019   ARF (acute renal failure) ([place]) 03/20/2019   Exertional dyspnea 09/04/2018   Leukopenia due to antineoplastic chemotherapy ([place]) 06/05/2018   Diabetes ([place]) 05/15/2018   Diabetic neuropathy ([place]) 05/08/2018   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 [facility]) 04/30/2018   COPD (chronic obstructive pulmonary disease) ([place]) 04/24/2018    Right lower lobe lung mass    Anemia due to antineoplastic chemotherapy 04/06/2018   Lung cancer ([place]) 04/03/2018    Past Surgical History:  Procedure Laterality Date   COLONOSCOPY W/ POLYPECTOMY     IR IMAGING GUIDED PORT INSERTION  05/05/2018   NASAL FRACTURE SURGERY     VIDEO BRONCHOSCOPY WITH ENDOBRONCHIAL ULTRASOUND Right 04/20/2018   Procedure: VIDEO BRONCHOSCOPY WITH ENDOBRONCHIAL ULTRASOUND;  Surgeon: Juanito Doom, MD;  Location: [facility] OR;  Service: Thoracic;  Laterality: Right;       Family History  Problem Relation Age of Onset   Leukemia Sister    Cancer Brother    Diabetes Mother     Social History   Tobacco Use   Smoking status: Former    Packs/day: 1.00    Years: 45.00    Pack years: 45.00    Types: Cigarettes    Quit date: 03/31/2018    Years since quitting: 2.6   Smokeless tobacco: Former    Types: Chew    Quit date: 03/25/1973  Vaping Use   Vaping Use: Never used  Substance Use Topics   Alcohol use: Not Currently   Drug use: No    Home Medications Prior to Admission medications   Medication Sig Start Date End Date Taking? Authorizing Provider  albuterol (VENTOLIN HFA) 108 (90 Base) MCG/ACT inhaler Inhale 2 puffs into the lungs every 6 (six) hours as needed for wheezing or  shortness of breath. 07/09/18   Tish Men, MD  amitriptyline (ELAVIL) 50 MG tablet Take 50 mg by mouth at bedtime.    [provider]  amLODipine (NORVASC) 5 MG tablet Take 5 mg by mouth daily. 04/10/18   [provider]  budesonide-formoterol (SYMBICORT) 160-4.5 MCG/ACT inhaler Inhale 2 puffs into the lungs 2 (two) times daily. 07/09/18   Tish Men, MD  DULoxetine (CYMBALTA) 30 MG capsule Take 30 mg by mouth daily. 09/13/19   [provider]  empagliflozin (JARDIANCE) 25 MG TABS tablet Take 1 tablet (25 mg total) by mouth daily. 10/16/20   Mercy Riding, MD  famotidine (PEPCID) 20 MG tablet Take 20 mg by mouth daily. 03/11/19   [provider]   gabapentin (NEURONTIN) 300 MG capsule Take 2 capsules (600 mg total) by mouth 2 (two) times daily. 10/08/20   Mercy Riding, MD  glimepiride (AMARYL) 2 MG tablet Take 2 mg by mouth daily with breakfast.  08/01/18   [provider]  glucose blood test strip 4x a day 07/01/18   Shamleffer, Melanie Crazier, MD  insulin aspart (NOVOLOG) 100 UNIT/ML FlexPen Inject 6-16 Units into the skin See admin instructions. BS 180-200 add extra 2 units BS 201-250 add 5 units BS 251-300 add 7 units 301-350 add 10 units 351-400 add 12 units  bs >400 call PCP 03/25/19   [provider]  insulin glargine (LANTUS) 100 UNIT/ML Solostar Pen Inject 35 Units into the skin 2 (two) times daily for 3 days, THEN 35 Units daily. 10/08/20 11/10/20  Mercy Riding, MD  Insulin Pen Needle (BD PEN NEEDLE NANO U/F) 32G X 4 MM MISC 4x daily 07/01/18   Shamleffer, Melanie Crazier, MD  ketoconazole (NIZORAL) 2 % cream Apply 1 application topically 2 (two) times daily as needed for irritation. 09/28/19   [provider]  lidocaine (LIDODERM) 5 % Place 1 patch onto the skin daily. Remove & Discard patch within 12 hours or as directed by MD 06/12/19   Eustaquio Maize, PA-C  lidocaine (XYLOCAINE) 2 % solution Patient: Mix 1part 2% viscous lidocaine, 1part H20. Swish & swallow 20mL of diluted mixture, 43min before meals and at bedtime, up to QID Patient taking differently: Use as directed 15 mLs in the mouth or throat 4 (four) times daily as needed for mouth pain. Patient: Mix 1part 2% viscous lidocaine, 1part H20. Swish & swallow 43mL of diluted mixture, 4min before meals and at bedtime, up to QID 05/29/18   Tish Men, MD  losartan (COZAAR) 25 MG tablet Take 1 tablet (25 mg total) by mouth daily. 10/16/20   Mercy Riding, MD  magnesium oxide (MAG-OX) 400 (241.3 Mg) MG tablet Take 1 tablet by mouth 2 (two) times daily. 03/15/19   [provider]  morphine (MSIR) 15 MG tablet Take 15 mg by mouth every 8 (eight) hours as  needed for pain. Patient not taking: Reported on 10/07/2020 01/22/19   [provider]  ondansetron (ZOFRAN ODT) 4 MG disintegrating tablet Take 1 tablet (4 mg total) by mouth every 8 (eight) hours as needed for nausea or vomiting. Patient not taking: No sig reported 03/02/20   Lucrezia Starch, MD  pantoprazole (PROTONIX) 40 MG tablet Take 40 mg by mouth daily. 03/05/19   [provider]  polyethylene glycol (MIRALAX / GLYCOLAX) 17 g packet Take 17 g by mouth daily. Patient not taking: No sig reported 04/03/19   Alma Friendly, MD  potassium chloride (KLOR-CON) 10 MEQ  tablet Take 1 tablet (10 mEq total) by mouth daily. 10/16/20   Mercy Riding, MD  rosuvastatin (CRESTOR) 10 MG tablet Take 10 mg by mouth daily. 09/03/19   [provider]  torsemide (DEMADEX) 20 MG tablet Take 1 tablet (20 mg total) by mouth 2 (two) times daily. 10/16/20   Mercy Riding, MD  traMADol (ULTRAM) 50 MG tablet Take 1 tablet (50 mg total) by mouth every 8 (eight) hours as needed for moderate pain. 09/04/18   Tish Men, MD  TRELEGY ELLIPTA 100-62.5-25 MCG/INH AEPB Inhale 1 puff into the lungs daily. 02/26/19   [provider]  Vitamin D, Ergocalciferol, (DRISDOL) 1.25 MG (50000 UNIT) CAPS capsule Take 50,000 Units by mouth once a week. 08/14/20   [provider]  prochlorperazine (COMPAZINE) 10 MG tablet Take 1 tablet (10 mg total) by mouth every 6 (six) hours as needed (Nausea or vomiting). 04/24/18 08/04/18  Tish Men, MD    Allergies    Patient has no known allergies.  Review of Systems   Review of Systems  Constitutional:  Negative for chills, fatigue and fever.  HENT:  Negative for ear pain and sore throat.   Eyes:  Negative for pain and visual disturbance.  Respiratory:  Negative for cough and shortness of breath.   Cardiovascular:  Negative for chest pain and palpitations.  Gastrointestinal:  Positive for abdominal pain, diarrhea, nausea and vomiting. Negative for  hematemesis and hematochezia.  Genitourinary:  Negative for dysuria and hematuria.  Musculoskeletal:  Negative for arthralgias and back pain.  Skin:  Negative for color change and rash.  Neurological:  Negative for seizures and syncope.  All other systems reviewed and are negative.  Physical Exam Updated Vital Signs BP (!) 149/90 (BP Location: Right Arm)   Pulse (!) 103   Temp 98.3 F (36.8 C) (Oral)   Resp 18   Ht 5\' 11"  (1.803 m)   Wt 93 kg   SpO2 97%   BMI 28.59 kg/m   Physical Exam Vitals and nursing note reviewed.  Constitutional:      General: He is not in acute distress.    Appearance: He is well-developed. He is not ill-appearing.  HENT:     Head: Normocephalic and atraumatic.     Mouth/Throat:     Mouth: Mucous membranes are moist.  Eyes:     Extraocular Movements: Extraocular movements intact.     Conjunctiva/sclera: Conjunctivae normal.     Pupils: Pupils are equal, round, and reactive to light.  Cardiovascular:     Rate and Rhythm: Normal rate and regular rhythm.     Heart sounds: Normal heart sounds. No murmur heard. Pulmonary:     Effort: Pulmonary effort is normal. No respiratory distress.     Breath sounds: Normal breath sounds.  Abdominal:     General: Abdomen is flat. There is distension.     Palpations: Abdomen is soft.     Tenderness: There is abdominal tenderness in the epigastric area and left upper quadrant. There is no guarding or rebound.  Musculoskeletal:     Cervical back: Neck supple.  Skin:    General: Skin is warm and dry.     Capillary Refill: Capillary refill takes less than 2 seconds.  Neurological:     General: No focal deficit present.     Mental Status: He is alert.  Psychiatric:        Mood and Affect: Mood normal.    ED Results / Procedures / Treatments  Labs (all labs ordered are listed, but only abnormal results are displayed) Labs Reviewed  CBC WITH DIFFERENTIAL/PLATELET - Abnormal; Notable for the following  components:      Result Value   WBC 15.0 (*)    Neutro Abs 13.3 (*)    All other components within normal limits  COMPREHENSIVE METABOLIC PANEL - Abnormal; Notable for the following components:   Potassium 6.0 (*)    Glucose, Bld 297 (*)    Creatinine, Ser 1.94 (*)    GFR, Estimated 38 (*)    All other components within normal limits  CBG MONITORING, ED - Abnormal; Notable for the following components:   Glucose-Capillary 294 (*)    All other components within normal limits  RESP PANEL BY RT-PCR (FLU A&B, COVID) ARPGX2  LIPASE, BLOOD  URINALYSIS, ROUTINE W REFLEX MICROSCOPIC  BASIC METABOLIC PANEL    EKG EKG Interpretation  Date/Time:  Monday November 13 2020 11:41:38 EDT Ventricular Rate:  112 PR Interval:  162 QRS Duration: 74 QT Interval:  304 QTC Calculation: 414 R Axis:   67 Text Interpretation: Sinus tachycardia Otherwise normal ECG No significant change Confirmed by Lennice Sites ([id number]) on 11/13/2020 11:44:51 AM  Radiology CT ABDOMEN PELVIS W CONTRAST  Result Date: 11/13/2020 CLINICAL DATA:  Diarrhea.  Left upper quadrant pain. EXAM: CT ABDOMEN AND PELVIS WITH CONTRAST TECHNIQUE: Multidetector CT imaging of the abdomen and pelvis was performed using the standard protocol following bolus administration of intravenous contrast. CONTRAST:  68mL OMNIPAQUE IOHEXOL 300 MG/ML  SOLN COMPARISON:  06/15/2020 FINDINGS: Lower chest: Small pulmonary nodules are again noted within the visualized portions of the lung bases. The largest is in the periphery of the right base measuring 4 mm, image 7/4. No acute abnormality. Hepatobiliary: No suspicious liver abnormality identified. Collapsed gallbladder. No signs of inflammation. No bile duct dilatation. Pancreas: Unremarkable. No pancreatic ductal dilatation or surrounding inflammatory changes. Spleen: Normal in size without focal abnormality. Adrenals/Urinary Tract: Normal adrenal glands. No kidney stone, mass or hydronephrosis identified.  Urinary bladder is unremarkable. Stomach/Bowel: Stomach is within normal limits. Appendix appears normal. No evidence of bowel wall thickening, distention, or inflammatory changes. Sigmoid diverticula identified without signs of acute inflammation. Vascular/Lymphatic: Aortic atherosclerosis. No abdominopelvic adenopathy. Reproductive: Prostate is unremarkable.  Right hydrocele. Other: No free fluid or fluid collections. Musculoskeletal: No acute or significant osseous findings. IMPRESSION: 1. No acute findings within the abdomen or pelvis. 2. Small lung nodules are identified within the lung bases. These are stable compared with 10/09/2019 compatible with a benign process. No further follow-up of these nodules recommended. 3.  Aortic Atherosclerosis (ICD10-I70.0). Electronically Signed   By: Kerby Moors M.D.   On: 11/13/2020 14:02    Procedures .Critical Care  Date/Time: 11/13/2020 2:42 PM Performed by: Lennice Sites, DO Authorized by: Lennice Sites, DO   Critical care provider statement:    Critical care time (minutes):  45   Critical care was necessary to treat or prevent imminent or life-threatening deterioration of the following conditions: hyperkalemia.   Critical care was time spent personally by me on the following activities:  Blood draw for specimens, development of treatment plan with patient or surrogate, discussions with primary provider, evaluation of patient's response to treatment, examination of patient, obtaining history from patient or surrogate, ordering and review of laboratory studies, ordering and performing treatments and interventions, ordering and review of radiographic studies, pulse oximetry, re-evaluation of patient's condition and review of old charts   I assumed direction of critical care for this  patient from another provider in my specialty: no     Care discussed with: admitting provider     Medications Ordered in ED Medications  ondansetron (ZOFRAN) injection 4  mg (4 mg Intravenous Given 11/13/20 1232)  sodium chloride 0.9 % bolus 1,000 mL (0 mLs Intravenous Stopped 11/13/20 1409)  acetaminophen (TYLENOL) tablet 650 mg (650 mg Oral Given 11/13/20 1244)  sodium zirconium cyclosilicate (LOKELMA) packet 10 g (10 g Oral Given 11/13/20 1340)  insulin aspart (novoLOG) injection 5 Units (5 Units Intravenous Given 11/13/20 1345)  iohexol (OMNIPAQUE) 300 MG/ML solution 75 mL (75 mLs Intravenous Contrast Given 11/13/20 1314)  prochlorperazine (COMPAZINE) injection 10 mg (10 mg Intravenous Given 11/13/20 1407)  diphenhydrAMINE (BENADRYL) injection 12.5 mg (12.5 mg Intravenous Given 11/13/20 1406)    ED Course  I have reviewed the triage vital signs and the nursing notes.  Pertinent labs & imaging results that were available during my care of the patient were reviewed by me and considered in my medical decision making (see chart for details).    MDM Rules/Calculators/A&P                           [name] is here with abdominal pain, nausea, vomiting, diarrhea.  Patient with history of diabetes, chronic kidney disease, hypertension.  Woke up this morning feeling okay but when he got to work he nauseous and has some diarrhea and developed upper abdominal pain.  No chest pain, no cough.  Feels a little dizzy and lightheaded with some mild headache afterwards.  Neurologically he is intact.  He is abdominal pain mostly in his upper abdomen but diffusely.  He appears distended.  Denies any suspicious food intake.  No alcohol use.  No melena.  Denies fever or chills.  Vital signs overall unremarkable.  No fever here.  Sounds like possibly a viral process but could be bowel obstruction, pancreatitis, cholecystitis.  EKG shows sinus rhythm.  No ischemic changes.  Has slightly peaked T waves but unchanged from prior.  Will give fluid bolus, IV Zofran.  We will get basic labs and CT scan abdomen and pelvis to further evaluate.  Potassium came back at 6.  Will provide  Lokelma, insulin.  As stated previously does not appear to have acute hyperkalemic changes on EKG.  He does have a count of 15.  Blood sugar is 297.  Creatinine 1.94.  Lipase unremarkable.  Gallbladder liver enzymes within normal limits.  Will get COVID test and awaiting CT scan abdomen and pelvis.    CT abdomen pelvis scan shows no acute findings.  Will get head CT given that he has headache.  Will give headache cocktail with Benadryl and Compazine.  Talk with medicine about admission given potassium was 6 but since work-up was overall unremarkable.  Nausea is improving and headache is improving will recheck BMP and see if there is improvement of his potassium.  If there is they recommend Lokelma for 3 days follow-up with primary care doctor for repeat BMP to later this week.  Overall suspect abdominal discomfort nausea and vomiting likely from viral process or may be some diabetic gastroparesis.,  discussed with patient pending reevaluation and recheck of bmp. Please see their note for further results, eval, dispo.  This chart was dictated using voice recognition software.  Despite best efforts to proofread,  errors can occur which can change the documentation meaning.   Final Clinical Impression(s) / ED Diagnoses Final diagnoses:  Hyperkalemia  Nausea and vomiting, intractability of vomiting not specified, unspecified vomiting type  Generalized abdominal pain    Rx / DC Orders ED Discharge Orders     None        Lennice Sites, DO 11/13/20 1443

## 2020-11-18 IMAGING — CT NM PET TUM IMG INITIAL (PI) SKULL BASE T - THIGH
1 of 8 series · 1 of 25 positions shown · non-contrast
Comparison: CT chest abdomen pelvis dated 03/31/2018

CLINICAL DATA: Initial treatment strategy for right lung mass.

EXAM:
NUCLEAR MEDICINE PET SKULL BASE TO THIGH
TECHNIQUE: 8.0 mCi F-18 FDG was injected intravenously. Full-ring PET imaging
was performed from the skull base to thigh after the radiotracer. CT
data was obtained and used for attenuation correction and anatomic
localization.
Fasting blood glucose: 133 mg/dl

[Series 4: ct sk_thigh 5.0 b31f · axial · 5.0mm · 0.98mm/px · 1 of 240 slices shown]
[im 240/240  brain]
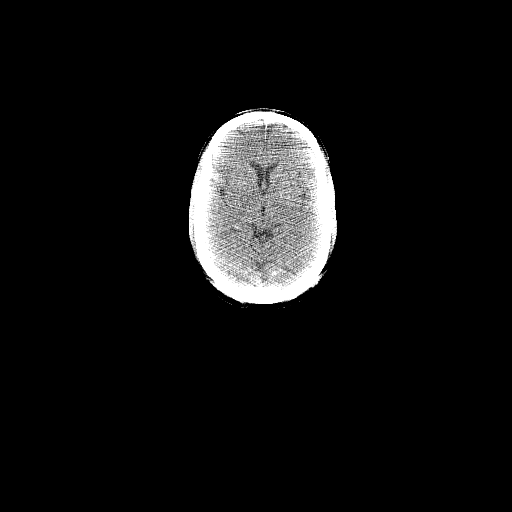

[1 of 25 positions shown; findings below may reference images not displayed]

FINDINGS: Mediastinal blood pool activity: SUV max

NECK: No hypermetabolic cervical lymphadenopathy.

Incidental CT findings: none

CHEST: [DATE] x 3.6 cm spiculated mass in the superior segment right
lower lobe (series 8/image 29), max SUV 10.4. Associated satellite
nodularity in the right lower lobe measuring up to 8 mm, suspicious
for metastases.

7 mm short axis low right paratracheal node (series 4/image 31), max
SUV 2.6, likely reactive. Additional mild hypermetabolism in the
right hilar region, max SUV 2.1, likely reactive.

Incidental CT findings: Atherosclerotic calcifications of the aortic
arch.

ABDOMEN/PELVIS: No abnormal hypermetabolism in the liver, spleen,
pancreas, or adrenal glands.

No hypermetabolic abdominopelvic lymphadenopathy.

Incidental CT findings: Atherosclerotic calcifications the abdominal
aorta and branch vessels.

SKELETON: No focal hypermetabolic activity to suggest skeletal
metastasis.

Incidental CT findings: Degenerative changes of the visualized
thoracolumbar spine.
IMPRESSION: 5.3 cm spiculated right lower lobe mass, compatible with primary
bronchogenic neoplasm.

Associated satellite nodularity in the right lower lobe measuring up
to 8 mm, suspicious for metastases.

## 2021-07-24 ENCOUNTER — Other Ambulatory Visit: Payer: Self-pay | Admitting: Pharmacist

## 2021-09-04 ENCOUNTER — Emergency Department (HOSPITAL_BASED_OUTPATIENT_CLINIC_OR_DEPARTMENT_OTHER): Payer: Medicare HMO

## 2021-09-04 ENCOUNTER — Encounter (HOSPITAL_BASED_OUTPATIENT_CLINIC_OR_DEPARTMENT_OTHER): Payer: Self-pay

## 2021-09-04 ENCOUNTER — Emergency Department (HOSPITAL_BASED_OUTPATIENT_CLINIC_OR_DEPARTMENT_OTHER)
Admission: EM | Admit: 2021-09-04 | Discharge: 2021-09-04 | Disposition: A | Discharge status: Home / Self Care | Payer: Medicare HMO | Attending: Emergency Medicine | Admitting: Emergency Medicine

## 2021-09-04 ENCOUNTER — Other Ambulatory Visit: Payer: Self-pay

## 2021-09-04 DIAGNOSIS — Z79899 Other long term (current) drug therapy: Secondary | ICD-10-CM | POA: Insufficient documentation

## 2021-09-04 DIAGNOSIS — S0990XA Unspecified injury of head, initial encounter: Secondary | ICD-10-CM | POA: Insufficient documentation

## 2021-09-04 DIAGNOSIS — N189 Chronic kidney disease, unspecified: Secondary | ICD-10-CM | POA: Diagnosis not present

## 2021-09-04 DIAGNOSIS — R079 Chest pain, unspecified: Secondary | ICD-10-CM | POA: Diagnosis not present

## 2021-09-04 DIAGNOSIS — I129 Hypertensive chronic kidney disease with stage 1 through stage 4 chronic kidney disease, or unspecified chronic kidney disease: Secondary | ICD-10-CM | POA: Insufficient documentation

## 2021-09-04 DIAGNOSIS — Z794 Long term (current) use of insulin: Secondary | ICD-10-CM | POA: Insufficient documentation

## 2021-09-04 DIAGNOSIS — Y92410 Unspecified street and highway as the place of occurrence of the external cause: Secondary | ICD-10-CM | POA: Insufficient documentation

## 2021-09-04 DIAGNOSIS — E1122 Type 2 diabetes mellitus with diabetic chronic kidney disease: Secondary | ICD-10-CM | POA: Diagnosis not present

## 2021-09-04 DIAGNOSIS — M542 Cervicalgia: Secondary | ICD-10-CM | POA: Insufficient documentation

## 2021-09-04 MED ORDER — TIZANIDINE HCL 2 MG PO CAPS: 2.0000 mg | ORAL_CAPSULE | Freq: Three times a day (TID) | ORAL | 0 refills | Status: DC | PRN
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
## 2021-09-07 ENCOUNTER — Encounter (HOSPITAL_BASED_OUTPATIENT_CLINIC_OR_DEPARTMENT_OTHER): Payer: Self-pay

## 2021-09-07 ENCOUNTER — Emergency Department (HOSPITAL_BASED_OUTPATIENT_CLINIC_OR_DEPARTMENT_OTHER): Payer: Medicare HMO

## 2021-09-07 ENCOUNTER — Emergency Department (HOSPITAL_BASED_OUTPATIENT_CLINIC_OR_DEPARTMENT_OTHER)
Admission: EM | Admit: 2021-09-07 | Discharge: 2021-09-07 | Disposition: A | Discharge status: Home / Self Care | Payer: Medicare HMO | Attending: Emergency Medicine | Admitting: Emergency Medicine

## 2021-09-07 DIAGNOSIS — J449 Chronic obstructive pulmonary disease, unspecified: Secondary | ICD-10-CM | POA: Diagnosis not present

## 2021-09-07 DIAGNOSIS — Z85118 Personal history of other malignant neoplasm of bronchus and lung: Secondary | ICD-10-CM | POA: Insufficient documentation

## 2021-09-07 DIAGNOSIS — Z794 Long term (current) use of insulin: Secondary | ICD-10-CM | POA: Diagnosis not present

## 2021-09-07 DIAGNOSIS — Z7951 Long term (current) use of inhaled steroids: Secondary | ICD-10-CM | POA: Insufficient documentation

## 2021-09-07 DIAGNOSIS — M5440 Lumbago with sciatica, unspecified side: Secondary | ICD-10-CM

## 2021-09-07 DIAGNOSIS — M545 Low back pain, unspecified: Secondary | ICD-10-CM | POA: Diagnosis present

## 2021-09-07 MED ORDER — LIDOCAINE 5 % EX PTCH: 1.0000 | MEDICATED_PATCH | CUTANEOUS | 0 refills | Status: DC
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
## 2021-09-07 NOTE — Discharge Instructions (Addendum)
You were seen in the emergency department for some left-sided low back pain radiating down your left leg.  You had a CAT scan of your lumbar spine and your pelvis and left hip that did not show any acute fracture.  You do have signs of a bulging disc in the lumbar spine and this may be a cause of your symptoms.  You also had some arthritis findings in the left hip.  Please continue anti-inflammatories such as Aleve or ibuprofen.  Lidoderm patch locally to your back.  Continue the muscle relaxant as needed.  Contact your primary care doctor for close follow-up.  You may end up needing an MRI of your back in the future.  A copy of the CAT scan report is included below.  IMPRESSION:  1. No evidence of acute fracture or traumatic malalignment.  2. Multilevel degenerative change including disc bulging at L4-L5  with at least mild to moderate canal stenosis and severe lower facet  arthropathy with foraminal narrowing. An MRI could better  characterize the canal and foramina if clinically warranted.
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
## 2021-10-21 ENCOUNTER — Encounter (HOSPITAL_BASED_OUTPATIENT_CLINIC_OR_DEPARTMENT_OTHER): Payer: Self-pay | Admitting: Emergency Medicine

## 2021-10-21 ENCOUNTER — Emergency Department (HOSPITAL_BASED_OUTPATIENT_CLINIC_OR_DEPARTMENT_OTHER)
Admission: EM | Admit: 2021-10-21 | Discharge: 2021-10-21 | Disposition: A | Discharge status: Home / Self Care | Payer: Medicare PPO | Attending: Emergency Medicine | Admitting: Emergency Medicine

## 2021-10-21 ENCOUNTER — Other Ambulatory Visit: Payer: Self-pay

## 2021-10-21 ENCOUNTER — Encounter: Payer: Self-pay | Admitting: Hematology

## 2021-10-21 ENCOUNTER — Emergency Department (HOSPITAL_BASED_OUTPATIENT_CLINIC_OR_DEPARTMENT_OTHER): Payer: Medicare PPO

## 2021-10-21 DIAGNOSIS — Z7951 Long term (current) use of inhaled steroids: Secondary | ICD-10-CM | POA: Diagnosis not present

## 2021-10-21 DIAGNOSIS — I129 Hypertensive chronic kidney disease with stage 1 through stage 4 chronic kidney disease, or unspecified chronic kidney disease: Secondary | ICD-10-CM | POA: Diagnosis not present

## 2021-10-21 DIAGNOSIS — Z85118 Personal history of other malignant neoplasm of bronchus and lung: Secondary | ICD-10-CM | POA: Insufficient documentation

## 2021-10-21 DIAGNOSIS — R519 Headache, unspecified: Secondary | ICD-10-CM | POA: Diagnosis not present

## 2021-10-21 DIAGNOSIS — J449 Chronic obstructive pulmonary disease, unspecified: Secondary | ICD-10-CM | POA: Diagnosis not present

## 2021-10-21 DIAGNOSIS — E1122 Type 2 diabetes mellitus with diabetic chronic kidney disease: Secondary | ICD-10-CM | POA: Insufficient documentation

## 2021-10-21 DIAGNOSIS — R600 Localized edema: Secondary | ICD-10-CM | POA: Insufficient documentation

## 2021-10-21 DIAGNOSIS — Z7984 Long term (current) use of oral hypoglycemic drugs: Secondary | ICD-10-CM | POA: Insufficient documentation

## 2021-10-21 DIAGNOSIS — Z794 Long term (current) use of insulin: Secondary | ICD-10-CM | POA: Diagnosis not present

## 2021-10-21 DIAGNOSIS — I10 Essential (primary) hypertension: Secondary | ICD-10-CM

## 2021-10-21 DIAGNOSIS — R079 Chest pain, unspecified: Secondary | ICD-10-CM | POA: Diagnosis present

## 2021-10-21 DIAGNOSIS — N189 Chronic kidney disease, unspecified: Secondary | ICD-10-CM | POA: Diagnosis not present

## 2021-10-21 DIAGNOSIS — Z79899 Other long term (current) drug therapy: Secondary | ICD-10-CM | POA: Diagnosis not present

## 2021-10-21 LAB — BASIC METABOLIC PANEL
Anion gap: 3 — ABNORMAL LOW (ref 5–15)
BUN: 20 mg/dL (ref 8–23)
CO2: 24 mmol/L (ref 22–32)
Calcium: 8.3 mg/dL — ABNORMAL LOW (ref 8.9–10.3)
Chloride: 113 mmol/L — ABNORMAL HIGH (ref 98–111)
Creatinine, Ser: 1.71 mg/dL — ABNORMAL HIGH (ref 0.61–1.24)
GFR, Estimated: 44 mL/min — ABNORMAL LOW (ref >60)
Glucose, Bld: 135 mg/dL — ABNORMAL HIGH (ref 70–99)
Potassium: 3.5 mmol/L (ref 3.5–5.1)
Sodium: 140 mmol/L (ref 135–145)

## 2021-10-21 LAB — CBC
HCT: 30.8 % — ABNORMAL LOW (ref 39.0–52.0)
Hemoglobin: 10.2 g/dL — ABNORMAL LOW (ref 13.0–17.0)
MCH: 29.9 pg (ref 26.0–34.0)
MCHC: 33.1 g/dL (ref 30.0–36.0)
MCV: 90.3 fL (ref 80.0–100.0)
Platelets: 208 10*3/uL (ref 150–400)
RBC: 3.41 MIL/uL — ABNORMAL LOW (ref 4.22–5.81)
RDW: 13.3 % (ref 11.5–15.5)
WBC: 6 10*3/uL (ref 4.0–10.5)
nRBC: 0 % (ref 0.0–0.2)

## 2021-10-21 LAB — TROPONIN I (HIGH SENSITIVITY)
Troponin I (High Sensitivity): 9 ng/L (ref <18)
Troponin I (High Sensitivity): 9 ng/L (ref <18)

## 2021-10-21 MED ORDER — ACETAMINOPHEN 325 MG PO TABS
650.0000 mg | ORAL_TABLET | Freq: Once | ORAL | Status: AC
  Administered 2021-10-21: 650 mg via ORAL
  Filled 2021-10-21: qty 2

## 2021-10-21 MED ORDER — HEPARIN SOD (PORK) LOCK FLUSH 100 UNIT/ML IV SOLN
500.0000 [IU] | Freq: Once | INTRAVENOUS | Status: AC
  Administered 2021-10-21: 500 [IU]
  Filled 2021-10-21: qty 5

## 2021-10-21 MED ORDER — HYDRALAZINE HCL 20 MG/ML IJ SOLN
10.0000 mg | Freq: Once | INTRAMUSCULAR | Status: AC
  Administered 2021-10-21: 10 mg via INTRAVENOUS
  Filled 2021-10-21: qty 1

## 2021-10-21 MED ORDER — AMLODIPINE BESYLATE 5 MG PO TABS
5.0000 mg | ORAL_TABLET | Freq: Once | ORAL | Status: AC
  Administered 2021-10-21: 5 mg via ORAL
  Filled 2021-10-21: qty 1

## 2021-10-21 NOTE — ED Triage Notes (Signed)
Pt arrives pov, to triage in wheelchair, c/o HA, and non-radiating CP that started last night. Also reports hypertension, reports compliance with b/p meds

## 2021-10-21 NOTE — Discharge Instructions (Signed)
Please follow-up with your PCP about your hypertension.  You should call them first thing tomorrow morning.  Return with any worsening symptoms.

## 2021-10-21 NOTE — ED Provider Notes (Signed)
[facility]PART[facility] Provider Note   CSN: [id number] Arrival date & time: 10/21/21  1746     History  Chief Complaint  Patient presents with   Chest Pain    Travis [name] is a 66 y.o. male with a past medical history of COPD, diabetes, chronic kidney disease and lung cancer in remission presenting today with chest pain and headache in the setting of hypertension.  Reports that his blood pressure was high on Friday, around 190/94 however it came down over the weekend.  Earlier today he began to have a frontal headache and anterior chest pain and when he checked his blood pressure was 200s over 100s.  He takes "lots of things for his blood pressure".  No blurred vision, back pain, double vision or discomfort in his chest at this time.  Says he still has a mild headache.   Chest Pain Associated symptoms: weakness   Associated symptoms: no dizziness, no palpitations and no shortness of breath        Home Medications Prior to Admission medications   Medication Sig Start Date End Date Taking? Authorizing Provider  albuterol (VENTOLIN HFA) 108 (90 Base) MCG/ACT inhaler Inhale 2 puffs into the lungs every 6 (six) hours as needed for wheezing or shortness of breath. 07/09/18   Tish Men, MD  amitriptyline (ELAVIL) 50 MG tablet Take 50 mg by mouth at bedtime.    [provider]  amLODipine (NORVASC) 5 MG tablet Take 5 mg by mouth daily. 04/10/18   [provider]  budesonide-formoterol (SYMBICORT) 160-4.5 MCG/ACT inhaler Inhale 2 puffs into the lungs 2 (two) times daily. 07/09/18   Tish Men, MD  DULoxetine (CYMBALTA) 30 MG capsule Take 30 mg by mouth daily. 09/13/19   [provider]  empagliflozin (JARDIANCE) 25 MG TABS tablet Take 1 tablet (25 mg total) by mouth daily. 10/16/20   Mercy Riding, MD  famotidine (PEPCID) 20 MG tablet Take 20 mg by mouth daily. 03/11/19   [provider]  gabapentin (NEURONTIN) 300 MG capsule Take 2  capsules (600 mg total) by mouth 2 (two) times daily. 10/08/20   Mercy Riding, MD  glimepiride (AMARYL) 2 MG tablet Take 2 mg by mouth daily with breakfast.  08/01/18   [provider]  glucose blood test strip 4x a day 07/01/18   Shamleffer, Melanie Crazier, MD  insulin aspart (NOVOLOG) 100 UNIT/ML FlexPen Inject 6-16 Units into the skin See admin instructions. BS 180-200 add extra 2 units BS 201-250 add 5 units BS 251-300 add 7 units 301-350 add 10 units 351-400 add 12 units  bs >400 call PCP 03/25/19   [provider]  insulin glargine (LANTUS) 100 UNIT/ML Solostar Pen Inject 35 Units into the skin 2 (two) times daily for 3 days, THEN 35 Units daily. 10/08/20 11/10/20  Mercy Riding, MD  Insulin Pen Needle (BD PEN NEEDLE NANO U/F) 32G X 4 MM MISC 4x daily 07/01/18   Shamleffer, Melanie Crazier, MD  ketoconazole (NIZORAL) 2 % cream Apply 1 application topically 2 (two) times daily as needed for irritation. 09/28/19   [provider]  lidocaine (LIDODERM) 5 % Place 1 patch onto the skin daily. Remove & Discard patch within 12 hours or as directed by MD 09/07/21   Hayden Rasmussen, MD  lidocaine (XYLOCAINE) 2 % solution Patient: Mix 1part 2% viscous lidocaine, 1part H20. Swish & swallow 59mL of diluted mixture, 58min before meals and at bedtime, up to QID Patient taking  differently: Use as directed 15 mLs in the mouth or throat 4 (four) times daily as needed for mouth pain. Patient: Mix 1part 2% viscous lidocaine, 1part H20. Swish & swallow 87mL of diluted mixture, 41min before meals and at bedtime, up to QID 05/29/18   Tish Men, MD  losartan (COZAAR) 25 MG tablet Take 1 tablet (25 mg total) by mouth daily. 10/16/20   Mercy Riding, MD  magnesium oxide (MAG-OX) 400 (241.3 Mg) MG tablet Take 1 tablet by mouth 2 (two) times daily. 03/15/19   [provider]  morphine (MSIR) 15 MG tablet Take 15 mg by mouth every 8 (eight) hours as needed for pain. Patient not taking: Reported on  10/07/2020 01/22/19   [provider]  ondansetron (ZOFRAN ODT) 4 MG disintegrating tablet Take 1 tablet (4 mg total) by mouth every 8 (eight) hours as needed for nausea or vomiting. Patient not taking: No sig reported 03/02/20   Lucrezia Starch, MD  pantoprazole (PROTONIX) 40 MG tablet Take 40 mg by mouth daily. 03/05/19   [provider]  polyethylene glycol (MIRALAX / GLYCOLAX) 17 g packet Take 17 g by mouth daily. Patient not taking: No sig reported 04/03/19   Alma Friendly, MD  potassium chloride (KLOR-CON) 10 MEQ tablet Take 1 tablet (10 mEq total) by mouth daily. 10/16/20   Mercy Riding, MD  rosuvastatin (CRESTOR) 10 MG tablet Take 10 mg by mouth daily. 09/03/19   [provider]  tizanidine (ZANAFLEX) 2 MG capsule Take 1 capsule (2 mg total) by mouth 3 (three) times daily as needed for muscle spasms. 09/04/21   Azucena Cecil, PA-C  torsemide (DEMADEX) 20 MG tablet Take 1 tablet (20 mg total) by mouth 2 (two) times daily. 10/16/20   Mercy Riding, MD  traMADol (ULTRAM) 50 MG tablet Take 1 tablet (50 mg total) by mouth every 8 (eight) hours as needed for moderate pain. 09/04/18   Tish Men, MD  TRELEGY ELLIPTA 100-62.5-25 MCG/INH AEPB Inhale 1 puff into the lungs daily. 02/26/19   [provider]  Vitamin D, Ergocalciferol, (DRISDOL) 1.25 MG (50000 UNIT) CAPS capsule Take 50,000 Units by mouth once a week. 08/14/20   [provider]  prochlorperazine (COMPAZINE) 10 MG tablet Take 1 tablet (10 mg total) by mouth every 6 (six) hours as needed (Nausea or vomiting). 04/24/18 08/04/18  Tish Men, MD      Allergies    Patient has no known allergies.    Review of Systems   Review of Systems  Eyes:  Negative for photophobia and pain.  Respiratory:  Negative for chest tightness and shortness of breath.   Cardiovascular:  Positive for chest pain. Negative for palpitations.  Neurological:  Positive for weakness and light-headedness. Negative for  dizziness.    Physical Exam Updated Vital Signs BP (!) 179/83 (BP Location: Right Arm)   Pulse 78   Temp 97.7 F (36.5 C) (Oral)   Resp 14   Wt 91.6 kg   SpO2 96%   BMI 28.98 kg/m  Physical Exam Vitals and nursing note reviewed.  Constitutional:      General: He is not in acute distress.    Appearance: Normal appearance. He is not ill-appearing.  HENT:     Head: Normocephalic and atraumatic.  Eyes:     General: No scleral icterus.    Conjunctiva/sclera: Conjunctivae normal.  Cardiovascular:     Rate and Rhythm: Normal rate and regular rhythm. No extrasystoles are present.  Heart sounds: Murmur heard.     Systolic murmur is present.  Pulmonary:     Effort: Pulmonary effort is normal. No respiratory distress.  Chest:     Chest wall: No tenderness.  Musculoskeletal:     Right lower leg: Edema present.     Left lower leg: Edema present.  Skin:    General: Skin is warm and dry.     Findings: No rash.  Neurological:     Mental Status: He is alert.     Comments: Cranial nerves II through XII grossly intact.  Moving all extremities, 5 out of 5 strength throughout.  Psychiatric:        Mood and Affect: Mood normal.     ED Results / Procedures / Treatments   Labs (all labs ordered are listed, but only abnormal results are displayed) Labs Reviewed  BASIC METABOLIC PANEL - Abnormal; Notable for the following components:      Result Value   Chloride 113 (*)    Glucose, Bld 135 (*)    Creatinine, Ser 1.71 (*)    Calcium 8.3 (*)    GFR, Estimated 44 (*)    Anion gap 3 (*)    All other components within normal limits  CBC - Abnormal; Notable for the following components:   RBC 3.41 (*)    Hemoglobin 10.2 (*)    HCT 30.8 (*)    All other components within normal limits  TROPONIN I (HIGH SENSITIVITY)  TROPONIN I (HIGH SENSITIVITY)    EKG None  Radiology DG Chest 2 View  Result Date: 10/21/2021 CLINICAL DATA:  Chest pain. EXAM: CHEST - 2 VIEW COMPARISON:   Chest radiograph dated 09/04/2021. FINDINGS: Left-sided Port-A-Cath in similar position. Similar appearance of chronic right hilar density. No new consolidation. There is no pleural effusion pneumothorax. The cardiac silhouette is within normal limits. No acute osseous pathology. IMPRESSION: No active cardiopulmonary disease.  No interval change. Electronically Signed   By: Anner Crete M.D.   On: 10/21/2021 19:01    Procedures Procedures   Medications Ordered in ED Medications  hydrALAZINE (APRESOLINE) injection 10 mg (10 mg Intravenous Given 10/21/21 1943)  acetaminophen (TYLENOL) tablet 650 mg (650 mg Oral Given 10/21/21 1942)  amLODipine (NORVASC) tablet 5 mg (5 mg Oral Given 10/21/21 2135)  hydrALAZINE (APRESOLINE) injection 10 mg (10 mg Intravenous Given 10/21/21 2136)    ED Course/ Medical Decision Making/ A&P Clinical Course as of 10/21/21 1928  Sun Oct 21, 2021  1928 DG Chest 2 View [MR]    Clinical Course User Index [MR] Rhae Hammock, PA-C                           Medical Decision Making Amount and/or Complexity of Data Reviewed Labs: ordered. Radiology: ordered. Decision-making details documented in ED Course.  Risk OTC drugs. Prescription drug management.   This patient presents to the ED for concern of headache, chest pain and hypertension.  Concerns include hypertensive urgency/emergency.  This is not an exhaustive differential.    Past Medical History / Co-morbidities / Social History: COPD, chronic kidney disease, diabetes, hypertension and lung cancer in remission   Additional history: Per chart review patient is currently on amlodipine, losartan and torsemide.  Followed for his lung cancer at [facility], no longer on chemotherapy.   Physical Exam: No pertinent physical exam findings.  Lab Tests: I ordered, and personally interpreted labs.  The pertinent results include:  -Negative  troponin x2 -Creatinine 1.71, GFR 44.  Per chart review this is  baseline   Imaging Studies: I ordered and independently visualized and interpreted head CT and chest x-ray which showed no abnormalities.  No signs of mass or malignancy on chest x-ray.  I agree with the radiologist interpretation.   Cardiac Monitoring:  Remained in normal sinus rhythm throughout his stay.  EKG without concerning findings.   Medications: I ordered medication including Tylenol for headache and hydralazine for hypertension. Reevaluation of the patient after these medicines showed that the patient improved.  Blood pressure is now down to 158/82.   Disposition: 66 year old male presenting with concern for headache and chest pain in the setting of hypertension.  My original concern was a hypertension emergency.  His neurologic exam was normal and he denied any chest pain at this time.  CT head and x-ray were negative.  Also with 2 negative troponins.  Low suspicion ACS or dissection.  No signs of intracranial hemorrhage.  Blood pressure was successfully lowered with hydralazine.  After consideration of the labs and imaging I believe patient is stable for discharge home.  No signs of hypertensive emergency.  Kidney function is at baseline.  He is agreeable to outpatient follow-up about better blood pressure control because he has been compliant with his medications and continues to experience hypertension.  Hemodynamically intact and ambulated out of the department.    Final Clinical Impression(s) / ED Diagnoses Final diagnoses:  Hypertension, unspecified type  Chest pain, unspecified type    Rx / DC Orders ED Discharge Orders     None      Results and diagnoses were explained to the patient. Return precautions discussed in full. Patient had no additional questions and expressed complete understanding.   This chart was dictated using voice recognition software.  Despite best efforts to proofread,  errors can occur which can change the documentation meaning.    Rhae Hammock, PA-C 10/21/21 [place], [place], DO 10/21/21 2314

## 2021-10-21 NOTE — ED Notes (Signed)
Patient transported to CT via stretcher.

## 2021-10-23 ENCOUNTER — Encounter: Payer: Self-pay | Admitting: Hematology

## 2021-12-22 ENCOUNTER — Emergency Department (HOSPITAL_BASED_OUTPATIENT_CLINIC_OR_DEPARTMENT_OTHER): Payer: Medicare PPO

## 2021-12-22 ENCOUNTER — Encounter (HOSPITAL_BASED_OUTPATIENT_CLINIC_OR_DEPARTMENT_OTHER): Payer: Self-pay | Admitting: Emergency Medicine

## 2021-12-22 ENCOUNTER — Other Ambulatory Visit: Payer: Self-pay

## 2021-12-22 ENCOUNTER — Inpatient Hospital Stay (HOSPITAL_BASED_OUTPATIENT_CLINIC_OR_DEPARTMENT_OTHER)
Admission: EM | Admit: 2021-12-22 | Discharge: 2021-12-24 | DRG: 062 | Disposition: A | Discharge status: Home / Self Care | Payer: Medicare PPO | Attending: Neurology | Admitting: Neurology

## 2021-12-22 DIAGNOSIS — I129 Hypertensive chronic kidney disease with stage 1 through stage 4 chronic kidney disease, or unspecified chronic kidney disease: Secondary | ICD-10-CM | POA: Diagnosis present

## 2021-12-22 DIAGNOSIS — I639 Cerebral infarction, unspecified: Secondary | ICD-10-CM | POA: Diagnosis not present

## 2021-12-22 DIAGNOSIS — Z79899 Other long term (current) drug therapy: Secondary | ICD-10-CM | POA: Diagnosis not present

## 2021-12-22 DIAGNOSIS — Z923 Personal history of irradiation: Secondary | ICD-10-CM

## 2021-12-22 DIAGNOSIS — R531 Weakness: Secondary | ICD-10-CM | POA: Diagnosis not present

## 2021-12-22 DIAGNOSIS — Z7951 Long term (current) use of inhaled steroids: Secondary | ICD-10-CM

## 2021-12-22 DIAGNOSIS — Z806 Family history of leukemia: Secondary | ICD-10-CM | POA: Diagnosis not present

## 2021-12-22 DIAGNOSIS — I10 Essential (primary) hypertension: Secondary | ICD-10-CM

## 2021-12-22 DIAGNOSIS — E7800 Pure hypercholesterolemia, unspecified: Secondary | ICD-10-CM | POA: Diagnosis not present

## 2021-12-22 DIAGNOSIS — M1990 Unspecified osteoarthritis, unspecified site: Secondary | ICD-10-CM | POA: Diagnosis present

## 2021-12-22 DIAGNOSIS — E1122 Type 2 diabetes mellitus with diabetic chronic kidney disease: Secondary | ICD-10-CM | POA: Diagnosis present

## 2021-12-22 DIAGNOSIS — R29705 NIHSS score 5: Secondary | ICD-10-CM | POA: Diagnosis present

## 2021-12-22 DIAGNOSIS — J449 Chronic obstructive pulmonary disease, unspecified: Secondary | ICD-10-CM | POA: Diagnosis present

## 2021-12-22 DIAGNOSIS — R200 Anesthesia of skin: Secondary | ICD-10-CM

## 2021-12-22 DIAGNOSIS — Z833 Family history of diabetes mellitus: Secondary | ICD-10-CM | POA: Diagnosis not present

## 2021-12-22 DIAGNOSIS — N179 Acute kidney failure, unspecified: Secondary | ICD-10-CM | POA: Diagnosis present

## 2021-12-22 DIAGNOSIS — G8314 Monoplegia of lower limb affecting left nondominant side: Secondary | ICD-10-CM | POA: Diagnosis present

## 2021-12-22 DIAGNOSIS — R4182 Altered mental status, unspecified: Secondary | ICD-10-CM | POA: Diagnosis present

## 2021-12-22 DIAGNOSIS — N1832 Chronic kidney disease, stage 3b: Secondary | ICD-10-CM | POA: Diagnosis present

## 2021-12-22 DIAGNOSIS — I6340 Cerebral infarction due to embolism of unspecified cerebral artery: Principal | ICD-10-CM

## 2021-12-22 DIAGNOSIS — Z85118 Personal history of other malignant neoplasm of bronchus and lung: Secondary | ICD-10-CM | POA: Diagnosis not present

## 2021-12-22 DIAGNOSIS — R29810 Facial weakness: Secondary | ICD-10-CM | POA: Diagnosis present

## 2021-12-22 DIAGNOSIS — E1142 Type 2 diabetes mellitus with diabetic polyneuropathy: Secondary | ICD-10-CM

## 2021-12-22 DIAGNOSIS — Z794 Long term (current) use of insulin: Secondary | ICD-10-CM | POA: Diagnosis not present

## 2021-12-22 DIAGNOSIS — N1831 Chronic kidney disease, stage 3a: Secondary | ICD-10-CM

## 2021-12-22 DIAGNOSIS — R471 Dysarthria and anarthria: Secondary | ICD-10-CM | POA: Diagnosis present

## 2021-12-22 DIAGNOSIS — Z9221 Personal history of antineoplastic chemotherapy: Secondary | ICD-10-CM | POA: Diagnosis not present

## 2021-12-22 DIAGNOSIS — Z87891 Personal history of nicotine dependence: Secondary | ICD-10-CM

## 2021-12-22 DIAGNOSIS — K219 Gastro-esophageal reflux disease without esophagitis: Secondary | ICD-10-CM | POA: Diagnosis present

## 2021-12-22 DIAGNOSIS — E785 Hyperlipidemia, unspecified: Secondary | ICD-10-CM | POA: Diagnosis present

## 2021-12-22 DIAGNOSIS — E1165 Type 2 diabetes mellitus with hyperglycemia: Secondary | ICD-10-CM | POA: Diagnosis present

## 2021-12-22 DIAGNOSIS — E11649 Type 2 diabetes mellitus with hypoglycemia without coma: Secondary | ICD-10-CM | POA: Diagnosis not present

## 2021-12-22 DIAGNOSIS — G459 Transient cerebral ischemic attack, unspecified: Secondary | ICD-10-CM | POA: Diagnosis present

## 2021-12-22 DIAGNOSIS — I6389 Other cerebral infarction: Secondary | ICD-10-CM | POA: Diagnosis not present

## 2021-12-22 DIAGNOSIS — Z9189 Other specified personal risk factors, not elsewhere classified: Secondary | ICD-10-CM

## 2021-12-22 DIAGNOSIS — Z7984 Long term (current) use of oral hypoglycemic drugs: Secondary | ICD-10-CM

## 2021-12-22 LAB — URINALYSIS, MICROSCOPIC (REFLEX)

## 2021-12-22 LAB — COMPREHENSIVE METABOLIC PANEL
ALT: 23 U/L (ref 0–44)
AST: 16 U/L (ref 15–41)
Albumin: 3 g/dL — ABNORMAL LOW (ref 3.5–5.0)
Alkaline Phosphatase: 80 U/L (ref 38–126)
Anion gap: 7 (ref 5–15)
BUN: 22 mg/dL (ref 8–23)
CO2: 24 mmol/L (ref 22–32)
Calcium: 8.4 mg/dL — ABNORMAL LOW (ref 8.9–10.3)
Chloride: 107 mmol/L (ref 98–111)
Creatinine, Ser: 1.66 mg/dL — ABNORMAL HIGH (ref 0.61–1.24)
GFR, Estimated: 46 mL/min — ABNORMAL LOW (ref >60)
Glucose, Bld: 297 mg/dL — ABNORMAL HIGH (ref 70–99)
Potassium: 4 mmol/L (ref 3.5–5.1)
Sodium: 138 mmol/L (ref 135–145)
Total Bilirubin: 0.4 mg/dL (ref 0.3–1.2)
Total Protein: 6.4 g/dL — ABNORMAL LOW (ref 6.5–8.1)

## 2021-12-22 LAB — CBC WITH DIFFERENTIAL/PLATELET
Abs Immature Granulocytes: 0.03 10*3/uL (ref 0.00–0.07)
Basophils Absolute: 0.1 10*3/uL (ref 0.0–0.1)
Basophils Relative: 1 %
Eosinophils Absolute: 0.2 10*3/uL (ref 0.0–0.5)
Eosinophils Relative: 2 %
HCT: 33.6 % — ABNORMAL LOW (ref 39.0–52.0)
Hemoglobin: 11.1 g/dL — ABNORMAL LOW (ref 13.0–17.0)
Immature Granulocytes: 0 %
Lymphocytes Relative: 16 %
Lymphs Abs: 1.4 10*3/uL (ref 0.7–4.0)
MCH: 29.8 pg (ref 26.0–34.0)
MCHC: 33 g/dL (ref 30.0–36.0)
MCV: 90.3 fL (ref 80.0–100.0)
Monocytes Absolute: 0.6 10*3/uL (ref 0.1–1.0)
Monocytes Relative: 7 %
Neutro Abs: 6.3 10*3/uL (ref 1.7–7.7)
Neutrophils Relative %: 74 %
Platelets: 274 10*3/uL (ref 150–400)
RBC: 3.72 MIL/uL — ABNORMAL LOW (ref 4.22–5.81)
RDW: 12.7 % (ref 11.5–15.5)
WBC: 8.4 10*3/uL (ref 4.0–10.5)
nRBC: 0 % (ref 0.0–0.2)

## 2021-12-22 LAB — RAPID URINE DRUG SCREEN, HOSP PERFORMED
Amphetamines: NOT DETECTED
Barbiturates: NOT DETECTED
Benzodiazepines: NOT DETECTED
Cocaine: NOT DETECTED
Opiates: NOT DETECTED
Tetrahydrocannabinol: NOT DETECTED

## 2021-12-22 LAB — URINALYSIS, ROUTINE W REFLEX MICROSCOPIC
Bilirubin Urine: NEGATIVE
Glucose, UA: >=500 mg/dL — AB
Hgb urine dipstick: NEGATIVE
Ketones, ur: NEGATIVE mg/dL
Leukocytes,Ua: NEGATIVE
Nitrite: NEGATIVE
Protein, ur: 100 mg/dL — AB
Specific Gravity, Urine: 1.015 (ref 1.005–1.030)
pH: 5.5 (ref 5.0–8.0)

## 2021-12-22 LAB — GLUCOSE, CAPILLARY
Glucose-Capillary: 181 mg/dL — ABNORMAL HIGH (ref 70–99)
Glucose-Capillary: 248 mg/dL — ABNORMAL HIGH (ref 70–99)

## 2021-12-22 LAB — CBG MONITORING, ED: Glucose-Capillary: 277 mg/dL — ABNORMAL HIGH (ref 70–99)

## 2021-12-22 LAB — TROPONIN I (HIGH SENSITIVITY)
Troponin I (High Sensitivity): 5 ng/L (ref <18)
Troponin I (High Sensitivity): 6 ng/L (ref <18)

## 2021-12-22 MED ORDER — ROSUVASTATIN CALCIUM 5 MG PO TABS
10.0000 mg | ORAL_TABLET | Freq: Every day | ORAL | Status: DC
  Administered 2021-12-23: 10 mg via ORAL
  Filled 2021-12-22: qty 2

## 2021-12-22 MED ORDER — TENECTEPLASE FOR STROKE: 0.2500 mg/kg | PACK | Freq: Once | INTRAVENOUS | Status: AC

## 2021-12-22 MED ORDER — ALBUTEROL SULFATE (2.5 MG/3ML) 0.083% IN NEBU: 2.5000 mg | INHALATION_SOLUTION | Freq: Four times a day (QID) | RESPIRATORY_TRACT | Status: DC | PRN

## 2021-12-22 MED ORDER — DULOXETINE HCL 30 MG PO CPEP
30.0000 mg | ORAL_CAPSULE | Freq: Every day | ORAL | Status: DC
  Administered 2021-12-23 – 2021-12-24 (×2): 30 mg via ORAL
  Filled 2021-12-22 (×2): qty 1

## 2021-12-22 MED ORDER — ACETAMINOPHEN 650 MG RE SUPP: 650.0000 mg | RECTAL | Status: DC | PRN

## 2021-12-22 MED ORDER — HYDROMORPHONE HCL 1 MG/ML IJ SOLN
0.5000 mg | Freq: Once | INTRAMUSCULAR | Status: AC
  Administered 2021-12-22: 0.5 mg via INTRAVENOUS
  Filled 2021-12-22: qty 1

## 2021-12-22 MED ORDER — PANTOPRAZOLE SODIUM 40 MG PO TBEC
40.0000 mg | DELAYED_RELEASE_TABLET | Freq: Every day | ORAL | Status: DC
  Administered 2021-12-23 – 2021-12-24 (×2): 40 mg via ORAL
  Filled 2021-12-22 (×2): qty 1

## 2021-12-22 MED ORDER — INSULIN ASPART 100 UNIT/ML IJ SOLN
0.0000 [IU] | Freq: Three times a day (TID) | INTRAMUSCULAR | Status: DC
  Administered 2021-12-23: 2 [IU] via SUBCUTANEOUS
  Administered 2021-12-23: 5 [IU] via SUBCUTANEOUS
  Administered 2021-12-23: 3 [IU] via SUBCUTANEOUS
  Administered 2021-12-24: 8 [IU] via SUBCUTANEOUS
  Administered 2021-12-24: 5 [IU] via SUBCUTANEOUS

## 2021-12-22 MED ORDER — ACETAMINOPHEN 160 MG/5ML PO SOLN: 650.0000 mg | ORAL | Status: DC | PRN

## 2021-12-22 MED ORDER — CLEVIDIPINE BUTYRATE 0.5 MG/ML IV EMUL
0.0000 mg/h | INTRAVENOUS | Status: DC
  Filled 2021-12-22: qty 100

## 2021-12-22 MED ORDER — IOHEXOL 350 MG/ML SOLN
150.0000 mL | Freq: Once | INTRAVENOUS | Status: AC | PRN
  Administered 2021-12-22: 100 mL via INTRAVENOUS

## 2021-12-22 MED ORDER — SODIUM CHLORIDE 0.9% FLUSH
10.0000 mL | Freq: Once | INTRAVENOUS | Status: AC
  Administered 2021-12-22: 10 mL via INTRAVENOUS
  Filled 2021-12-22: qty 10

## 2021-12-22 MED ORDER — SENNOSIDES-DOCUSATE SODIUM 8.6-50 MG PO TABS: 1.0000 | ORAL_TABLET | Freq: Every evening | ORAL | Status: DC | PRN

## 2021-12-22 MED ORDER — LABETALOL HCL 5 MG/ML IV SOLN: 10.0000 mg | INTRAVENOUS | Status: DC | PRN

## 2021-12-22 MED ORDER — LIDOCAINE VISCOUS HCL 2 % MT SOLN: 15.0000 mL | Freq: Four times a day (QID) | OROMUCOSAL | Status: DC | PRN

## 2021-12-22 MED ORDER — ACETAMINOPHEN 325 MG PO TABS
650.0000 mg | ORAL_TABLET | ORAL | Status: DC | PRN
  Administered 2021-12-22 – 2021-12-23 (×2): 650 mg via ORAL
  Filled 2021-12-22: qty 2

## 2021-12-22 MED ORDER — ORAL CARE MOUTH RINSE: 15.0000 mL | OROMUCOSAL | Status: DC | PRN

## 2021-12-22 MED ORDER — AMITRIPTYLINE HCL 50 MG PO TABS
50.0000 mg | ORAL_TABLET | Freq: Every day | ORAL | Status: DC
  Administered 2021-12-22 – 2021-12-23 (×2): 50 mg via ORAL
  Filled 2021-12-22 (×3): qty 1

## 2021-12-22 MED ORDER — STROKE: EARLY STAGES OF RECOVERY BOOK
Freq: Once | Status: AC
  Filled 2021-12-22: qty 1

## 2021-12-22 MED ORDER — GABAPENTIN 300 MG PO CAPS
600.0000 mg | ORAL_CAPSULE | Freq: Two times a day (BID) | ORAL | Status: DC
  Administered 2021-12-22 – 2021-12-24 (×4): 600 mg via ORAL
  Filled 2021-12-22 (×4): qty 2

## 2021-12-22 MED ORDER — TENECTEPLASE FOR STROKE
PACK | INTRAVENOUS | Status: AC
  Administered 2021-12-22: 22 mg via INTRAVENOUS
  Filled 2021-12-22: qty 10

## 2021-12-22 MED ORDER — INSULIN ASPART 100 UNIT/ML IJ SOLN
0.0000 [IU] | Freq: Every day | INTRAMUSCULAR | Status: DC
  Administered 2021-12-22 – 2021-12-23 (×2): 2 [IU] via SUBCUTANEOUS

## 2021-12-22 MED ORDER — TIZANIDINE HCL 2 MG PO TABS: 2.0000 mg | ORAL_TABLET | Freq: Three times a day (TID) | ORAL | Status: DC | PRN

## 2021-12-22 MED ORDER — ONDANSETRON HCL 4 MG/2ML IJ SOLN
4.0000 mg | Freq: Once | INTRAMUSCULAR | Status: AC
  Administered 2021-12-22: 4 mg via INTRAVENOUS
  Filled 2021-12-22: qty 2

## 2021-12-22 NOTE — ED Provider Notes (Signed)
[facility] Provider Note   CSN: [id number] Arrival date & time: 12/22/21  1148     History  Chief Complaint  Patient presents with   Chest Pain   Altered Mental Status    Travis [name] is a 66 y.o. male.  66 yo M with a chief complaints of sudden onset chest head and neck pain.  This started about an hour or so ago.  The patient is a bit confused and has trouble providing full history.  He has trouble describing the discomfort.  He is unsure if anything makes it better or worse.  Is unsure what he was doing when it started.  Denies any alcohol or illegal drugs this morning.  He denies cough congestion or fever.   Chest Pain Associated symptoms: altered mental status   Altered Mental Status      Home Medications Prior to Admission medications   Medication Sig Start Date End Date Taking? Authorizing Provider  albuterol (VENTOLIN HFA) 108 (90 Base) MCG/ACT inhaler Inhale 2 puffs into the lungs every 6 (six) hours as needed for wheezing or shortness of breath. 07/09/18   Tish Men, MD  amitriptyline (ELAVIL) 50 MG tablet Take 50 mg by mouth at bedtime.    [provider]  amLODipine (NORVASC) 5 MG tablet Take 5 mg by mouth daily. 04/10/18   [provider]  budesonide-formoterol (SYMBICORT) 160-4.5 MCG/ACT inhaler Inhale 2 puffs into the lungs 2 (two) times daily. 07/09/18   Tish Men, MD  DULoxetine (CYMBALTA) 30 MG capsule Take 30 mg by mouth daily. 09/13/19   [provider]  empagliflozin (JARDIANCE) 25 MG TABS tablet Take 1 tablet (25 mg total) by mouth daily. 10/16/20   Mercy Riding, MD  famotidine (PEPCID) 20 MG tablet Take 20 mg by mouth daily. 03/11/19   [provider]  gabapentin (NEURONTIN) 300 MG capsule Take 2 capsules (600 mg total) by mouth 2 (two) times daily. 10/08/20   Mercy Riding, MD  glimepiride (AMARYL) 2 MG tablet Take 2 mg by mouth daily with breakfast.  08/01/18   [provider]   glucose blood test strip 4x a day 07/01/18   Shamleffer, Melanie Crazier, MD  insulin aspart (NOVOLOG) 100 UNIT/ML FlexPen Inject 6-16 Units into the skin See admin instructions. BS 180-200 add extra 2 units BS 201-250 add 5 units BS 251-300 add 7 units 301-350 add 10 units 351-400 add 12 units  bs >400 call PCP 03/25/19   [provider]  insulin glargine (LANTUS) 100 UNIT/ML Solostar Pen Inject 35 Units into the skin 2 (two) times daily for 3 days, THEN 35 Units daily. 10/08/20 11/10/20  Mercy Riding, MD  Insulin Pen Needle (BD PEN NEEDLE NANO U/F) 32G X 4 MM MISC 4x daily 07/01/18   Shamleffer, Melanie Crazier, MD  ketoconazole (NIZORAL) 2 % cream Apply 1 application topically 2 (two) times daily as needed for irritation. 09/28/19   [provider]  lidocaine (LIDODERM) 5 % Place 1 patch onto the skin daily. Remove & Discard patch within 12 hours or as directed by MD 09/07/21   Hayden Rasmussen, MD  lidocaine (XYLOCAINE) 2 % solution Patient: Mix 1part 2% viscous lidocaine, 1part H20. Swish & swallow 64mL of diluted mixture, 11min before meals and at bedtime, up to QID Patient taking differently: Use as directed 15 mLs in the mouth or throat 4 (four) times daily as needed for mouth pain. Patient: Mix 1part 2% viscous lidocaine, 1part  H20. Swish & swallow 17mL of diluted mixture, 65min before meals and at bedtime, up to QID 05/29/18   Tish Men, MD  losartan (COZAAR) 25 MG tablet Take 1 tablet (25 mg total) by mouth daily. 10/16/20   Mercy Riding, MD  magnesium oxide (MAG-OX) 400 (241.3 Mg) MG tablet Take 1 tablet by mouth 2 (two) times daily. 03/15/19   [provider]  morphine (MSIR) 15 MG tablet Take 15 mg by mouth every 8 (eight) hours as needed for pain. Patient not taking: Reported on 10/07/2020 01/22/19   [provider]  ondansetron (ZOFRAN ODT) 4 MG disintegrating tablet Take 1 tablet (4 mg total) by mouth every 8 (eight) hours as needed for nausea or  vomiting. Patient not taking: No sig reported 03/02/20   Lucrezia Starch, MD  pantoprazole (PROTONIX) 40 MG tablet Take 40 mg by mouth daily. 03/05/19   [provider]  polyethylene glycol (MIRALAX / GLYCOLAX) 17 g packet Take 17 g by mouth daily. Patient not taking: No sig reported 04/03/19   Alma Friendly, MD  potassium chloride (KLOR-CON) 10 MEQ tablet Take 1 tablet (10 mEq total) by mouth daily. 10/16/20   Mercy Riding, MD  rosuvastatin (CRESTOR) 10 MG tablet Take 10 mg by mouth daily. 09/03/19   [provider]  tizanidine (ZANAFLEX) 2 MG capsule Take 1 capsule (2 mg total) by mouth 3 (three) times daily as needed for muscle spasms. 09/04/21   Azucena Cecil, PA-C  torsemide (DEMADEX) 20 MG tablet Take 1 tablet (20 mg total) by mouth 2 (two) times daily. 10/16/20   Mercy Riding, MD  traMADol (ULTRAM) 50 MG tablet Take 1 tablet (50 mg total) by mouth every 8 (eight) hours as needed for moderate pain. 09/04/18   Tish Men, MD  TRELEGY ELLIPTA 100-62.5-25 MCG/INH AEPB Inhale 1 puff into the lungs daily. 02/26/19   [provider]  Vitamin D, Ergocalciferol, (DRISDOL) 1.25 MG (50000 UNIT) CAPS capsule Take 50,000 Units by mouth once a week. 08/14/20   [provider]  prochlorperazine (COMPAZINE) 10 MG tablet Take 1 tablet (10 mg total) by mouth every 6 (six) hours as needed (Nausea or vomiting). 04/24/18 08/04/18  Tish Men, MD      Allergies    Patient has no known allergies.    Review of Systems   Review of Systems  Cardiovascular:  Positive for chest pain.    Physical Exam Updated Vital Signs BP (!) 166/84   Pulse 69   Temp 97.7 F (36.5 C) (Oral)   Resp 20   Ht 5\' 10"  (1.778 m)   Wt 88.4 kg   SpO2 97%   BMI 27.96 kg/m  Physical Exam Vitals and nursing note reviewed.  Constitutional:      Appearance: He is well-developed.  HENT:     Head: Normocephalic and atraumatic.  Eyes:     Pupils: Pupils are equal, round, and reactive to  light.  Neck:     Vascular: No JVD.  Cardiovascular:     Rate and Rhythm: Normal rate and regular rhythm.     Heart sounds: No murmur heard.    No friction rub. No gallop.  Pulmonary:     Effort: No respiratory distress.     Breath sounds: No wheezing.  Abdominal:     General: There is no distension.     Tenderness: There is no abdominal tenderness. There is no guarding or rebound.  Musculoskeletal:  General: Normal range of motion.     Cervical back: Normal range of motion and neck supple.  Skin:    Coloration: Skin is not pale.     Findings: No rash.  Neurological:     Mental Status: He is alert and oriented to person, place, and time.     Comments: Left leg weakness compared to right.  4 out of 5.  Question right-sided facial droop.  Otherwise no obvious neurologic deficit noted on exam.  Psychiatric:        Behavior: Behavior normal.     ED Results / Procedures / Treatments   Labs (all labs ordered are listed, but only abnormal results are displayed) Labs Reviewed  CBC WITH DIFFERENTIAL/PLATELET - Abnormal; Notable for the following components:      Result Value   RBC 3.72 (*)    Hemoglobin 11.1 (*)    HCT 33.6 (*)    All other components within normal limits  COMPREHENSIVE METABOLIC PANEL - Abnormal; Notable for the following components:   Glucose, Bld 297 (*)    Creatinine, Ser 1.66 (*)    Calcium 8.4 (*)    Total Protein 6.4 (*)    Albumin 3.0 (*)    GFR, Estimated 46 (*)    All other components within normal limits  URINALYSIS, ROUTINE W REFLEX MICROSCOPIC - Abnormal; Notable for the following components:   Glucose, UA >=500 (*)    Protein, ur 100 (*)    All other components within normal limits  URINALYSIS, MICROSCOPIC (REFLEX) - Abnormal; Notable for the following components:   Bacteria, UA FEW (*)    All other components within normal limits  CBG MONITORING, ED - Abnormal; Notable for the following components:   Glucose-Capillary 277 (*)    All  other components within normal limits  RAPID URINE DRUG SCREEN, HOSP PERFORMED  TROPONIN I (HIGH SENSITIVITY)  TROPONIN I (HIGH SENSITIVITY)    EKG EKG Interpretation  Date/Time:  Saturday December 22 2021 11:57:44 EDT Ventricular Rate:  77 PR Interval:  162 QRS Duration: 90 QT Interval:  372 QTC Calculation: 420 R Axis:   76 Text Interpretation: Normal sinus rhythm T wave abnormality, consider inferior ischemia Abnormal ECG When compared with ECG of 21-Oct-2021 18:02, PREVIOUS ECG IS PRESENT downsloping st segments in inferior and lateral leads seen on prior though not most recent Confirmed by Deno Etienne [phone]) on 12/22/2021 12:00:09 PM  Radiology CT ANGIO HEAD NECK W WO CM  Result Date: 12/22/2021 CLINICAL DATA:  Provided history: Neuro deficit, acute, stroke suspected. EXAM: CT ANGIOGRAPHY HEAD AND NECK TECHNIQUE: Multidetector CT imaging of the head and neck was performed using the standard protocol during bolus administration of intravenous contrast. Multiplanar CT image reconstructions and MIPs were obtained to evaluate the vascular anatomy. Carotid stenosis measurements (when applicable) are obtained utilizing NASCET criteria, using the distal internal carotid diameter as the denominator. RADIATION DOSE REDUCTION: This exam was performed according to the departmental dose-optimization program which includes automated exposure control, adjustment of the mA and/or kV according to patient size and/or use of iterative reconstruction technique. CONTRAST:  135mL OMNIPAQUE IOHEXOL 350 MG/ML SOLN COMPARISON:  Head CT 10/21/2021. FINDINGS: CT HEAD FINDINGS Brain: No age advanced or lobar predominant parenchymal atrophy. There is no acute intracranial hemorrhage. No demarcated cortical infarct. No extra-axial fluid collection. No evidence of an intracranial mass. No midline shift. Vascular: No hyperdense vessel.  Atherosclerotic calcifications. Skull: No fracture or aggressive osseous lesion.  Sinuses/Orbits: No orbital mass or acute  orbital finding. Small mucous retention cyst within the left maxillary sinus. Review of the MIP images confirms the above findings CTA NECK FINDINGS Aortic arch: Standard aortic branching. Atherosclerotic plaque within the visualized aortic arch and proximal major branch vessels of the neck. No hemodynamically significant innominate or proximal subclavian artery stenosis. Right carotid system: CCA and ICA patent within the neck without hemodynamically significant stenosis (50% or greater). Atherosclerotic plaque, greatest about the carotid bifurcation and within the proximal ICA. Left carotid system: CCA and ICA patent within the neck. Atherosclerotic plaque, greatest about the carotid bifurcation and within the proximal ICA. Estimated 70% stenosis within the proximal ICA. Vertebral arteries: Vertebral arteries patent within the neck. Severe atherosclerotic stenoses at the origins of both vessels. Skeleton: Cervical spondylosis with multilevel disc space narrowing, disc bulges/central disc protrusions, posterior disc osteophyte complexes, uncovertebral hypertrophy and facet arthrosis. Multilevel spinal canal stenosis. Most notably, a posterior disc osteophyte complex contributes to severe spinal canal stenosis at C5-C6. Multilevel bony neural foraminal narrowing. Bulky multilevel ventral osteophytes. No acute fracture or aggressive osseous lesion. Other neck: No neck mass or cervical lymphadenopathy. Upper chest: Separately reported on concurrently performed CT chest/abdomen/pelvis. Review of the MIP images confirms the above findings CTA HEAD FINDINGS Anterior circulation: An ICA atherosclerotic plaque within both vessels. Up to moderate stenosis within the right cavernous segment. No more than mild stenosis of the intracranial left ICA. The M1 middle cerebral arteries are patent. No M2 proximal branch occlusion or high-grade proximal stenosis. The anterior cerebral arteries  are patent. No intracranial aneurysm is identified. Posterior circulation: The intracranial vertebral arteries are patent. The basilar artery is patent. The posterior cerebral arteries are patent. Hypoplastic P1 segments with sizable posterior communicating arteries, bilaterally. Venous sinuses: Within the limitations of contrast timing, no convincing thrombus. Anatomic variants: As described. Review of the MIP images confirms the above findings IMPRESSION: CT head: No evidence of acute intracranial abnormality. CTA neck: 1. The common carotid and internal carotid arteries are patent within the neck. Atherosclerotic plaque, bilaterally. Most notably, there is an estimated 70% atherosclerotic stenosis within the proximal left ICA. 2. Vertebral arteries patent within the neck. Severe atherosclerotic narrowing at the origin of both vessels. 3.  Aortic Atherosclerosis (ICD10-I70.0). 4. Cervical spondylosis, as described. Most notably, severe spinal canal stenosis is present at C5-C6. CTA head: 1. No intracranial large vessel occlusion or proximal high-grade arterial stenosis identified. 2. Atherosclerotic plaque within the intracranial ICAs. Most notably, there is up to moderate atherosclerotic narrowing within the right cavernous segment. Electronically Signed   By: Kellie Simmering D.O.   On: 12/22/2021 13:37   CT Angio Chest/Abd/Pel for Dissection W and/or Wo Contrast  Result Date: 12/22/2021 CLINICAL DATA:  Chest pain, altered mental status history of lung cancer * Tracking Code: BO * EXAM: CT ANGIOGRAPHY CHEST, ABDOMEN AND PELVIS TECHNIQUE: Non-contrast CT of the chest was initially obtained. Multidetector CT imaging through the chest, abdomen and pelvis was performed using the standard protocol during bolus administration of intravenous contrast. Multiplanar reconstructed images and MIPs were obtained and reviewed to evaluate the vascular anatomy. RADIATION DOSE REDUCTION: This exam was performed according to the  departmental dose-optimization program which includes automated exposure control, adjustment of the mA and/or kV according to patient size and/or use of iterative reconstruction technique. CONTRAST:  125mL OMNIPAQUE IOHEXOL 350 MG/ML SOLN COMPARISON:  CT chest, 08/22/2021, CT abdomen pelvis, 11/13/2020 FINDINGS: CTA CHEST FINDINGS VASCULAR Aorta: Satisfactory opacification of the aorta. Normal contour and caliber of the thoracic aorta.  No evidence of aneurysm, dissection, or other acute aortic pathology. Mild mixed aortic atherosclerosis. Cardiovascular: Left chest port catheter. No evidence of pulmonary embolism on limited non-tailored examination. Normal heart size. No pericardial effusion. Review of the MIP images confirms the above findings. NON VASCULAR Mediastinum/Nodes: No enlarged mediastinal, hilar, or axillary lymph nodes. Thyroid gland, trachea, and esophagus demonstrate no significant findings. Lungs/Pleura: Unchanged post treatment appearance of the perihilar right lung with bandlike scarring and fibrosis. Multiple small bilateral pulmonary nodules are unchanged, for example a 0.4 cm pulmonary nodule of the peripheral right lower lobe (series 6, image 45) and a 0.3 cm nodule of the medial left upper lobe (series 6, image 26). Mild, predominantly paraseptal emphysema. No pleural effusion or pneumothorax. Musculoskeletal: No chest wall abnormality. No acute osseous findings. Review of the MIP images confirms the above findings. CTA ABDOMEN AND PELVIS FINDINGS VASCULAR Normal contour and caliber of the abdominal aorta. No evidence of aneurysm, dissection, or other acute aortic pathology. Standard branching pattern of the abdominal aorta with solitary bilateral renal arteries. Severe mixed calcific atherosclerosis. Review of the MIP images confirms the above findings. NON-VASCULAR Hepatobiliary: No solid liver abnormality is seen. No gallstones, gallbladder wall thickening, or biliary dilatation. Pancreas:  Unremarkable. No pancreatic ductal dilatation or surrounding inflammatory changes. Spleen: Normal in size without significant abnormality. Adrenals/Urinary Tract: Adrenal glands are unremarkable. Kidneys are normal, without renal calculi, solid lesion, or hydronephrosis. Bladder is unremarkable. Stomach/Bowel: Stomach is within normal limits. Appendix appears normal. No evidence of bowel wall thickening, distention, or inflammatory changes. Lymphatic: No enlarged abdominal or pelvic lymph nodes. Reproductive: No mass or other significant abnormality. Other: No abdominal wall hernia or abnormality. No ascites. Musculoskeletal: No acute osseous findings. IMPRESSION: 1. Normal contour and caliber of the thoracic and abdominal aorta. No evidence of aneurysm, dissection, or other acute aortic pathology. Mixed aortic atherosclerosis. 2. Unchanged post treatment appearance of the perihilar right lung with bandlike scarring and fibrosis. 3. Multiple small bilateral pulmonary nodules are unchanged. Attention on follow-up. 4. No evidence of metastatic disease in the abdomen or pelvis. Aortic Atherosclerosis (ICD10-I70.0) and Emphysema (ICD10-J43.9). Electronically Signed   By: Delanna Ahmadi M.D.   On: 12/22/2021 13:24    Procedures Procedures    Medications Ordered in ED Medications  clevidipine (CLEVIPREX) infusion 0.5 mg/mL (has no administration in time range)  HYDROmorphone (DILAUDID) injection 0.5 mg (0.5 mg Intravenous Given 12/22/21 1321)  ondansetron (ZOFRAN) injection 4 mg (4 mg Intravenous Given 12/22/21 1321)  iohexol (OMNIPAQUE) 350 MG/ML injection 150 mL (100 mLs Intravenous Contrast Given 12/22/21 1252)  tenecteplase (TNKASE) injection for Stroke 22 mg (22 mg Intravenous Given 12/22/21 1415)  sodium chloride flush (NS) 0.9 % injection 10 mL (10 mLs Intravenous Given 12/22/21 1415)  sodium chloride flush (NS) 0.9 % injection 10 mL (10 mLs Intravenous Given 12/22/21 1415)    ED Course/ Medical Decision  Making/ A&P                           Medical Decision Making Amount and/or Complexity of Data Reviewed Labs: ordered. Radiology: ordered.  Risk Prescription drug management. Decision regarding hospitalization.   66 yo M with a chief complaints of sudden onset chest pain headache and neck pain.  This occurred about an hour ago.  Patient's presentation is somewhat concerning for an acute aortic dissection, he also has an acute neurologic deficit.  We will hold off on making him a code stroke, until I image  the aorta.  Will take urgently to CT and forego lab work.  His most recent lab test creatinine was less than 2.  He had a recent blood pressure medication change.  Otherwise denies change in medications.  CT angiogram of the chest is negative for acute aortic dissection.  Patient reassessed still with left lower extremity weakness and right-sided facial droop.  Will activate as a code stroke as he is still in the window.  Patient was evaluated by neurology given TNK.  Admit to the neuro ICU.  CRITICAL CARE Performed by: Cecilio Asper   Total critical care time: 35 minutes  Critical care time was exclusive of separately billable procedures and treating other patients.  Critical care was necessary to treat or prevent imminent or life-threatening deterioration.  Critical care was time spent personally by me on the following activities: development of treatment plan with patient and/or surrogate as well as nursing, discussions with consultants, evaluation of patient's response to treatment, examination of patient, obtaining history from patient or surrogate, ordering and performing treatments and interventions, ordering and review of laboratory studies, ordering and review of radiographic studies, pulse oximetry and re-evaluation of patient's condition.  The patients results and plan were reviewed and discussed.   Any x-rays performed were independently reviewed by myself.    Differential diagnosis were considered with the presenting HPI.  Medications  clevidipine (CLEVIPREX) infusion 0.5 mg/mL (has no administration in time range)  HYDROmorphone (DILAUDID) injection 0.5 mg (0.5 mg Intravenous Given 12/22/21 1321)  ondansetron (ZOFRAN) injection 4 mg (4 mg Intravenous Given 12/22/21 1321)  iohexol (OMNIPAQUE) 350 MG/ML injection 150 mL (100 mLs Intravenous Contrast Given 12/22/21 1252)  tenecteplase (TNKASE) injection for Stroke 22 mg (22 mg Intravenous Given 12/22/21 1415)  sodium chloride flush (NS) 0.9 % injection 10 mL (10 mLs Intravenous Given 12/22/21 1415)  sodium chloride flush (NS) 0.9 % injection 10 mL (10 mLs Intravenous Given 12/22/21 1415)    Vitals:   12/22/21 1415 12/22/21 1430 12/22/21 1445 12/22/21 1500  BP: (!) 167/82 (!) 161/68 (!) 167/82 (!) 166/84  Pulse: 73 74 70 69  Resp: 20 17 18 20   Temp:      TempSrc:      SpO2: 95% 95% 98% 97%  Weight:      Height:        Final diagnoses:  Cerebrovascular accident (CVA) due to embolism of cerebral artery ([place])    Admission/ observation were discussed with the admitting physician, patient and/or family and they are comfortable with the plan.           Final Clinical Impression(s) / ED Diagnoses Final diagnoses:  Cerebrovascular accident (CVA) due to embolism of cerebral artery [facility])    Rx / DC Orders ED Discharge Orders     None         Deno Etienne, DO 12/22/21 1511

## 2021-12-22 NOTE — Consult Note (Signed)
NEUROLOGY TELECONSULTATION NOTE   Date of service: December 22, 2021 Patient Name: Travis [name] MRN:  [id number] DOB:  1955/12/23 Reason for consult: telestroke  Requesting Provider: Dr. Deno Etienne Consult Participants: myself, patient Location of the provider: [facility] Location of the patient: [facility]  This consult was provided via telemedicine with 2-way video and audio communication. The patient/family was informed that care would be provided in this way and agreed to receive care in this manner.   _ _ _   _ __   _ __ _ _  __ __   _ __   __ _  History of Present Illness   This a 66 year old gentleman with past medical history significant for lung cancer (last radiation or chemo treatments were in 2020), CKD, diabetes, hypertension who presents with acute onset of left-sided weakness.  He is totally independent at baseline with no deficits.  Last known well was 11:00 AM.  He is not on anticoagulation. NIHSS = 5. CTH NAICP. The risks, benefits, and alternatives to TNK were discussed with the patient including the risk of hemorrhage and patient gave informed consent to proceed with TNK. CTA no LVO therefore no intervention was indicated.  CT head:   No evidence of acute intracranial abnormality.   CTA neck:   1. The common carotid and internal carotid arteries are patent within the neck. Atherosclerotic plaque, bilaterally. Most notably, there is an estimated 70% atherosclerotic stenosis within the proximal left ICA. 2. Vertebral arteries patent within the neck. Severe atherosclerotic narrowing at the origin of both vessels. 3.  Aortic Atherosclerosis (ICD10-I70.0). 4. Cervical spondylosis, as described. Most notably, severe spinal canal stenosis is present at C5-C6.   CTA head:   1. No intracranial large vessel occlusion or proximal high-grade arterial stenosis identified. 2. Atherosclerotic plaque within the intracranial ICAs. Most notably, there is up to moderate  atherosclerotic narrowing within the right cavernous segment.  CNS imaging personally reviewed.   ROS   Per HPI; all other systems reviewed and are negative  Past History   The following was personally reviewed:  Past Medical History:  Diagnosis Date   Arthritis    Cancer ([place])    lung   Chronic kidney disease    Diabetes mellitus    TYPE II   Dyspnea    at times   Hypertension    Pneumonia    "years ago"   Past Surgical History:  Procedure Laterality Date   COLONOSCOPY W/ POLYPECTOMY     IR IMAGING GUIDED PORT INSERTION  05/05/2018   NASAL FRACTURE SURGERY     VIDEO BRONCHOSCOPY WITH ENDOBRONCHIAL ULTRASOUND Right 04/20/2018   Procedure: VIDEO BRONCHOSCOPY WITH ENDOBRONCHIAL ULTRASOUND;  Surgeon: Juanito Doom, MD;  Location: [facility] OR;  Service: Thoracic;  Laterality: Right;   Family History  Problem Relation Age of Onset   Leukemia Sister    Cancer Brother    Diabetes Mother    Social History   Socioeconomic History   Marital status: Single    Spouse name: Not on file   Number of children: Not on file   Years of education: Not on file   Highest education level: Not on file  Occupational History   Not on file  Tobacco Use   Smoking status: Former    Packs/day: 1.00    Years: 45.00    Total pack years: 45.00    Types: Cigarettes    Quit date: 03/31/2018    Years since quitting: 3.7   Smokeless tobacco:  Former    Types: Chew    Quit date: 03/25/1973  Vaping Use   Vaping Use: Never used  Substance and Sexual Activity   Alcohol use: Not Currently   Drug use: No   Sexual activity: Not on file  Other Topics Concern   Not on file  Social History Narrative   Not on file   Social Determinants of Health   Financial Resource Strain: Not on file  Food Insecurity: Not on file  Transportation Needs: No Transportation Needs (04/30/2018)   PRAPARE - Hydrologist (Medical): No    Lack of Transportation (Non-Medical): No  Physical  Activity: Not on file  Stress: Not on file  Social Connections: Not on file   No Known Allergies  Medications   (Not in a hospital admission)    No current facility-administered medications for this encounter.  Current Outpatient Medications:    albuterol (VENTOLIN HFA) 108 (90 Base) MCG/ACT inhaler, Inhale 2 puffs into the lungs every 6 (six) hours as needed for wheezing or shortness of breath., Disp: 1 Inhaler, Rfl: 5   amitriptyline (ELAVIL) 50 MG tablet, Take 50 mg by mouth at bedtime., Disp: , Rfl:    amLODipine (NORVASC) 5 MG tablet, Take 5 mg by mouth daily., Disp: , Rfl:    budesonide-formoterol (SYMBICORT) 160-4.5 MCG/ACT inhaler, Inhale 2 puffs into the lungs 2 (two) times daily., Disp: 1 Inhaler, Rfl: 12   DULoxetine (CYMBALTA) 30 MG capsule, Take 30 mg by mouth daily., Disp: , Rfl:    empagliflozin (JARDIANCE) 25 MG TABS tablet, Take 1 tablet (25 mg total) by mouth daily., Disp: 30 tablet, Rfl:    famotidine (PEPCID) 20 MG tablet, Take 20 mg by mouth daily., Disp: , Rfl:    gabapentin (NEURONTIN) 300 MG capsule, Take 2 capsules (600 mg total) by mouth 2 (two) times daily., Disp: 120 capsule, Rfl: 0   glimepiride (AMARYL) 2 MG tablet, Take 2 mg by mouth daily with breakfast. , Disp: , Rfl:    glucose blood test strip, 4x a day, Disp: 150 each, Rfl: 12   insulin aspart (NOVOLOG) 100 UNIT/ML FlexPen, Inject 6-16 Units into the skin See admin instructions. BS 180-200 add extra 2 units BS 201-250 add 5 units BS 251-300 add 7 units 301-350 add 10 units 351-400 add 12 units  bs >400 call PCP, Disp: , Rfl:    insulin glargine (LANTUS) 100 UNIT/ML Solostar Pen, Inject 35 Units into the skin 2 (two) times daily for 3 days, THEN 35 Units daily., Disp: 12.6 mL, Rfl: 0   Insulin Pen Needle (BD PEN NEEDLE NANO U/F) 32G X 4 MM MISC, 4x daily, Disp: 150 each, Rfl: 6   ketoconazole (NIZORAL) 2 % cream, Apply 1 application topically 2 (two) times daily as needed for irritation., Disp: , Rfl:     lidocaine (LIDODERM) 5 %, Place 1 patch onto the skin daily. Remove & Discard patch within 12 hours or as directed by MD, Disp: 30 patch, Rfl: 0   lidocaine (XYLOCAINE) 2 % solution, Patient: Mix 1part 2% viscous lidocaine, 1part H20. Swish & swallow 66mL of diluted mixture, 13min before meals and at bedtime, up to QID (Patient taking differently: Use as directed 15 mLs in the mouth or throat 4 (four) times daily as needed for mouth pain. Patient: Mix 1part 2% viscous lidocaine, 1part H20. Swish & swallow 76mL of diluted mixture, 50min before meals and at bedtime, up to QID), Disp: 100 mL, Rfl: 5  losartan (COZAAR) 25 MG tablet, Take 1 tablet (25 mg total) by mouth daily., Disp: , Rfl:    magnesium oxide (MAG-OX) 400 (241.3 Mg) MG tablet, Take 1 tablet by mouth 2 (two) times daily., Disp: , Rfl:    morphine (MSIR) 15 MG tablet, Take 15 mg by mouth every 8 (eight) hours as needed for pain. (Patient not taking: Reported on 10/07/2020), Disp: , Rfl:    ondansetron (ZOFRAN ODT) 4 MG disintegrating tablet, Take 1 tablet (4 mg total) by mouth every 8 (eight) hours as needed for nausea or vomiting. (Patient not taking: No sig reported), Disp: 20 tablet, Rfl: 0   pantoprazole (PROTONIX) 40 MG tablet, Take 40 mg by mouth daily., Disp: , Rfl:    polyethylene glycol (MIRALAX / GLYCOLAX) 17 g packet, Take 17 g by mouth daily. (Patient not taking: No sig reported), Disp: 14 each, Rfl: 0   potassium chloride (KLOR-CON) 10 MEQ tablet, Take 1 tablet (10 mEq total) by mouth daily., Disp: , Rfl:    rosuvastatin (CRESTOR) 10 MG tablet, Take 10 mg by mouth daily., Disp: , Rfl:    tizanidine (ZANAFLEX) 2 MG capsule, Take 1 capsule (2 mg total) by mouth 3 (three) times daily as needed for muscle spasms., Disp: 24 capsule, Rfl: 0   torsemide (DEMADEX) 20 MG tablet, Take 1 tablet (20 mg total) by mouth 2 (two) times daily., Disp: , Rfl:    traMADol (ULTRAM) 50 MG tablet, Take 1 tablet (50 mg total) by mouth every 8 (eight)  hours as needed for moderate pain., Disp: 60 tablet, Rfl: 1   TRELEGY ELLIPTA 100-62.5-25 MCG/INH AEPB, Inhale 1 puff into the lungs daily., Disp: , Rfl:    Vitamin D, Ergocalciferol, (DRISDOL) 1.25 MG (50000 UNIT) CAPS capsule, Take 50,000 Units by mouth once a week., Disp: , Rfl:   Facility-Administered Medications Ordered in Other Encounters:    magnesium sulfate 2 g in sodium chloride 0.9 % 250 mL, 2 g, Intravenous, Once, Tish Men, MD  Vitals   Vitals:   12/22/21 1315 12/22/21 1330 12/22/21 1347 12/22/21 1349  BP: (!) 146/61 (!) 166/79  (!) 158/112  Pulse: 81 76  74  Resp: 19 16  19   Temp:      TempSrc:      SpO2: 97% 95%  97%  Weight:   88.4 kg   Height:         Body mass index is 27.96 kg/m.  Physical Exam   Exam performed over telemedicine with 2-way video and audio communication and with assistance of bedside RN  Physical Exam Gen: A&O x4, NAD Resp: normal WOB CV: extremities appear well-perfused  Neuro: *MS: A&O x4. Follows multi-step commands.  *Speech: mild dysarthria, no aphasia, able to name and repeat *CN: PERRL 19mm, EOMI, VFF by confrontation, L side numb, L NLF flattening, hearing intact to voice *Motor:   Normal bulk.  No tremor, rigidity or bradykinesia. No drift RUE or RLE. Drift in LUE and LLE, more severe in LLE, but neither drift to bed. *Sensory: Sensation impaired to LT on L. No double-simultaneous extinction.  *Coordination:  FNF intact bilat *Reflexes:  UTA 2/2 tele-exam *Gait: deferred  NIHSS  1a Level of Conscious.: 0 1b LOC Questions: 0 1c LOC Commands: 0 2 Best Gaze: 0 3 Visual: 0 4 Facial Palsy: 1 5a Motor Arm - left: 1 5b Motor Arm - Right: 0 6a Motor Leg - Left: 1 (pronounced but does not drift quite to bed) 6b Motor Leg -  Right: 0 7 Limb Ataxia: 0 8 Sensory: 1 9 Best Language: 0 10 Dysarthria: 1 11 Extinct. and Inatten.: 0  TOTAL: 5   Premorbid mRS = 0    Labs   CBC:  Recent Labs  Lab 12/22/21 1241  WBC 8.4   NEUTROABS 6.3  HGB 11.1*  HCT 33.6*  MCV 90.3  PLT 034    Basic Metabolic Panel:  Lab Results  Component Value Date   NA 138 12/22/2021   K 4.0 12/22/2021   CO2 24 12/22/2021   GLUCOSE 297 (H) 12/22/2021   BUN 22 12/22/2021   CREATININE 1.66 (H) 12/22/2021   CALCIUM 8.4 (L) 12/22/2021   GFRNONAA 46 (L) 12/22/2021   GFRAA >60 10/08/2019   Lipid Panel: No results found for: "LDLCALC" HgbA1c:  Lab Results  Component Value Date   HGBA1C 7.2 (H) 10/08/2020   Urine Drug Screen:     Component Value Date/Time   LABOPIA NONE DETECTED 12/22/2021 1326   COCAINSCRNUR NONE DETECTED 12/22/2021 1326   LABBENZ NONE DETECTED 12/22/2021 1326   AMPHETMU NONE DETECTED 12/22/2021 1326   THCU NONE DETECTED 12/22/2021 1326   LABBARB NONE DETECTED 12/22/2021 1326    Alcohol Level No results found for: "ETH"   Impression   This a 66 year old gentleman with past medical history significant for lung cancer (last radiation or chemo treatments were in 2020), CKD, diabetes, hypertension who presents with acute onset of left-sided weakness and numbness at 1100 today c/f acute ischemic stroke, now s/p TNK. CTA no LVO therefore no intervention was indicated.  Recommendations   - Admit to [facility] ICU under Dr. Donnetta Simpers; pls notify Dr. Lorrin Goodell when patient arrives - Neurochecks and NIHSS documentation per post-tNK protocol - STAT head CT for any change in neurologic exam - Non-con head CT 24 hrs post-tNK r/o hemorrhagic conversion - MRI brain wo contrast after arrival to [facility] - TTE at [facility] - no aspirin for 24 hours post IV t-PA and until ICH ruled out by a head CT - keep SBP less than 180/105 for the 1st 24 hours post IV t-NK to reduce the risk of hemorrhagic transformation - SCDs for DVT prophylaxis; can start SQ Heparin if head CT 24 hours post IV tPA is negative for ICH - NPO - PT/OT and speech therapy - stroke education - outpatient f/u with neurology after  discharge  Further mgmt by admitting neurohospitalist upon patient's arrival to [facility].  D/w Dr. Deno Etienne EDP and Dr. Annice Pih by phone.  This patient is critically ill and at significant risk of neurological worsening, death and care requires constant monitoring of vital signs, hemodynamics,respiratory and cardiac monitoring, neurological assessment, discussion with family, other specialists and medical decision making of high complexity. I spent 70 minutes of neurocritical care time  in the care of  this patient. This was time spent independent of any time provided by nurse practitioner or PA.  Su Monks, MD [facility] [phone]  If 7pm- 7am, please page neurology on call as listed in [place].

## 2021-12-22 NOTE — ED Notes (Signed)
Pt does not meet criteria for cleviprex at this time.

## 2021-12-22 NOTE — ED Notes (Signed)
Last known well time 1100 today. Tele Neuro at bedside

## 2021-12-22 NOTE — Progress Notes (Signed)
Code Stroke activated @ 7622.  NCCT/CTA completed and resulted at activation time.  Dr. Quinn Axe on camera @ 1350.  TNK given @ 1415.  TSRN on camera until 1445.  Jarold Song BSN, Horticulturist, commercial

## 2021-12-22 NOTE — ED Notes (Signed)
Pt said he started having L side headache and chest pain that started this am.

## 2021-12-22 NOTE — ED Notes (Signed)
[facility] at bedside 

## 2021-12-22 NOTE — ED Notes (Signed)
CBG was 277. Notified RN.

## 2021-12-22 NOTE — ED Notes (Signed)
1 normal saline flush given prior to TNK and 1 normal saline flush given after TNK

## 2021-12-22 NOTE — H&P (Signed)
NEUROLOGY CONSULTATION NOTE   Date of service: December 22, 2021 Patient Name: Travis [name] MRN:  [id number] DOB:  [birth year] _ _ _   _ __   _ __ _ _  __ __   _ __   __ _  History of Present Illness  Travis [name] is a 66 y.o. male with PMH significant for DM2, HTN, lung cancer s/p chemo radiation who presents with acute onset chest discomfort, LLE weakness and L facial droop. LKW 11AM. Symptoms started about an hours prior to arrival to [facility] ED.  He was activated as a code stroke and NIHSS of 5 for facial palst, LLE weakness, numbness and dysarthric speech.  CTH was negative for ICH or large infarct. CTA with no LVO. He was given tnkase and transferred to [facility] for further evaluation and post tnkase checks.  LKW: 1100 on 12/22/21. mRS: 0 tNKASE: given at [facility] ED Thrombectomy: not offered 2/2 no LVO NIHSS components Score: Comment  1a Level of Conscious 0[x]  1[]  2[]  3[]      1b LOC Questions 0[x]  1[]  2[]       1c LOC Commands 0[x]  1[]  2[]       2 Best Gaze 0[x]  1[]  2[]       3 Visual 0[x]  1[]  2[]  3[]      4 Facial Palsy 0[x]  1[]  2[]  3[]      5a Motor Arm - left 0[x]  1[]  2[]  3[]  4[]  UN[]    5b Motor Arm - Right 0[x]  1[]  2[]  3[]  4[]  UN[]    6a Motor Leg - Left 0[x]  1[]  2[]  3[]  4[]  UN[]    6b Motor Leg - Right 0[x]  1[]  2[]  3[]  4[]  UN[]    7 Limb Ataxia 0[x]  1[]  2[]  3[]  UN[]     8 Sensory 0[]  1[x]  2[]  UN[]      9 Best Language 0[x]  1[]  2[]  3[]      10 Dysarthria 0[]  1[x]  2[]  UN[]      11 Extinct. and Inattention 0[x]  1[]  2[]       TOTAL: 2      ROS   Constitutional Denies weight loss, fever and chills.   HEENT Denies changes in vision and hearing.   Respiratory Denies SOB and cough.   CV Denies palpitations and CP   GI Denies abdominal pain, nausea, vomiting and diarrhea.   GU Denies dysuria and urinary frequency.   MSK Denies myalgia and joint pain.   Skin Denies rash and pruritus.   Neurological Denies headache and syncope.   Psychiatric Denies recent changes in mood.  Denies anxiety and depression.    Past History   Past Medical History:  Diagnosis Date   Arthritis    Cancer ([place])    lung   Chronic kidney disease    Diabetes mellitus    TYPE II   Dyspnea    at times   Hypertension    Pneumonia    "years ago"   Past Surgical History:  Procedure Laterality Date   COLONOSCOPY W/ POLYPECTOMY     IR IMAGING GUIDED PORT INSERTION  05/05/2018   NASAL FRACTURE SURGERY     VIDEO BRONCHOSCOPY WITH ENDOBRONCHIAL ULTRASOUND Right 04/20/2018   Procedure: VIDEO BRONCHOSCOPY WITH ENDOBRONCHIAL ULTRASOUND;  Surgeon: Juanito Doom, MD;  Location: [facility] OR;  Service: Thoracic;  Laterality: Right;   Family History  Problem Relation Age of Onset   Leukemia Sister    Cancer Brother    Diabetes Mother    Social History   Socioeconomic History   Marital status: Single  Spouse name: Not on file   Number of children: Not on file   Years of education: Not on file   Highest education level: Not on file  Occupational History   Not on file  Tobacco Use   Smoking status: Former    Packs/day: 1.00    Years: 45.00    Total pack years: 45.00    Types: Cigarettes    Quit date: 03/31/2018    Years since quitting: 3.7   Smokeless tobacco: Former    Types: Chew    Quit date: 03/25/1973  Vaping Use   Vaping Use: Never used  Substance and Sexual Activity   Alcohol use: Not Currently   Drug use: No   Sexual activity: Not on file  Other Topics Concern   Not on file  Social History Narrative   Not on file   Social Determinants of Health   Financial Resource Strain: Not on file  Food Insecurity: Not on file  Transportation Needs: No Transportation Needs (04/30/2018)   PRAPARE - Hydrologist (Medical): No    Lack of Transportation (Non-Medical): No  Physical Activity: Not on file  Stress: Not on file  Social Connections: Not on file   No Known Allergies  Medications   Medications Prior to Admission  Medication Sig  Dispense Refill Last Dose   albuterol (VENTOLIN HFA) 108 (90 Base) MCG/ACT inhaler Inhale 2 puffs into the lungs every 6 (six) hours as needed for wheezing or shortness of breath. 1 Inhaler 5    amitriptyline (ELAVIL) 50 MG tablet Take 50 mg by mouth at bedtime.      amLODipine (NORVASC) 5 MG tablet Take 5 mg by mouth daily.      budesonide-formoterol (SYMBICORT) 160-4.5 MCG/ACT inhaler Inhale 2 puffs into the lungs 2 (two) times daily. 1 Inhaler 12    DULoxetine (CYMBALTA) 30 MG capsule Take 30 mg by mouth daily.      empagliflozin (JARDIANCE) 25 MG TABS tablet Take 1 tablet (25 mg total) by mouth daily. 30 tablet     famotidine (PEPCID) 20 MG tablet Take 20 mg by mouth daily.      gabapentin (NEURONTIN) 300 MG capsule Take 2 capsules (600 mg total) by mouth 2 (two) times daily. 120 capsule 0    glimepiride (AMARYL) 2 MG tablet Take 2 mg by mouth daily with breakfast.       glucose blood test strip 4x a day 150 each 12    insulin aspart (NOVOLOG) 100 UNIT/ML FlexPen Inject 6-16 Units into the skin See admin instructions. BS 180-200 add extra 2 units BS 201-250 add 5 units BS 251-300 add 7 units 301-350 add 10 units 351-400 add 12 units  bs >400 call PCP      insulin glargine (LANTUS) 100 UNIT/ML Solostar Pen Inject 35 Units into the skin 2 (two) times daily for 3 days, THEN 35 Units daily. 12.6 mL 0    Insulin Pen Needle (BD PEN NEEDLE NANO U/F) 32G X 4 MM MISC 4x daily 150 each 6    ketoconazole (NIZORAL) 2 % cream Apply 1 application topically 2 (two) times daily as needed for irritation.      lidocaine (LIDODERM) 5 % Place 1 patch onto the skin daily. Remove & Discard patch within 12 hours or as directed by MD 30 patch 0    lidocaine (XYLOCAINE) 2 % solution Patient: Mix 1part 2% viscous lidocaine, 1part H20. Swish & swallow 90mL of diluted mixture, 28min  before meals and at bedtime, up to QID (Patient taking differently: Use as directed 15 mLs in the mouth or throat 4 (four) times daily as  needed for mouth pain. Patient: Mix 1part 2% viscous lidocaine, 1part H20. Swish & swallow 57mL of diluted mixture, 48min before meals and at bedtime, up to QID) 100 mL 5    losartan (COZAAR) 25 MG tablet Take 1 tablet (25 mg total) by mouth daily.      magnesium oxide (MAG-OX) 400 (241.3 Mg) MG tablet Take 1 tablet by mouth 2 (two) times daily.      morphine (MSIR) 15 MG tablet Take 15 mg by mouth every 8 (eight) hours as needed for pain. (Patient not taking: Reported on 10/07/2020)      ondansetron (ZOFRAN ODT) 4 MG disintegrating tablet Take 1 tablet (4 mg total) by mouth every 8 (eight) hours as needed for nausea or vomiting. (Patient not taking: No sig reported) 20 tablet 0    pantoprazole (PROTONIX) 40 MG tablet Take 40 mg by mouth daily.      polyethylene glycol (MIRALAX / GLYCOLAX) 17 g packet Take 17 g by mouth daily. (Patient not taking: No sig reported) 14 each 0    potassium chloride (KLOR-CON) 10 MEQ tablet Take 1 tablet (10 mEq total) by mouth daily.      rosuvastatin (CRESTOR) 10 MG tablet Take 10 mg by mouth daily.      tizanidine (ZANAFLEX) 2 MG capsule Take 1 capsule (2 mg total) by mouth 3 (three) times daily as needed for muscle spasms. 24 capsule 0    torsemide (DEMADEX) 20 MG tablet Take 1 tablet (20 mg total) by mouth 2 (two) times daily.      traMADol (ULTRAM) 50 MG tablet Take 1 tablet (50 mg total) by mouth every 8 (eight) hours as needed for moderate pain. 60 tablet 1    TRELEGY ELLIPTA 100-62.5-25 MCG/INH AEPB Inhale 1 puff into the lungs daily.      Vitamin D, Ergocalciferol, (DRISDOL) 1.25 MG (50000 UNIT) CAPS capsule Take 50,000 Units by mouth once a week.        Vitals   Vitals:   12/22/21 1548 12/22/21 1600 12/22/21 1615 12/22/21 1630  BP:  (!) 163/79 136/74 (!) 150/68  Pulse:  66 65 65  Resp:  16 20 17   Temp: (!) 97.5 F (36.4 C)     TempSrc: Oral     SpO2:  100% 98% 96%  Weight:      Height:         Body mass index is 27.96 kg/m.  Physical Exam    General: Laying comfortably in bed; in no acute distress.  HENT: Normal oropharynx and mucosa. Normal external appearance of ears and nose.  Neck: Supple, no pain or tenderness CV: No JVD. No peripheral edema.  Pulmonary: Symmetric Chest rise. Normal respiratory effort.  Abdomen: Soft to touch, non-tender.  Ext: No cyanosis, edema, or deformity  Skin: No rash. Normal palpation of skin.   Musculoskeletal: Normal digits and nails by inspection. No clubbing.   Neurologic Examination  Mental status/Cognition: Alert, oriented to self, place, month and year, good attention.  Speech/language: mildly dysarthric speech, fluent, comprehension intact, object naming intact, repetition intact.  Cranial nerves:   CN II Pupils equal and reactive to light, no VF deficits    CN III,IV,VI EOM intact, no gaze preference or deviation, no nystagmus   CN V normal sensation in V1, V2, and V3 segments bilaterally  CN VII no asymmetry, no nasolabial fold flattening    CN VIII normal hearing to speech    CN IX & X normal palatal elevation, no uvular deviation    CN XI 5/5 head turn and 5/5 shoulder shrug bilaterally    CN XII midline tongue protrusion    Motor:  Muscle bulk: normal, tone normal, pronator drift mild LUE drift. Mvmt Root Nerve  Muscle Right Left Comments  SA C5/6 Ax Deltoid 5 5   EF C5/6 Mc Biceps 5 5   EE C6/7/8 Rad Triceps 5 4+   WF C6/7 Med FCR     WE C7/8 PIN ECU     F Ab C8/T1 U ADM/FDI 5 4+   HF L1/2/3 Fem Illopsoas 5 4+   KE L2/3/4 Fem Quad 5 4+   DF L4/5 D Peron Tib Ant 5 4+   PF S1/2 Tibial Grc/Sol 5 4+    Reflexes:  Right Left Comments  Pectoralis      Biceps (C5/6) 2 2   Brachioradialis (C5/6) 2 2    Triceps (C6/7) 2 2    Patellar (L3/4) 2 2    Achilles (S1)      Hoffman      Plantar     Jaw jerk    Sensation:  Light touch Mildly decreased in L face and LLE   Pin prick    Temperature    Vibration   Proprioception    Coordination/Complex Motor:  - Finger  to Nose intact BL - Heel to shin ataxia in LLE - Rapid alternating movement are slowed on the left mildly - Gait: deferred for patient safety.  Labs   CBC:  Recent Labs  Lab 12/22/21 1241  WBC 8.4  NEUTROABS 6.3  HGB 11.1*  HCT 33.6*  MCV 90.3  PLT 829    Basic Metabolic Panel:  Lab Results  Component Value Date   NA 138 12/22/2021   K 4.0 12/22/2021   CO2 24 12/22/2021   GLUCOSE 297 (H) 12/22/2021   BUN 22 12/22/2021   CREATININE 1.66 (H) 12/22/2021   CALCIUM 8.4 (L) 12/22/2021   GFRNONAA 46 (L) 12/22/2021   GFRAA >60 10/08/2019   Lipid Panel: No results found for: "LDLCALC" HgbA1c:  Lab Results  Component Value Date   HGBA1C 7.2 (H) 10/08/2020   Urine Drug Screen:     Component Value Date/Time   LABOPIA NONE DETECTED 12/22/2021 1326   COCAINSCRNUR NONE DETECTED 12/22/2021 1326   LABBENZ NONE DETECTED 12/22/2021 1326   AMPHETMU NONE DETECTED 12/22/2021 1326   THCU NONE DETECTED 12/22/2021 1326   LABBARB NONE DETECTED 12/22/2021 1326    Alcohol Level No results found for: "ETH"  CT Head without contrast(Personally reviewed): CTH was negative for a large hypodensity concerning for a large territory infarct or hyperdensity concerning for an ICH  CT angio Head and Neck with contrast(Personally reviewed): BL proximal ICA stenosis, L worse than R. No significant ICAD.  MRI Brain: pending   Impression   Travis [name] is a pleasant 66 y.o. male with PMH significant for DM2, HTN, lung cancer s/p chemo radiation who presents with acute onset chest discomfort, LLE weakness and L facial droop. He was given tnkase for NIHSS of 5. Not a candidate for thrombectomy 2/2 no LVO.  Symptoms are highly concerning for a small stroke, likely a small vessel.  Primary Diagnosis:  Cerebral infarction, unspecified.  Secondary Diagnosis: Essential (primary) hypertension, Type 2 diabetes mellitus with hyperglycemia , CKD Stage 3 (  GFR 30-59), and  Hypocalcemia  Recommendations   Stroke determined by clinical assessment with left sided weakness s/p tnkase: - Frequent NeuroChecks for post tPA care per stroke unit protocol: - Initial CTH demonstrated no acute hemorrhage or mass - MRI Brain - pending - CTA - no LVO, BL proximal ICA stenosis with left worse than right - TTE - pending - Lipid Panel: LDL - pending  - Statin: continue home statin - HbA1c: pending. - Antithrombotic: Start ASA 81 mg daily if 24 h CTH does not show acute hemorrhage - DVT prophylaxis: SCDs. Pharmacologic prophylaxis if 24 h CTH does not demonstrate acute hemorrhage - Systolic Blood Pressure goal: < 180 mm Hg - Telemetry monitoring for arrhythmia: 72 hours - Swallow screen - ordered - PT/OT/SLP consults  HTN: Goal BP as above. Hold home meds. PRN labetalol for SBP above goal. If it continues to stay elevated despite Labetalol, will do Cleviprex.  DM2 with polyneuropathy: HbA1c is pending Hold home meds Sliding scale insulin with carb correction Carb modified diet Continue home gabapentin  HLD: LDL pending - continue home statin in the meantime.   GERD: - continue home PPI.  COPD: - continue home inhalers. - he does not smoke.  CKD stage 3A Avoid nephrotoxic meds Monitor creatinine  History of Lung cancer: Not on chemo anymore, in remission since 2020.  This patient is critically ill and at significant risk of neurological worsening, death and care requires constant monitoring of vital signs, hemodynamics,respiratory and cardiac monitoring, neurological assessment, discussion with family, other specialists and medical decision making of high complexity. I spent 35 minutes of neurocritical care time  in the care of  this patient. This was time spent independent of any time provided by nurse practitioner or PA.  [place] Pager Number [phone] 12/22/2021  6:20  PM   ______________________________________________________________________   Thank you for the opportunity to take part in the care of this patient. If you have any further questions, please contact the neurology consultation attending.  Signed,  [place] Pager Number [phone] _ _ _   _ __   _ __ _ _  __ __   _ __   __ _

## 2021-12-22 NOTE — ED Triage Notes (Addendum)
Patient brought in by family with c/o chest pain, altered mental status and weakness onset today.

## 2021-12-23 ENCOUNTER — Inpatient Hospital Stay (HOSPITAL_COMMUNITY): Payer: Medicare PPO

## 2021-12-23 DIAGNOSIS — N179 Acute kidney failure, unspecified: Secondary | ICD-10-CM

## 2021-12-23 DIAGNOSIS — I6389 Other cerebral infarction: Secondary | ICD-10-CM

## 2021-12-23 DIAGNOSIS — I6340 Cerebral infarction due to embolism of unspecified cerebral artery: Secondary | ICD-10-CM | POA: Diagnosis not present

## 2021-12-23 LAB — ECHOCARDIOGRAM COMPLETE BUBBLE STUDY
AR max vel: 1.46 cm2
AV Area VTI: 1.52 cm2
AV Area mean vel: 1.27 cm2
AV Mean grad: 11 mmHg
AV Peak grad: 18 mmHg
Ao pk vel: 2.12 m/s
Area-P 1/2: 3.99 cm2
S' Lateral: 2.5 cm

## 2021-12-23 LAB — BASIC METABOLIC PANEL
Anion gap: 8 (ref 5–15)
BUN: 23 mg/dL (ref 8–23)
CO2: 26 mmol/L (ref 22–32)
Calcium: 8.5 mg/dL — ABNORMAL LOW (ref 8.9–10.3)
Chloride: 107 mmol/L (ref 98–111)
Creatinine, Ser: 2.17 mg/dL — ABNORMAL HIGH (ref 0.61–1.24)
GFR, Estimated: 33 mL/min — ABNORMAL LOW (ref >60)
Glucose, Bld: 219 mg/dL — ABNORMAL HIGH (ref 70–99)
Potassium: 4.4 mmol/L (ref 3.5–5.1)
Sodium: 141 mmol/L (ref 135–145)

## 2021-12-23 LAB — CBC
HCT: 32.9 % — ABNORMAL LOW (ref 39.0–52.0)
Hemoglobin: 10.5 g/dL — ABNORMAL LOW (ref 13.0–17.0)
MCH: 29.7 pg (ref 26.0–34.0)
MCHC: 31.9 g/dL (ref 30.0–36.0)
MCV: 93.2 fL (ref 80.0–100.0)
Platelets: 253 10*3/uL (ref 150–400)
RBC: 3.53 MIL/uL — ABNORMAL LOW (ref 4.22–5.81)
RDW: 12.6 % (ref 11.5–15.5)
WBC: 5.4 10*3/uL (ref 4.0–10.5)
nRBC: 0 % (ref 0.0–0.2)

## 2021-12-23 LAB — GLUCOSE, CAPILLARY
Glucose-Capillary: 142 mg/dL — ABNORMAL HIGH (ref 70–99)
Glucose-Capillary: 165 mg/dL — ABNORMAL HIGH (ref 70–99)
Glucose-Capillary: 220 mg/dL — ABNORMAL HIGH (ref 70–99)
Glucose-Capillary: 201 mg/dL — ABNORMAL HIGH (ref 70–99)

## 2021-12-23 LAB — HEMOGLOBIN A1C
Hgb A1c MFr Bld: 7.2 % — ABNORMAL HIGH (ref 4.8–5.6)
Mean Plasma Glucose: 159.94 mg/dL

## 2021-12-23 LAB — HIV ANTIBODY (ROUTINE TESTING W REFLEX): HIV Screen 4th Generation wRfx: NONREACTIVE

## 2021-12-23 MED ORDER — CHLORHEXIDINE GLUCONATE CLOTH 2 % EX PADS
6.0000 | MEDICATED_PAD | Freq: Every day | CUTANEOUS | Status: DC
  Administered 2021-12-23: 6 via TOPICAL

## 2021-12-23 MED ORDER — SODIUM CHLORIDE 0.9 % IV SOLN: INTRAVENOUS | Status: DC

## 2021-12-23 MED ORDER — ASPIRIN 81 MG PO TBEC
81.0000 mg | DELAYED_RELEASE_TABLET | Freq: Every day | ORAL | Status: DC
  Administered 2021-12-23 – 2021-12-24 (×2): 81 mg via ORAL
  Filled 2021-12-23 (×2): qty 1

## 2021-12-23 MED ORDER — CLOPIDOGREL BISULFATE 75 MG PO TABS
75.0000 mg | ORAL_TABLET | Freq: Every day | ORAL | Status: DC
  Administered 2021-12-23 – 2021-12-24 (×2): 75 mg via ORAL
  Filled 2021-12-23 (×2): qty 1

## 2021-12-23 MED ORDER — LABETALOL HCL 5 MG/ML IV SOLN: 5.0000 mg | INTRAVENOUS | Status: DC | PRN

## 2021-12-23 MED ORDER — SODIUM CHLORIDE 0.9% FLUSH: 10.0000 mL | INTRAVENOUS | Status: DC | PRN

## 2021-12-23 MED ORDER — ROSUVASTATIN CALCIUM 20 MG PO TABS
20.0000 mg | ORAL_TABLET | Freq: Every day | ORAL | Status: DC
  Administered 2021-12-24: 20 mg via ORAL
  Filled 2021-12-23: qty 1

## 2021-12-23 NOTE — Progress Notes (Signed)
PT Cancellation Note  Patient Details Name: Travis [name] MRN: [id number] DOB: 12-02-1955   Cancelled Treatment:    Reason Eval/Treat Not Completed: Active bedrest order. Pt on strict bedrest post TPA. Will eval once off of bedrest.   Shary Decamp [facility] 12/23/2021, 8:04 AM [place] Office [phone]

## 2021-12-23 NOTE — Progress Notes (Signed)
  Echocardiogram 2D Echocardiogram has been performed.  Travis [name] 12/23/2021, 3:11 PM

## 2021-12-23 NOTE — Plan of Care (Signed)
  Problem: Education: Goal: Knowledge of General Education information will improve Description: Including pain rating scale, medication(s)/side effects and non-pharmacologic comfort measures Outcome: Progressing   Problem: Activity: Goal: Risk for activity intolerance will decrease Outcome: Progressing   Problem: Nutrition: Goal: Adequate nutrition will be maintained Outcome: Progressing   Problem: Coping: Goal: Level of anxiety will decrease Outcome: Progressing   Problem: Elimination: Goal: Will not experience complications related to bowel motility Outcome: Progressing Goal: Will not experience complications related to urinary retention Outcome: Progressing   Problem: Pain Managment: Goal: General experience of comfort will improve Outcome: Progressing   Problem: Safety: Goal: Ability to remain free from injury will improve Outcome: Progressing   Problem: Skin Integrity: Goal: Risk for impaired skin integrity will decrease Outcome: Progressing   Problem: Health Behavior/Discharge Planning: Goal: Ability to manage health-related needs will improve Outcome: Progressing   Problem: Nutritional: Goal: Maintenance of adequate nutrition will improve Outcome: Progressing   Problem: Skin Integrity: Goal: Risk for impaired skin integrity will decrease Outcome: Progressing   Problem: Tissue Perfusion: Goal: Adequacy of tissue perfusion will improve Outcome: Progressing

## 2021-12-23 NOTE — Progress Notes (Addendum)
STROKE TEAM PROGRESS NOTE   SUBJECTIVE (INTERVAL HISTORY) His RN is at the bedside.  Overall his condition is rapidly improving. He stated that he chest pain yesterday and spread to his left neck, left head, left shoulder. Then he had left arm and leg weakness but now he felt his is nearly back to baseline. Pending MRI this pm.    OBJECTIVE Temp:  [96.2 F (35.7 C)-97.7 F (36.5 C)] 97.4 F (36.3 C) (10/01 1104) Pulse Rate:  [60-81] 66 (10/01 1000) Cardiac Rhythm: Normal sinus rhythm (10/01 0800) Resp:  [13-26] 13 (10/01 1000) BP: (120-176)/(61-112) 136/74 (10/01 1000) SpO2:  [90 %-100 %] 90 % (10/01 1000) Weight:  [86.2 kg-88.4 kg] 88.4 kg (09/30 1347)  Recent Labs  Lab 12/22/21 1212 12/22/21 1810 12/22/21 2113 12/23/21 0747  GLUCAP 277* 181* 248* 142*   Recent Labs  Lab 12/22/21 1241 12/23/21 0902  NA 138 141  K 4.0 4.4  CL 107 107  CO2 24 26  GLUCOSE 297* 219*  BUN 22 23  CREATININE 1.66* 2.17*  CALCIUM 8.4* 8.5*   Recent Labs  Lab 12/22/21 1241  AST 16  ALT 23  ALKPHOS 80  BILITOT 0.4  PROT 6.4*  ALBUMIN 3.0*   Recent Labs  Lab 12/22/21 1241 12/23/21 0902  WBC 8.4 5.4  NEUTROABS 6.3  --   HGB 11.1* 10.5*  HCT 33.6* 32.9*  MCV 90.3 93.2  PLT 274 253   No results for input(s): "CKTOTAL", "CKMB", "CKMBINDEX", "TROPONINI" in the last 168 hours. No results for input(s): "LABPROT", "INR" in the last 72 hours. Recent Labs    12/22/21 1326  COLORURINE YELLOW  LABSPEC 1.015  PHURINE 5.5  GLUCOSEU >=500*  HGBUR NEGATIVE  BILIRUBINUR NEGATIVE  KETONESUR NEGATIVE  PROTEINUR 100*  NITRITE NEGATIVE  LEUKOCYTESUR NEGATIVE    No results found for: "CHOL", "TRIG", "HDL", "CHOLHDL", "VLDL", "LDLCALC" Lab Results  Component Value Date   HGBA1C 7.2 (H) 12/23/2021      Component Value Date/Time   LABOPIA NONE DETECTED 12/22/2021 1326   COCAINSCRNUR NONE DETECTED 12/22/2021 1326   LABBENZ NONE DETECTED 12/22/2021 1326   AMPHETMU NONE DETECTED  12/22/2021 1326   THCU NONE DETECTED 12/22/2021 1326   LABBARB NONE DETECTED 12/22/2021 1326    No results for input(s): "ETH" in the last 168 hours.  I have personally reviewed the radiological images below and agree with the radiology interpretations.  CT ANGIO HEAD NECK W WO CM  Result Date: 12/22/2021 CLINICAL DATA:  Provided history: Neuro deficit, acute, stroke suspected. EXAM: CT ANGIOGRAPHY HEAD AND NECK TECHNIQUE: Multidetector CT imaging of the head and neck was performed using the standard protocol during bolus administration of intravenous contrast. Multiplanar CT image reconstructions and MIPs were obtained to evaluate the vascular anatomy. Carotid stenosis measurements (when applicable) are obtained utilizing NASCET criteria, using the distal internal carotid diameter as the denominator. RADIATION DOSE REDUCTION: This exam was performed according to the departmental dose-optimization program which includes automated exposure control, adjustment of the mA and/or kV according to patient size and/or use of iterative reconstruction technique. CONTRAST:  136mL OMNIPAQUE IOHEXOL 350 MG/ML SOLN COMPARISON:  Head CT 10/21/2021. FINDINGS: CT HEAD FINDINGS Brain: No age advanced or lobar predominant parenchymal atrophy. There is no acute intracranial hemorrhage. No demarcated cortical infarct. No extra-axial fluid collection. No evidence of an intracranial mass. No midline shift. Vascular: No hyperdense vessel.  Atherosclerotic calcifications. Skull: No fracture or aggressive osseous lesion. Sinuses/Orbits: No orbital mass or acute orbital finding. Small  mucous retention cyst within the left maxillary sinus. Review of the MIP images confirms the above findings CTA NECK FINDINGS Aortic arch: Standard aortic branching. Atherosclerotic plaque within the visualized aortic arch and proximal major branch vessels of the neck. No hemodynamically significant innominate or proximal subclavian artery stenosis.  Right carotid system: CCA and ICA patent within the neck without hemodynamically significant stenosis (50% or greater). Atherosclerotic plaque, greatest about the carotid bifurcation and within the proximal ICA. Left carotid system: CCA and ICA patent within the neck. Atherosclerotic plaque, greatest about the carotid bifurcation and within the proximal ICA. Estimated 70% stenosis within the proximal ICA. Vertebral arteries: Vertebral arteries patent within the neck. Severe atherosclerotic stenoses at the origins of both vessels. Skeleton: Cervical spondylosis with multilevel disc space narrowing, disc bulges/central disc protrusions, posterior disc osteophyte complexes, uncovertebral hypertrophy and facet arthrosis. Multilevel spinal canal stenosis. Most notably, a posterior disc osteophyte complex contributes to severe spinal canal stenosis at C5-C6. Multilevel bony neural foraminal narrowing. Bulky multilevel ventral osteophytes. No acute fracture or aggressive osseous lesion. Other neck: No neck mass or cervical lymphadenopathy. Upper chest: Separately reported on concurrently performed CT chest/abdomen/pelvis. Review of the MIP images confirms the above findings CTA HEAD FINDINGS Anterior circulation: An ICA atherosclerotic plaque within both vessels. Up to moderate stenosis within the right cavernous segment. No more than mild stenosis of the intracranial left ICA. The M1 middle cerebral arteries are patent. No M2 proximal branch occlusion or high-grade proximal stenosis. The anterior cerebral arteries are patent. No intracranial aneurysm is identified. Posterior circulation: The intracranial vertebral arteries are patent. The basilar artery is patent. The posterior cerebral arteries are patent. Hypoplastic P1 segments with sizable posterior communicating arteries, bilaterally. Venous sinuses: Within the limitations of contrast timing, no convincing thrombus. Anatomic variants: As described. Review of the MIP  images confirms the above findings IMPRESSION: CT head: No evidence of acute intracranial abnormality. CTA neck: 1. The common carotid and internal carotid arteries are patent within the neck. Atherosclerotic plaque, bilaterally. Most notably, there is an estimated 70% atherosclerotic stenosis within the proximal left ICA. 2. Vertebral arteries patent within the neck. Severe atherosclerotic narrowing at the origin of both vessels. 3.  Aortic Atherosclerosis (ICD10-I70.0). 4. Cervical spondylosis, as described. Most notably, severe spinal canal stenosis is present at C5-C6. CTA head: 1. No intracranial large vessel occlusion or proximal high-grade arterial stenosis identified. 2. Atherosclerotic plaque within the intracranial ICAs. Most notably, there is up to moderate atherosclerotic narrowing within the right cavernous segment. Electronically Signed   By: Kellie Simmering D.O.   On: 12/22/2021 13:37   CT Angio Chest/Abd/Pel for Dissection W and/or Wo Contrast  Result Date: 12/22/2021 CLINICAL DATA:  Chest pain, altered mental status history of lung cancer * Tracking Code: BO * EXAM: CT ANGIOGRAPHY CHEST, ABDOMEN AND PELVIS TECHNIQUE: Non-contrast CT of the chest was initially obtained. Multidetector CT imaging through the chest, abdomen and pelvis was performed using the standard protocol during bolus administration of intravenous contrast. Multiplanar reconstructed images and MIPs were obtained and reviewed to evaluate the vascular anatomy. RADIATION DOSE REDUCTION: This exam was performed according to the departmental dose-optimization program which includes automated exposure control, adjustment of the mA and/or kV according to patient size and/or use of iterative reconstruction technique. CONTRAST:  123mL OMNIPAQUE IOHEXOL 350 MG/ML SOLN COMPARISON:  CT chest, 08/22/2021, CT abdomen pelvis, 11/13/2020 FINDINGS: CTA CHEST FINDINGS VASCULAR Aorta: Satisfactory opacification of the aorta. Normal contour and  caliber of the thoracic aorta. No evidence  of aneurysm, dissection, or other acute aortic pathology. Mild mixed aortic atherosclerosis. Cardiovascular: Left chest port catheter. No evidence of pulmonary embolism on limited non-tailored examination. Normal heart size. No pericardial effusion. Review of the MIP images confirms the above findings. NON VASCULAR Mediastinum/Nodes: No enlarged mediastinal, hilar, or axillary lymph nodes. Thyroid gland, trachea, and esophagus demonstrate no significant findings. Lungs/Pleura: Unchanged post treatment appearance of the perihilar right lung with bandlike scarring and fibrosis. Multiple small bilateral pulmonary nodules are unchanged, for example a 0.4 cm pulmonary nodule of the peripheral right lower lobe (series 6, image 45) and a 0.3 cm nodule of the medial left upper lobe (series 6, image 26). Mild, predominantly paraseptal emphysema. No pleural effusion or pneumothorax. Musculoskeletal: No chest wall abnormality. No acute osseous findings. Review of the MIP images confirms the above findings. CTA ABDOMEN AND PELVIS FINDINGS VASCULAR Normal contour and caliber of the abdominal aorta. No evidence of aneurysm, dissection, or other acute aortic pathology. Standard branching pattern of the abdominal aorta with solitary bilateral renal arteries. Severe mixed calcific atherosclerosis. Review of the MIP images confirms the above findings. NON-VASCULAR Hepatobiliary: No solid liver abnormality is seen. No gallstones, gallbladder wall thickening, or biliary dilatation. Pancreas: Unremarkable. No pancreatic ductal dilatation or surrounding inflammatory changes. Spleen: Normal in size without significant abnormality. Adrenals/Urinary Tract: Adrenal glands are unremarkable. Kidneys are normal, without renal calculi, solid lesion, or hydronephrosis. Bladder is unremarkable. Stomach/Bowel: Stomach is within normal limits. Appendix appears normal. No evidence of bowel wall thickening,  distention, or inflammatory changes. Lymphatic: No enlarged abdominal or pelvic lymph nodes. Reproductive: No mass or other significant abnormality. Other: No abdominal wall hernia or abnormality. No ascites. Musculoskeletal: No acute osseous findings. IMPRESSION: 1. Normal contour and caliber of the thoracic and abdominal aorta. No evidence of aneurysm, dissection, or other acute aortic pathology. Mixed aortic atherosclerosis. 2. Unchanged post treatment appearance of the perihilar right lung with bandlike scarring and fibrosis. 3. Multiple small bilateral pulmonary nodules are unchanged. Attention on follow-up. 4. No evidence of metastatic disease in the abdomen or pelvis. Aortic Atherosclerosis (ICD10-I70.0) and Emphysema (ICD10-J43.9). Electronically Signed   By: Delanna Ahmadi M.D.   On: 12/22/2021 13:24     PHYSICAL EXAM  Temp:  [96.2 F (35.7 C)-97.7 F (36.5 C)] 97.4 F (36.3 C) (10/01 1104) Pulse Rate:  [60-81] 66 (10/01 1000) Resp:  [13-26] 13 (10/01 1000) BP: (120-176)/(61-112) 136/74 (10/01 1000) SpO2:  [90 %-100 %] 90 % (10/01 1000) Weight:  [86.2 kg-88.4 kg] 88.4 kg (09/30 1347)  General - Well nourished, well developed, in no apparent distress.  Ophthalmologic - fundi not visualized due to noncooperation.  Cardiovascular - Regular rhythm and rate.  Mental Status -  Level of arousal and orientation to time, place, and person were intact. Language including expression, naming, repetition, comprehension was assessed and found intact. Fund of Knowledge was assessed and was intact.  Cranial Nerves II - XII - II - Visual field intact OU. III, IV, VI - Extraocular movements intact. V - Facial sensation intact bilaterally. VII - Facial movement intact bilaterally. VIII - Hearing & vestibular intact bilaterally. X - Palate elevates symmetrically. XI - Chin turning & shoulder shrug intact bilaterally. XII - Tongue protrusion intact.  Motor Strength - The patient's strength was  normal in RUE and RLE, but LUE proximal 5-/5 bicep and tricep, distal hand grip 3+/5. LLE knee flexion  and distal ankle DF 4+/5. Bulk was normal and fasciculations were absent.   Motor Tone - Muscle  tone was assessed at the neck and appendages and was normal.  Reflexes - The patient's reflexes were symmetrical in all extremities and he had no pathological reflexes.  Sensory - Light touch, temperature/pinprick were assessed and were symmetrical.    Coordination - The patient had normal movements in the hands with no ataxia or dysmetria.  Tremor was absent.  Gait and Station - deferred.   ASSESSMENT/PLAN Mr. Macauley Mossberg is a 66 y.o. male with history of hypertension, diabetes, lung cancer s/p chemotherapy and radiation admitted for chest pain, left leg weakness left facial droop and slurred speech.  TNK given.    Possible stroke: Possible right brain stroke infarct s/p TNK, MRI pending CT no acute abnormality CT head and neck left ICA 70% stenosis MRI pending 2D Echo EF more than 75%, no PFO on bubble study LDL pending HgbA1c 7.2 SCDs for VTE prophylaxis No antithrombotic prior to admission, now on No antithrombotic within 24 hours of TNK Patient counseled to be compliant with his antithrombotic medications Ongoing aggressive stroke risk factor management Therapy recommendations: None Disposition: Pending  Diabetes HgbA1c 7.2 goal < 7.0 Uncontrolled CBG monitoring SSI DM education and close PCP follow up  Hypertension Stable Long term BP goal normotensive  Hyperlipidemia Home meds: Crestor 10 LDL pending, goal < 70 Now on Crestor 20 Continue statin at discharge  AKI on CKD 3B Creatinine 1.66-2.17 Encourage p.o. intake Put on IV fluid at 50 BMP monitoring  Other Stroke Risk Factors Advanced age Former cigarette smoker  Other Active Problems History of lung cancer status post chemotherapy and radiation, pan CT this time no mets  Hospital day # 1  This  patient is critically ill due to stroke status post TNK, AKI on CKD and at significant risk of neurological worsening, death form recurrent stroke, hemorrhagic transformation, bleeding from TNK, renal failure. This patient's care requires constant monitoring of vital signs, hemodynamics, respiratory and cardiac monitoring, review of multiple databases, neurological assessment, discussion with family, other specialists and medical decision making of high complexity. I spent 40 minutes of neurocritical care time in the care of this patient.    Rosalin Hawking, MD PhD Stroke Neurology 12/23/2021 11:52 AM    To contact Stroke Continuity provider, please refer to [email or web address] After hours, contact General Neurology

## 2021-12-23 NOTE — Evaluation (Signed)
Physical Therapy Evaluation Patient Details Name: Travis [name] MRN: [id number] DOB: May 25, 1955 Today's Date: 12/23/2021  History of Present Illness  Pt is 66 year old presented to [facility] on 9/30 with lt sided weakness. Pt given TPA and transported to [facility]. PMH - lung CA, DM, HTN, ckd, arthritis.  Clinical Impression  Pt presents to PT with slightly unsteady gait due to illness and inactivity. Expect pt will make good progress back to baseline with mobility. Will follow acutely but doubt pt will need PT after DC.         Recommendations for follow up therapy are one component of a multi-disciplinary discharge planning process, led by the attending physician.  Recommendations may be updated based on patient status, additional functional criteria and insurance authorization.  Follow Up Recommendations No PT follow up      Assistance Recommended at Discharge PRN  Patient can return home with the following       Equipment Recommendations None recommended by PT  Recommendations for Other Services       Functional Status Assessment Patient has had a recent decline in their functional status and demonstrates the ability to make significant improvements in function in a reasonable and predictable amount of time.     Precautions / Restrictions Precautions Precautions: Fall      Mobility  Bed Mobility Overal bed mobility: Needs Assistance Bed Mobility: Supine to Sit     Supine to sit: Supervision, HOB elevated     General bed mobility comments: supervision for lines/tubes    Transfers Overall transfer level: Needs assistance Equipment used: Rolling walker (2 wheels), None Transfers: Sit to/from Stand Sit to Stand: Min guard           General transfer comment: Assist for safety    Ambulation/Gait Ambulation/Gait assistance: Min guard, Supervision Gait Distance (Feet): 300 Feet Assistive device: Rolling walker (2 wheels), None Gait Pattern/deviations:  Decreased stance time - left, Decreased stride length Gait velocity: adequate Gait velocity interpretation: >2.62 ft/sec, indicative of community ambulatory   General Gait Details: Assist for safety. Pt with slight decr in lt step  Stairs            Wheelchair Mobility    Modified Rankin (Stroke Patients Only) Modified Rankin (Stroke Patients Only) Pre-Morbid Rankin Score: No symptoms Modified Rankin: Moderately severe disability     Balance Overall balance assessment: Needs assistance Sitting-balance support: No upper extremity supported, Feet supported Sitting balance-Leahy Scale: Normal     Standing balance support: No upper extremity supported, During functional activity Standing balance-Leahy Scale: Good                               Pertinent Vitals/Pain Pain Assessment Pain Assessment: No/denies pain    Home Living Family/patient expects to be discharged to:: Private residence Living Arrangements: Children Available Help at Discharge: Family;Available PRN/intermittently Type of Home: House Home Access: Stairs to enter Entrance Stairs-Rails: Right Entrance Stairs-Number of Steps: 4-5   Home Layout: Two level;Able to live on main level with bedroom/bathroom Home Equipment: Rolling Walker (2 wheels);BSC/3in1;Wheelchair - manual Additional Comments: Equipment was wife's who recently passed away    Prior Function Prior Level of Function : Independent/Modified Independent;Driving;Working/employed             Mobility Comments: Works as Artist  Upper Extremity Assessment: Defer to OT evaluation    Lower Extremity Assessment Lower Extremity Assessment: LLE deficits/detail LLE Deficits / Details: grossly 4/5       Communication      Cognition Arousal/Alertness: Awake/alert Behavior During Therapy: WFL for tasks assessed/performed Overall  Cognitive Status: Within Functional Limits for tasks assessed                                 General Comments: Did not assess advanced skills        General Comments General comments (skin integrity, edema, etc.): VSS on RA    Exercises     Assessment/Plan    PT Assessment Patient needs continued PT services  PT Problem List Decreased strength;Decreased balance;Decreased mobility       PT Treatment Interventions DME instruction;Gait training;Stair training;Functional mobility training;Therapeutic activities;Therapeutic exercise;Balance training;Patient/family education    PT Goals (Current goals can be found in the Care Plan section)  Acute Rehab PT Goals Patient Stated Goal: return home PT Goal Formulation: With patient Time For Goal Achievement: 12/30/21 Potential to Achieve Goals: Good    Frequency Min 3X/week     Co-evaluation               AM-PAC PT "6 Clicks" Mobility  Outcome Measure Help needed turning from your back to your side while in a flat bed without using bedrails?: None Help needed moving from lying on your back to sitting on the side of a flat bed without using bedrails?: A Little Help needed moving to and from a bed to a chair (including a wheelchair)?: A Little Help needed standing up from a chair using your arms (e.g., wheelchair or bedside chair)?: A Little Help needed to walk in hospital room?: A Little Help needed climbing 3-5 steps with a railing? : A Little 6 Click Score: 19    End of Session Equipment Utilized During Treatment: Gait belt Activity Tolerance: Patient tolerated treatment well Patient left: in chair;with call bell/phone within reach;with chair alarm set Nurse Communication: Mobility status PT Visit Diagnosis: Other abnormalities of gait and mobility (R26.89)    Time: 8372-9021 PT Time Calculation (min) (ACUTE ONLY): 22 min   Charges:   PT Evaluation $PT Eval Low Complexity: [place] [facility] [place] 12/23/2021, 3:46 PM

## 2021-12-24 DIAGNOSIS — I10 Essential (primary) hypertension: Secondary | ICD-10-CM | POA: Diagnosis not present

## 2021-12-24 DIAGNOSIS — E7800 Pure hypercholesterolemia, unspecified: Secondary | ICD-10-CM

## 2021-12-24 DIAGNOSIS — E11649 Type 2 diabetes mellitus with hypoglycemia without coma: Secondary | ICD-10-CM | POA: Diagnosis not present

## 2021-12-24 LAB — LIPID PANEL
Cholesterol: 156 mg/dL (ref 0–200)
HDL: 36 mg/dL — ABNORMAL LOW (ref >40)
LDL Cholesterol: 80 mg/dL (ref 0–99)
Total CHOL/HDL Ratio: 4.3 RATIO
Triglycerides: 200 mg/dL — ABNORMAL HIGH (ref <150)
VLDL: 40 mg/dL (ref 0–40)

## 2021-12-24 LAB — GLUCOSE, CAPILLARY
Glucose-Capillary: 260 mg/dL — ABNORMAL HIGH (ref 70–99)
Glucose-Capillary: 139 mg/dL — ABNORMAL HIGH (ref 70–99)
Glucose-Capillary: 222 mg/dL — ABNORMAL HIGH (ref 70–99)

## 2021-12-24 LAB — BASIC METABOLIC PANEL
Anion gap: 7 (ref 5–15)
BUN: 30 mg/dL — ABNORMAL HIGH (ref 8–23)
CO2: 28 mmol/L (ref 22–32)
Calcium: 8.4 mg/dL — ABNORMAL LOW (ref 8.9–10.3)
Chloride: 107 mmol/L (ref 98–111)
Creatinine, Ser: 2.36 mg/dL — ABNORMAL HIGH (ref 0.61–1.24)
GFR, Estimated: 30 mL/min — ABNORMAL LOW (ref >60)
Glucose, Bld: 265 mg/dL — ABNORMAL HIGH (ref 70–99)
Potassium: 4.4 mmol/L (ref 3.5–5.1)
Sodium: 142 mmol/L (ref 135–145)

## 2021-12-24 LAB — CBC
HCT: 30.5 % — ABNORMAL LOW (ref 39.0–52.0)
Hemoglobin: 10.3 g/dL — ABNORMAL LOW (ref 13.0–17.0)
MCH: 31 pg (ref 26.0–34.0)
MCHC: 33.8 g/dL (ref 30.0–36.0)
MCV: 91.9 fL (ref 80.0–100.0)
Platelets: 220 10*3/uL (ref 150–400)
RBC: 3.32 MIL/uL — ABNORMAL LOW (ref 4.22–5.81)
RDW: 12.5 % (ref 11.5–15.5)
WBC: 5.6 10*3/uL (ref 4.0–10.5)
nRBC: 0 % (ref 0.0–0.2)

## 2021-12-24 MED ORDER — INSULIN ASPART 100 UNIT/ML IJ SOLN: 3.0000 [IU] | Freq: Three times a day (TID) | INTRAMUSCULAR | Status: DC

## 2021-12-24 MED ORDER — ASPIRIN 81 MG PO TBEC: 81.0000 mg | DELAYED_RELEASE_TABLET | Freq: Every day | ORAL | 12 refills | Status: DC

## 2021-12-24 MED ORDER — INSULIN GLARGINE-YFGN 100 UNIT/ML ~~LOC~~ SOLN
32.0000 [IU] | Freq: Every day | SUBCUTANEOUS | Status: DC
  Administered 2021-12-24: 32 [IU] via SUBCUTANEOUS
  Filled 2021-12-24: qty 0.32

## 2021-12-24 MED ORDER — CLOPIDOGREL BISULFATE 75 MG PO TABS: 75.0000 mg | ORAL_TABLET | Freq: Every day | ORAL | 0 refills | Status: DC

## 2021-12-24 MED ORDER — ROSUVASTATIN CALCIUM 20 MG PO TABS: 20.0000 mg | ORAL_TABLET | Freq: Every day | ORAL | 1 refills | Status: DC

## 2021-12-24 MED ORDER — INSULIN GLARGINE-YFGN 100 UNIT/ML ~~LOC~~ SOLN: 32.0000 [IU] | Freq: Every day | SUBCUTANEOUS | 11 refills | Status: DC

## 2021-12-24 NOTE — Discharge Summary (Addendum)
Stroke Discharge Summary  Patient ID: Travis [name]   MRN: [id number]      DOB: 05-31-1955  Date of Admission: 12/22/2021 Date of Discharge: 12/24/2021  Attending Physician:  Stroke, Md, MD, Stroke MD Consultant(s):    neurology  Patient's PCP:  Debroah Loop, PA-C  DISCHARGE DIAGNOSIS:  Principal Problem:   Possible TIA   Stroke like symptoms s/p TNK  Active Problems:   HTN   HLD   DM   AKI   Allergies as of 12/24/2021   No Known Allergies      Medication List     STOP taking these medications    insulin glargine 100 UNIT/ML Solostar Pen Commonly known as: LANTUS   lidocaine 2 % solution Commonly known as: XYLOCAINE   losartan 25 MG tablet Commonly known as: COZAAR       TAKE these medications    albuterol 108 (90 Base) MCG/ACT inhaler Commonly known as: VENTOLIN HFA Inhale 2 puffs into the lungs every 6 (six) hours as needed for wheezing or shortness of breath.   amitriptyline 50 MG tablet Commonly known as: ELAVIL Take 50 mg by mouth at bedtime.   amLODipine 5 MG tablet Commonly known as: NORVASC Take 5 mg by mouth daily.   aspirin EC 81 MG tablet Take 1 tablet (81 mg total) by mouth daily. Swallow whole. Start taking on: December 25, 2021   budesonide-formoterol 160-4.5 MCG/ACT inhaler Commonly known as: Symbicort Inhale 2 puffs into the lungs 2 (two) times daily.   clopidogrel 75 MG tablet Commonly known as: PLAVIX Take 1 tablet (75 mg total) by mouth daily. Start taking on: December 25, 2021   DULoxetine 30 MG capsule Commonly known as: CYMBALTA Take 30 mg by mouth daily.   empagliflozin 25 MG Tabs tablet Commonly known as: JARDIANCE Take 1 tablet (25 mg total) by mouth daily.   famotidine 20 MG tablet Commonly known as: PEPCID Take 20 mg by mouth daily.   gabapentin 300 MG capsule Commonly known as: NEURONTIN Take 2 capsules (600 mg total) by mouth 2 (two) times daily. What changed: when to take this   glimepiride 2 MG  tablet Commonly known as: AMARYL Take 2 mg by mouth daily with breakfast.   glucose blood test strip 4x a day   hydrALAZINE 50 MG tablet Commonly known as: APRESOLINE Take 100 mg by mouth 2 (two) times daily.   insulin aspart 100 UNIT/ML FlexPen Commonly known as: NOVOLOG Inject 6-16 Units into the skin See admin instructions. BS 180-200 add extra 2 units BS 201-250 add 5 units BS 251-300 add 7 units 301-350 add 10 units 351-400 add 12 units  bs >400 call PCP   insulin glargine-yfgn 100 UNIT/ML injection Commonly known as: SEMGLEE Inject 0.32 mLs (32 Units total) into the skin daily. Start taking on: December 25, 2021   Insulin Pen Needle 32G X 4 MM Misc Commonly known as: BD Pen Needle Nano U/F 4x daily   ketoconazole 2 % cream Commonly known as: NIZORAL Apply 1 application topically 2 (two) times daily as needed for irritation.   lidocaine 5 % Commonly known as: Lidoderm Place 1 patch onto the skin daily. Remove & Discard patch within 12 hours or as directed by MD   magnesium oxide 400 (241.3 Mg) MG tablet Commonly known as: MAG-OX Take 400 mg by mouth 2 (two) times daily.   metoprolol succinate 100 MG 24 hr tablet Commonly known as: TOPROL-XL Take 100 mg by  mouth 2 (two) times daily.   pantoprazole 40 MG tablet Commonly known as: PROTONIX Take 40 mg by mouth daily.   potassium chloride 10 MEQ tablet Commonly known as: KLOR-CON Take 1 tablet (10 mEq total) by mouth daily.   rosuvastatin 20 MG tablet Commonly known as: CRESTOR Take 1 tablet (20 mg total) by mouth daily. Start taking on: December 25, 2021 What changed:  medication strength how much to take   tizanidine 2 MG capsule Commonly known as: Zanaflex Take 1 capsule (2 mg total) by mouth 3 (three) times daily as needed for muscle spasms.   torsemide 20 MG tablet Commonly known as: DEMADEX Take 1 tablet (20 mg total) by mouth 2 (two) times daily.   traMADol 50 MG tablet Commonly known as:  ULTRAM Take 1 tablet (50 mg total) by mouth every 8 (eight) hours as needed for moderate pain.   Trelegy Ellipta 100-62.5-25 MCG/ACT Aepb Generic drug: Fluticasone-Umeclidin-Vilant Inhale 1 puff into the lungs daily.   valsartan 320 MG tablet Commonly known as: DIOVAN Take 320 mg by mouth daily.   Vitamin D (Ergocalciferol) 1.25 MG (50000 UNIT) Caps capsule Commonly known as: DRISDOL Take 50,000 Units by mouth every Monday.        LABORATORY STUDIES CBC    Component Value Date/Time   WBC 5.6 12/24/2021 0530   RBC 3.32 (L) 12/24/2021 0530   HGB 10.3 (L) 12/24/2021 0530   HGB 10.4 (L) 09/04/2018 0950   HCT 30.5 (L) 12/24/2021 0530   PLT 220 12/24/2021 0530   PLT 257 09/04/2018 0950   MCV 91.9 12/24/2021 0530   MCH 31.0 12/24/2021 0530   MCHC 33.8 12/24/2021 0530   RDW 12.5 12/24/2021 0530   LYMPHSABS 1.4 12/22/2021 1241   MONOABS 0.6 12/22/2021 1241   EOSABS 0.2 12/22/2021 1241   BASOSABS 0.1 12/22/2021 1241   CMP    Component Value Date/Time   NA 142 12/24/2021 0530   K 4.4 12/24/2021 0530   CL 107 12/24/2021 0530   CO2 28 12/24/2021 0530   GLUCOSE 265 (H) 12/24/2021 0530   BUN 30 (H) 12/24/2021 0530   CREATININE 2.36 (H) 12/24/2021 0530   CREATININE 1.06 09/04/2018 0950   CALCIUM 8.4 (L) 12/24/2021 0530   PROT 6.4 (L) 12/22/2021 1241   ALBUMIN 3.0 (L) 12/22/2021 1241   AST 16 12/22/2021 1241   AST 12 (L) 09/04/2018 0950   ALT 23 12/22/2021 1241   ALT 19 09/04/2018 0950   ALKPHOS 80 12/22/2021 1241   BILITOT 0.4 12/22/2021 1241   BILITOT 0.3 09/04/2018 0950   GFRNONAA 30 (L) 12/24/2021 0530   GFRNONAA >60 09/04/2018 0950   GFRAA >60 10/08/2019 2040   GFRAA >60 09/04/2018 0950   COAGS Lab Results  Component Value Date   INR 0.76 05/05/2018   INR 0.91 04/20/2018   INR 0.91 05/25/2011   Lipid Panel    Component Value Date/Time   CHOL 156 12/24/2021 0530   TRIG 200 (H) 12/24/2021 0530   HDL 36 (L) 12/24/2021 0530   CHOLHDL 4.3 12/24/2021 0530    VLDL 40 12/24/2021 0530   LDLCALC 80 12/24/2021 0530   HgbA1C  Lab Results  Component Value Date   HGBA1C 7.2 (H) 12/23/2021   Urinalysis    Component Value Date/Time   COLORURINE YELLOW 12/22/2021 1326   APPEARANCEUR CLEAR 12/22/2021 1326   LABSPEC 1.015 12/22/2021 1326   PHURINE 5.5 12/22/2021 1326   GLUCOSEU >=500 (A) 12/22/2021 1326   HGBUR NEGATIVE 12/22/2021  [place] 12/22/2021 [place] 12/22/2021 1326   PROTEINUR 100 (A) 12/22/2021 1326   NITRITE NEGATIVE 12/22/2021 1326   LEUKOCYTESUR NEGATIVE 12/22/2021 1326   Urine Drug Screen     Component Value Date/Time   LABOPIA NONE DETECTED 12/22/2021 1326   COCAINSCRNUR NONE DETECTED 12/22/2021 1326   LABBENZ NONE DETECTED 12/22/2021 1326   AMPHETMU NONE DETECTED 12/22/2021 1326   THCU NONE DETECTED 12/22/2021 1326   LABBARB NONE DETECTED 12/22/2021 1326    Alcohol Level No results found for: "ETH"   SIGNIFICANT DIAGNOSTIC STUDIES MR BRAIN WO CONTRAST  Result Date: 12/23/2021 CLINICAL DATA:  Stroke suspected, status post TNK EXAM: MRI HEAD WITHOUT CONTRAST TECHNIQUE: Multiplanar, multiecho pulse sequences of the brain and surrounding structures were obtained without intravenous contrast. COMPARISON:  07/24/2018 FINDINGS: Brain: No restricted diffusion to suggest acute or subacute infarct. No acute hemorrhage, mass, mass effect, midline shift. No hemosiderin deposition to suggest remote hemorrhage. No hydrocephalus or extra-axial collection. Vascular: Normal arterial flow voids. Skull and upper cervical spine: Normal marrow signal. Sinuses/Orbits: No acute finding. Other: The mastoids are well aerated. IMPRESSION: No acute intracranial process. No evidence of acute or subacute infarct. Electronically Signed   By: Merilyn Baba M.D.   On: 12/23/2021 20:14   ECHOCARDIOGRAM COMPLETE BUBBLE STUDY  Result Date: 12/23/2021    ECHOCARDIOGRAM REPORT   Patient Name:   Darius Lundberg Date of  Exam: 12/23/2021 Medical Rec #:  [id number]       Height:       70.0 in Accession #:    [id number]      Weight:       194.9 lb Date of Birth:  April 30, 1955        BSA:          2.064 m Patient Age:    61 years        BP:           144/84 mmHg Patient Gender: M               HR:           73 bpm. Exam Location:  Inpatient Procedure: 2D Echo, Cardiac Doppler, Color Doppler and Saline Contrast Bubble            Study Indications:    Stroke  History:        Patient has prior history of Echocardiogram examinations, most                 recent 08/18/2018. COPD; Risk Factors:Hypertension, Diabetes and                 Former Smoker. CKD.  Sonographer:    Clayton Lefort RDCS (AE) Referring Phys: Alferd Patee [facility] IMPRESSIONS  1. Left ventricular ejection fraction, by estimation, is >75%. The left ventricle has hyperdynamic function. The left ventricle has no regional wall motion abnormalities. There is moderate concentric left ventricular hypertrophy. Left ventricular diastolic parameters are consistent with Grade I diastolic dysfunction (impaired relaxation).  2. Right ventricular systolic function is normal. The right ventricular size is normal. Tricuspid regurgitation signal is inadequate for assessing PA pressure.  3. The mitral valve is grossly normal. No evidence of mitral valve regurgitation.  4. The aortic valve was not well visualized. Aortic valve regurgitation is trivial. Mild aortic valve stenosis. Aortic valve area, by VTI measures 1.52 cm. Aortic valve mean gradient measures 11.0 mmHg. Peak gradient 18 mmHg. DI is 0.49.  5. The  inferior vena cava is normal in size with greater than 50% respiratory variability, suggesting right atrial pressure of 3 mmHg.  6. Agitated saline contrast bubble study was negative, with no evidence of any interatrial shunt.  7. Trivial circumferential pericardial effusion. Comparison(s): Changes from prior study are noted. 08/18/2018: LVEF >65%, mild LVH, trivial pericardial effusion. FINDINGS   Left Ventricle: Left ventricular ejection fraction, by estimation, is >75%. The left ventricle has hyperdynamic function. The left ventricle has no regional wall motion abnormalities. The left ventricular internal cavity size was normal in size. There is moderate concentric left ventricular hypertrophy. Left ventricular diastolic parameters are consistent with Grade I diastolic dysfunction (impaired relaxation). Indeterminate filling pressures. Right Ventricle: The right ventricular size is normal. No increase in right ventricular wall thickness. Right ventricular systolic function is normal. Tricuspid regurgitation signal is inadequate for assessing PA pressure. Left Atrium: Left atrial size was normal in size. Right Atrium: Right atrial size was normal in size. Pericardium: Trivial pericardial effusion is present. The pericardial effusion is circumferential. The pericardial effusion appears to contain mixed echogenic material. Mitral Valve: The mitral valve is grossly normal. No evidence of mitral valve regurgitation. Tricuspid Valve: The tricuspid valve is grossly normal. Tricuspid valve regurgitation is trivial. Aortic Valve: The aortic valve was not well visualized. Aortic valve regurgitation is trivial. Mild aortic stenosis is present. Aortic valve mean gradient measures 11.0 mmHg. Aortic valve peak gradient measures 18.0 mmHg. Aortic valve area, by VTI measures 1.52 cm. Pulmonic Valve: The pulmonic valve was normal in structure. Pulmonic valve regurgitation is not visualized. Aorta: The ascending aorta was not well visualized and the aortic root was not well visualized. Venous: The inferior vena cava is normal in size with greater than 50% respiratory variability, suggesting right atrial pressure of 3 mmHg. IAS/Shunts: No atrial level shunt detected by color flow Doppler. Agitated saline contrast was given intravenously to evaluate for intracardiac shunting. Agitated saline contrast bubble study was  negative, with no evidence of any interatrial shunt.  LEFT VENTRICLE PLAX 2D LVIDd:         4.60 cm   Diastology LVIDs:         2.50 cm   LV e' medial:    6.85 cm/s LV PW:         1.40 cm   LV E/e' medial:  10.3 LV IVS:        1.40 cm   LV e' lateral:   7.07 cm/s LVOT diam:     2.00 cm   LV E/e' lateral: 10.0 LV SV:         62 LV SV Index:   30 LVOT Area:     3.14 cm  RIGHT VENTRICLE RV Basal diam:  2.90 cm RV S prime:     15.90 cm/s TAPSE (M-mode): 2.0 cm LEFT ATRIUM             Index        RIGHT ATRIUM           Index LA diam:        3.20 cm 1.55 cm/m   RA Area:     13.80 cm LA Vol (A2C):   31.2 ml 15.11 ml/m  RA Volume:   34.00 ml  16.47 ml/m LA Vol (A4C):   53.1 ml 25.72 ml/m LA Biplane Vol: 41.4 ml 20.05 ml/m  AORTIC VALVE AV Area (Vmax):    1.46 cm AV Area (Vmean):   1.27 cm AV Area (VTI):  1.52 cm AV Vmax:           212.00 cm/s AV Vmean:          154.000 cm/s AV VTI:            0.406 m AV Peak Grad:      18.0 mmHg AV Mean Grad:      11.0 mmHg LVOT Vmax:         98.50 cm/s LVOT Vmean:        62.300 cm/s LVOT VTI:          0.197 m LVOT/AV VTI ratio: 0.49  AORTA Ao Root diam: 2.90 cm MITRAL VALVE MV Area (PHT): 3.99 cm    SHUNTS MV Decel Time: 190 msec    Systemic VTI:  0.20 m MV E velocity: 70.60 cm/s  Systemic Diam: 2.00 cm MV A velocity: 79.50 cm/s MV E/A ratio:  0.89 Lyman Bishop MD Electronically signed by Lyman Bishop MD Signature Date/Time: 12/23/2021/4:33:52 PM    Final    CT ANGIO HEAD NECK W WO CM  Result Date: 12/22/2021 CLINICAL DATA:  Provided history: Neuro deficit, acute, stroke suspected. EXAM: CT ANGIOGRAPHY HEAD AND NECK TECHNIQUE: Multidetector CT imaging of the head and neck was performed using the standard protocol during bolus administration of intravenous contrast. Multiplanar CT image reconstructions and MIPs were obtained to evaluate the vascular anatomy. Carotid stenosis measurements (when applicable) are obtained utilizing NASCET criteria, using the distal internal  carotid diameter as the denominator. RADIATION DOSE REDUCTION: This exam was performed according to the departmental dose-optimization program which includes automated exposure control, adjustment of the mA and/or kV according to patient size and/or use of iterative reconstruction technique. CONTRAST:  170mL OMNIPAQUE IOHEXOL 350 MG/ML SOLN COMPARISON:  Head CT 10/21/2021. FINDINGS: CT HEAD FINDINGS Brain: No age advanced or lobar predominant parenchymal atrophy. There is no acute intracranial hemorrhage. No demarcated cortical infarct. No extra-axial fluid collection. No evidence of an intracranial mass. No midline shift. Vascular: No hyperdense vessel.  Atherosclerotic calcifications. Skull: No fracture or aggressive osseous lesion. Sinuses/Orbits: No orbital mass or acute orbital finding. Small mucous retention cyst within the left maxillary sinus. Review of the MIP images confirms the above findings CTA NECK FINDINGS Aortic arch: Standard aortic branching. Atherosclerotic plaque within the visualized aortic arch and proximal major branch vessels of the neck. No hemodynamically significant innominate or proximal subclavian artery stenosis. Right carotid system: CCA and ICA patent within the neck without hemodynamically significant stenosis (50% or greater). Atherosclerotic plaque, greatest about the carotid bifurcation and within the proximal ICA. Left carotid system: CCA and ICA patent within the neck. Atherosclerotic plaque, greatest about the carotid bifurcation and within the proximal ICA. Estimated 70% stenosis within the proximal ICA. Vertebral arteries: Vertebral arteries patent within the neck. Severe atherosclerotic stenoses at the origins of both vessels. Skeleton: Cervical spondylosis with multilevel disc space narrowing, disc bulges/central disc protrusions, posterior disc osteophyte complexes, uncovertebral hypertrophy and facet arthrosis. Multilevel spinal canal stenosis. Most notably, a posterior  disc osteophyte complex contributes to severe spinal canal stenosis at C5-C6. Multilevel bony neural foraminal narrowing. Bulky multilevel ventral osteophytes. No acute fracture or aggressive osseous lesion. Other neck: No neck mass or cervical lymphadenopathy. Upper chest: Separately reported on concurrently performed CT chest/abdomen/pelvis. Review of the MIP images confirms the above findings CTA HEAD FINDINGS Anterior circulation: An ICA atherosclerotic plaque within both vessels. Up to moderate stenosis within the right cavernous segment. No more than mild stenosis of the intracranial left ICA.  The M1 middle cerebral arteries are patent. No M2 proximal branch occlusion or high-grade proximal stenosis. The anterior cerebral arteries are patent. No intracranial aneurysm is identified. Posterior circulation: The intracranial vertebral arteries are patent. The basilar artery is patent. The posterior cerebral arteries are patent. Hypoplastic P1 segments with sizable posterior communicating arteries, bilaterally. Venous sinuses: Within the limitations of contrast timing, no convincing thrombus. Anatomic variants: As described. Review of the MIP images confirms the above findings IMPRESSION: CT head: No evidence of acute intracranial abnormality. CTA neck: 1. The common carotid and internal carotid arteries are patent within the neck. Atherosclerotic plaque, bilaterally. Most notably, there is an estimated 70% atherosclerotic stenosis within the proximal left ICA. 2. Vertebral arteries patent within the neck. Severe atherosclerotic narrowing at the origin of both vessels. 3.  Aortic Atherosclerosis (ICD10-I70.0). 4. Cervical spondylosis, as described. Most notably, severe spinal canal stenosis is present at C5-C6. CTA head: 1. No intracranial large vessel occlusion or proximal high-grade arterial stenosis identified. 2. Atherosclerotic plaque within the intracranial ICAs. Most notably, there is up to moderate  atherosclerotic narrowing within the right cavernous segment. Electronically Signed   By: Kellie Simmering D.O.   On: 12/22/2021 13:37   CT Angio Chest/Abd/Pel for Dissection W and/or Wo Contrast  Result Date: 12/22/2021 CLINICAL DATA:  Chest pain, altered mental status history of lung cancer * Tracking Code: BO * EXAM: CT ANGIOGRAPHY CHEST, ABDOMEN AND PELVIS TECHNIQUE: Non-contrast CT of the chest was initially obtained. Multidetector CT imaging through the chest, abdomen and pelvis was performed using the standard protocol during bolus administration of intravenous contrast. Multiplanar reconstructed images and MIPs were obtained and reviewed to evaluate the vascular anatomy. RADIATION DOSE REDUCTION: This exam was performed according to the departmental dose-optimization program which includes automated exposure control, adjustment of the mA and/or kV according to patient size and/or use of iterative reconstruction technique. CONTRAST:  19mL OMNIPAQUE IOHEXOL 350 MG/ML SOLN COMPARISON:  CT chest, 08/22/2021, CT abdomen pelvis, 11/13/2020 FINDINGS: CTA CHEST FINDINGS VASCULAR Aorta: Satisfactory opacification of the aorta. Normal contour and caliber of the thoracic aorta. No evidence of aneurysm, dissection, or other acute aortic pathology. Mild mixed aortic atherosclerosis. Cardiovascular: Left chest port catheter. No evidence of pulmonary embolism on limited non-tailored examination. Normal heart size. No pericardial effusion. Review of the MIP images confirms the above findings. NON VASCULAR Mediastinum/Nodes: No enlarged mediastinal, hilar, or axillary lymph nodes. Thyroid gland, trachea, and esophagus demonstrate no significant findings. Lungs/Pleura: Unchanged post treatment appearance of the perihilar right lung with bandlike scarring and fibrosis. Multiple small bilateral pulmonary nodules are unchanged, for example a 0.4 cm pulmonary nodule of the peripheral right lower lobe (series 6, image 45) and a  0.3 cm nodule of the medial left upper lobe (series 6, image 26). Mild, predominantly paraseptal emphysema. No pleural effusion or pneumothorax. Musculoskeletal: No chest wall abnormality. No acute osseous findings. Review of the MIP images confirms the above findings. CTA ABDOMEN AND PELVIS FINDINGS VASCULAR Normal contour and caliber of the abdominal aorta. No evidence of aneurysm, dissection, or other acute aortic pathology. Standard branching pattern of the abdominal aorta with solitary bilateral renal arteries. Severe mixed calcific atherosclerosis. Review of the MIP images confirms the above findings. NON-VASCULAR Hepatobiliary: No solid liver abnormality is seen. No gallstones, gallbladder wall thickening, or biliary dilatation. Pancreas: Unremarkable. No pancreatic ductal dilatation or surrounding inflammatory changes. Spleen: Normal in size without significant abnormality. Adrenals/Urinary Tract: Adrenal glands are unremarkable. Kidneys are normal, without renal calculi, solid lesion,  or hydronephrosis. Bladder is unremarkable. Stomach/Bowel: Stomach is within normal limits. Appendix appears normal. No evidence of bowel wall thickening, distention, or inflammatory changes. Lymphatic: No enlarged abdominal or pelvic lymph nodes. Reproductive: No mass or other significant abnormality. Other: No abdominal wall hernia or abnormality. No ascites. Musculoskeletal: No acute osseous findings. IMPRESSION: 1. Normal contour and caliber of the thoracic and abdominal aorta. No evidence of aneurysm, dissection, or other acute aortic pathology. Mixed aortic atherosclerosis. 2. Unchanged post treatment appearance of the perihilar right lung with bandlike scarring and fibrosis. 3. Multiple small bilateral pulmonary nodules are unchanged. Attention on follow-up. 4. No evidence of metastatic disease in the abdomen or pelvis. Aortic Atherosclerosis (ICD10-I70.0) and Emphysema (ICD10-J43.9). Electronically Signed   By: Delanna Ahmadi M.D.   On: 12/22/2021 13:24      HISTORY OF PRESENT ILLNESS Duwane Gewirtz is a 66 y.o. male with PMH significant for DM2, HTN, lung cancer s/p chemo radiation who presents with acute onset chest discomfort, LLE weakness and L facial droop. LKW 11AM. Symptoms started about an hours prior to arrival to [facility] ED.   He was activated as a code stroke and NIHSS of 5 for facial palst, LLE weakness, numbness and dysarthric speech.   CTH was negative for ICH or large infarct. CTA with no LVO. He was given tnkase and transferred to [facility] for further evaluation and post tnkase checks.   LKW: 1100 on 12/22/21. mRS: 0 tNKASE: given at [facility] ED Thrombectomy: not offered 2/2 no LVO  HOSPITAL COURSE Mr. Rafael Salway is a 66 y.o. male with history of hypertension, diabetes, lung cancer s/p chemotherapy and radiation admitted for chest pain, left leg weakness left facial droop and slurred speech.  TNK given.   Possible TIA Stroke like symptoms s/p TNK CT no acute abnormality CT head and neck left ICA 70% stenosis MRI showed no acute intracranial process and no evidence of acute/subacute infarct. 2D Echo EF more than 75%, no PFO on bubble study LDL showed elevated triglycerides HgbA1c 7.2 SCDs for VTE prophylaxis No antithrombotic prior to admission Patient counseled to be compliant with his antithrombotic medications Ongoing aggressive stroke risk factor management Therapy recommendations: None Disposition: Home   Diabetes HgbA1c 7.2 goal < 7.0 Uncontrolled CBG monitoring SSI DM education and close PCP follow up   Hypertension Stable Long term BP goal normotensive   Hyperlipidemia Home meds: Crestor 10 LDL showed elevated triglycerides goal < 70 Now on Crestor 20 Continue statin at discharge   AKI on CKD 3B Creatinine 1.66-2.36, most likely contrast induced Encourage p.o. intake Put on IV fluid at 50 BMP monitoring Follow-up with nephrologist outpatient    Other Stroke Risk Factors Advanced age Former cigarette smoker   Other Active Problems History of lung cancer status post chemotherapy and radiation, pan CT this time no mets   DISCHARGE EXAM Blood pressure (!) 141/72, pulse 92, temperature (!) 97.5 F (36.4 C), temperature source Oral, resp. rate 17, height 5\' 10"  (1.778 m), weight 88.4 kg, SpO2 93 %.   Discharge Diet       Diet   Diet Carb Modified Fluid consistency: Thin; Room service appropriate? Yes   liquids  DISCHARGE PLAN Disposition:  home aspirin 81 mg daily and clopidogrel 75 mg daily for secondary stroke prevention for 3 weeks then aspirin alone. Ongoing stroke risk factor control by Primary Care Physician at time of discharge Follow-up PCP Debroah Loop, PA-C in 2 weeks. Follow-up in [facility]  in 4 weeks, office to schedule an appointment.   35 minutes were spent preparing discharge.  Stormy Fabian, MD PGY-1 Psychiatry  ATTENDING NOTE: I reviewed above note and agree with the assessment and plan. Pt was seen and examined.   Daughter at the bedside. Pt no acute event overnight. Cre still elevated but likely due to CT studies with contrast. Recommend close follow up with renal services in 1-2 weeks to repeat Cre. Pt and daughter expressed understanding. Glucose still high, continue home insulin on discharge. Will follow up at [place] in 4 weeks.   For detailed assessment and plan, please refer to above/below as I have made changes wherever appropriate.   Rosalin Hawking, MD PhD Stroke Neurology 12/24/2021 11:31 PM

## 2021-12-24 NOTE — Evaluation (Signed)
Occupational Therapy Evaluation and Discharge Patient Details Name: Travis [name] MRN: [id number] DOB: 1956/03/20 Today's Date: 12/24/2021   History of Present Illness Pt is 66 year old presented to [facility] on 9/30 with lt sided weakness. Pt given TPA and transported to [facility]. PMH - lung CA, DM, HTN, ckd, arthritis.   Clinical Impression   Pt is typically independent. Currently functioning at a set up supervision level in ADLs and mobility. Pt has some very mild residual fine motor deficits which are likely to return as pt continues to use his L hand functionally. Educated in s/s of stroke, risk factors and importance of seeking medical attention quickly, pt verbalized understanding. No further OT needs.      Recommendations for follow up therapy are one component of a multi-disciplinary discharge planning process, led by the attending physician.  Recommendations may be updated based on patient status, additional functional criteria and insurance authorization.   Follow Up Recommendations  No OT follow up    Assistance Recommended at Discharge Intermittent Supervision/Assistance  Patient can return home with the following A lot of help with bathing/dressing/bathroom;Assist for transportation;Assistance with cooking/housework    Functional Status Assessment  Patient has had a recent decline in their functional status and demonstrates the ability to make significant improvements in function in a reasonable and predictable amount of time.  Equipment Recommendations  None recommended by OT    Recommendations for Other Services       Precautions / Restrictions Precautions Precautions: Fall Restrictions Weight Bearing Restrictions: No      Mobility Bed Mobility               General bed mobility comments: in chair    Transfers Overall transfer level: Needs assistance Equipment used: None Transfers: Sit to/from Stand Sit to Stand: Supervision            General transfer comment: Assist for safety/lines      Balance Overall balance assessment: Needs assistance   Sitting balance-Leahy Scale: Normal Sitting balance - Comments: no LOB donning socks   Standing balance support: No upper extremity supported, During functional activity Standing balance-Leahy Scale: Good                             ADL either performed or assessed with clinical judgement   ADL                                         General ADL Comments: set up to supervision     Vision Baseline Vision/History: 0 No visual deficits Ability to See in Adequate Light: 0 Adequate Patient Visual Report: No change from baseline       Perception     Praxis      Pertinent Vitals/Pain Pain Assessment Pain Assessment: No/denies pain     Hand Dominance Right   Extremity/Trunk Assessment Upper Extremity Assessment Upper Extremity Assessment: Overall WFL for tasks assessed (slow fine motor on L, but very similar to R)   Lower Extremity Assessment Lower Extremity Assessment: Defer to PT evaluation   Cervical / Trunk Assessment Cervical / Trunk Assessment: Normal   Communication Communication Communication: Expressive difficulties (may be baseline)   Cognition Arousal/Alertness: Awake/alert Behavior During Therapy: WFL for tasks assessed/performed Overall Cognitive Status: Within Functional Limits for tasks assessed  General Comments: Did not assess advanced skills     General Comments  vss    Exercises     Shoulder Instructions      Home Living Family/patient expects to be discharged to:: Private residence Living Arrangements: Children Available Help at Discharge: Family;Available PRN/intermittently Type of Home: House Home Access: Stairs to enter [employer] of Steps: 4-5 Entrance Stairs-Rails: Right Home Layout: Two level;Able to live on main level with  bedroom/bathroom     Bathroom Shower/Tub: Tub/shower unit   Bathroom Toilet: Standard     Home Equipment: Conservation officer, nature (2 wheels);BSC/3in1;Wheelchair - manual   Additional Comments: Equipment was wife's who recently passed away      Prior Functioning/Environment Prior Level of Function : Independent/Modified Independent;Driving;Working/employed             Mobility Comments: Works as Actuary:        OT Treatment/Interventions:      OT Goals(Current goals can be found in the care plan section)    OT Frequency:      Co-evaluation              AM-PAC OT "6 Clicks" Daily Activity     Outcome Measure Help from another person eating meals?: None Help from another person taking care of personal grooming?: None Help from another person toileting, which includes using toliet, bedpan, or urinal?: None Help from another person bathing (including washing, rinsing, drying)?: None Help from another person to put on and taking off regular upper body clothing?: None Help from another person to put on and taking off regular lower body clothing?: None 6 Click Score: 24   End of Session    Activity Tolerance:   Patient left: Other (comment) (walking with PT)  OT Visit Diagnosis: Muscle weakness (generalized) (M62.81)                Time: 1346-1400 OT Time Calculation (min): 14 min Charges:  OT General Charges $OT Visit: 1 Visit OT Evaluation $OT Eval Low Complexity: [place], OTR/L Acute Rehabilitation Services Office: [phone]  Malka So 12/24/2021, 3:16 PM

## 2021-12-24 NOTE — Progress Notes (Signed)
Inpatient Diabetes Program Recommendations  AACE/ADA: New Consensus Statement on Inpatient Glycemic Control (2015)  Target Ranges:  Prepandial:   less than 140 mg/dL      Peak postprandial:   less than 180 mg/dL (1-2 hours)      Critically ill patients:  140 - 180 mg/dL   Lab Results  Component Value Date   GLUCAP 260 (H) 12/24/2021   HGBA1C 7.2 (H) 12/23/2021    Review of Glycemic Control  Latest Reference Range & Units 12/23/21 12:25 12/23/21 17:10 12/23/21 21:03 12/24/21 07:18  Glucose-Capillary 70 - 99 mg/dL 165 (H) 220 (H) 201 (H) 260 (H)   Diabetes history: DM 2 Outpatient Diabetes medications:  Jardiance 25 mg daily, Amaryl 2 mg daily, Novolog 6-16 units tid with meals, Lantus 40 units daily Current orders for Inpatient glycemic control:  Novolog 0-15 units tid with meals and HS  Inpatient Diabetes Program Recommendations:    Please add a portion of patient's home dose of basal insulin. Consider adding Semglee 32 units daily plus Novolog 3 units tid with meals (hold if patient eats less than 50% or NPO).   Thanks,  Adah Perl, RN, BC-ADM Inpatient Diabetes Coordinator Pager [phone]  (8a-5p)

## 2021-12-24 NOTE — Plan of Care (Signed)

## 2021-12-26 ENCOUNTER — Encounter: Payer: Self-pay | Admitting: Hematology

## 2022-01-30 HISTORY — PX: LUNG BIOPSY: SHX232

## 2022-02-04 NOTE — Progress Notes (Unsigned)
[facility] [address] [place] [address] [phone]       HOSPITAL FOLLOW UP NOTE  Mr. Meiko Stranahan Date of Birth:  Sep 10, 1955 Medical Record Number:  [id number]   Reason for Referral:  hospital stroke follow up    SUBJECTIVE:   CHIEF COMPLAINT:  No chief complaint on file.   HPI:   Zebadiah Willert is a 66 y.o. who  has a past medical history of Arthritis, Cancer ([place]), Chronic kidney disease, Diabetes mellitus, Dyspnea, Hypertension, and Pneumonia.  Patient presented to [facility] on 12/22/2021 with chest pain, left leg weakness, left facial droop and slurred speech. LKW 11am. CT, CTA and MRI were unremarkable. He was transferred to [facility] and given TNK. He was started on Plavix and asa 81mg  daily for three weeks then asa alone. Rosuvastatin was increased from 10 to 20mg  daily. No PT/OT/ST recommended. Personally reviewed hospitalization pertinent progress notes, lab work and imaging. Evaluated by Dr Erlinda Hong.   He was discharged home 12/24/2021. He reports doing well from a neurologic standpoint. No deficits post TIA/CVA. He returned to work and was doing well. He had surveillance PET scan that revealed right upper lobe mass concerning for recurrent neoplasm. Biopsy last week confirmed. He is scheduled to see oncology with [facility] tomorrow to discuss treatment options. He is s/p chemo and radiation in 2020.    PERTINENT IMAGING/LABS  CT no acute abnormality CT head and neck left ICA 70% stenosis MRI showed no acute intracranial process and no evidence of acute/subacute infarct. 2D Echo EF more than 75%, no PFO on bubble study  A1C Lab Results  Component Value Date   HGBA1C 7.2 (H) 12/23/2021    Lipid Panel     Component Value Date/Time   CHOL 156 12/24/2021 0530   TRIG 200 (H) 12/24/2021 0530   HDL 36 (L) 12/24/2021 0530   CHOLHDL 4.3 12/24/2021 0530   VLDL 40 12/24/2021 0530   LDLCALC 80 12/24/2021 0530      ROS:   14 system review of systems  performed and negative with exception of those listed in HPI  PMH:  Past Medical History:  Diagnosis Date   Arthritis    Cancer ([place])    lung   Chronic kidney disease    Diabetes mellitus    TYPE II   Dyspnea    at times   Hypertension    Pneumonia    "years ago"    PSH:  Past Surgical History:  Procedure Laterality Date   COLONOSCOPY W/ POLYPECTOMY     IR IMAGING GUIDED PORT INSERTION  05/05/2018   NASAL FRACTURE SURGERY     VIDEO BRONCHOSCOPY WITH ENDOBRONCHIAL ULTRASOUND Right 04/20/2018   Procedure: VIDEO BRONCHOSCOPY WITH ENDOBRONCHIAL ULTRASOUND;  Surgeon: Juanito Doom, MD;  Location: [facility] OR;  Service: Thoracic;  Laterality: Right;    Social History:  Social History   Socioeconomic History   Marital status: Single    Spouse name: Not on file   Number of children: Not on file   Years of education: Not on file   Highest education level: Not on file  Occupational History   Not on file  Tobacco Use   Smoking status: Former    Packs/day: 1.00    Years: 45.00    Total pack years: 45.00    Types: Cigarettes    Quit date: 03/31/2018    Years since quitting: 3.8   Smokeless tobacco: Former    Types: Chew    Quit date:  03/25/1973  Vaping Use   Vaping Use: Never used  Substance and Sexual Activity   Alcohol use: Not Currently   Drug use: No   Sexual activity: Not on file  Other Topics Concern   Not on file  Social History Narrative   Not on file   Social Determinants of Health   Financial Resource Strain: Not on file  Food Insecurity: Not on file  Transportation Needs: No Transportation Needs (04/30/2018)   PRAPARE - Transportation    Lack of Transportation (Medical): No    Lack of Transportation (Non-Medical): No  Physical Activity: Not on file  Stress: Not on file  Social Connections: Not on file  Intimate Partner Violence: Not on file    Family History:  Family History  Problem Relation Age of Onset   Leukemia Sister    Cancer Brother     Diabetes Mother     Medications:   Current Outpatient Medications on File Prior to Visit  Medication Sig Dispense Refill   albuterol (VENTOLIN HFA) 108 (90 Base) MCG/ACT inhaler Inhale 2 puffs into the lungs every 6 (six) hours as needed for wheezing or shortness of breath. 1 Inhaler 5   amitriptyline (ELAVIL) 50 MG tablet Take 50 mg by mouth at bedtime.     amLODipine (NORVASC) 5 MG tablet Take 5 mg by mouth daily.     aspirin EC 81 MG tablet Take 1 tablet (81 mg total) by mouth daily. Swallow whole. 30 tablet 12   budesonide-formoterol (SYMBICORT) 160-4.5 MCG/ACT inhaler Inhale 2 puffs into the lungs 2 (two) times daily. 1 Inhaler 12   clopidogrel (PLAVIX) 75 MG tablet Take 1 tablet (75 mg total) by mouth daily. 19 tablet 0   DULoxetine (CYMBALTA) 30 MG capsule Take 30 mg by mouth daily.     empagliflozin (JARDIANCE) 25 MG TABS tablet Take 1 tablet (25 mg total) by mouth daily. 30 tablet    famotidine (PEPCID) 20 MG tablet Take 20 mg by mouth daily.     gabapentin (NEURONTIN) 300 MG capsule Take 2 capsules (600 mg total) by mouth 2 (two) times daily. (Patient taking differently: Take 600 mg by mouth 3 (three) times daily.) 120 capsule 0   glimepiride (AMARYL) 2 MG tablet Take 2 mg by mouth daily with breakfast.      glucose blood test strip 4x a day 150 each 12   hydrALAZINE (APRESOLINE) 50 MG tablet Take 100 mg by mouth 2 (two) times daily.     insulin aspart (NOVOLOG) 100 UNIT/ML FlexPen Inject 6-16 Units into the skin See admin instructions. BS 180-200 add extra 2 units BS 201-250 add 5 units BS 251-300 add 7 units 301-350 add 10 units 351-400 add 12 units  bs >400 call PCP     insulin glargine-yfgn (SEMGLEE) 100 UNIT/ML injection Inject 0.32 mLs (32 Units total) into the skin daily. 10 mL 11   Insulin Pen Needle (BD PEN NEEDLE NANO U/F) 32G X 4 MM MISC 4x daily 150 each 6   ketoconazole (NIZORAL) 2 % cream Apply 1 application topically 2 (two) times daily as needed for irritation.      lidocaine (LIDODERM) 5 % Place 1 patch onto the skin daily. Remove & Discard patch within 12 hours or as directed by MD 30 patch 0   magnesium oxide (MAG-OX) 400 (241.3 Mg) MG tablet Take 400 mg by mouth 2 (two) times daily.     metoprolol succinate (TOPROL-XL) 100 MG 24 hr tablet Take 100 mg by  mouth 2 (two) times daily.     pantoprazole (PROTONIX) 40 MG tablet Take 40 mg by mouth daily.     potassium chloride (KLOR-CON) 10 MEQ tablet Take 1 tablet (10 mEq total) by mouth daily.     rosuvastatin (CRESTOR) 20 MG tablet Take 1 tablet (20 mg total) by mouth daily. 30 tablet 1   tizanidine (ZANAFLEX) 2 MG capsule Take 1 capsule (2 mg total) by mouth 3 (three) times daily as needed for muscle spasms. 24 capsule 0   torsemide (DEMADEX) 20 MG tablet Take 1 tablet (20 mg total) by mouth 2 (two) times daily.     traMADol (ULTRAM) 50 MG tablet Take 1 tablet (50 mg total) by mouth every 8 (eight) hours as needed for moderate pain. 60 tablet 1   TRELEGY ELLIPTA 100-62.5-25 MCG/INH AEPB Inhale 1 puff into the lungs daily.     valsartan (DIOVAN) 320 MG tablet Take 320 mg by mouth daily.     Vitamin D, Ergocalciferol, (DRISDOL) 1.25 MG (50000 UNIT) CAPS capsule Take 50,000 Units by mouth every Monday.     [DISCONTINUED] prochlorperazine (COMPAZINE) 10 MG tablet Take 1 tablet (10 mg total) by mouth every 6 (six) hours as needed (Nausea or vomiting). 30 tablet 1   Current Facility-Administered Medications on File Prior to Visit  Medication Dose Route Frequency Provider Last Rate Last Admin   magnesium sulfate 2 g in sodium chloride 0.9 % 250 mL  2 g Intravenous Once Tish Men, MD        Allergies:  No Known Allergies    OBJECTIVE:  Physical Exam  There were no vitals filed for this visit. There is no height or weight on file to calculate BMI. No results found.      No data to display           General: well developed, well nourished, seated, in no evident distress Head: head normocephalic and  atraumatic.   Neck: supple with no carotid or supraclavicular bruits Cardiovascular: regular rate and rhythm, no murmurs Musculoskeletal: no deformity Skin:  no rash/petichiae Vascular:  Normal pulses all extremities   Neurologic Exam Mental Status: Awake and fully alert.  Fluent speech and language.  Oriented to place and time. Recent and remote memory intact. Attention span, concentration and fund of knowledge appropriate. Mood and affect appropriate.  Cranial Nerves: Fundoscopic exam reveals sharp disc margins. Pupils equal, briskly reactive to light. Extraocular movements full without nystagmus. Visual fields full to confrontation. Hearing intact. Facial sensation intact. Face, tongue, palate moves normally and symmetrically.  Motor: Normal bulk and tone. Normal strength in all tested extremity muscles Sensory.: intact to touch , pinprick , position and vibratory sensation.  Coordination: Rapid alternating movements normal in all extremities. Finger-to-nose and heel-to-shin performed accurately bilaterally. Gait and Station: Arises from chair without difficulty. Stance is normal. Gait demonstrates normal stride length and balance with no assistive device. Reflexes: 1+ and symmetric.    NIHSS  0 Modified Rankin  0    ASSESSMENT: Darel Ricketts is a 66 y.o. year old male presented to [facility] on 12/22/2021 with chest pain, left leg weakness, left facial droop and slurred speech. Vascular risk factors include HTN, HLD, previous smoker, lung cancer.    PLAN:  Possible TIA : Residual deficit: none. Continue aspirin 81 mg daily  and rosuvastatin 20mg  daily for secondary stroke prevention.  Discussed secondary stroke prevention measures and importance of close PCP follow up for aggressive stroke risk factor management. I have gone  over the pathophysiology of stroke, warning signs and symptoms, risk factors and their management in some detail with instructions to go to the closest emergency room  for symptoms of concern. HTN: BP goal <130/90. Elevated today but usually well managed. Continue amlodipine, metoprolol, hydralazine and torsemide as directed by PCP.  HLD: LDL goal <70. Recent LDL 80, triglycerides 200. Continue rosuvastatin 20mg  daily per PCP.  DMII: A1c goal<7.0. Recent A1c 7.2. Continue glimepiride and Jardiance as directed by PCP.   Recurrent lung cancer: follow up closely with oncology.    Follow up as needed   CC:  [facility] provider: Dr. Leonie Man PCP: Debroah Loop, PA-C    I spent 45 minutes of face-to-face and non-face-to-face time with patient.  This included previsit chart review including review of recent hospitalization, lab review, study review, order entry, electronic health record documentation, patient education regarding recent stroke including etiology, secondary stroke prevention measures and importance of managing stroke risk factors, residual deficits and typical recovery time and answered all other questions to patient satisfaction   Debbora Presto, [facility]  [facility] [address] [place], [place] [address]  Phone [phone] Fax [phone] Note: This document was prepared with digital dictation and possible smart phrase technology. Any transcriptional errors that result from this process are unintentional.

## 2022-02-05 ENCOUNTER — Encounter: Payer: Self-pay | Admitting: Family Medicine

## 2022-02-05 ENCOUNTER — Ambulatory Visit: Payer: Medicare PPO | Admitting: Family Medicine

## 2022-02-05 VITALS — BP 165/88 | HR 85 | Ht 70.0 in | Wt 196.6 lb

## 2022-02-05 DIAGNOSIS — I639 Cerebral infarction, unspecified: Secondary | ICD-10-CM

## 2022-02-05 NOTE — Patient Instructions (Signed)
Below is our plan:  Possible TIA : Residual deficit: none. Continue aspirin 81 mg daily  and rosuvastatin 20mg  daily for secondary stroke prevention.  Discussed secondary stroke prevention measures and importance of close PCP follow up for aggressive stroke risk factor management. I have gone over the pathophysiology of stroke, warning signs and symptoms, risk factors and their management in some detail with instructions to go to the closest emergency room for symptoms of concern. HTN: BP goal <130/90. Elevated today but usually well managed. Continue amlodipine, metoprolol, hydralazine and torsemide as directed by PCP.  HLD: LDL goal <70. Recent LDL 80, triglycerides 200. Continue rosuvastatin 20mg  daily per PCP.  DMII: A1c goal<7.0. Recent A1c 7.2. Continue glimepiride and Jardiance as directed by PCP.   Recurrent lung cancer: follow up closely with oncology.   Please make sure you are staying well hydrated. I recommend 50-60 ounces daily. Well balanced diet and regular exercise encouraged. Consistent sleep schedule with 6-8 hours recommended.   Please continue follow up with care team as directed.   Follow up with neurology as needed   You may receive a survey regarding today's visit. I encourage you to leave honest feed back as I do use this information to improve patient care. Thank you for seeing me today!

## 2022-12-24 DEATH — deceased
